# Patient Record
Sex: Female | Born: 1940 | Race: White | Hispanic: No | Smoking: Former smoker
Health system: Southern US, Community
[De-identification: ages and names within clinical notes are randomized; demographics above are authoritative.]

## PROBLEM LIST (undated history)

## (undated) DIAGNOSIS — L299 Pruritus, unspecified: Secondary | ICD-10-CM

## (undated) DIAGNOSIS — D649 Anemia, unspecified: Secondary | ICD-10-CM

## (undated) DIAGNOSIS — C3490 Malignant neoplasm of unspecified part of unspecified bronchus or lung: Secondary | ICD-10-CM

## (undated) DIAGNOSIS — E538 Deficiency of other specified B group vitamins: Secondary | ICD-10-CM

## (undated) DIAGNOSIS — C9110 Chronic lymphocytic leukemia of B-cell type not having achieved remission: Secondary | ICD-10-CM

## (undated) HISTORY — DX: Pruritus, unspecified: L29.9

## (undated) HISTORY — DX: Malignant neoplasm of unspecified part of unspecified bronchus or lung: C34.90

## (undated) HISTORY — DX: Anemia, unspecified: D64.9

## (undated) HISTORY — DX: Chronic lymphocytic leukemia of B-cell type not having achieved remission: C91.10

## (undated) HISTORY — DX: Deficiency of other specified B group vitamins: E53.8

---

## 1986-07-16 HISTORY — PX: OTHER SURGICAL HISTORY: SHX169

## 2000-07-16 DIAGNOSIS — C9110 Chronic lymphocytic leukemia of B-cell type not having achieved remission: Secondary | ICD-10-CM

## 2000-07-16 HISTORY — DX: Chronic lymphocytic leukemia of B-cell type not having achieved remission: C91.10

## 2004-04-25 ENCOUNTER — Ambulatory Visit: Payer: Self-pay | Admitting: Internal Medicine

## 2004-05-16 ENCOUNTER — Ambulatory Visit: Payer: Self-pay | Admitting: Internal Medicine

## 2004-08-10 ENCOUNTER — Ambulatory Visit: Payer: Self-pay | Admitting: Family Medicine

## 2004-10-25 ENCOUNTER — Ambulatory Visit: Payer: Self-pay | Admitting: Internal Medicine

## 2004-11-13 ENCOUNTER — Ambulatory Visit: Payer: Self-pay | Admitting: Internal Medicine

## 2005-04-25 ENCOUNTER — Ambulatory Visit: Payer: Self-pay | Admitting: Internal Medicine

## 2005-05-16 ENCOUNTER — Ambulatory Visit: Payer: Self-pay | Admitting: Internal Medicine

## 2005-10-11 ENCOUNTER — Ambulatory Visit: Payer: Self-pay | Admitting: Family Medicine

## 2005-10-31 ENCOUNTER — Ambulatory Visit: Payer: Self-pay | Admitting: Internal Medicine

## 2005-11-13 ENCOUNTER — Ambulatory Visit: Payer: Self-pay | Admitting: Internal Medicine

## 2006-02-05 ENCOUNTER — Ambulatory Visit: Payer: Self-pay | Admitting: Family Medicine

## 2006-04-29 ENCOUNTER — Ambulatory Visit: Payer: Self-pay | Admitting: Internal Medicine

## 2006-05-16 ENCOUNTER — Ambulatory Visit: Payer: Self-pay | Admitting: Internal Medicine

## 2006-10-23 ENCOUNTER — Ambulatory Visit: Payer: Self-pay | Admitting: Internal Medicine

## 2006-11-14 ENCOUNTER — Ambulatory Visit: Payer: Self-pay | Admitting: Internal Medicine

## 2007-01-31 ENCOUNTER — Ambulatory Visit: Payer: Self-pay | Admitting: Family Medicine

## 2007-04-21 ENCOUNTER — Ambulatory Visit: Payer: Self-pay | Admitting: Internal Medicine

## 2007-05-07 ENCOUNTER — Ambulatory Visit: Payer: Self-pay | Admitting: Family Medicine

## 2007-05-17 ENCOUNTER — Ambulatory Visit: Payer: Self-pay | Admitting: Internal Medicine

## 2007-07-29 ENCOUNTER — Ambulatory Visit: Payer: Self-pay | Admitting: Internal Medicine

## 2007-08-17 ENCOUNTER — Ambulatory Visit: Payer: Self-pay | Admitting: Internal Medicine

## 2007-10-15 ENCOUNTER — Ambulatory Visit: Payer: Self-pay | Admitting: Internal Medicine

## 2007-10-28 ENCOUNTER — Ambulatory Visit: Payer: Self-pay | Admitting: Internal Medicine

## 2007-11-14 ENCOUNTER — Ambulatory Visit: Payer: Self-pay | Admitting: Internal Medicine

## 2008-03-16 ENCOUNTER — Ambulatory Visit: Payer: Self-pay | Admitting: Internal Medicine

## 2008-03-25 ENCOUNTER — Ambulatory Visit: Payer: Self-pay | Admitting: Internal Medicine

## 2008-04-15 ENCOUNTER — Ambulatory Visit: Payer: Self-pay | Admitting: Internal Medicine

## 2008-05-16 ENCOUNTER — Ambulatory Visit: Payer: Self-pay | Admitting: Internal Medicine

## 2008-06-15 ENCOUNTER — Ambulatory Visit: Payer: Self-pay | Admitting: Internal Medicine

## 2008-07-16 ENCOUNTER — Ambulatory Visit: Payer: Self-pay | Admitting: Internal Medicine

## 2008-08-16 ENCOUNTER — Ambulatory Visit: Payer: Self-pay | Admitting: Internal Medicine

## 2008-09-13 ENCOUNTER — Ambulatory Visit: Payer: Self-pay | Admitting: Internal Medicine

## 2008-10-14 ENCOUNTER — Ambulatory Visit: Payer: Self-pay | Admitting: Internal Medicine

## 2008-11-13 ENCOUNTER — Ambulatory Visit: Payer: Self-pay | Admitting: Internal Medicine

## 2008-12-14 ENCOUNTER — Ambulatory Visit: Payer: Self-pay | Admitting: Internal Medicine

## 2009-01-13 ENCOUNTER — Ambulatory Visit: Payer: Self-pay | Admitting: Internal Medicine

## 2009-02-13 ENCOUNTER — Ambulatory Visit: Payer: Self-pay | Admitting: Internal Medicine

## 2009-03-16 ENCOUNTER — Ambulatory Visit: Payer: Self-pay | Admitting: Internal Medicine

## 2009-04-15 ENCOUNTER — Ambulatory Visit: Payer: Self-pay | Admitting: Internal Medicine

## 2009-05-16 ENCOUNTER — Ambulatory Visit: Payer: Self-pay | Admitting: Internal Medicine

## 2009-06-15 ENCOUNTER — Ambulatory Visit: Payer: Self-pay | Admitting: Internal Medicine

## 2009-07-16 ENCOUNTER — Ambulatory Visit: Payer: Self-pay | Admitting: Internal Medicine

## 2009-08-16 ENCOUNTER — Ambulatory Visit: Payer: Self-pay | Admitting: Internal Medicine

## 2009-09-13 ENCOUNTER — Ambulatory Visit: Payer: Self-pay | Admitting: Internal Medicine

## 2009-10-14 ENCOUNTER — Ambulatory Visit: Payer: Self-pay | Admitting: Internal Medicine

## 2009-11-13 ENCOUNTER — Ambulatory Visit: Payer: Self-pay | Admitting: Internal Medicine

## 2009-12-14 ENCOUNTER — Ambulatory Visit: Payer: Self-pay | Admitting: Internal Medicine

## 2010-01-13 ENCOUNTER — Ambulatory Visit: Payer: Self-pay | Admitting: Internal Medicine

## 2010-02-13 ENCOUNTER — Ambulatory Visit: Payer: Self-pay | Admitting: Internal Medicine

## 2010-03-16 ENCOUNTER — Ambulatory Visit: Payer: Self-pay | Admitting: Internal Medicine

## 2010-04-15 ENCOUNTER — Ambulatory Visit: Payer: Self-pay | Admitting: Internal Medicine

## 2010-05-16 ENCOUNTER — Ambulatory Visit: Payer: Self-pay | Admitting: Internal Medicine

## 2010-06-15 ENCOUNTER — Ambulatory Visit: Payer: Self-pay | Admitting: Internal Medicine

## 2010-07-16 ENCOUNTER — Ambulatory Visit: Payer: Self-pay | Admitting: Internal Medicine

## 2010-08-16 ENCOUNTER — Ambulatory Visit: Payer: Self-pay | Admitting: Internal Medicine

## 2010-09-14 ENCOUNTER — Ambulatory Visit: Payer: Self-pay | Admitting: Internal Medicine

## 2010-10-15 ENCOUNTER — Ambulatory Visit: Payer: Self-pay | Admitting: Internal Medicine

## 2010-11-14 ENCOUNTER — Ambulatory Visit: Payer: Self-pay | Admitting: Internal Medicine

## 2010-12-15 ENCOUNTER — Ambulatory Visit: Payer: Self-pay | Admitting: Internal Medicine

## 2011-01-14 ENCOUNTER — Ambulatory Visit: Payer: Self-pay | Admitting: Internal Medicine

## 2011-02-14 ENCOUNTER — Ambulatory Visit: Payer: Self-pay | Admitting: Internal Medicine

## 2011-03-17 ENCOUNTER — Ambulatory Visit: Payer: Self-pay | Admitting: Internal Medicine

## 2011-04-16 ENCOUNTER — Ambulatory Visit: Payer: Self-pay | Admitting: Internal Medicine

## 2011-05-17 ENCOUNTER — Ambulatory Visit: Payer: Self-pay | Admitting: Internal Medicine

## 2011-06-16 ENCOUNTER — Ambulatory Visit: Payer: Self-pay | Admitting: Internal Medicine

## 2011-07-17 ENCOUNTER — Ambulatory Visit: Payer: Self-pay | Admitting: Internal Medicine

## 2011-07-26 LAB — BASIC METABOLIC PANEL
Anion Gap: 5 — ABNORMAL LOW (ref 7–16)
BUN: 19 mg/dL — ABNORMAL HIGH (ref 7–18)
EGFR (African American): >60
EGFR (Non-African Amer.): >60
Glucose: 115 mg/dL — ABNORMAL HIGH (ref 65–99)
Osmolality: 286 (ref 275–301)
Potassium: 4.4 mmol/L (ref 3.5–5.1)

## 2011-07-26 LAB — CBC CANCER CENTER
Basophil #: 0 x10 3/mm (ref 0.0–0.1)
Eosinophil #: 0 x10 3/mm (ref 0.0–0.7)
Lymphocyte #: 4.5 x10 3/mm — ABNORMAL HIGH (ref 1.0–3.6)
Lymphocyte %: 74.4 %
Monocyte #: 0.3 x10 3/mm (ref 0.0–0.7)
Monocyte %: 4.5 %
Neutrophil #: 1.2 x10 3/mm — ABNORMAL LOW (ref 1.4–6.5)
Platelet: 92 x10 3/mm — ABNORMAL LOW (ref 150–440)
RDW: 14.9 % — ABNORMAL HIGH (ref 11.5–14.5)
WBC: 6 x10 3/mm (ref 3.6–11.0)

## 2011-08-09 LAB — CBC CANCER CENTER
Basophil #: 0 x10 3/mm (ref 0.0–0.1)
Basophil %: 0.8 %
HCT: 39.5 % (ref 35.0–47.0)
HGB: 12.9 g/dL (ref 12.0–16.0)
Lymphocyte #: 4.1 x10 3/mm — ABNORMAL HIGH (ref 1.0–3.6)
Lymphocyte %: 74.8 %
MCHC: 32.6 g/dL (ref 32.0–36.0)
Monocyte %: 4.8 %
Neutrophil #: 1 x10 3/mm — ABNORMAL LOW (ref 1.4–6.5)
Neutrophil %: 19 %
RBC: 3.82 10*6/uL (ref 3.80–5.20)
WBC: 5.4 x10 3/mm (ref 3.6–11.0)

## 2011-08-17 ENCOUNTER — Ambulatory Visit: Payer: Self-pay | Admitting: Internal Medicine

## 2011-08-23 LAB — CBC CANCER CENTER
Basophil %: 0.1 %
Eosinophil #: 0 x10 3/mm (ref 0.0–0.7)
Eosinophil %: 0.6 %
HCT: 40.2 % (ref 35.0–47.0)
Lymphocyte #: 3.1 x10 3/mm (ref 1.0–3.6)
MCH: 32.8 pg (ref 26.0–34.0)
MCV: 101.9 fL — ABNORMAL HIGH (ref 80–100)
Monocyte #: 0.3 x10 3/mm (ref 0.0–0.7)
Monocyte %: 9.3 %
Neutrophil #: 0.1 x10 3/mm — ABNORMAL LOW (ref 1.4–6.5)
Platelet: 98 x10 3/mm — ABNORMAL LOW (ref 150–440)
RBC: 3.94 10*6/uL (ref 3.80–5.20)
RDW: 12.6 % (ref 11.5–14.5)
WBC: 3.5 x10 3/mm — ABNORMAL LOW (ref 3.6–11.0)

## 2011-08-30 LAB — CBC CANCER CENTER
Basophil %: 0.3 %
Eosinophil %: 0.3 %
Lymphocyte #: 2.3 x10 3/mm (ref 1.0–3.6)
Lymphocyte %: 31.5 %
MCH: 32.8 pg (ref 26.0–34.0)
MCHC: 32.1 g/dL (ref 32.0–36.0)
MCV: 102 fL — ABNORMAL HIGH (ref 80–100)
Monocyte #: 0.3 x10 3/mm (ref 0.0–0.7)
Monocyte %: 3.7 %
Platelet: 98 x10 3/mm — ABNORMAL LOW (ref 150–440)
RDW: 12.6 % (ref 11.5–14.5)
WBC: 7.4 x10 3/mm (ref 3.6–11.0)

## 2011-09-06 LAB — CBC CANCER CENTER
Basophil #: 0 x10 3/mm (ref 0.0–0.1)
Eosinophil #: 0 x10 3/mm (ref 0.0–0.7)
Eosinophil %: 0.1 %
HCT: 38.7 % (ref 35.0–47.0)
HGB: 12.6 g/dL (ref 12.0–16.0)
Lymphocyte %: 61 %
MCHC: 32.7 g/dL (ref 32.0–36.0)
Monocyte #: 0.2 x10 3/mm (ref 0.0–0.7)
Monocyte %: 3.3 %
Neutrophil #: 1.9 x10 3/mm (ref 1.4–6.5)
Neutrophil %: 35.4 %
RBC: 3.88 10*6/uL (ref 3.80–5.20)
RDW: 12.4 % (ref 11.5–14.5)
WBC: 5.4 x10 3/mm (ref 3.6–11.0)

## 2011-09-14 ENCOUNTER — Ambulatory Visit: Payer: Self-pay | Admitting: Internal Medicine

## 2011-09-20 LAB — CBC CANCER CENTER
Basophil #: 0 x10 3/mm (ref 0.0–0.1)
Eosinophil #: 0.1 x10 3/mm (ref 0.0–0.7)
Lymphocyte #: 3.5 x10 3/mm (ref 1.0–3.6)
MCH: 32.6 pg (ref 26.0–34.0)
MCHC: 32.7 g/dL (ref 32.0–36.0)
MCV: 99.7 fL (ref 80–100)
Monocyte #: 0.4 x10 3/mm (ref 0.0–0.7)
Monocyte %: 6.3 %
Neutrophil #: 1.7 x10 3/mm (ref 1.4–6.5)
Neutrophil %: 29.6 %
Platelet: 111 x10 3/mm — ABNORMAL LOW (ref 150–440)
RBC: 3.94 10*6/uL (ref 3.80–5.20)
RDW: 13.4 % (ref 11.5–14.5)
WBC: 5.7 x10 3/mm (ref 3.6–11.0)

## 2011-10-04 LAB — BASIC METABOLIC PANEL
Anion Gap: 7 (ref 7–16)
BUN: 18 mg/dL (ref 7–18)
Calcium, Total: 9 mg/dL (ref 8.5–10.1)
Chloride: 105 mmol/L (ref 98–107)
Co2: 31 mmol/L (ref 21–32)
EGFR (African American): >60
EGFR (Non-African Amer.): >60
Osmolality: 287 (ref 275–301)
Potassium: 3.9 mmol/L (ref 3.5–5.1)
Sodium: 143 mmol/L (ref 136–145)

## 2011-10-04 LAB — CBC CANCER CENTER
Basophil #: 0 x10 3/mm (ref 0.0–0.1)
Basophil %: 0.3 %
Eosinophil #: 0.1 x10 3/mm (ref 0.0–0.7)
HGB: 13.2 g/dL (ref 12.0–16.0)
Lymphocyte #: 3.9 x10 3/mm — ABNORMAL HIGH (ref 1.0–3.6)
Lymphocyte %: 63.9 %
MCHC: 31.9 g/dL — ABNORMAL LOW (ref 32.0–36.0)
Neutrophil %: 30.8 %
RBC: 4.21 10*6/uL (ref 3.80–5.20)
WBC: 6.2 x10 3/mm (ref 3.6–11.0)

## 2011-10-04 LAB — HEPATIC FUNCTION PANEL A (ARMC)
Alkaline Phosphatase: 52 U/L (ref 50–136)
SGOT(AST): 17 U/L (ref 15–37)
Total Protein: 6.4 g/dL (ref 6.4–8.2)

## 2011-10-15 ENCOUNTER — Ambulatory Visit: Payer: Self-pay | Admitting: Internal Medicine

## 2011-10-18 LAB — CBC CANCER CENTER
Basophil %: 0.1 %
Lymphocyte #: 2.8 x10 3/mm (ref 1.0–3.6)
MCH: 32 pg (ref 26.0–34.0)
MCHC: 32.4 g/dL (ref 32.0–36.0)
MCV: 98.6 fL (ref 80–100)
Monocyte %: 5.5 %
RBC: 4.07 10*6/uL (ref 3.80–5.20)
RDW: 13.3 % (ref 11.5–14.5)

## 2011-11-14 ENCOUNTER — Ambulatory Visit: Payer: Self-pay | Admitting: Internal Medicine

## 2011-11-15 LAB — CBC CANCER CENTER
Basophil #: 0 x10 3/mm (ref 0.0–0.1)
Basophil %: 0.2 %
Eosinophil #: 0.1 x10 3/mm (ref 0.0–0.7)
HCT: 42.8 % (ref 35.0–47.0)
HGB: 13.9 g/dL (ref 12.0–16.0)
Lymphocyte #: 5.6 x10 3/mm — ABNORMAL HIGH (ref 1.0–3.6)
Lymphocyte %: 66.3 %
MCH: 32.1 pg (ref 26.0–34.0)
MCV: 99 fL (ref 80–100)
Monocyte #: 0.3 x10 3/mm (ref 0.2–0.9)
Neutrophil %: 29.4 %
Platelet: 104 x10 3/mm — ABNORMAL LOW (ref 150–440)
RDW: 14.2 % (ref 11.5–14.5)
WBC: 8.4 x10 3/mm (ref 3.6–11.0)

## 2011-12-13 LAB — BASIC METABOLIC PANEL
Anion Gap: 5 — ABNORMAL LOW (ref 7–16)
Chloride: 107 mmol/L (ref 98–107)
EGFR (Non-African Amer.): >60
Glucose: 93 mg/dL (ref 65–99)
Osmolality: 288 (ref 275–301)
Potassium: 5.2 mmol/L — ABNORMAL HIGH (ref 3.5–5.1)
Sodium: 144 mmol/L (ref 136–145)

## 2011-12-13 LAB — CBC CANCER CENTER
Basophil #: 0 x10 3/mm (ref 0.0–0.1)
Eosinophil #: 0.1 x10 3/mm (ref 0.0–0.7)
Eosinophil %: 1 %
HCT: 41.3 % (ref 35.0–47.0)
Lymphocyte #: 4.7 x10 3/mm — ABNORMAL HIGH (ref 1.0–3.6)
MCHC: 32.5 g/dL (ref 32.0–36.0)
MCV: 99 fL (ref 80–100)
WBC: 5.7 x10 3/mm (ref 3.6–11.0)

## 2011-12-13 LAB — HEPATIC FUNCTION PANEL A (ARMC)
Alkaline Phosphatase: 63 U/L (ref 50–136)
Bilirubin, Direct: 0.1 mg/dL (ref 0.00–0.20)
Bilirubin,Total: 0.6 mg/dL (ref 0.2–1.0)
SGOT(AST): 20 U/L (ref 15–37)
SGPT (ALT): 24 U/L
Total Protein: 6.6 g/dL (ref 6.4–8.2)

## 2011-12-15 ENCOUNTER — Ambulatory Visit: Payer: Self-pay | Admitting: Internal Medicine

## 2011-12-20 LAB — CBC CANCER CENTER
Basophil #: 0 x10 3/mm (ref 0.0–0.1)
Basophil %: 0.1 %
Eosinophil #: 0 x10 3/mm (ref 0.0–0.7)
HGB: 13.4 g/dL (ref 12.0–16.0)
Lymphocyte #: 3 x10 3/mm (ref 1.0–3.6)
Lymphocyte %: 75.1 %
MCH: 31.9 pg (ref 26.0–34.0)
MCHC: 32.7 g/dL (ref 32.0–36.0)
MCV: 98 fL (ref 80–100)
Monocyte %: 10.6 %
Neutrophil #: 0.5 x10 3/mm — ABNORMAL LOW (ref 1.4–6.5)
Platelet: 96 x10 3/mm — ABNORMAL LOW (ref 150–440)
RBC: 4.2 10*6/uL (ref 3.80–5.20)
WBC: 3.9 x10 3/mm (ref 3.6–11.0)

## 2011-12-27 LAB — CBC CANCER CENTER
Basophil #: 0 x10 3/mm (ref 0.0–0.1)
Basophil %: 0.1 %
Eosinophil %: 0.7 %
HGB: 13.5 g/dL (ref 12.0–16.0)
Lymphocyte %: 64.3 %
MCH: 32.1 pg (ref 26.0–34.0)
MCV: 98 fL (ref 80–100)
Monocyte %: 6 %
Neutrophil #: 1.6 x10 3/mm (ref 1.4–6.5)
Neutrophil %: 28.9 %
Platelet: 100 x10 3/mm — ABNORMAL LOW (ref 150–440)
RBC: 4.19 10*6/uL (ref 3.80–5.20)
RDW: 13.9 % (ref 11.5–14.5)
WBC: 5.7 x10 3/mm (ref 3.6–11.0)

## 2012-01-10 LAB — CBC CANCER CENTER
Basophil %: 0.2 %
Eosinophil %: 2.2 %
HCT: 42.5 % (ref 35.0–47.0)
HGB: 13.9 g/dL (ref 12.0–16.0)
Lymphocyte #: 4.7 x10 3/mm — ABNORMAL HIGH (ref 1.0–3.6)
Lymphocyte %: 70.7 %
MCH: 31.6 pg (ref 26.0–34.0)
MCV: 97 fL (ref 80–100)
Monocyte #: 0.3 x10 3/mm (ref 0.2–0.9)
Monocyte %: 4.3 %
Neutrophil #: 1.5 x10 3/mm (ref 1.4–6.5)
Neutrophil %: 22.6 %
Platelet: 111 x10 3/mm — ABNORMAL LOW (ref 150–440)
RBC: 4.39 10*6/uL (ref 3.80–5.20)
RDW: 13.9 % (ref 11.5–14.5)

## 2012-01-14 ENCOUNTER — Ambulatory Visit: Payer: Self-pay | Admitting: Internal Medicine

## 2012-02-07 LAB — CBC CANCER CENTER
Basophil #: 0 x10 3/mm (ref 0.0–0.1)
Basophil %: 0.1 %
HCT: 41.7 % (ref 35.0–47.0)
HGB: 13.8 g/dL (ref 12.0–16.0)
Lymphocyte #: 4.5 x10 3/mm — ABNORMAL HIGH (ref 1.0–3.6)
Lymphocyte %: 54 %
MCV: 98 fL (ref 80–100)
Monocyte %: 4.9 %
Neutrophil #: 3.2 x10 3/mm (ref 1.4–6.5)
Platelet: 118 x10 3/mm — ABNORMAL LOW (ref 150–440)
RBC: 4.26 10*6/uL (ref 3.80–5.20)
RDW: 14.7 % — ABNORMAL HIGH (ref 11.5–14.5)
WBC: 8.3 x10 3/mm (ref 3.6–11.0)

## 2012-02-14 ENCOUNTER — Ambulatory Visit: Payer: Self-pay | Admitting: Internal Medicine

## 2012-03-06 LAB — CBC CANCER CENTER
Basophil #: 0 "x10 3/mm "
Basophil %: 0.3 %
Eosinophil #: 0.1 "x10 3/mm "
Eosinophil %: 1.5 %
HCT: 38.7 %
HGB: 12.6 g/dL
Lymphocyte %: 59.5 %
Lymphs Abs: 4.3 "x10 3/mm " — ABNORMAL HIGH
MCH: 32.2 pg
MCHC: 32.5 g/dL
MCV: 99 fL
Monocyte #: 0.3 "x10 3/mm "
Monocyte %: 3.9 %
Neutrophil #: 2.5 "x10 3/mm "
Neutrophil %: 34.8 %
Platelet: 125 "x10 3/mm " — ABNORMAL LOW
RBC: 3.91 "x10 6/mm "
RDW: 14.4 %
WBC: 7.3 "x10 3/mm "

## 2012-03-06 LAB — BASIC METABOLIC PANEL WITH GFR
Anion Gap: 7
BUN: 20 mg/dL — ABNORMAL HIGH
Calcium, Total: 8.6 mg/dL
Chloride: 106 mmol/L
Co2: 28 mmol/L
Creatinine: 0.78 mg/dL
EGFR (African American): >60
EGFR (Non-African Amer.): >60
Glucose: 90 mg/dL
Osmolality: 283
Potassium: 4.2 mmol/L
Sodium: 141 mmol/L

## 2012-03-06 LAB — HEPATIC FUNCTION PANEL A (ARMC)
Albumin: 3.8 g/dL
Alkaline Phosphatase: 63 U/L
Bilirubin, Direct: 0.1 mg/dL
Bilirubin,Total: 0.4 mg/dL
SGOT(AST): 15 U/L
SGPT (ALT): 20 U/L
Total Protein: 6.4 g/dL

## 2012-03-16 ENCOUNTER — Ambulatory Visit: Payer: Self-pay | Admitting: Internal Medicine

## 2012-04-03 LAB — CBC CANCER CENTER
Basophil #: 0 x10 3/mm (ref 0.0–0.1)
Eosinophil #: 0.1 x10 3/mm (ref 0.0–0.7)
HCT: 41.3 % (ref 35.0–47.0)
Lymphocyte #: 4.8 x10 3/mm — ABNORMAL HIGH (ref 1.0–3.6)
MCH: 32.2 pg (ref 26.0–34.0)
MCHC: 32.3 g/dL (ref 32.0–36.0)
MCV: 100 fL (ref 80–100)
Monocyte %: 4.6 %
Neutrophil #: 2.2 x10 3/mm (ref 1.4–6.5)
RDW: 15.1 % — ABNORMAL HIGH (ref 11.5–14.5)
WBC: 7.4 x10 3/mm (ref 3.6–11.0)

## 2012-04-15 ENCOUNTER — Ambulatory Visit: Payer: Self-pay | Admitting: Internal Medicine

## 2012-05-15 LAB — CBC CANCER CENTER
Basophil #: 0 x10 3/mm (ref 0.0–0.1)
Basophil %: 0.3 %
Eosinophil #: 0.1 x10 3/mm (ref 0.0–0.7)
HCT: 39.6 % (ref 35.0–47.0)
HGB: 13.1 g/dL (ref 12.0–16.0)
Lymphocyte #: 4.9 x10 3/mm — ABNORMAL HIGH (ref 1.0–3.6)
MCH: 32.4 pg (ref 26.0–34.0)
MCHC: 32.9 g/dL (ref 32.0–36.0)
MCV: 98 fL (ref 80–100)
Monocyte #: 0.3 x10 3/mm (ref 0.2–0.9)
Neutrophil #: 2.1 x10 3/mm (ref 1.4–6.5)
Platelet: 109 x10 3/mm — ABNORMAL LOW (ref 150–440)
RBC: 4.03 10*6/uL (ref 3.80–5.20)
RDW: 14.1 % (ref 11.5–14.5)
WBC: 7.5 x10 3/mm (ref 3.6–11.0)

## 2012-05-16 ENCOUNTER — Ambulatory Visit: Payer: Self-pay | Admitting: Internal Medicine

## 2012-06-25 ENCOUNTER — Ambulatory Visit: Payer: Self-pay | Admitting: Internal Medicine

## 2012-06-26 LAB — CBC CANCER CENTER
Basophil %: 0.3 %
Eosinophil %: 0.6 %
HCT: 38.9 % (ref 35.0–47.0)
HGB: 12.8 g/dL (ref 12.0–16.0)
Lymphocyte #: 4.5 x10 3/mm — ABNORMAL HIGH (ref 1.0–3.6)
Lymphocyte %: 62.2 %
MCH: 31.6 pg (ref 26.0–34.0)
MCV: 96 fL (ref 80–100)
Monocyte #: 0.3 x10 3/mm (ref 0.2–0.9)
Monocyte %: 4.1 %
Neutrophil %: 32.8 %
RBC: 4.05 10*6/uL (ref 3.80–5.20)
RDW: 13.7 % (ref 11.5–14.5)

## 2012-06-26 LAB — BASIC METABOLIC PANEL
Anion Gap: 7 (ref 7–16)
BUN: 17 mg/dL (ref 7–18)
Calcium, Total: 8.8 mg/dL (ref 8.5–10.1)
Chloride: 108 mmol/L — ABNORMAL HIGH (ref 98–107)
Co2: 29 mmol/L (ref 21–32)
Creatinine: 0.8 mg/dL (ref 0.60–1.30)
EGFR (African American): >60
Osmolality: 289 (ref 275–301)
Potassium: 4.1 mmol/L (ref 3.5–5.1)

## 2012-06-26 LAB — HEPATIC FUNCTION PANEL A (ARMC)
Alkaline Phosphatase: 70 U/L (ref 50–136)
Bilirubin, Direct: 0.1 mg/dL (ref 0.00–0.20)
Bilirubin,Total: 0.5 mg/dL (ref 0.2–1.0)
SGOT(AST): 16 U/L (ref 15–37)

## 2012-07-16 ENCOUNTER — Ambulatory Visit: Payer: Self-pay | Admitting: Internal Medicine

## 2012-08-07 LAB — CBC CANCER CENTER
Basophil %: 0.2 %
Eosinophil #: 0 x10 3/mm (ref 0.0–0.7)
Lymphocyte #: 5.7 x10 3/mm — ABNORMAL HIGH (ref 1.0–3.6)
Lymphocyte %: 68.1 %
MCHC: 32.8 g/dL (ref 32.0–36.0)
MCV: 95 fL (ref 80–100)
Monocyte #: 0.3 x10 3/mm (ref 0.2–0.9)
Monocyte %: 3.5 %
Neutrophil #: 2.3 x10 3/mm (ref 1.4–6.5)
Platelet: 125 x10 3/mm — ABNORMAL LOW (ref 150–440)
RBC: 4.4 10*6/uL (ref 3.80–5.20)
RDW: 13.6 % (ref 11.5–14.5)
WBC: 8.4 x10 3/mm (ref 3.6–11.0)

## 2012-08-16 ENCOUNTER — Ambulatory Visit: Payer: Self-pay | Admitting: Internal Medicine

## 2012-09-18 ENCOUNTER — Ambulatory Visit: Payer: Self-pay | Admitting: Internal Medicine

## 2012-09-18 LAB — BASIC METABOLIC PANEL
Anion Gap: 9 (ref 7–16)
BUN: 15 mg/dL (ref 7–18)
Calcium, Total: 8.7 mg/dL (ref 8.5–10.1)
Chloride: 108 mmol/L — ABNORMAL HIGH (ref 98–107)
Co2: 28 mmol/L (ref 21–32)
Creatinine: 0.81 mg/dL (ref 0.60–1.30)
EGFR (African American): >60
Glucose: 102 mg/dL — ABNORMAL HIGH (ref 65–99)
Osmolality: 290 (ref 275–301)
Potassium: 4.2 mmol/L (ref 3.5–5.1)
Sodium: 145 mmol/L (ref 136–145)

## 2012-09-18 LAB — CBC CANCER CENTER
Eosinophil #: 0 x10 3/mm (ref 0.0–0.7)
Eosinophil %: 0.6 %
HCT: 39.5 % (ref 35.0–47.0)
HGB: 13.4 g/dL (ref 12.0–16.0)
Lymphocyte #: 4 x10 3/mm — ABNORMAL HIGH (ref 1.0–3.6)
Lymphocyte %: 62.7 %
MCH: 32.5 pg (ref 26.0–34.0)
MCHC: 33.8 g/dL (ref 32.0–36.0)
MCV: 96 fL (ref 80–100)
Neutrophil #: 2.1 x10 3/mm (ref 1.4–6.5)
Neutrophil %: 32.3 %
RBC: 4.11 10*6/uL (ref 3.80–5.20)
RDW: 13.7 % (ref 11.5–14.5)
WBC: 6.4 x10 3/mm (ref 3.6–11.0)

## 2012-09-18 LAB — HEPATIC FUNCTION PANEL A (ARMC)
Alkaline Phosphatase: 57 U/L (ref 50–136)
Bilirubin, Direct: 0.1 mg/dL (ref 0.00–0.20)
Total Protein: 6.1 g/dL — ABNORMAL LOW (ref 6.4–8.2)

## 2012-10-14 ENCOUNTER — Ambulatory Visit: Payer: Self-pay | Admitting: Internal Medicine

## 2012-11-13 ENCOUNTER — Ambulatory Visit: Payer: Self-pay | Admitting: Internal Medicine

## 2012-11-13 LAB — CBC CANCER CENTER
Basophil #: 0 x10 3/mm (ref 0.0–0.1)
Eosinophil #: 0 x10 3/mm (ref 0.0–0.7)
Eosinophil %: 0.6 %
Lymphocyte #: 4.1 x10 3/mm — ABNORMAL HIGH (ref 1.0–3.6)
MCHC: 32.5 g/dL (ref 32.0–36.0)
MCV: 94 fL (ref 80–100)
Monocyte %: 4.6 %
Neutrophil #: 2.7 x10 3/mm (ref 1.4–6.5)
Neutrophil %: 37.4 %
RBC: 4.41 10*6/uL (ref 3.80–5.20)

## 2012-12-14 ENCOUNTER — Ambulatory Visit: Payer: Self-pay | Admitting: Internal Medicine

## 2012-12-18 LAB — CBC CANCER CENTER
Basophil #: 0.1 x10 3/mm (ref 0.0–0.1)
Basophil %: 1.5 %
Eosinophil #: 0.1 x10 3/mm (ref 0.0–0.7)
HCT: 39.8 % (ref 35.0–47.0)
HGB: 13 g/dL (ref 12.0–16.0)
Lymphocyte %: 51 %
MCH: 30.7 pg (ref 26.0–34.0)
MCHC: 32.5 g/dL (ref 32.0–36.0)
MCV: 94 fL (ref 80–100)
Monocyte #: 0.4 x10 3/mm (ref 0.2–0.9)
Neutrophil #: 3.5 x10 3/mm (ref 1.4–6.5)
Platelet: 114 x10 3/mm — ABNORMAL LOW (ref 150–440)
RBC: 4.22 10*6/uL (ref 3.80–5.20)
RDW: 13.7 % (ref 11.5–14.5)
WBC: 8.3 x10 3/mm (ref 3.6–11.0)

## 2012-12-18 LAB — HEPATIC FUNCTION PANEL A (ARMC)
Alkaline Phosphatase: 56 U/L (ref 50–136)
Bilirubin,Total: 0.4 mg/dL (ref 0.2–1.0)
SGOT(AST): 19 U/L (ref 15–37)
SGPT (ALT): 26 U/L (ref 12–78)

## 2012-12-18 LAB — CREATININE, SERUM: Creatinine: 0.73 mg/dL (ref 0.60–1.30)

## 2013-01-13 ENCOUNTER — Ambulatory Visit: Payer: Self-pay | Admitting: Internal Medicine

## 2013-01-29 LAB — CBC CANCER CENTER
Basophil %: 0.3 %
Eosinophil #: 0.1 x10 3/mm (ref 0.0–0.7)
Eosinophil %: 1.5 %
HGB: 14.1 g/dL (ref 12.0–16.0)
Lymphocyte #: 3.3 x10 3/mm (ref 1.0–3.6)
Lymphocyte %: 55.1 %
MCH: 31.1 pg (ref 26.0–34.0)
MCHC: 33.1 g/dL (ref 32.0–36.0)
MCV: 94 fL (ref 80–100)
Monocyte %: 6.4 %
Neutrophil #: 2.2 x10 3/mm (ref 1.4–6.5)
Neutrophil %: 36.7 %
Platelet: 127 x10 3/mm — ABNORMAL LOW (ref 150–440)
RBC: 4.53 10*6/uL (ref 3.80–5.20)
WBC: 6 x10 3/mm (ref 3.6–11.0)

## 2013-02-13 ENCOUNTER — Ambulatory Visit: Payer: Self-pay | Admitting: Internal Medicine

## 2013-03-12 LAB — HEPATIC FUNCTION PANEL A (ARMC)
Albumin: 3.5 g/dL (ref 3.4–5.0)
Alkaline Phosphatase: 63 U/L (ref 50–136)
Bilirubin, Direct: 0.1 mg/dL (ref 0.00–0.20)
Bilirubin,Total: 0.4 mg/dL (ref 0.2–1.0)
SGOT(AST): 15 U/L (ref 15–37)
SGPT (ALT): 22 U/L (ref 12–78)
Total Protein: 6.6 g/dL (ref 6.4–8.2)

## 2013-03-12 LAB — CREATININE, SERUM
Creatinine: 0.76 mg/dL (ref 0.60–1.30)
EGFR (African American): >60
EGFR (Non-African Amer.): >60

## 2013-03-12 LAB — CBC CANCER CENTER
Basophil #: 0 x10 3/mm (ref 0.0–0.1)
Basophil %: 0.1 %
Eosinophil %: 1.5 %
HCT: 35.4 % (ref 35.0–47.0)
Lymphocyte #: 2.2 x10 3/mm (ref 1.0–3.6)
Lymphocyte %: 68.1 %
MCH: 30.6 pg (ref 26.0–34.0)
Monocyte #: 0.3 x10 3/mm (ref 0.2–0.9)
Monocyte %: 9.8 %
Neutrophil #: 0.7 x10 3/mm — ABNORMAL LOW (ref 1.4–6.5)
Neutrophil %: 20.5 %
RBC: 3.8 10*6/uL (ref 3.80–5.20)
RDW: 13.6 % (ref 11.5–14.5)
WBC: 3.3 x10 3/mm — ABNORMAL LOW (ref 3.6–11.0)

## 2013-03-16 ENCOUNTER — Ambulatory Visit: Payer: Self-pay | Admitting: Internal Medicine

## 2013-03-17 LAB — CBC CANCER CENTER
Basophil #: 0 x10 3/mm (ref 0.0–0.1)
Eosinophil %: 0.2 %
HCT: 38.6 % (ref 35.0–47.0)
HGB: 12.6 g/dL (ref 12.0–16.0)
Lymphocyte #: 1.6 x10 3/mm (ref 1.0–3.6)
Lymphocyte %: 46.7 %
MCHC: 32.7 g/dL (ref 32.0–36.0)
Monocyte %: 23.7 %
Neutrophil #: 1 x10 3/mm — ABNORMAL LOW (ref 1.4–6.5)
Neutrophil %: 29.2 %
WBC: 3.4 x10 3/mm — ABNORMAL LOW (ref 3.6–11.0)

## 2013-03-17 LAB — URINALYSIS, COMPLETE
Bilirubin,UR: NEGATIVE
Glucose,UR: NEGATIVE mg/dL (ref 0–75)
Ketone: NEGATIVE
Nitrite: POSITIVE
Specific Gravity: 1.02 (ref 1.003–1.030)

## 2013-03-18 ENCOUNTER — Inpatient Hospital Stay: Payer: Self-pay | Admitting: Internal Medicine

## 2013-03-18 LAB — COMPREHENSIVE METABOLIC PANEL
Albumin: 3.1 g/dL — ABNORMAL LOW (ref 3.4–5.0)
Alkaline Phosphatase: 70 U/L (ref 50–136)
Anion Gap: 5 — ABNORMAL LOW (ref 7–16)
Chloride: 98 mmol/L (ref 98–107)
Co2: 28 mmol/L (ref 21–32)
Creatinine: 0.74 mg/dL (ref 0.60–1.30)
EGFR (Non-African Amer.): >60
Glucose: 114 mg/dL — ABNORMAL HIGH (ref 65–99)
Sodium: 131 mmol/L — ABNORMAL LOW (ref 136–145)

## 2013-03-18 LAB — CBC WITH DIFFERENTIAL/PLATELET
Basophil %: 0.1 %
Eosinophil #: 0 10*3/uL (ref 0.0–0.7)
HCT: 35.6 % (ref 35.0–47.0)
HGB: 12.1 g/dL (ref 12.0–16.0)
Lymphocyte #: 1.4 10*3/uL (ref 1.0–3.6)
Lymphocyte %: 19.1 %
MCHC: 33.9 g/dL (ref 32.0–36.0)
MCV: 91 fL (ref 80–100)
Monocyte #: 0.5 x10 3/mm (ref 0.2–0.9)
Monocyte %: 6.1 %
Neutrophil %: 74.7 %
Platelet: 141 10*3/uL — ABNORMAL LOW (ref 150–440)
RBC: 3.92 10*6/uL (ref 3.80–5.20)
WBC: 7.5 10*3/uL (ref 3.6–11.0)

## 2013-03-19 LAB — CBC WITH DIFFERENTIAL/PLATELET
Basophil #: 0 10*3/uL (ref 0.0–0.1)
Eosinophil #: 0 10*3/uL (ref 0.0–0.7)
Eosinophil %: 0 %
HCT: 31.5 % — ABNORMAL LOW (ref 35.0–47.0)
HGB: 10.5 g/dL — ABNORMAL LOW (ref 12.0–16.0)
Lymphocyte #: 0.8 10*3/uL — ABNORMAL LOW (ref 1.0–3.6)
Lymphocyte %: 21.1 %
MCH: 30.5 pg (ref 26.0–34.0)
MCV: 92 fL (ref 80–100)
Monocyte #: 0.3 x10 3/mm (ref 0.2–0.9)
Monocyte %: 8.4 %
Neutrophil #: 2.8 10*3/uL (ref 1.4–6.5)
Platelet: 93 10*3/uL — ABNORMAL LOW (ref 150–440)
WBC: 4 10*3/uL (ref 3.6–11.0)

## 2013-03-19 LAB — BASIC METABOLIC PANEL
Anion Gap: 4 — ABNORMAL LOW (ref 7–16)
BUN: 10 mg/dL (ref 7–18)
Co2: 27 mmol/L (ref 21–32)
Creatinine: 0.74 mg/dL (ref 0.60–1.30)
Osmolality: 270 (ref 275–301)

## 2013-03-19 LAB — URINE CULTURE

## 2013-03-20 LAB — PROTIME-INR
INR: 1.2
Prothrombin Time: 15.1 secs — ABNORMAL HIGH (ref 11.5–14.7)

## 2013-03-20 LAB — CBC WITH DIFFERENTIAL/PLATELET
Basophil #: 0 10*3/uL (ref 0.0–0.1)
Basophil %: 0.1 %
Eosinophil #: 0 10*3/uL (ref 0.0–0.7)
Eosinophil %: 0.6 %
HCT: 29.8 % — ABNORMAL LOW (ref 35.0–47.0)
Lymphocyte %: 36.6 %
MCH: 30.7 pg (ref 26.0–34.0)
MCHC: 33.9 g/dL (ref 32.0–36.0)
MCV: 91 fL (ref 80–100)
Monocyte #: 0.3 x10 3/mm (ref 0.2–0.9)
Monocyte %: 11.7 %
Neutrophil #: 1.4 10*3/uL (ref 1.4–6.5)
Neutrophil %: 51 %
Platelet: 100 10*3/uL — ABNORMAL LOW (ref 150–440)
RBC: 3.28 10*6/uL — ABNORMAL LOW (ref 3.80–5.20)
RDW: 13.6 % (ref 11.5–14.5)

## 2013-03-20 LAB — FIBRINOGEN: Fibrinogen: 690 mg/dL — ABNORMAL HIGH (ref 210–470)

## 2013-03-20 LAB — APTT: Activated PTT: 45.6 secs — ABNORMAL HIGH (ref 23.6–35.9)

## 2013-03-22 LAB — CULTURE, BLOOD (SINGLE)

## 2013-03-25 LAB — CULTURE, BLOOD (SINGLE)

## 2013-03-26 LAB — BASIC METABOLIC PANEL
Co2: 28 mmol/L (ref 21–32)
Creatinine: 0.64 mg/dL (ref 0.60–1.30)
EGFR (African American): >60
EGFR (Non-African Amer.): >60
Osmolality: 286 (ref 275–301)
Potassium: 4 mmol/L (ref 3.5–5.1)
Sodium: 144 mmol/L (ref 136–145)

## 2013-03-26 LAB — CBC CANCER CENTER
Basophil #: 0 x10 3/mm (ref 0.0–0.1)
Basophil %: 0.2 %
Eosinophil #: 0.1 x10 3/mm (ref 0.0–0.7)
Lymphocyte #: 2 x10 3/mm (ref 1.0–3.6)
MCV: 92 fL (ref 80–100)
Monocyte #: 0.3 x10 3/mm (ref 0.2–0.9)
Monocyte %: 10.1 %
Neutrophil #: 0.9 x10 3/mm — ABNORMAL LOW (ref 1.4–6.5)
Neutrophil %: 26.7 %
RDW: 13.7 % (ref 11.5–14.5)
WBC: 3.3 x10 3/mm — ABNORMAL LOW (ref 3.6–11.0)

## 2013-03-30 LAB — CBC CANCER CENTER
Basophil #: 0 x10 3/mm (ref 0.0–0.1)
Basophil %: 0.1 %
Eosinophil #: 0 x10 3/mm (ref 0.0–0.7)
HCT: 39.2 % (ref 35.0–47.0)
HGB: 12.7 g/dL (ref 12.0–16.0)
Lymphocyte #: 1.8 x10 3/mm (ref 1.0–3.6)
Lymphocyte %: 59.9 %
MCH: 29.9 pg (ref 26.0–34.0)
MCV: 92 fL (ref 80–100)
Monocyte #: 1 x10 3/mm — ABNORMAL HIGH (ref 0.2–0.9)
RDW: 14.5 % (ref 11.5–14.5)

## 2013-04-02 LAB — CBC CANCER CENTER
Basophil #: 0 x10 3/mm (ref 0.0–0.1)
Basophil %: 0.2 %
Lymphocyte #: 3.3 x10 3/mm (ref 1.0–3.6)
Lymphocyte %: 17.4 %
MCH: 29.3 pg (ref 26.0–34.0)
MCHC: 32 g/dL (ref 32.0–36.0)
MCV: 92 fL (ref 80–100)
Monocyte #: 0.7 x10 3/mm (ref 0.2–0.9)
Neutrophil %: 78.5 %
Platelet: 183 x10 3/mm (ref 150–440)
RDW: 14.5 % (ref 11.5–14.5)
WBC: 19 x10 3/mm — ABNORMAL HIGH (ref 3.6–11.0)

## 2013-04-06 LAB — CBC CANCER CENTER
Eosinophil #: 0 x10 3/mm (ref 0.0–0.7)
Eosinophil %: 0.6 %
HCT: 38.8 % (ref 35.0–47.0)
HGB: 12.5 g/dL (ref 12.0–16.0)
Lymphocyte #: 3.2 x10 3/mm (ref 1.0–3.6)
Lymphocyte %: 48 %
MCH: 29.5 pg (ref 26.0–34.0)
MCV: 92 fL (ref 80–100)
Monocyte #: 0.4 x10 3/mm (ref 0.2–0.9)
Monocyte %: 6.8 %
Neutrophil %: 44.4 %
RBC: 4.22 10*6/uL (ref 3.80–5.20)
RDW: 14.2 % (ref 11.5–14.5)
WBC: 6.6 x10 3/mm (ref 3.6–11.0)

## 2013-04-09 LAB — CBC CANCER CENTER
Basophil %: 0.4 %
HCT: 37.2 % (ref 35.0–47.0)
HGB: 12.3 g/dL (ref 12.0–16.0)
Lymphocyte #: 2.2 x10 3/mm (ref 1.0–3.6)
Lymphocyte %: 40.2 %
MCH: 30.2 pg (ref 26.0–34.0)
MCHC: 33.1 g/dL (ref 32.0–36.0)
Monocyte #: 0.2 x10 3/mm (ref 0.2–0.9)

## 2013-04-09 LAB — CREATININE, SERUM: EGFR (African American): >60

## 2013-04-15 ENCOUNTER — Ambulatory Visit: Payer: Self-pay | Admitting: Internal Medicine

## 2013-04-23 LAB — CBC CANCER CENTER
Basophil #: 0 x10 3/mm (ref 0.0–0.1)
Eosinophil %: 1.4 %
HCT: 40.5 % (ref 35.0–47.0)
HGB: 13 g/dL (ref 12.0–16.0)
MCH: 30.3 pg (ref 26.0–34.0)
MCV: 94 fL (ref 80–100)
Neutrophil #: 2.6 x10 3/mm (ref 1.4–6.5)
Neutrophil %: 47.3 %
Platelet: 134 x10 3/mm — ABNORMAL LOW (ref 150–440)
RBC: 4.3 10*6/uL (ref 3.80–5.20)

## 2013-05-07 LAB — CBC CANCER CENTER
Basophil %: 0.4 %
Eosinophil %: 1.4 %
HCT: 40 % (ref 35.0–47.0)
HGB: 12.9 g/dL (ref 12.0–16.0)
Lymphocyte %: 48.4 %
MCV: 94 fL (ref 80–100)
Monocyte %: 6.1 %
Neutrophil #: 2.2 x10 3/mm (ref 1.4–6.5)
Neutrophil %: 43.7 %
Platelet: 116 x10 3/mm — ABNORMAL LOW (ref 150–440)
RBC: 4.25 10*6/uL (ref 3.80–5.20)
WBC: 5 x10 3/mm (ref 3.6–11.0)

## 2013-05-16 ENCOUNTER — Ambulatory Visit: Payer: Self-pay | Admitting: Internal Medicine

## 2013-05-21 LAB — CBC CANCER CENTER
Basophil #: 0 x10 3/mm (ref 0.0–0.1)
Basophil %: 0.2 %
Eosinophil #: 0 x10 3/mm (ref 0.0–0.7)
HCT: 41.3 % (ref 35.0–47.0)
HGB: 13.4 g/dL (ref 12.0–16.0)
Lymphocyte #: 2.9 x10 3/mm (ref 1.0–3.6)
MCH: 30.4 pg (ref 26.0–34.0)
MCHC: 32.3 g/dL (ref 32.0–36.0)
Monocyte #: 0.3 x10 3/mm (ref 0.2–0.9)
WBC: 5 x10 3/mm (ref 3.6–11.0)

## 2013-06-04 LAB — CBC CANCER CENTER
Basophil #: 0 x10 3/mm (ref 0.0–0.1)
Basophil %: 0.2 %
Eosinophil %: 0.7 %
Lymphocyte #: 2.5 x10 3/mm (ref 1.0–3.6)
MCHC: 32.2 g/dL (ref 32.0–36.0)
Neutrophil %: 42.9 %
Platelet: 118 x10 3/mm — ABNORMAL LOW (ref 150–440)
RBC: 4.33 10*6/uL (ref 3.80–5.20)
RDW: 15.2 % — ABNORMAL HIGH (ref 11.5–14.5)
WBC: 4.9 x10 3/mm (ref 3.6–11.0)

## 2013-06-15 ENCOUNTER — Ambulatory Visit: Payer: Self-pay | Admitting: Internal Medicine

## 2013-06-18 LAB — CBC CANCER CENTER
Basophil #: 0 x10 3/mm (ref 0.0–0.1)
HCT: 41.5 % (ref 35.0–47.0)
HGB: 13.5 g/dL (ref 12.0–16.0)
Lymphocyte %: 55.8 %
MCH: 30.8 pg (ref 26.0–34.0)
MCHC: 32.4 g/dL (ref 32.0–36.0)
Monocyte #: 0.3 x10 3/mm (ref 0.2–0.9)
Monocyte %: 4.3 %
Neutrophil #: 3.1 x10 3/mm (ref 1.4–6.5)
Neutrophil %: 39 %
Platelet: 129 x10 3/mm — ABNORMAL LOW (ref 150–440)
RDW: 14.9 % — ABNORMAL HIGH (ref 11.5–14.5)
WBC: 7.9 x10 3/mm (ref 3.6–11.0)

## 2013-07-02 LAB — HEPATIC FUNCTION PANEL A (ARMC)
Albumin: 4.1 g/dL (ref 3.4–5.0)
Alkaline Phosphatase: 58 U/L
SGOT(AST): 22 U/L (ref 15–37)
SGPT (ALT): 30 U/L (ref 12–78)
Total Protein: 6.9 g/dL (ref 6.4–8.2)

## 2013-07-02 LAB — LACTATE DEHYDROGENASE: LDH: 195 U/L (ref 81–246)

## 2013-07-02 LAB — CBC CANCER CENTER
Basophil #: 0 x10 3/mm (ref 0.0–0.1)
Basophil %: 0.2 %
Eosinophil #: 0 x10 3/mm (ref 0.0–0.7)
Lymphocyte %: 54.3 %
MCHC: 32.3 g/dL (ref 32.0–36.0)
Monocyte #: 0.3 x10 3/mm (ref 0.2–0.9)
Neutrophil #: 2.1 x10 3/mm (ref 1.4–6.5)
Platelet: 106 x10 3/mm — ABNORMAL LOW (ref 150–440)
RBC: 4.54 10*6/uL (ref 3.80–5.20)
RDW: 14.5 % (ref 11.5–14.5)

## 2013-07-02 LAB — CREATININE, SERUM: EGFR (Non-African Amer.): >60

## 2013-07-16 ENCOUNTER — Ambulatory Visit: Payer: Self-pay | Admitting: Internal Medicine

## 2013-07-16 ENCOUNTER — Ambulatory Visit: Payer: Self-pay | Admitting: Family Medicine

## 2013-08-13 LAB — CBC CANCER CENTER
BASOS PCT: 0.3 %
Basophil #: 0 x10 3/mm (ref 0.0–0.1)
EOS PCT: 0.6 %
Eosinophil #: 0.1 x10 3/mm (ref 0.0–0.7)
HCT: 42.5 % (ref 35.0–47.0)
HGB: 13.8 g/dL (ref 12.0–16.0)
LYMPHS ABS: 5.5 x10 3/mm — AB (ref 1.0–3.6)
LYMPHS PCT: 63.7 %
MCH: 30.7 pg (ref 26.0–34.0)
MCHC: 32.4 g/dL (ref 32.0–36.0)
MCV: 95 fL (ref 80–100)
MONOS PCT: 4.2 %
Monocyte #: 0.4 x10 3/mm (ref 0.2–0.9)
NEUTROS ABS: 2.7 x10 3/mm (ref 1.4–6.5)
Neutrophil %: 31.2 %
PLATELETS: 124 x10 3/mm — AB (ref 150–440)
RBC: 4.5 10*6/uL (ref 3.80–5.20)
RDW: 14.1 % (ref 11.5–14.5)
WBC: 8.6 x10 3/mm (ref 3.6–11.0)

## 2013-08-16 ENCOUNTER — Ambulatory Visit: Payer: Self-pay | Admitting: Internal Medicine

## 2013-09-24 ENCOUNTER — Ambulatory Visit: Payer: Self-pay | Admitting: Internal Medicine

## 2013-09-24 LAB — HEPATIC FUNCTION PANEL A (ARMC)
Albumin: 3.9 g/dL (ref 3.4–5.0)
Alkaline Phosphatase: 58 U/L
BILIRUBIN TOTAL: 0.4 mg/dL (ref 0.2–1.0)
Bilirubin, Direct: 0.1 mg/dL (ref 0.00–0.20)
SGOT(AST): 16 U/L (ref 15–37)
SGPT (ALT): 25 U/L (ref 12–78)
TOTAL PROTEIN: 6.5 g/dL (ref 6.4–8.2)

## 2013-09-24 LAB — CBC CANCER CENTER
Basophil #: 0 x10 3/mm (ref 0.0–0.1)
Basophil %: 0.2 %
EOS ABS: 0.1 x10 3/mm (ref 0.0–0.7)
Eosinophil %: 0.7 %
HCT: 42.7 % (ref 35.0–47.0)
HGB: 13.8 g/dL (ref 12.0–16.0)
Lymphocyte #: 5.5 x10 3/mm — ABNORMAL HIGH (ref 1.0–3.6)
Lymphocyte %: 66.8 %
MCH: 30.9 pg (ref 26.0–34.0)
MCHC: 32.3 g/dL (ref 32.0–36.0)
MCV: 96 fL (ref 80–100)
Monocyte #: 0.3 x10 3/mm (ref 0.2–0.9)
Monocyte %: 3.9 %
NEUTROS ABS: 2.3 x10 3/mm (ref 1.4–6.5)
Neutrophil %: 28.4 %
PLATELETS: 124 x10 3/mm — AB (ref 150–440)
RBC: 4.46 10*6/uL (ref 3.80–5.20)
RDW: 14.1 % (ref 11.5–14.5)
WBC: 8.2 x10 3/mm (ref 3.6–11.0)

## 2013-09-24 LAB — CREATININE, SERUM
CREATININE: 0.69 mg/dL (ref 0.60–1.30)
EGFR (African American): >60
EGFR (Non-African Amer.): >60

## 2013-09-24 LAB — LACTATE DEHYDROGENASE: LDH: 183 U/L (ref 81–246)

## 2013-10-14 ENCOUNTER — Ambulatory Visit: Payer: Self-pay | Admitting: Internal Medicine

## 2013-11-05 LAB — CBC CANCER CENTER
BASOS ABS: 0 x10 3/mm (ref 0.0–0.1)
BASOS PCT: 0.1 %
EOS ABS: 0 x10 3/mm (ref 0.0–0.7)
Eosinophil %: 0.4 %
HCT: 44.2 % (ref 35.0–47.0)
HGB: 14.2 g/dL (ref 12.0–16.0)
LYMPHS PCT: 73.3 %
Lymphocyte #: 8.3 x10 3/mm — ABNORMAL HIGH (ref 1.0–3.6)
MCH: 30.5 pg (ref 26.0–34.0)
MCHC: 32.1 g/dL (ref 32.0–36.0)
MCV: 95 fL (ref 80–100)
MONO ABS: 0.4 x10 3/mm (ref 0.2–0.9)
Monocyte %: 3.6 %
NEUTROS ABS: 2.6 x10 3/mm (ref 1.4–6.5)
Neutrophil %: 22.6 %
PLATELETS: 127 x10 3/mm — AB (ref 150–440)
RBC: 4.66 10*6/uL (ref 3.80–5.20)
RDW: 13.6 % (ref 11.5–14.5)
WBC: 11.4 x10 3/mm — AB (ref 3.6–11.0)

## 2013-11-13 ENCOUNTER — Ambulatory Visit: Payer: Self-pay | Admitting: Internal Medicine

## 2013-12-17 ENCOUNTER — Ambulatory Visit: Payer: Self-pay | Admitting: Internal Medicine

## 2013-12-17 LAB — CBC CANCER CENTER
Basophil #: 0 x10 3/mm (ref 0.0–0.1)
Basophil %: 0.3 %
Eosinophil #: 0.1 x10 3/mm (ref 0.0–0.7)
Eosinophil %: 0.7 %
HCT: 44.2 % (ref 35.0–47.0)
HGB: 14 g/dL (ref 12.0–16.0)
LYMPHS ABS: 7.1 x10 3/mm — AB (ref 1.0–3.6)
Lymphocyte %: 69.4 %
MCH: 30.1 pg (ref 26.0–34.0)
MCHC: 31.7 g/dL — AB (ref 32.0–36.0)
MCV: 95 fL (ref 80–100)
MONO ABS: 0.3 x10 3/mm (ref 0.2–0.9)
Monocyte %: 3.4 %
Neutrophil #: 2.7 x10 3/mm (ref 1.4–6.5)
Neutrophil %: 26.2 %
Platelet: 136 x10 3/mm — ABNORMAL LOW (ref 150–440)
RBC: 4.66 10*6/uL (ref 3.80–5.20)
RDW: 13.6 % (ref 11.5–14.5)
WBC: 10.2 x10 3/mm (ref 3.6–11.0)

## 2013-12-17 LAB — HEPATIC FUNCTION PANEL A (ARMC)
ALT: 26 U/L (ref 12–78)
AST: 19 U/L (ref 15–37)
Albumin: 4 g/dL (ref 3.4–5.0)
Alkaline Phosphatase: 63 U/L
Bilirubin, Direct: 0.1 mg/dL (ref 0.00–0.20)
Bilirubin,Total: 0.4 mg/dL (ref 0.2–1.0)
Total Protein: 6.6 g/dL (ref 6.4–8.2)

## 2013-12-17 LAB — LACTATE DEHYDROGENASE: LDH: 182 U/L (ref 81–246)

## 2013-12-17 LAB — CREATININE, SERUM: CREATININE: 0.64 mg/dL (ref 0.60–1.30)

## 2014-01-13 ENCOUNTER — Ambulatory Visit: Payer: Self-pay | Admitting: Internal Medicine

## 2014-03-11 ENCOUNTER — Ambulatory Visit: Payer: Self-pay | Admitting: Internal Medicine

## 2014-03-11 LAB — CBC CANCER CENTER
BASOS ABS: 0 x10 3/mm (ref 0.0–0.1)
Basophil %: 0.1 %
Eosinophil #: 0.1 x10 3/mm (ref 0.0–0.7)
Eosinophil %: 0.9 %
HCT: 41.5 % (ref 35.0–47.0)
HGB: 13.3 g/dL (ref 12.0–16.0)
LYMPHS ABS: 11.3 x10 3/mm — AB (ref 1.0–3.6)
LYMPHS PCT: 71.6 %
MCH: 30.5 pg (ref 26.0–34.0)
MCHC: 32.1 g/dL (ref 32.0–36.0)
MCV: 95 fL (ref 80–100)
MONO ABS: 0.5 x10 3/mm (ref 0.2–0.9)
MONOS PCT: 3.5 %
NEUTROS ABS: 3.8 x10 3/mm (ref 1.4–6.5)
Neutrophil %: 23.9 %
Platelet: 136 x10 3/mm — ABNORMAL LOW (ref 150–440)
RBC: 4.37 10*6/uL (ref 3.80–5.20)
RDW: 14 % (ref 11.5–14.5)
WBC: 15.7 x10 3/mm — ABNORMAL HIGH (ref 3.6–11.0)

## 2014-03-16 ENCOUNTER — Ambulatory Visit: Payer: Self-pay | Admitting: Internal Medicine

## 2014-06-03 ENCOUNTER — Ambulatory Visit: Payer: Self-pay | Admitting: Internal Medicine

## 2014-06-03 LAB — CBC CANCER CENTER
BASOS ABS: 0.1 x10 3/mm (ref 0.0–0.1)
BASOS PCT: 0.7 %
EOS PCT: 0.4 %
Eosinophil #: 0.1 x10 3/mm (ref 0.0–0.7)
HCT: 43.2 % (ref 35.0–47.0)
HGB: 13.8 g/dL (ref 12.0–16.0)
Lymphocyte #: 15.6 x10 3/mm — ABNORMAL HIGH (ref 1.0–3.6)
Lymphocyte %: 80.1 %
MCH: 30.2 pg (ref 26.0–34.0)
MCHC: 32 g/dL (ref 32.0–36.0)
MCV: 95 fL (ref 80–100)
MONO ABS: 0.5 x10 3/mm (ref 0.2–0.9)
MONOS PCT: 2.4 %
NEUTROS ABS: 3.2 x10 3/mm (ref 1.4–6.5)
Neutrophil %: 16.4 %
Platelet: 135 x10 3/mm — ABNORMAL LOW (ref 150–440)
RBC: 4.57 10*6/uL (ref 3.80–5.20)
RDW: 13.9 % (ref 11.5–14.5)
WBC: 19.5 x10 3/mm — ABNORMAL HIGH (ref 3.6–11.0)

## 2014-06-15 ENCOUNTER — Ambulatory Visit: Payer: Self-pay | Admitting: Internal Medicine

## 2014-08-26 ENCOUNTER — Ambulatory Visit: Payer: Self-pay | Admitting: Internal Medicine

## 2014-09-14 ENCOUNTER — Ambulatory Visit
Admit: 2014-09-14 | Disposition: A | Discharge status: Home / Self Care | Payer: Self-pay | Attending: Internal Medicine | Admitting: Internal Medicine

## 2014-10-21 ENCOUNTER — Ambulatory Visit
Admit: 2014-10-21 | Discharge: 2014-10-21 | Disposition: A | Discharge status: Home / Self Care | Payer: Self-pay | Attending: Internal Medicine | Admitting: Internal Medicine

## 2014-10-21 ENCOUNTER — Ambulatory Visit
Admit: 2014-10-21 | Disposition: A | Discharge status: Home / Self Care | Payer: Self-pay | Attending: Internal Medicine | Admitting: Internal Medicine

## 2014-10-21 LAB — CBC CANCER CENTER
BASOS ABS: 0.1 x10 3/mm (ref 0.0–0.1)
Basophil %: 0.2 %
Eosinophil #: 0.1 x10 3/mm (ref 0.0–0.7)
Eosinophil %: 0.5 %
HCT: 43.8 % (ref 35.0–47.0)
HGB: 14.1 g/dL (ref 12.0–16.0)
LYMPHS ABS: 21.1 x10 3/mm — AB (ref 1.0–3.6)
Lymphocyte %: 80.9 %
MCH: 29.9 pg (ref 26.0–34.0)
MCHC: 32.3 g/dL (ref 32.0–36.0)
MCV: 93 fL (ref 80–100)
MONOS PCT: 2.6 %
Monocyte #: 0.7 x10 3/mm (ref 0.2–0.9)
NEUTROS ABS: 4.1 x10 3/mm (ref 1.4–6.5)
Neutrophil %: 15.8 %
PLATELETS: 132 x10 3/mm — AB (ref 150–440)
RBC: 4.73 10*6/uL (ref 3.80–5.20)
RDW: 14.2 % (ref 11.5–14.5)
WBC: 26.1 x10 3/mm — AB (ref 3.6–11.0)

## 2014-11-05 NOTE — H&P (Signed)
PATIENT NAME:  Maureen [name], Maureen [name] MR#:  [id number] DATE OF BIRTH:  12/04/1940  DATE OF ADMISSION:  03/18/2013  CHIEF COMPLAINT AND REASON FOR ADMISSION:  Chronic lymphocytic leukemia, presenting with fever, gram-negative bacteremia and blood cultures drawn as outpatient, systemic inflammatory response syndrome.   HISTORY OF PRESENT ILLNESS:  The patient is a 74 year old female with a known past medical history for stage IV chronic lymphocytic leukemia, initially diagnosed in 2000, more recently in August 2012 had significant progression of CLL and was restarted on single agent Rituxan (prior to this patient had received fludarabine, Cytoxan and Rituxan x 4 cycles), she was seen for scheduled follow-up on 03/12/2013 when she reported that she had a UTI and was finishing up a course of Bactrim DS twice daily by Dr. Ronnald Ramp.  She also was found to be neutropenic on that date and was started on Neupogen injections.  She reported to [facility] yesterday with a feeling of sickness and fevers and blood cultures was drawn.  Today morning preliminary blood culture reports gram-negative bacteremia, gram-negative rods, the patient was brought back for evaluation and she reported she had fever up to 103 degrees with severe chills today.  She is also feeling weak, heart rate is greater than 90.  Otherwise, denies any new cough, dyspnea, chest pain or hemoptysis.  Currently denies any dysuria or hematuria.  No diarrhea.   PAST MEDICAL HISTORY AND PAST SURGICAL HISTORY:   1.  CLL as described above.  Initially diagnosed in 2000.  2.  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  3.  History of arrhythmia years ago.  4.  Breast lump removed from left side 1988, reportedly benign.   FAMILY HISTORY:  Remarkable for heart disease, negative for malignancy.   SOCIAL HISTORY:  Chronic smoker, currently states that she has quit.  Denies alcohol or recreational drug usage.   ALLERGIES:   LOVASTATIN.   HOME MEDICATIONS:   1.  Acyclovir 400 mg three times daily.  2.  Folic acid 1 mg daily.  3.  Iron pill OTC 1 daily.  4.  Bactrim DS 1 tablet Monday, Wednesday, Friday.   5.  Levaquin 500 mg by mouth daily, prescribed yesterday, but has not started yet taking it.   REVIEW OF SYSTEMS: CONSTITUTIONAL:  As in HPI.  Having fevers and chills.  No night sweats.  HEENT:  Denies any headaches.  Has mild dizziness on getting up and ambulating.  No epistaxis, ear or jaw pain.  CARDIAC:  No angina, palpitation, orthopnea, or PND.  LUNGS:  As in HPI.  GASTROINTESTINAL:  No nausea, vomiting, or diarrhea.  No bright red blood in stools or melena.  GENITOURINARY:  No dysuria or hematuria.  MUSCULOSKELETAL:  No new bone pains.  EXTREMITIES:  No new swelling or pain.  NEUROLOGIC:  No new focal weakness, seizures or loss of consciousness.  ENDOCRINE:  No polyuria or polydipsia.  SKIN:  No new rashes or pruritus.  HEMATOLOGIC:  Denies bleeding symptoms.   PHYSICAL EXAMINATION: GENERAL:  The patient is weak and tired-looking, having chills and feeling cold, otherwise alert and oriented x 4 and converses appropriately.  No acute distress at rest.  No icterus.  No pallor.  VITAL SIGNS:  100.8, 88, 20, 97/63, 93% on room air.  HEENT:  Normocephalic, atraumatic.  Extraocular movements intact.  Sclerae anicteric.  No oral thrush.  Mouth is dry.  NECK:  Negative for lymphadenopathy.  CARDIOVASCULAR:  S1, S2, regular rate and rhythm.  LUNGS:  Bilateral good air entry, no crepitations or rhonchi.  ABDOMEN:  Soft, nontender, no guarding or rigidity, bowel sounds present.  EXTREMITIES:  No major edema or cyanosis.  SKIN:  No generalized rashes or discrete lesions.  NEUROLOGIC:  Grossly nonfocal, cranial nerves intact.  MUSCULOSKELETAL:  No obvious joint redness or deformity.   LABORATORY RESULTS:  WBC 7500, ANC 5600, hemoglobin 12.0, platelets 141, creatinine 0.74, potassium 3.9, calcium 8.5.   Liver functions unremarkable except albumin of 3.1.  September 2nd, urine culture greater than 100,000 gram-negative rods.  Blood culture growing gram-negative rods, identification pending.   IMPRESSION AND PLAN: 1.  Stage IV chronic lymphocytic leukemia, most recently on single agent Rituxan therapy, treatment was held last week since she had developed neutropenia which was possibly secondary to twice daily Bactrim dosing along with another possibility of delayed neutropenia from Rituxan side effect.  The patient was started on Neupogen and ANC today is normalized.  2.  Fevers, chills, gram-negative bacteremia - also has heart rate greater than 90 with fever and has systemic inflammatory response syndrome.  Given this along with recent neutropenia, we will need to admit to hospital for IV antibiotics, we will start on IV cefepime 2 grams q. 12 hours and also add Levaquin 500 mg by mouth daily for double coverage until we receive culture identification to make sure that this is not a Pseudomonas infection.  Otherwise, she is not neutropenic at this time.  3.  Clinical dehydration.  We will give IV fluids half-normal saline at 125 mL/hour and monitor.  Repeat CBC and metabolic panel in a.m.  4. Continue other home medications same as before including acyclovir, folic acid. 5. Will hold prophylactic Bactrim DS for now given cytopenias and start back after counts recover adequately.   The patient explained above details, she is agreeable to this plan.    ____________________________ Rhett Bannister. Ma Hillock, MD srp:ea D: 03/19/2013 00:06:01 ET T: 03/19/2013 00:34:55 ET JOB#: [id number]  cc: Trevor Iha R. Ma Hillock, MD, <Dictator> Alveta Heimlich MD ELECTRONICALLY SIGNED 03/19/2013 9:49

## 2014-11-05 NOTE — Discharge Summary (Signed)
PATIENT NAME:  Maureen [name], Maureen [name] MR#:  [id number] DATE OF BIRTH:  10-12-1940  DATE OF ADMISSION:  03/18/2013 DATE OF DISCHARGE:  03/20/2013  DIAGNOSES: 1. Escherichia coli urinary tract infection, bacteremia, systemic inflammatory response syndrome.  2. Chronic lymphocytic anemia.   HISTORY OF PRESENT ILLNESS: The patient is a 74 year old female with known history of stage IV chronic lymphocytic leukemia, more recently on single agent Rituxan treatment. The patient also had taken course of Bactrim DS twice daily as outpatient in the end of August, and presented for reduction treatment on 09/03 when she was found to be significantly neutropenic. She was given Neupogen therapy and ANC improved, but she presented on 09/03 with complaints of fever up to 103, fevers and blood cultures were drawn with preliminary report showing gram-negative bacteremia with gram-negative rods. The patient was brought back for evaluation and she reported fever up to 103 with severe chills, weakness and heart rate was greater than 90. She was admitted to hospital for further management of gram-negative bacteremia, SIRS.   PAST MEDICAL AND SURGICAL HISTORY, FAMILY HISTORY, SOCIAL HISTORY, MEDICATIONS, REVIEW OF SYSTEMS, EXAM. LABORATORY  RESULTS:  Please refer to history and physical for details.   HOSPITAL COURSE: On admission CBC showed ANC normal at 5600, hemoglobin 12, platelets 141, creatinine 0.74 and liver functions unremarkable except albumin of 3.1. The patient was started on broad-spectrum double coverage for gram-negative rods with IV cefepime and oral Levaquin until possibility of pseudomonas was ruled out. Once blood cultures and urine culture returned as E. coli sensitive to cephalosporins and quinolones, she was continued on IV cefepime only until 09/05, at which time repeat blood cultures were drawn. She was also clinically dehydrated and was given half normal saline 125 mL per hour and clinically she improved  significantly. By 09/05 she was doing well and eating well. She was ambulating around the nursing station without any symptoms. She did not have any fevers or chills. She was changed to oral Levaquin, the patient wanted to go home on that date and not wait for another 24 hours, she was otherwise to return to the ER in case of any fevers or chills and was discharged home on that date.   DISCHARGE MEDICATIONS: 1. Levaquin 750 mg p.o. daily x10 days.  2. Tylenol p.r.n. for pain or fever.  3. Folic acid 195 mcg once daily.  4. Acyclovir 400 mg p.o. t.i.d.  5. Iron tablet 1 daily as before.   DIET: Regular.   ACTIVITY: As tolerated.   FOLLOW-UP: At [facility] on 03/26/2012 with labs and see Dr. Ma Hillock.    ____________________________ Rhett Bannister. Ma Hillock, MD srp:sg D: 03/24/2013 10:00:12 ET T: 03/24/2013 10:22:19 ET JOB#: [id number]  cc: Adasia Hoar R. Ma Hillock, MD, <Dictator> Alveta Heimlich MD ELECTRONICALLY SIGNED 03/24/2013 17:20

## 2014-12-13 ENCOUNTER — Other Ambulatory Visit: Payer: Self-pay | Admitting: *Deleted

## 2014-12-13 DIAGNOSIS — C9110 Chronic lymphocytic leukemia of B-cell type not having achieved remission: Secondary | ICD-10-CM

## 2014-12-13 DIAGNOSIS — D696 Thrombocytopenia, unspecified: Secondary | ICD-10-CM

## 2014-12-16 ENCOUNTER — Inpatient Hospital Stay: Payer: PPO | Attending: Internal Medicine

## 2014-12-16 DIAGNOSIS — D696 Thrombocytopenia, unspecified: Secondary | ICD-10-CM

## 2014-12-16 DIAGNOSIS — C9110 Chronic lymphocytic leukemia of B-cell type not having achieved remission: Secondary | ICD-10-CM | POA: Insufficient documentation

## 2014-12-16 LAB — CBC WITH DIFFERENTIAL/PLATELET
BASOS ABS: 0.1 10*3/uL (ref 0–0.1)
Basophils Relative: 1 %
EOS ABS: 0.1 10*3/uL (ref 0–0.7)
Eosinophils Relative: 0 %
HCT: 42.2 % (ref 35.0–47.0)
HEMOGLOBIN: 13.6 g/dL (ref 12.0–16.0)
Lymphocytes Relative: 84 %
Lymphs Abs: 20.6 10*3/uL — ABNORMAL HIGH (ref 1.0–3.6)
MCH: 30 pg (ref 26.0–34.0)
MCHC: 32.3 g/dL (ref 32.0–36.0)
MCV: 93 fL (ref 80.0–100.0)
Monocytes Absolute: 0.6 10*3/uL (ref 0.2–0.9)
Monocytes Relative: 2 %
NEUTROS ABS: 3.3 10*3/uL (ref 1.4–6.5)
NEUTROS PCT: 13 %
Platelets: 107 10*3/uL — ABNORMAL LOW (ref 150–440)
RBC: 4.53 MIL/uL (ref 3.80–5.20)
RDW: 14.3 % (ref 11.5–14.5)
WBC: 24.7 10*3/uL — AB (ref 3.6–11.0)

## 2015-02-10 ENCOUNTER — Inpatient Hospital Stay: Payer: PPO | Attending: Internal Medicine

## 2015-02-10 DIAGNOSIS — D649 Anemia, unspecified: Secondary | ICD-10-CM | POA: Diagnosis not present

## 2015-02-10 DIAGNOSIS — C9110 Chronic lymphocytic leukemia of B-cell type not having achieved remission: Secondary | ICD-10-CM

## 2015-02-10 DIAGNOSIS — D696 Thrombocytopenia, unspecified: Secondary | ICD-10-CM

## 2015-02-10 LAB — CBC WITH DIFFERENTIAL/PLATELET
Basophils Absolute: 0.1 10*3/uL (ref 0–0.1)
Basophils Relative: 0 %
Eosinophils Absolute: 0.2 10*3/uL (ref 0–0.7)
Eosinophils Relative: 1 %
HEMATOCRIT: 43 % (ref 35.0–47.0)
Hemoglobin: 13.7 g/dL (ref 12.0–16.0)
Lymphocytes Relative: 85 %
Lymphs Abs: 29 10*3/uL — ABNORMAL HIGH (ref 1.0–3.6)
MCH: 29.9 pg (ref 26.0–34.0)
MCHC: 31.9 g/dL — ABNORMAL LOW (ref 32.0–36.0)
MCV: 93.7 fL (ref 80.0–100.0)
Monocytes Absolute: 0.7 10*3/uL (ref 0.2–0.9)
Monocytes Relative: 2 %
NEUTROS PCT: 12 %
Neutro Abs: 4.1 10*3/uL (ref 1.4–6.5)
PLATELETS: 109 10*3/uL — AB (ref 150–440)
RBC: 4.58 MIL/uL (ref 3.80–5.20)
RDW: 14.2 % (ref 11.5–14.5)
WBC: 34 10*3/uL — ABNORMAL HIGH (ref 3.6–11.0)

## 2015-03-16 ENCOUNTER — Other Ambulatory Visit: Payer: Self-pay | Admitting: Internal Medicine

## 2015-03-16 NOTE — Telephone Encounter (Signed)
Per Dr Ma Hillock, #[phone] plus 3 refills e scribed

## 2015-04-07 ENCOUNTER — Encounter: Payer: Self-pay | Admitting: Internal Medicine

## 2015-04-07 ENCOUNTER — Inpatient Hospital Stay: Payer: PPO | Attending: Internal Medicine

## 2015-04-07 ENCOUNTER — Inpatient Hospital Stay (HOSPITAL_BASED_OUTPATIENT_CLINIC_OR_DEPARTMENT_OTHER): Payer: PPO | Admitting: Internal Medicine

## 2015-04-07 VITALS — BP 183/80 | HR 72 | Temp 98.2°F | Resp 18 | Ht 67.0 in | Wt 140.2 lb

## 2015-04-07 DIAGNOSIS — C9110 Chronic lymphocytic leukemia of B-cell type not having achieved remission: Secondary | ICD-10-CM | POA: Diagnosis not present

## 2015-04-07 DIAGNOSIS — Z23 Encounter for immunization: Secondary | ICD-10-CM

## 2015-04-07 DIAGNOSIS — D649 Anemia, unspecified: Secondary | ICD-10-CM

## 2015-04-07 DIAGNOSIS — Z79899 Other long term (current) drug therapy: Secondary | ICD-10-CM | POA: Diagnosis not present

## 2015-04-07 DIAGNOSIS — F17210 Nicotine dependence, cigarettes, uncomplicated: Secondary | ICD-10-CM | POA: Diagnosis not present

## 2015-04-07 DIAGNOSIS — D696 Thrombocytopenia, unspecified: Secondary | ICD-10-CM

## 2015-04-07 DIAGNOSIS — L989 Disorder of the skin and subcutaneous tissue, unspecified: Secondary | ICD-10-CM | POA: Insufficient documentation

## 2015-04-07 DIAGNOSIS — E538 Deficiency of other specified B group vitamins: Secondary | ICD-10-CM

## 2015-04-07 DIAGNOSIS — Z8673 Personal history of transient ischemic attack (TIA), and cerebral infarction without residual deficits: Secondary | ICD-10-CM | POA: Diagnosis not present

## 2015-04-07 LAB — CBC WITH DIFFERENTIAL/PLATELET
BASOS PCT: 0 %
Basophils Absolute: 0.2 10*3/uL — ABNORMAL HIGH (ref 0–0.1)
Eosinophils Absolute: 0.2 10*3/uL (ref 0–0.7)
Eosinophils Relative: 0 %
HEMATOCRIT: 45 % (ref 35.0–47.0)
Hemoglobin: 14.3 g/dL (ref 12.0–16.0)
LYMPHS ABS: 44.2 10*3/uL — AB (ref 1.0–3.6)
Lymphocytes Relative: 90 %
MCH: 30.1 pg (ref 26.0–34.0)
MCHC: 31.8 g/dL — AB (ref 32.0–36.0)
MCV: 94.7 fL (ref 80.0–100.0)
MONOS PCT: 2 %
Monocytes Absolute: 0.9 10*3/uL (ref 0.2–0.9)
NEUTROS ABS: 3.8 10*3/uL (ref 1.4–6.5)
NEUTROS PCT: 8 %
Platelets: 125 10*3/uL — ABNORMAL LOW (ref 150–440)
RBC: 4.75 MIL/uL (ref 3.80–5.20)
RDW: 14.9 % — ABNORMAL HIGH (ref 11.5–14.5)
WBC: 49.3 10*3/uL — ABNORMAL HIGH (ref 3.6–11.0)

## 2015-04-07 LAB — HEPATIC FUNCTION PANEL
ALBUMIN: 4.5 g/dL (ref 3.5–5.0)
ALT: 15 U/L (ref 14–54)
AST: 19 U/L (ref 15–41)
Alkaline Phosphatase: 57 U/L (ref 38–126)
BILIRUBIN TOTAL: 0.7 mg/dL (ref 0.3–1.2)
Bilirubin, Direct: 0.1 mg/dL (ref 0.1–0.5)
Indirect Bilirubin: 0.6 mg/dL (ref 0.3–0.9)
TOTAL PROTEIN: 7 g/dL (ref 6.5–8.1)

## 2015-04-07 LAB — CREATININE, SERUM
Creatinine, Ser: 0.69 mg/dL (ref 0.44–1.00)
GFR calc Af Amer: >60 mL/min (ref >60)

## 2015-04-07 LAB — LACTATE DEHYDROGENASE: LDH: 170 U/L (ref 98–192)

## 2015-04-07 MED ORDER — HYDROXYZINE HCL 25 MG PO TABS: 25.0000 mg | ORAL_TABLET | Freq: Three times a day (TID) | ORAL | Status: DC | PRN

## 2015-04-07 MED ORDER — CLOBETASOL PROPIONATE 0.05 % EX CREA: 1.0000 "application " | TOPICAL_CREAM | Freq: Two times a day (BID) | CUTANEOUS | Status: DC

## 2015-04-07 MED ORDER — ACYCLOVIR 200 MG PO CAPS: 200.0000 mg | ORAL_CAPSULE | Freq: Three times a day (TID) | ORAL | Status: DC

## 2015-04-07 MED ORDER — DOXYCYCLINE HYCLATE 100 MG PO TABS
100.0000 mg | ORAL_TABLET | Freq: Two times a day (BID) | ORAL | Status: DC
Start: 2015-04-07 — End: 2016-08-29

## 2015-04-07 MED ORDER — INFLUENZA VAC SPLIT QUAD 0.5 ML IM SUSY
0.5000 mL | PREFILLED_SYRINGE | Freq: Once | INTRAMUSCULAR | Status: AC
  Administered 2015-04-07: 0.5 mL via INTRAMUSCULAR
  Filled 2015-04-07: qty 0.5

## 2015-04-07 NOTE — Progress Notes (Signed)
[facility]  [phone] [phone]     ID: Maureen [name] OB: Aug 25, 1940  MR#: [id number]  PFX#:[id number]  No care team member to display  CHIEF COMPLAINT/DIAGNOSIS:  1. Stage IV chronic lymphocytic leukemia (CLL)  -  Bone Marrow Study September 2009 showed 90% cellular marrow with diffuse involvement by CLL.  Patient completed 4 cycles of chemotherapy with Fludarabine, Cytoxan, Rituxan on August 06, 2008, had partial remission. Was on observation since. August 2012 - significant progression of CLL, restarted on single agent Rituxan (got maintenance dose #7 on 12/18/12, then on hold since she developed neutropenia).  2. H/o skin lesions over b/l legs - biopsy by derm Dr. 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 Extensive subepidermal edema is also noted. In the underlying dermis there is a superficial and deep predominantly perivascular but also interstitial infiltrate composed of numerous lymphocytes and eosinophils along with red blood cell extravasation No flame figures are identified. No histologic evidence of leukocytoclastic vasculitis is noted. Presence of vesicular spongiosis with the underlying infiltrate raises the differential diagnosis of a vesicular arthropod bite reaction. Other histologic possibilities include an early Wells syndrome although the findings are not diagnostic. Hypereosinophilic syndrome can produce similar features however it is a multisystem syndrome and correlation with peripheral blood and other systemic studies may be considered if it is a clinical consideration. A drug reaction is also in the differential diagnosis. A PAS/F stain is performed and negative for fungal hyphae.   3. B12 deficiency, diagnosed September 2010.  Intrinsic factor antibody negative.    HISTORY OF PRESENT ILLNESS:  Patient  returns for continued oncology followup for CLL as above, she was seen few months ago. States that she has noticed recurrence of skin lesions over both legs only, with itching. No other rash. No fevers, or night sweats. Chronic fatigue on exertion is same. Otherwise, no new dyspnea, orthopnea, cough, chest pain, or PND. No vomiting or diarrhea. No bleeding symptoms including blood in stools or urine. Denies feeling any enlarged lymph node masses on self exam. Appetite is fairly steady.  REVIEW OF SYSTEMS:   ROS As in HPI above. In addition, no fevers or chills. No new headaches or focal weakness.  No new sore throat, cough, shortness of breath, sputum, hemoptysis or chest pain. No dizziness or palpitation. No abdominal pain, constipation, diarrhea, dysuria or hematuria. No bleeding symptoms. No new paresthesias in extremities.   PAST MEDICAL HISTORY: Reviewed. Past Medical History  Diagnosis Date  . CLL (chronic lymphocytic leukemia)   . Vitamin B 12 deficiency           CLL as described above, diagnosed 2000, on observation, stage B[place] A.    History of CVA at age 6, and weakness in the right face and speech difficulty, recovered completely.   States she had arrhythmia years ago.             Breast mass removed from left side in 1988, states it was benign  PAST SURGICAL HISTORY: Reviewed. Past Surgical History  Procedure Laterality Date  . Breeast mass removed  1988    benign   FAMILY HISTORY: Reviewed. Positive for heart disease. Negative for malignancy or CLL.  SOCIAL HISTORY: Reviewed. Social History  Substance Use Topics  . Smoking status: Current Every Day Smoker -- 0.50 packs/day    Types: Cigarettes  . Smokeless tobacco: Never Used  . Alcohol Use:  Yes     Comment: occ.    Current Outpatient Prescriptions  Medication Sig Dispense Refill  . acyclovir (ZOVIRAX) 200 MG capsule Take 1 capsule (200 mg total) by mouth 3 (three) times daily. 90 capsule 5  . ferrous sulfate 325  (65 FE) MG tablet Take 325 mg by mouth daily with breakfast.    . folic acid (FOLVITE) 063 MCG tablet Take 400 mcg by mouth daily.    . hydrOXYzine (ATARAX/VISTARIL) 25 MG tablet Take 1 tablet (25 mg total) by mouth 3 (three) times daily as needed. 60 tablet 1  . sulfamethoxazole-trimethoprim (BACTRIM DS,SEPTRA DS) 800-160 MG per tablet TAKE ONE TABLET BY MOUTH ON MONDAY,WEDNESDAY AND FRIDAY 36 tablet 3  . clobetasol cream (TEMOVATE) 0.16 % Apply 1 application topically 2 (two) times daily. 30 g 1  . doxycycline (VIBRA-TABS) 100 MG tablet Take 1 tablet (100 mg total) by mouth 2 (two) times daily. 14 tablet 0   No current facility-administered medications for this visit.    PHYSICAL EXAM: Filed Vitals:   04/07/15 0936  BP: 183/80  Pulse: 72  Temp: 98.2 F (36.8 C)  Resp: 18     Body mass index is 21.96 kg/(m^2).    ECOG FS:0 - Asymptomatic  GENERAL: Alert and oriented and in no acute distress. There is no icterus or pallor. HEENT: EOMs intact. Oral exam negative for thrush or lesions. No cervical lymphadenopathy. CVS: S1S2, regular LUNGS: Bilaterally clear to auscultation, no rhonchi. ABDOMEN: Soft, nontender. No hepatosplenomegaly clinically.  NEURO: grossly nonfocal, cranial nerves are intact.  EXTREMITIES: No pedal edema. LYMPHATICS: No palpable adenopathy in axillary or inguinal areas. SKIN: 2-3 isolated erythematous skin lesions over both legs, no blisters or pustules.   LAB RESULTS:    Component Value Date/Time   NA 144 03/26/2013 0823   K 4.0 03/26/2013 0823   CL 106 03/26/2013 0823   CO2 28 03/26/2013 0823   GLUCOSE 96 03/26/2013 0823   BUN 12 03/26/2013 0823   CREATININE 0.69 04/07/2015 0857   CREATININE 0.64 12/17/2013 0947   CALCIUM 8.6 03/26/2013 0823   PROT 7.0 04/07/2015 0857   PROT 6.6 12/17/2013 0947   ALBUMIN 4.5 04/07/2015 0857   ALBUMIN 4.0 12/17/2013 0947   AST 19 04/07/2015 0857   AST 19 12/17/2013 0947   ALT 15 04/07/2015 0857   ALT 26 12/17/2013  0947   ALKPHOS 57 04/07/2015 0857   ALKPHOS 63 12/17/2013 0947   BILITOT 0.7 04/07/2015 0857   BILITOT 0.4 12/17/2013 0947   GFRNONAA >60 04/07/2015 0857   GFRNONAA >60 09/24/2013 0854   GFRNONAA >60 10/04/2011 0823   GFRAA >60 04/07/2015 0857   GFRAA >60 09/24/2013 0854   GFRAA >60 10/04/2011 0823    Lab Results  Component Value Date   WBC 49.3* 04/07/2015   NEUTROABS 3.8 04/07/2015   HGB 14.3 04/07/2015   HCT 45.0 04/07/2015   MCV 94.7 04/07/2015   PLT 125* 04/07/2015      STUDIES: No results found.    ASSESSMENT / PLAN:   Impression:   #1 Recurrent Chronic Lymphocytic Leukemia, stage 4 -  Have reviewed labs from today and d/w patient, have explained that leukocytosis seems to be slowly progressive with mostly absolute lymphocytosis of 010[address]. Otherwise hemoglobin remains normal, continues to have thrombocytopenia but platelet count remains over 100. She does not have any bulky lymphadenopathy or hepatosplenomegaly on exam today. Have explained that CLL is recurring but at this time plan is continued close monitoring and  that she may need to resume on treatment sometime in the near future. Given skin lesions, will followup in 6 weeks with repeat labs.  #2 Anemia - hemoglobin is doing well, monitor.   #3 Thrombocytopenia - platelet count is steady, no bleeding symptoms, continue to monitor. #4 Skin lesions  - occurs over legs only, prior bx in 2012 as described above did not report leukemia cutis. Have advised her to avoid bug bites. Will try prior derm recommendations including course of oral Doxycycline, topical Clobetasol, and Hydroxyzine prn for itching. Continue prophylactic low dose Acyclovir.  5. In between visits, patient advised to call or come to ER in case of any new symptoms or acute sickness. She is agreeable to this plan.    Leia Alf, MD   04/07/2015 12:54 PM

## 2015-04-07 NOTE — Progress Notes (Signed)
Feeling depressed. notgot settlement from [place] yet. Not a lot of money. She has red raised areas on her lower legs that she has had before and they itch, are red, and inflamed and hot to touch at times she states.  She has tried benadryl and salt water soaks and nothing has helped.

## 2015-04-14 ENCOUNTER — Encounter: Payer: Self-pay | Admitting: *Deleted

## 2015-04-14 NOTE — Progress Notes (Unsigned)
Patient RX for hydroxyzine HLC orginally sent to [facility] in [place]. Needs prior auth performed. Initiated prior auth through [email or web address]. Dob: 11/26/1950; key#: [id number].  Auth pending.

## 2015-05-11 ENCOUNTER — Other Ambulatory Visit: Payer: Self-pay | Admitting: Internal Medicine

## 2015-05-31 ENCOUNTER — Other Ambulatory Visit: Payer: Self-pay | Admitting: *Deleted

## 2015-05-31 ENCOUNTER — Inpatient Hospital Stay: Payer: PPO | Attending: Internal Medicine

## 2015-05-31 ENCOUNTER — Inpatient Hospital Stay (HOSPITAL_BASED_OUTPATIENT_CLINIC_OR_DEPARTMENT_OTHER): Payer: PPO | Admitting: Internal Medicine

## 2015-05-31 VITALS — BP 170/98 | HR 73 | Temp 98.0°F | Wt 142.2 lb

## 2015-05-31 DIAGNOSIS — Z79899 Other long term (current) drug therapy: Secondary | ICD-10-CM | POA: Insufficient documentation

## 2015-05-31 DIAGNOSIS — F17210 Nicotine dependence, cigarettes, uncomplicated: Secondary | ICD-10-CM | POA: Diagnosis not present

## 2015-05-31 DIAGNOSIS — Z23 Encounter for immunization: Secondary | ICD-10-CM

## 2015-05-31 DIAGNOSIS — E538 Deficiency of other specified B group vitamins: Secondary | ICD-10-CM | POA: Diagnosis not present

## 2015-05-31 DIAGNOSIS — C9110 Chronic lymphocytic leukemia of B-cell type not having achieved remission: Secondary | ICD-10-CM

## 2015-05-31 LAB — CBC WITH DIFFERENTIAL/PLATELET
BASOS ABS: 0.2 10*3/uL — AB (ref 0–0.1)
EOS ABS: 0.2 10*3/uL (ref 0–0.7)
HCT: 44.9 % (ref 35.0–47.0)
Hemoglobin: 14.3 g/dL (ref 12.0–16.0)
Lymphocytes Relative: 90 %
Lymphs Abs: 51.5 10*3/uL — ABNORMAL HIGH (ref 1.0–3.6)
MCH: 30.3 pg (ref 26.0–34.0)
MCHC: 31.8 g/dL — ABNORMAL LOW (ref 32.0–36.0)
MCV: 95.1 fL (ref 80.0–100.0)
Monocytes Absolute: 1.1 10*3/uL — ABNORMAL HIGH (ref 0.2–0.9)
Monocytes Relative: 2 %
Neutro Abs: 4.7 10*3/uL (ref 1.4–6.5)
Neutrophils Relative %: 8 %
PLATELETS: 126 10*3/uL — AB (ref 150–440)
RBC: 4.72 MIL/uL (ref 3.80–5.20)
RDW: 14.5 % (ref 11.5–14.5)
WBC: 57.7 10*3/uL — AB (ref 3.6–11.0)

## 2015-05-31 LAB — CREATININE, SERUM
CREATININE: 0.7 mg/dL (ref 0.44–1.00)
GFR calc Af Amer: >60 mL/min (ref >60)

## 2015-05-31 NOTE — Progress Notes (Signed)
[facility] OFFICE PROGRESS NOTE  No care team member to display   SUMMARY OF ONCOLOGIC HISTORY:  # 2002- CLL [Dx- in [place] 2010- FCR x 4 cycles;Normal karyotype; PR; AUG 2012- Single agent Rituxan x7 [held sec to neutropenia]  # June 2015- Progression based on lymphocytosis- surveillance  # Hx of skin rash [2012 s/p Bx-ant thigh lesion-vesicular spongiotic dermatitis]-resolved; B12 def [2012]  INTERVAL HISTORY:  This is my first interaction with the patient since I joined the practice September 2016. I reviewed the patient's prior chart/pertinent labs/imaging in detail; findings are summarized above.  A very pleasant 74 year old female patient with above history of stage IV recurrent CLL- currently under surveillance is here for follow-up. Patient denies any unusual night sweats or weight loss or fevers or frequent infections.  Appetite is good. Denies any unusual lumps or bumps. She admits to recent "cold" after a recent flu shot.  REVIEW OF SYSTEMS:  A complete 10 point review of system is done which is negative except mentioned above/history of present illness.   PAST MEDICAL HISTORY :  Past Medical History  Diagnosis Date  . CLL (chronic lymphocytic leukemia)   . Vitamin B 12 deficiency   . Itching     PAST SURGICAL HISTORY :   Past Surgical History  Procedure Laterality Date  . Breeast mass removed  1988    benign    FAMILY HISTORY :  No family history on file.  SOCIAL HISTORY:   Social History  Substance Use Topics  . Smoking status: Current Every Day Smoker -- 0.50 packs/day    Types: Cigarettes  . Smokeless tobacco: Never Used  . Alcohol Use: Yes     Comment: occ.    ALLERGIES:  has no allergies on file.  MEDICATIONS:  Current Outpatient Prescriptions  Medication Sig Dispense Refill  . acyclovir (ZOVIRAX) 200 MG capsule TAKE ONE CAPSULE BY MOUTH THREE TIMES DAILY 90 capsule 0  . doxycycline (VIBRA-TABS) 100 MG tablet Take 1 tablet  (100 mg total) by mouth 2 (two) times daily. (Patient taking differently: Take 100 mg by mouth 2 (two) times daily. ) 14 tablet 0  . ferrous sulfate 325 (65 FE) MG tablet Take 325 mg by mouth daily with breakfast.    . folic acid (FOLVITE) 563 MCG tablet Take 400 mcg by mouth daily.    Marland Kitchen sulfamethoxazole-trimethoprim (BACTRIM DS,SEPTRA DS) 800-160 MG per tablet TAKE ONE TABLET BY MOUTH ON MONDAY,WEDNESDAY AND FRIDAY 36 tablet 3  . clobetasol cream (TEMOVATE) 8.75 % Apply 1 application topically 2 (two) times daily. (Patient not taking: Reported on 05/31/2015) 30 g 1  . hydrOXYzine (ATARAX/VISTARIL) 25 MG tablet Take 1 tablet (25 mg total) by mouth 3 (three) times daily as needed. (Patient not taking: Reported on 05/31/2015) 60 tablet 1   No current facility-administered medications for this visit.    PHYSICAL EXAMINATION: ECOG PERFORMANCE STATUS: 0 - Asymptomatic  BP 170/98 mmHg  Pulse 73  Temp(Src) 98 F (36.7 C) (Tympanic)  Wt 142 lb 3.2 oz (64.5 kg)  SpO2 96%  Filed Weights   05/31/15 0901  Weight: 142 lb 3.2 oz (64.5 kg)    GENERAL: Well-nourished well-developed; Alert, no distress and comfortable.   Alone.  EYES: no pallor or icterus OROPHARYNX: no thrush or ulceration; good dentition  NECK: supple, no masses felt LYMPH:  no palpable lymphadenopathy in the cervical, axillary or inguinal regions LUNGS: clear to auscultation and  No wheeze or crackles HEART/CVS: regular rate & rhythm  and no murmurs; No lower extremity edema ABDOMEN:abdomen soft, non-tender and normal bowel sounds Musculoskeletal:no cyanosis of digits and no clubbing  PSYCH: alert & oriented x 3 with fluent speech NEURO: no focal motor/sensory deficits SKIN:  no rashes or significant lesions  LABORATORY DATA:  I have reviewed the data as listed    Component Value Date/Time   NA 144 03/26/2013 0823   K 4.0 03/26/2013 0823   CL 106 03/26/2013 0823   CO2 28 03/26/2013 0823   GLUCOSE 96 03/26/2013 0823    BUN 12 03/26/2013 0823   CREATININE 0.70 05/31/2015 0829   CREATININE 0.64 12/17/2013 0947   CALCIUM 8.6 03/26/2013 0823   PROT 7.0 04/07/2015 0857   PROT 6.6 12/17/2013 0947   ALBUMIN 4.5 04/07/2015 0857   ALBUMIN 4.0 12/17/2013 0947   AST 19 04/07/2015 0857   AST 19 12/17/2013 0947   ALT 15 04/07/2015 0857   ALT 26 12/17/2013 0947   ALKPHOS 57 04/07/2015 0857   ALKPHOS 63 12/17/2013 0947   BILITOT 0.7 04/07/2015 0857   BILITOT 0.4 12/17/2013 0947   GFRNONAA >60 05/31/2015 0829   GFRNONAA >60 09/24/2013 0854   GFRNONAA >60 10/04/2011 0823   GFRAA >60 05/31/2015 0829   GFRAA >60 09/24/2013 0854   GFRAA >60 10/04/2011 0823    No results found for: SPEP, UPEP  Lab Results  Component Value Date   WBC 57.7* 05/31/2015   NEUTROABS 4.7 05/31/2015   HGB 14.3 05/31/2015   HCT 44.9 05/31/2015   MCV 95.1 05/31/2015   PLT 126* 05/31/2015      Chemistry      Component Value Date/Time   NA 144 03/26/2013 0823   K 4.0 03/26/2013 0823   CL 106 03/26/2013 0823   CO2 28 03/26/2013 0823   BUN 12 03/26/2013 0823   CREATININE 0.70 05/31/2015 0829   CREATININE 0.64 12/17/2013 0947      Component Value Date/Time   CALCIUM 8.6 03/26/2013 0823   ALKPHOS 57 04/07/2015 0857   ALKPHOS 63 12/17/2013 0947   AST 19 04/07/2015 0857   AST 19 12/17/2013 0947   ALT 15 04/07/2015 0857   ALT 26 12/17/2013 0947   BILITOT 0.7 04/07/2015 0857   BILITOT 0.4 12/17/2013 0947       RADIOGRAPHIC STUDIES: I have personally reviewed the radiological images as listed and agreed with the findings in the report. No results found.   ASSESSMENT & PLAN:   # CLL/  recurrent stage IV- most recent treatment single agent Rituxan 2012. Patient's absolute lymphocyte count is 51,000; up from 20,000 in April 2016. Lymphocyte doubling count approximate 6 months. However hemoglobin-normal at 14; platelets mildly low at 126/stable. Patient continues to be asymptomatic clinically. Treatment will be recommended  only if patient is symptomatic.   # I had a long discussion the patient regarding the multiple treatment options available for CLL at this time including- Ibrutinib; Gazyva; and again bendamustine and rituximab. I do not think patient is a candidate for FCR which she previously had.  # With the likely progression of CLL- I would recommend checking peripheral blood for fish abnormalities; And also check IGV H mutation status at next visit.  #  follow-up 3 months with labs or sooner if symptomatic.    All questions were answered. The patient knows to call the clinic with any problems, questions or concerns. No barriers to learning was detected.     Cammie Sickle, MD 05/31/2015 9:27 AM

## 2015-05-31 NOTE — Progress Notes (Signed)
Pt doing fairly well. Resolving head cold, still has sinus congestion. Pt states the head cold is a result of her flu shot. No fever. No dyspnea noted. Pt states appetite is good. Has poor taste buds. Mouth stays very dry. Pt gets a rash with blisters on her legs that comes and goes. Comes more in the summer months. She has medications to take as needed when the rash occurs. Her youngest son is currently taking chemo for a head and neck cancer at the [place]. Pt is aware her CLL is recurring.

## 2015-08-30 ENCOUNTER — Inpatient Hospital Stay (HOSPITAL_BASED_OUTPATIENT_CLINIC_OR_DEPARTMENT_OTHER): Payer: PPO | Admitting: Internal Medicine

## 2015-08-30 ENCOUNTER — Inpatient Hospital Stay: Payer: PPO | Attending: Internal Medicine

## 2015-08-30 ENCOUNTER — Other Ambulatory Visit: Payer: Self-pay | Admitting: Internal Medicine

## 2015-08-30 VITALS — BP 160/76 | HR 77 | Temp 99.2°F | Wt 141.0 lb

## 2015-08-30 DIAGNOSIS — L299 Pruritus, unspecified: Secondary | ICD-10-CM | POA: Diagnosis not present

## 2015-08-30 DIAGNOSIS — Z79899 Other long term (current) drug therapy: Secondary | ICD-10-CM | POA: Diagnosis not present

## 2015-08-30 DIAGNOSIS — E538 Deficiency of other specified B group vitamins: Secondary | ICD-10-CM | POA: Diagnosis not present

## 2015-08-30 DIAGNOSIS — F17210 Nicotine dependence, cigarettes, uncomplicated: Secondary | ICD-10-CM | POA: Insufficient documentation

## 2015-08-30 DIAGNOSIS — C9112 Chronic lymphocytic leukemia of B-cell type in relapse: Secondary | ICD-10-CM

## 2015-08-30 DIAGNOSIS — C9192 Lymphoid leukemia, unspecified, in relapse: Secondary | ICD-10-CM

## 2015-08-30 DIAGNOSIS — C9110 Chronic lymphocytic leukemia of B-cell type not having achieved remission: Secondary | ICD-10-CM | POA: Diagnosis not present

## 2015-08-30 LAB — CBC WITH DIFFERENTIAL/PLATELET
Basophils Absolute: 0.1 10*3/uL (ref 0–0.1)
EOS ABS: 0.1 10*3/uL (ref 0–0.7)
Eosinophils Relative: 0 %
HEMATOCRIT: 43.5 % (ref 35.0–47.0)
HEMOGLOBIN: 13.6 g/dL (ref 12.0–16.0)
Lymphocytes Relative: 92 %
Lymphs Abs: 57.1 10*3/uL — ABNORMAL HIGH (ref 1.0–3.6)
MCH: 30 pg (ref 26.0–34.0)
MCHC: 31.3 g/dL — AB (ref 32.0–36.0)
MCV: 95.8 fL (ref 80.0–100.0)
Monocytes Absolute: 1 10*3/uL — ABNORMAL HIGH (ref 0.2–0.9)
NEUTROS ABS: 3.8 10*3/uL (ref 1.4–6.5)
Platelets: 107 10*3/uL — ABNORMAL LOW (ref 150–440)
RBC: 4.54 MIL/uL (ref 3.80–5.20)
RDW: 14.7 % — ABNORMAL HIGH (ref 11.5–14.5)
WBC: 62.1 10*3/uL — AB (ref 3.6–11.0)

## 2015-08-30 LAB — COMPREHENSIVE METABOLIC PANEL
ALBUMIN: 4.7 g/dL (ref 3.5–5.0)
ALK PHOS: 50 U/L (ref 38–126)
ALT: 18 U/L (ref 14–54)
AST: 21 U/L (ref 15–41)
Anion gap: 5 (ref 5–15)
BILIRUBIN TOTAL: 0.8 mg/dL (ref 0.3–1.2)
BUN: 13 mg/dL (ref 6–20)
CALCIUM: 9 mg/dL (ref 8.9–10.3)
CO2: 28 mmol/L (ref 22–32)
CREATININE: 0.76 mg/dL (ref 0.44–1.00)
Chloride: 107 mmol/L (ref 101–111)
GFR calc Af Amer: >60 mL/min (ref >60)
GFR calc non Af Amer: >60 mL/min (ref >60)
GLUCOSE: 106 mg/dL — AB (ref 65–99)
Potassium: 4.5 mmol/L (ref 3.5–5.1)
SODIUM: 140 mmol/L (ref 135–145)
TOTAL PROTEIN: 6.7 g/dL (ref 6.5–8.1)

## 2015-08-30 LAB — LACTATE DEHYDROGENASE: LDH: 159 U/L (ref 98–192)

## 2015-08-30 NOTE — Progress Notes (Signed)
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   Denies any weight loss fevers night sweats. She is exercising  Denies any unusual lumps or bumps.  REVIEW OF SYSTEMS:  A complete 10 point review of system is done which is negative except mentioned above/history of present illness.   PAST MEDICAL HISTORY :  Past Medical History  Diagnosis Date  . CLL (chronic lymphocytic leukemia)   . Vitamin B 12 deficiency   . Itching     PAST SURGICAL HISTORY :   Past Surgical History  Procedure Laterality Date  . Breeast mass removed  1988    benign    FAMILY HISTORY :  No family history on file.  SOCIAL HISTORY:   Social History  Substance Use Topics  . Smoking status: Current Every Day Smoker -- 0.50 packs/day    Types: Cigarettes  . Smokeless tobacco: Never Used  . Alcohol Use: Yes     Comment: occ.    ALLERGIES:  has no allergies on file.  MEDICATIONS:  Current Outpatient Prescriptions  Medication Sig Dispense Refill  . acyclovir (ZOVIRAX) 200 MG capsule TAKE ONE CAPSULE BY MOUTH THREE TIMES DAILY 90 capsule 0  . ferrous sulfate 325 (65 FE) MG tablet Take 325 mg by mouth daily with breakfast.    . folic acid (FOLVITE) 161 MCG tablet Take 400 mcg by mouth daily.    Marland Kitchen sulfamethoxazole-trimethoprim (BACTRIM DS,SEPTRA DS) 800-160 MG per tablet TAKE ONE TABLET BY MOUTH ON MONDAY,WEDNESDAY AND FRIDAY 36 tablet 3  . clobetasol cream (TEMOVATE) 0.96 % Apply 1  application topically 2 (two) times daily. (Patient not taking: Reported on 05/31/2015) 30 g 1  . doxycycline (VIBRA-TABS) 100 MG tablet Take 1 tablet (100 mg total) by mouth 2 (two) times daily. (Patient not taking: Reported on 08/30/2015) 14 tablet 0  . hydrOXYzine (ATARAX/VISTARIL) 25 MG tablet Take 1 tablet (25 mg total) by mouth 3 (three) times daily as needed. (Patient not taking: Reported on 05/31/2015) 60 tablet 1   No current facility-administered medications for this visit.    PHYSICAL EXAMINATION: ECOG PERFORMANCE STATUS: 0 - Asymptomatic  BP 160/76 mmHg  Pulse 77  Temp(Src) 99.2 F (37.3 C) (Tympanic)  Wt 140 lb 15.8 oz (63.95 kg)  Filed Weights   08/30/15 0907  Weight: 140 lb 15.8 oz (63.95 kg)    GENERAL: Well-nourished well-developed; Alert, no distress and comfortable.   Alone.  EYES: no pallor or icterus OROPHARYNX: no thrush or ulceration; good dentition  NECK: supple, no masses felt LYMPH:  ~1cm palpable lymphadenopathy in bil axillary  & inguinal regions LUNGS: clear to auscultation and  No wheeze or crackles HEART/CVS: regular rate & rhythm and no murmurs; No lower extremity edema ABDOMEN:abdomen soft, non-tender and normal bowel sounds. ? Mild splenomegaly.  Musculoskeletal:no cyanosis of digits and no clubbing  PSYCH: alert & oriented x 3 with fluent speech NEURO: no focal motor/sensory deficits SKIN:  no rashes or significant lesions  LABORATORY DATA:  I have reviewed  the data as listed    Component Value Date/Time   NA 144 03/26/2013 0823   K 4.0 03/26/2013 0823   CL 106 03/26/2013 0823   CO2 28 03/26/2013 0823   GLUCOSE 96 03/26/2013 0823   BUN 12 03/26/2013 0823   CREATININE 0.70 05/31/2015 0829   CREATININE 0.64 12/17/2013 0947   CALCIUM 8.6 03/26/2013 0823   PROT 7.0 04/07/2015 0857   PROT 6.6 12/17/2013 0947   ALBUMIN 4.5 04/07/2015 0857   ALBUMIN 4.0 12/17/2013 0947   AST 19 04/07/2015 0857   AST 19 12/17/2013 0947   ALT 15 04/07/2015  0857   ALT 26 12/17/2013 0947   ALKPHOS 57 04/07/2015 0857   ALKPHOS 63 12/17/2013 0947   BILITOT 0.7 04/07/2015 0857   BILITOT 0.4 12/17/2013 0947   GFRNONAA >60 05/31/2015 0829   GFRNONAA >60 09/24/2013 0854   GFRNONAA >60 10/04/2011 0823   GFRAA >60 05/31/2015 0829   GFRAA >60 09/24/2013 0854   GFRAA >60 10/04/2011 0823    No results found for: SPEP, UPEP  Lab Results  Component Value Date   WBC 57.7* 05/31/2015   NEUTROABS 4.7 05/31/2015   HGB 14.3 05/31/2015   HCT 44.9 05/31/2015   MCV 95.1 05/31/2015   PLT 126* 05/31/2015      Chemistry      Component Value Date/Time   NA 144 03/26/2013 0823   K 4.0 03/26/2013 0823   CL 106 03/26/2013 0823   CO2 28 03/26/2013 0823   BUN 12 03/26/2013 0823   CREATININE 0.70 05/31/2015 0829   CREATININE 0.64 12/17/2013 0947      Component Value Date/Time   CALCIUM 8.6 03/26/2013 0823   ALKPHOS 57 04/07/2015 0857   ALKPHOS 63 12/17/2013 0947   AST 19 04/07/2015 0857   AST 19 12/17/2013 0947   ALT 15 04/07/2015 0857   ALT 26 12/17/2013 0947   BILITOT 0.7 04/07/2015 0857   BILITOT 0.4 12/17/2013 0947       ASSESSMENT & PLAN:   # CLL/  recurrent stage IV- most recent treatment single agent Rituxan 2012. Patient's absolute lymphocyte count is 62,000; up from 20,000 in April 2016. Lymphocyte doubling count approximate 6 months. However hemoglobin-normal at 14; platelets mildly low at 107/stable. Patient continues to be asymptomatic clinically. Treatment will be recommended only if patient is symptomatic.   # I again had a discussion the patient regarding the multiple treatment options available for CLL at this time including- Ibrutinib; Gazyva; and again bendamustine and rituximab. I do not think patient is a candidate for FCR which she previously had.  # With the likely progression of CLL-today awaiting on peripheral blood for fish abnormalities/ IGV H mutation status at next visit.  #  follow-up 3 months with labs or sooner  if symptomatic.   # 15 minutes face-to-face with the patient discussing the above plan of care; more than 50% of time spent on natural history; counseling and coordination.    Cammie Sickle, MD 08/30/2015 9:12 AM

## 2015-09-03 LAB — FISH HES LEUKEMIA, 4Q12 REA

## 2015-09-08 LAB — MISC LABCORP TEST (SEND OUT): Labcorp test code: 113753

## 2015-09-21 ENCOUNTER — Telehealth: Payer: Self-pay | Admitting: Internal Medicine

## 2015-09-21 NOTE — Telephone Encounter (Signed)
Patient came in very distressed. She said she is a poor woman who can not afford to come to the doctor now that we have the facility charge as well as the professional charge. She said to please tell Dr. Rogue Bussing that she can only afford to come once per year. If you can talk to her over the phone that is fine but she can not afford to come in person but one time per year because this is too expensive now.

## 2015-10-04 NOTE — Telephone Encounter (Signed)
Given financial issues; Pt okay to have labs done in 3 months/ as scheduled; and not see me in 3 months. She will still need to see me in 6 months/labs. I would not recommend once a year check.

## 2015-10-05 NOTE — Telephone Encounter (Signed)
I called pt and let her know that we are changing the appt in 3 months for labs and then labs and see md in 6 months.  Pt somewhat agreeable.  She would prefer that if her labs are ok in 3 months then get 6 months labs and then if she needs to see md then she will come in but if she is going to cont. Lab monitoring then cancel the md portion.  I told her we will have to go visit to visit. I would send her a financial profile to fill out to see if she would qualify for asst.  She is agreeable to this.  Will send message to Mickel Baas to change the sch.

## 2015-10-10 ENCOUNTER — Other Ambulatory Visit: Payer: Self-pay | Admitting: *Deleted

## 2015-10-10 DIAGNOSIS — C9110 Chronic lymphocytic leukemia of B-cell type not having achieved remission: Secondary | ICD-10-CM

## 2015-11-25 ENCOUNTER — Other Ambulatory Visit: Payer: PPO

## 2015-11-25 ENCOUNTER — Inpatient Hospital Stay: Payer: PPO | Attending: Internal Medicine

## 2015-11-25 DIAGNOSIS — C9110 Chronic lymphocytic leukemia of B-cell type not having achieved remission: Secondary | ICD-10-CM | POA: Diagnosis not present

## 2015-11-25 LAB — COMPREHENSIVE METABOLIC PANEL
ALT: 16 U/L (ref 14–54)
AST: 22 U/L (ref 15–41)
Albumin: 4.5 g/dL (ref 3.5–5.0)
Alkaline Phosphatase: 56 U/L (ref 38–126)
Anion gap: 4 — ABNORMAL LOW (ref 5–15)
BUN: 17 mg/dL (ref 6–20)
CO2: 28 mmol/L (ref 22–32)
Calcium: 9.1 mg/dL (ref 8.9–10.3)
Chloride: 110 mmol/L (ref 101–111)
Creatinine, Ser: 0.61 mg/dL (ref 0.44–1.00)
GFR calc Af Amer: >60 mL/min (ref >60)
GFR calc non Af Amer: >60 mL/min (ref >60)
Glucose, Bld: 96 mg/dL (ref 65–99)
Potassium: 5.1 mmol/L (ref 3.5–5.1)
Sodium: 142 mmol/L (ref 135–145)
Total Bilirubin: 0.7 mg/dL (ref 0.3–1.2)
Total Protein: 6.5 g/dL (ref 6.5–8.1)

## 2015-11-25 LAB — CBC WITH DIFFERENTIAL/PLATELET
Basophils Absolute: 0.1 10*3/uL (ref 0–0.1)
EOS ABS: 0.2 10*3/uL (ref 0–0.7)
HCT: 41.3 % (ref 35.0–47.0)
HEMOGLOBIN: 12.9 g/dL (ref 12.0–16.0)
Lymphocytes Relative: 95 %
Lymphs Abs: 70.4 10*3/uL — ABNORMAL HIGH (ref 1.0–3.6)
MCH: 30.7 pg (ref 26.0–34.0)
MCHC: 31.2 g/dL — AB (ref 32.0–36.0)
MCV: 98.4 fL (ref 80.0–100.0)
Monocytes Absolute: 0.9 10*3/uL (ref 0.2–0.9)
Monocytes Relative: 1 %
Neutro Abs: 3.1 10*3/uL (ref 1.4–6.5)
PLATELETS: 95 10*3/uL — AB (ref 150–440)
RBC: 4.2 MIL/uL (ref 3.80–5.20)
RDW: 15.8 % — ABNORMAL HIGH (ref 11.5–14.5)
WBC: 74.7 10*3/uL — AB (ref 3.6–11.0)

## 2015-11-25 LAB — LACTATE DEHYDROGENASE: LDH: 176 U/L (ref 98–192)

## 2015-11-28 NOTE — Progress Notes (Signed)
Left vm message for patient.

## 2015-11-29 ENCOUNTER — Ambulatory Visit: Payer: PPO | Admitting: Internal Medicine

## 2016-02-24 ENCOUNTER — Inpatient Hospital Stay: Payer: PPO | Attending: Internal Medicine

## 2016-02-24 DIAGNOSIS — F17210 Nicotine dependence, cigarettes, uncomplicated: Secondary | ICD-10-CM | POA: Insufficient documentation

## 2016-02-24 DIAGNOSIS — L299 Pruritus, unspecified: Secondary | ICD-10-CM | POA: Insufficient documentation

## 2016-02-24 DIAGNOSIS — E538 Deficiency of other specified B group vitamins: Secondary | ICD-10-CM | POA: Diagnosis not present

## 2016-02-24 DIAGNOSIS — C9110 Chronic lymphocytic leukemia of B-cell type not having achieved remission: Secondary | ICD-10-CM | POA: Diagnosis not present

## 2016-02-24 DIAGNOSIS — Z79899 Other long term (current) drug therapy: Secondary | ICD-10-CM | POA: Insufficient documentation

## 2016-02-24 LAB — CBC WITH DIFFERENTIAL/PLATELET
BASOS ABS: 0.1 10*3/uL (ref 0–0.1)
Basophils Relative: 0 %
Eosinophils Absolute: 0.3 10*3/uL (ref 0–0.7)
Eosinophils Relative: 0 %
HCT: 42.6 % (ref 35.0–47.0)
HEMOGLOBIN: 13.2 g/dL (ref 12.0–16.0)
LYMPHS ABS: 98.2 10*3/uL — AB (ref 1.0–3.6)
MCH: 31.3 pg (ref 26.0–34.0)
MCHC: 31 g/dL — ABNORMAL LOW (ref 32.0–36.0)
MCV: 101 fL — AB (ref 80.0–100.0)
Monocytes Absolute: 1.8 10*3/uL — ABNORMAL HIGH (ref 0.2–0.9)
Monocytes Relative: 2 %
Neutro Abs: 4.3 10*3/uL (ref 1.4–6.5)
PLATELETS: 95 10*3/uL — AB (ref 150–440)
RBC: 4.22 MIL/uL (ref 3.80–5.20)
RDW: 15.1 % — ABNORMAL HIGH (ref 11.5–14.5)
WBC: 104.8 10*3/uL — AB (ref 3.6–11.0)

## 2016-02-24 LAB — COMPREHENSIVE METABOLIC PANEL
ALK PHOS: 55 U/L (ref 38–126)
ALT: 12 U/L — AB (ref 14–54)
AST: 19 U/L (ref 15–41)
Albumin: 4.7 g/dL (ref 3.5–5.0)
Anion gap: 6 (ref 5–15)
BUN: 21 mg/dL — ABNORMAL HIGH (ref 6–20)
CHLORIDE: 106 mmol/L (ref 101–111)
CO2: 27 mmol/L (ref 22–32)
CREATININE: 0.76 mg/dL (ref 0.44–1.00)
Calcium: 9 mg/dL (ref 8.9–10.3)
GLUCOSE: 97 mg/dL (ref 65–99)
Potassium: 4.8 mmol/L (ref 3.5–5.1)
Sodium: 139 mmol/L (ref 135–145)
Total Bilirubin: 0.7 mg/dL (ref 0.3–1.2)
Total Protein: 6.7 g/dL (ref 6.5–8.1)

## 2016-02-24 LAB — LACTATE DEHYDROGENASE: LDH: 166 U/L (ref 98–192)

## 2016-02-28 ENCOUNTER — Other Ambulatory Visit: Payer: Self-pay

## 2016-02-28 ENCOUNTER — Inpatient Hospital Stay (HOSPITAL_BASED_OUTPATIENT_CLINIC_OR_DEPARTMENT_OTHER): Payer: PPO | Admitting: Internal Medicine

## 2016-02-28 DIAGNOSIS — F17210 Nicotine dependence, cigarettes, uncomplicated: Secondary | ICD-10-CM

## 2016-02-28 DIAGNOSIS — E538 Deficiency of other specified B group vitamins: Secondary | ICD-10-CM | POA: Diagnosis not present

## 2016-02-28 DIAGNOSIS — Z79899 Other long term (current) drug therapy: Secondary | ICD-10-CM

## 2016-02-28 DIAGNOSIS — C9110 Chronic lymphocytic leukemia of B-cell type not having achieved remission: Secondary | ICD-10-CM

## 2016-02-28 DIAGNOSIS — L299 Pruritus, unspecified: Secondary | ICD-10-CM | POA: Diagnosis not present

## 2016-02-28 NOTE — Progress Notes (Signed)
[facility] OFFICE PROGRESS NOTE  No care team member to display  No matching staging information was found for the patient.   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  female pleasant patient above history of CLL relapsed currently on surveillance is here for follow-up. Patient admits to fairly good energy. She is exercising. Denies any Unusual weight loss or bumps or lumps. No night sweats. No recent infections or admission the hospital.   REVIEW OF SYSTEMS:  A complete 10 point review of system is done which is negative except mentioned above/history of present illness.   PAST MEDICAL HISTORY :  Past Medical History:  Diagnosis Date  . CLL (chronic lymphocytic leukemia)   . Itching   . Vitamin B 12 deficiency     PAST SURGICAL HISTORY :   Past Surgical History:  Procedure Laterality Date  . breeast mass removed  1988   benign    FAMILY HISTORY :  No family history on file.  SOCIAL HISTORY:   Social History  Substance Use Topics  . Smoking status: Current Every Day Smoker    Packs/day: 0.50    Types: Cigarettes  . Smokeless tobacco: Never Used  . Alcohol use Yes     Comment: occ.    ALLERGIES:  has no allergies on file.  MEDICATIONS:  Current Outpatient Prescriptions  Medication Sig Dispense Refill  . acyclovir (ZOVIRAX) 200 MG capsule TAKE ONE CAPSULE BY MOUTH THREE TIMES DAILY 90 capsule 0  . clobetasol cream (TEMOVATE) 1.69  % Apply 1 application topically 2 (two) times daily. 30 g 1  . doxycycline (VIBRA-TABS) 100 MG tablet Take 1 tablet (100 mg total) by mouth 2 (two) times daily. 14 tablet 0  . ferrous sulfate 325 (65 FE) MG tablet Take 325 mg by mouth daily with breakfast.    . folic acid (FOLVITE) 678 MCG tablet Take 400 mcg by mouth daily.    . hydrOXYzine (ATARAX/VISTARIL) 25 MG tablet Take 1 tablet (25 mg total) by mouth 3 (three) times daily as needed. 60 tablet 1  . sulfamethoxazole-trimethoprim (BACTRIM DS,SEPTRA DS) 800-160 MG per tablet TAKE ONE TABLET BY MOUTH ON MONDAY,WEDNESDAY AND FRIDAY 36 tablet 3   No current facility-administered medications for this visit.     PHYSICAL EXAMINATION: ECOG PERFORMANCE STATUS: 0 - Asymptomatic  BP 138/71 (BP Location: Left Arm)   Pulse 69   Temp 98.6 F (37 C) (Tympanic)   Resp 18   Wt 137 lb 12.6 oz (62.5 kg)   BMI 21.58 kg/m   Filed Weights   02/28/16 0959  Weight: 137 lb 12.6 oz (62.5 kg)    GENERAL: Well-nourished well-developed; Alert, no distress and comfortable.   Alone.  EYES: no pallor or icterus OROPHARYNX: no thrush or ulceration; good dentition  NECK: supple, no masses felt LYMPH:  no palpable lymphadenopathy in the cervical, axillary or inguinal regions LUNGS: clear to auscultation and  No wheeze or crackles HEART/CVS:  regular rate & rhythm and no murmurs; No lower extremity edema ABDOMEN:abdomen soft, non-tender and normal bowel sounds Musculoskeletal:no cyanosis of digits and no clubbing  PSYCH: alert & oriented x 3 with fluent speech NEURO: no focal motor/sensory deficits SKIN:  no rashes or significant lesions  LABORATORY DATA:  I have reviewed the data as listed    Component Value Date/Time   NA 139 02/24/2016 1032   NA 144 03/26/2013 0823   K 4.8 02/24/2016 1032   K 4.0 03/26/2013 0823   CL 106 02/24/2016 1032   CL 106 03/26/2013 0823   CO2 27 02/24/2016 1032   CO2 28 03/26/2013 0823   GLUCOSE 97 02/24/2016 1032    GLUCOSE 96 03/26/2013 0823   BUN 21 (H) 02/24/2016 1032   BUN 12 03/26/2013 0823   CREATININE 0.76 02/24/2016 1032   CREATININE 0.64 12/17/2013 0947   CALCIUM 9.0 02/24/2016 1032   CALCIUM 8.6 03/26/2013 0823   PROT 6.7 02/24/2016 1032   PROT 6.6 12/17/2013 0947   ALBUMIN 4.7 02/24/2016 1032   ALBUMIN 4.0 12/17/2013 0947   AST 19 02/24/2016 1032   AST 19 12/17/2013 0947   ALT 12 (L) 02/24/2016 1032   ALT 26 12/17/2013 0947   ALKPHOS 55 02/24/2016 1032   ALKPHOS 63 12/17/2013 0947   BILITOT 0.7 02/24/2016 1032   BILITOT 0.4 12/17/2013 0947   GFRNONAA >60 02/24/2016 1032   GFRNONAA >60 09/24/2013 0854   GFRAA >60 02/24/2016 1032   GFRAA >60 09/24/2013 0854    No results found for: SPEP, UPEP  Lab Results  Component Value Date   WBC 104.8 (HH) 02/24/2016   NEUTROABS 4.3 02/24/2016   HGB 13.2 02/24/2016   HCT 42.6 02/24/2016   MCV 101.0 (H) 02/24/2016   PLT 95 (L) 02/24/2016      Chemistry      Component Value Date/Time   NA 139 02/24/2016 1032   NA 144 03/26/2013 0823   K 4.8 02/24/2016 1032   K 4.0 03/26/2013 0823   CL 106 02/24/2016 1032   CL 106 03/26/2013 0823   CO2 27 02/24/2016 1032   CO2 28 03/26/2013 0823   BUN 21 (H) 02/24/2016 1032   BUN 12 03/26/2013 0823   CREATININE 0.76 02/24/2016 1032   CREATININE 0.64 12/17/2013 0947      Component Value Date/Time   CALCIUM 9.0 02/24/2016 1032   CALCIUM 8.6 03/26/2013 0823   ALKPHOS 55 02/24/2016 1032   ALKPHOS 63 12/17/2013 0947   AST 19 02/24/2016 1032   AST 19 12/17/2013 0947   ALT 12 (L) 02/24/2016 1032   ALT 26 12/17/2013 0947   BILITOT 0.7 02/24/2016 1032   BILITOT 0.4 12/17/2013 0947       RADIOGRAPHIC STUDIES: I have personally reviewed the radiological images as listed and agreed with the findings in the report. No results found.   ASSESSMENT & PLAN:  CLL (chronic lymphocytic leukemia) ([place]) # CLL/  recurrent stage IV- most recent treatment single agent Rituxan 2012.  Patient's white  count is 106,000; with normal hemoglobin; platelets 95/slow decline. Discussed given the trend of her blood counts- I think she will need the treatments sooner than later. Patient continues to be asymptomatic clinically. Treatment will be recommended only if patient is symptomatic.   # I again had a discussion the patient regarding the multiple treatment options available for CLL at this time including- Ibrutinib [especially since I GVH mutated]; Gazyva; and again bendamustine and rituximab.   # follow  up labs every 2 months/ follow up in 6 months [pt preference/ sec to financial issues]  # # 15 minutes face-to-face with the patient discussing the above plan of care; more than 50% of time spent on natural history; counseling and coordination.   No orders of the defined types were placed in this encounter.  All questions were answered. The patient knows to call the clinic with any problems, questions or concerns.      Cammie Sickle, MD 02/28/2016 11:21 AM

## 2016-02-28 NOTE — Assessment & Plan Note (Addendum)
#   CLL/  recurrent stage IV- most recent treatment single agent Rituxan 2012.  Patient's white count is 106,000; with normal hemoglobin; platelets 95/slow decline. Discussed given the trend of her blood counts- I think she will need the treatments sooner than later. Patient continues to be asymptomatic clinically. Treatment will be recommended only if patient is symptomatic.   # I again had a discussion the patient regarding the multiple treatment options available for CLL at this time including- Ibrutinib [especially since I GVH mutated]; Gazyva; and again bendamustine and rituximab.   # follow up labs every 2 months/ follow up in 6 months [pt preference/ sec to financial issues]  # # 15 minutes face-to-face with the patient discussing the above plan of care; more than 50% of time spent on natural history; counseling and coordination.

## 2016-02-28 NOTE — Progress Notes (Signed)
Patient states it has been two years since her chemo treatment and her hair has not grown back except the middle of her head.

## 2016-03-20 ENCOUNTER — Ambulatory Visit (INDEPENDENT_AMBULATORY_CARE_PROVIDER_SITE_OTHER): Payer: PPO | Admitting: Family Medicine

## 2016-03-20 ENCOUNTER — Other Ambulatory Visit: Payer: Self-pay | Admitting: *Deleted

## 2016-03-20 ENCOUNTER — Encounter: Payer: Self-pay | Admitting: Family Medicine

## 2016-03-20 VITALS — BP 120/80 | HR 80 | Ht 67.0 in | Wt 136.0 lb

## 2016-03-20 DIAGNOSIS — L309 Dermatitis, unspecified: Secondary | ICD-10-CM | POA: Diagnosis not present

## 2016-03-20 DIAGNOSIS — L308 Other specified dermatitis: Secondary | ICD-10-CM

## 2016-03-20 MED ORDER — EUCERIN EX LOTN: TOPICAL_LOTION | CUTANEOUS | 0 refills | Status: DC | PRN

## 2016-03-20 MED ORDER — ACYCLOVIR 200 MG PO CAPS
200.0000 mg | ORAL_CAPSULE | Freq: Three times a day (TID) | ORAL | 0 refills | Status: DC
Start: 2016-03-20 — End: 2016-07-24

## 2016-03-20 MED ORDER — LORATADINE 10 MG PO TABS: 10.0000 mg | ORAL_TABLET | Freq: Every day | ORAL | 11 refills | Status: DC

## 2016-03-20 MED ORDER — BETAMETHASONE DIPROPIONATE 0.05 % EX CREA: TOPICAL_CREAM | Freq: Two times a day (BID) | CUTANEOUS | 11 refills | Status: DC

## 2016-03-20 NOTE — Progress Notes (Signed)
Name: Maureen [name]   MRN: [id number]    DOB: 1941/01/03   Date:03/20/2016       Progress Note  Subjective  Chief Complaint  Chief Complaint  Patient presents with  . Belepharitis    started 1 day ago- has a blister on back of leg- is on Sulfa drug 3 days a week and doxy as needed from cancer center    Rash  This is a chronic problem. The current episode started in the past 7 days. The problem has been gradually worsening since onset. The affected locations include the right ankle. The rash is characterized by blistering and itchiness. Pertinent negatives include no cough, diarrhea, fever, joint pain, shortness of breath or sore throat. Past treatments include antihistamine and topical steroids. The treatment provided mild relief. Her past medical history is significant for eczema.    No problem-specific Assessment & Plan notes found for this encounter.   Past Medical History:  Diagnosis Date  . Anemia   . CLL (chronic lymphocytic leukemia) ([place])   . Itching   . Vitamin B 12 deficiency     Past Surgical History:  Procedure Laterality Date  . breeast mass removed  1988   benign    No family history on file.  Social History   Social History  . Marital status: Divorced    Spouse name: N/A  . Number of children: N/A  . Years of education: N/A   Occupational History  . Not on file.   Social History Main Topics  . Smoking status: Current Every Day Smoker    Packs/day: 0.50    Types: Cigarettes  . Smokeless tobacco: Never Used  . Alcohol use Yes     Comment: occ.  . Drug use: No  . Sexual activity: Not Currently   Other Topics Concern  . Not on file   Social History Narrative  . No narrative on file    No Known Allergies   Review of Systems  Constitutional: Negative for chills, fever, malaise/fatigue and weight loss.  HENT: Negative for ear discharge, ear pain and sore throat.   Eyes: Negative for blurred vision.  Respiratory: Negative for cough, sputum  production, shortness of breath and wheezing.   Cardiovascular: Negative for chest pain, palpitations and leg swelling.  Gastrointestinal: Negative for abdominal pain, blood in stool, constipation, diarrhea, heartburn, melena and nausea.  Genitourinary: Negative for dysuria, frequency, hematuria and urgency.  Musculoskeletal: Negative for back pain, joint pain, myalgias and neck pain.  Skin: Negative for rash.  Neurological: Negative for dizziness, tingling, sensory change, focal weakness and headaches.  Endo/Heme/Allergies: Negative for environmental allergies and polydipsia. Does not bruise/bleed easily.  Psychiatric/Behavioral: Negative for depression and suicidal ideas. The patient is not nervous/anxious and does not have insomnia.      Objective  Vitals:   03/20/16 0840  BP: 120/80  Pulse: 80  Weight: 136 lb (61.7 kg)  Height: '5\' 7"'$  (1.702 m)    Physical Exam  Constitutional: She is well-developed, well-nourished, and in no distress. No distress.  HENT:  Head: Normocephalic and atraumatic.  Right Ear: External ear normal.  Left Ear: External ear normal.  Nose: Nose normal.  Mouth/Throat: Oropharynx is clear and moist.  Eyes: Conjunctivae and EOM are normal. Pupils are equal, round, and reactive to light. Right eye exhibits no discharge. Left eye exhibits no discharge.  Neck: Normal range of motion. Neck supple. No JVD present. No thyromegaly present.  Cardiovascular: Normal rate, regular rhythm, normal heart sounds  and intact distal pulses.  Exam reveals no gallop and no friction rub.   No murmur heard. Pulmonary/Chest: Effort normal and breath sounds normal.  Abdominal: Soft. Bowel sounds are normal. She exhibits no mass. There is no tenderness. There is no guarding.  Musculoskeletal: Normal range of motion. She exhibits no edema.  Lymphadenopathy:    She has no cervical adenopathy.  Neurological: She is alert. She has normal reflexes.  Skin: Skin is warm and dry. Rash  noted. Rash is vesicular. Rash is not nodular and not pustular. She is not diaphoretic. There is erythema.     Large blister/ no erythema/ hx of spongiotic dermatitis/ resume temovate/ claritn   Psychiatric: Mood and affect normal.  Nursing note and vitals reviewed.     Assessment & Plan  Problem List Items Addressed This Visit    None    Visit Diagnoses    Spongiotic vesicular dermatitis    -  Primary   area cleaned with betadine / decompressed with sterilile needle/ dressed    Relevant Medications   loratadine (CLARITIN) 10 MG tablet   Emollient (EUCERIN) lotion   betamethasone dipropionate (DIPROLENE) 0.05 % cream        Dr. Deanna Jones [facility]  03/20/16

## 2016-03-28 ENCOUNTER — Other Ambulatory Visit: Payer: Self-pay | Admitting: *Deleted

## 2016-03-28 MED ORDER — SULFAMETHOXAZOLE-TRIMETHOPRIM 800-160 MG PO TABS: ORAL_TABLET | ORAL | 3 refills | Status: DC

## 2016-04-09 ENCOUNTER — Other Ambulatory Visit: Payer: Self-pay

## 2016-05-01 ENCOUNTER — Inpatient Hospital Stay: Payer: PPO | Attending: Internal Medicine

## 2016-05-01 ENCOUNTER — Telehealth: Payer: Self-pay

## 2016-05-01 DIAGNOSIS — C9110 Chronic lymphocytic leukemia of B-cell type not having achieved remission: Secondary | ICD-10-CM | POA: Diagnosis not present

## 2016-05-01 LAB — CBC WITH DIFFERENTIAL/PLATELET
BASOS PCT: 0 %
Basophils Absolute: 0.1 10*3/uL (ref 0–0.1)
EOS ABS: 0.1 10*3/uL (ref 0–0.7)
EOS PCT: 0 %
HCT: 37.6 % (ref 35.0–47.0)
HEMOGLOBIN: 11.8 g/dL — AB (ref 12.0–16.0)
LYMPHS ABS: 141.1 10*3/uL — AB (ref 1.0–3.6)
Lymphocytes Relative: 96 %
MCH: 32.3 pg (ref 26.0–34.0)
MCHC: 31.4 g/dL — AB (ref 32.0–36.0)
MCV: 103 fL — ABNORMAL HIGH (ref 80.0–100.0)
MONOS PCT: 1 %
Monocytes Absolute: 1.8 10*3/uL — ABNORMAL HIGH (ref 0.2–0.9)
NEUTROS PCT: 3 %
Neutro Abs: 4.2 10*3/uL (ref 1.4–6.5)
PLATELETS: 105 10*3/uL — AB (ref 150–440)
RBC: 3.65 MIL/uL — ABNORMAL LOW (ref 3.80–5.20)
RDW: 16.2 % — AB (ref 11.5–14.5)
WBC: 147.3 10*3/uL (ref 3.6–11.0)

## 2016-05-01 LAB — LACTATE DEHYDROGENASE: LDH: 173 U/L (ref 98–192)

## 2016-05-01 NOTE — Telephone Encounter (Signed)
Critical labs called at 1027 to [facility] in infusion.  WBC of 147.  MD notified by myself.  MD advised I call pt and have her come in to see him.  Pt verbalized that she would like to see him on 12/19 when she has her lab appt. Pt verbalized no other concerns was made aware of her labs today.

## 2016-07-03 ENCOUNTER — Inpatient Hospital Stay (HOSPITAL_BASED_OUTPATIENT_CLINIC_OR_DEPARTMENT_OTHER): Payer: PPO | Admitting: Internal Medicine

## 2016-07-03 ENCOUNTER — Inpatient Hospital Stay: Payer: PPO | Attending: Internal Medicine

## 2016-07-03 VITALS — BP 130/78 | HR 79 | Temp 98.9°F | Resp 18 | Wt 131.0 lb

## 2016-07-03 DIAGNOSIS — L299 Pruritus, unspecified: Secondary | ICD-10-CM | POA: Diagnosis not present

## 2016-07-03 DIAGNOSIS — C9110 Chronic lymphocytic leukemia of B-cell type not having achieved remission: Secondary | ICD-10-CM

## 2016-07-03 DIAGNOSIS — R5383 Other fatigue: Secondary | ICD-10-CM | POA: Diagnosis not present

## 2016-07-03 DIAGNOSIS — R634 Abnormal weight loss: Secondary | ICD-10-CM

## 2016-07-03 DIAGNOSIS — F17210 Nicotine dependence, cigarettes, uncomplicated: Secondary | ICD-10-CM | POA: Diagnosis not present

## 2016-07-03 DIAGNOSIS — Z79899 Other long term (current) drug therapy: Secondary | ICD-10-CM | POA: Diagnosis not present

## 2016-07-03 DIAGNOSIS — R531 Weakness: Secondary | ICD-10-CM

## 2016-07-03 DIAGNOSIS — E538 Deficiency of other specified B group vitamins: Secondary | ICD-10-CM | POA: Insufficient documentation

## 2016-07-03 LAB — CBC WITH DIFFERENTIAL/PLATELET
Basophils Absolute: 0 10*3/uL (ref 0–0.1)
Basophils Relative: 0 %
EOS ABS: 0 10*3/uL (ref 0–0.7)
EOS PCT: 0 %
HCT: 36.6 % (ref 35.0–47.0)
HEMOGLOBIN: 11.5 g/dL — AB (ref 12.0–16.0)
LYMPHS ABS: 169.3 10*3/uL — AB (ref 1.0–3.6)
Lymphocytes Relative: 97 %
MCH: 33.7 pg (ref 26.0–34.0)
MCHC: 31.4 g/dL — AB (ref 32.0–36.0)
MCV: 107.4 fL — ABNORMAL HIGH (ref 80.0–100.0)
MONOS PCT: 1 %
Monocytes Absolute: 1.7 10*3/uL — ABNORMAL HIGH (ref 0.2–0.9)
Neutro Abs: 4.2 10*3/uL (ref 1.4–6.5)
Neutrophils Relative %: 2 %
Platelets: 93 10*3/uL — ABNORMAL LOW (ref 150–440)
RBC: 3.41 MIL/uL — ABNORMAL LOW (ref 3.80–5.20)
RDW: 17.1 % — ABNORMAL HIGH (ref 11.5–14.5)
WBC: 175.2 10*3/uL (ref 3.6–11.0)

## 2016-07-03 LAB — LACTATE DEHYDROGENASE: LDH: 167 U/L (ref 98–192)

## 2016-07-03 NOTE — Progress Notes (Signed)
START ON PATHWAY REGIMEN - Lymphoma and CLL  [id number]: Obinutuzumab + Chlorambucil q28 Days x 6 Cycles   A cycle is every 28 days:     Obinutuzumab [facility])) 100 mg flat dose in 100 mL NS IV over 4 hours on day 1 cycle 1 only. Dose Mod: None     Obinutuzumab (Gazyva(R)) 900 mg flat dose in 250 mL NS IV titrated to a max rate of 400 mg/hr on day 2 cycle 1 only. Initial rate of 50 mg/hr may be increased by 50 mg/hr every 30 minutes if tolerated. Dose Mod: None     Obinutuzumab (Gazyva(R)) 1000 mg flat dose in 250 mL NS IV titrated to a max rate of 400 mg/hr on day 8 and day 15 of cycle 1 only.  Initial rate of 100 mg/hr may be increased by 100 mg/hr every 30 minutes if tolerated. Dose Mod: None     Obinutuzumab (Gazyva(R)) 1000 mg flat dose in 250 mL NS IV titrated to a max rate of 400 mg/hr on day 1 only of cycles 2 through 6. Initial rate of 100 mg/hr may be increased by 100 mg/hr every 30 min if tolerated. Dose Mod: None     Chlorambucil (Leukeran(R)) 0.5 mg/kg Orally on days 1 and 15 of each cycle.  Round to the nearest 2 mg (tablet size). Dose Mod: None Additional Orders: Prescribing info recommends antimicrobial prophylaxis in neutropenic patients and consideration of antiviral/antifungal.  **Always confirm dose/schedule in your pharmacy ordering system**    Patient Characteristics: Chronic Lymphocytic Leukemia (CLL), First Line, Treatment Indicated, 17p del (-), Frail Patient Disease Type: Chronic Lymphocytic Leukemia (CLL) Disease Type: Not Applicable Line of therapy: First Line RAI Stage: IV Treatment Indicated? Treatment Indicated* 17p Deletion Status: Negative Fit or Frail* Patient? Frail Patient*  Intent of Therapy: Non-Curative / Palliative Intent, Discussed with Patient

## 2016-07-03 NOTE — Progress Notes (Signed)
Patient is here for follow up, she is doing ok

## 2016-07-03 NOTE — Progress Notes (Signed)
[facility] OFFICE PROGRESS NOTE  No care team member to display  No matching staging information was found for the patient.   Oncology History   # 2002- CLL [Dx- in [place] 2010- FCR x 4 cycles [Dr.Pandit]; Normal karyotype; PR; AUG 2012- Single agent Rituxan x7 [held sec to neutropenia];   # June 2015- Progression based on lymphocytosis- surveillance; Feb 2017- FISH- 13 q del/ IGVH MUTATED [good prognostic]  # Hx of skin rash [2012 s/p Bx-ant thigh lesion-vesicular spongiotic dermatitis]-resolved; B12 def [2012]     CLL (chronic lymphocytic leukemia) ([place])   02/28/2016 Initial Diagnosis    CLL (chronic lymphocytic leukemia) ([facility])       INTERVAL HISTORY:  Maureen [name] 76 y.o.  female pleasant patient above history of CLL relapsed currently on surveillance is here for follow-up. Patient admits to be being more tired in the last few months. She stopped her exercising for the same reason. Mild sweating. Not drenching. Complains of fatigue. She noted to have lumps in the neck in the groin. No pain.   Otherwise denies any fevers or chills. Appetite poor. Lost about 7 pounds.   REVIEW OF SYSTEMS:  A complete 10 point review of system is done which is negative except mentioned above/history of present illness.   PAST MEDICAL HISTORY :  Past Medical History:  Diagnosis Date  . Anemia   . CLL (chronic lymphocytic leukemia) ([place])   . Itching   . Vitamin B 12 deficiency     PAST SURGICAL HISTORY :   Past Surgical History:  Procedure Laterality Date  . breeast mass removed  1988   benign    FAMILY HISTORY :  No family history on file.  SOCIAL HISTORY:   Social History  Substance Use Topics  . Smoking status: Current Every Day Smoker    Packs/day: 0.50    Types: Cigarettes  . Smokeless tobacco: Never Used  . Alcohol use Yes     Comment: occ.    ALLERGIES:  has No Known Allergies.  MEDICATIONS:  Current Outpatient Prescriptions  Medication Sig  Dispense Refill  . acyclovir (ZOVIRAX) 200 MG capsule Take 1 capsule (200 mg total) by mouth 3 (three) times daily. 270 capsule 0  . betamethasone dipropionate (DIPROLENE) 0.05 % cream Apply topically 2 (two) times daily. 30 g 11  . doxycycline (VIBRA-TABS) 100 MG tablet Take 1 tablet (100 mg total) by mouth 2 (two) times daily. (Patient taking differently: Take 100 mg by mouth 2 (two) times daily. As needed from cancer center) 14 tablet 0  . Emollient (EUCERIN) lotion Apply topically as needed for dry skin. 240 mL 0  . ferrous sulfate 325 (65 FE) MG tablet Take 325 mg by mouth daily with breakfast.    . folic acid (FOLVITE) 833 MCG tablet Take 400 mcg by mouth daily.    . hydrOXYzine (ATARAX/VISTARIL) 25 MG tablet Take 1 tablet (25 mg total) by mouth 3 (three) times daily as needed. 60 tablet 1  . loratadine (CLARITIN) 10 MG tablet Take 1 tablet (10 mg total) by mouth daily. 30 tablet 11  . sulfamethoxazole-trimethoprim (BACTRIM DS,SEPTRA DS) 800-160 MG tablet TAKE ONE TABLET BY MOUTH ON MONDAY,WEDNESDAY AND FRIDAY 36 tablet 3   No current facility-administered medications for this visit.     PHYSICAL EXAMINATION: ECOG PERFORMANCE STATUS: 0 - Asymptomatic  BP 130/78 (BP Location: Left Arm, Patient Position: Sitting)   Pulse 79   Temp 98.9 F (37.2 C) (Tympanic)   Resp  18   Wt 130 lb 15.3 oz (59.4 kg)   BMI 20.51 kg/m   Filed Weights   07/03/16 0931  Weight: 130 lb 15.3 oz (59.4 kg)    GENERAL: Well-nourished well-developed; Alert, no distress and comfortable.   Alone.  EYES: no pallor or icterus OROPHARYNX: no thrush or ulceration; good dentition  NECK: supple, no masses felt LYMPH:  1-2 cm bilateral lymphadenopathy noted in  cervical, axillary or inguinal regions LUNGS: clear to auscultation and  No wheeze or crackles HEART/CVS: regular rate & rhythm and no murmurs; No lower extremity edema ABDOMEN:abdomen soft, non-tender and normal bowel sounds Musculoskeletal:no cyanosis  of digits and no clubbing  PSYCH: alert & oriented x 3 with fluent speech NEURO: no focal motor/sensory deficits SKIN:  no rashes or significant lesions; partial alopecia.   LABORATORY DATA:  I have reviewed the data as listed    Component Value Date/Time   NA 139 02/24/2016 1032   NA 144 03/26/2013 0823   K 4.8 02/24/2016 1032   K 4.0 03/26/2013 0823   CL 106 02/24/2016 1032   CL 106 03/26/2013 0823   CO2 27 02/24/2016 1032   CO2 28 03/26/2013 0823   GLUCOSE 97 02/24/2016 1032   GLUCOSE 96 03/26/2013 0823   BUN 21 (H) 02/24/2016 1032   BUN 12 03/26/2013 0823   CREATININE 0.76 02/24/2016 1032   CREATININE 0.64 12/17/2013 0947   CALCIUM 9.0 02/24/2016 1032   CALCIUM 8.6 03/26/2013 0823   PROT 6.7 02/24/2016 1032   PROT 6.6 12/17/2013 0947   ALBUMIN 4.7 02/24/2016 1032   ALBUMIN 4.0 12/17/2013 0947   AST 19 02/24/2016 1032   AST 19 12/17/2013 0947   ALT 12 (L) 02/24/2016 1032   ALT 26 12/17/2013 0947   ALKPHOS 55 02/24/2016 1032   ALKPHOS 63 12/17/2013 0947   BILITOT 0.7 02/24/2016 1032   BILITOT 0.4 12/17/2013 0947   GFRNONAA >60 02/24/2016 1032   GFRNONAA >60 09/24/2013 0854   GFRAA >60 02/24/2016 1032   GFRAA >60 09/24/2013 0854    No results found for: SPEP, UPEP  Lab Results  Component Value Date   WBC 175.2 (HH) 07/03/2016   NEUTROABS 4.2 07/03/2016   HGB 11.5 (L) 07/03/2016   HCT 36.6 07/03/2016   MCV 107.4 (H) 07/03/2016   PLT 93 (L) 07/03/2016      Chemistry      Component Value Date/Time   NA 139 02/24/2016 1032   NA 144 03/26/2013 0823   K 4.8 02/24/2016 1032   K 4.0 03/26/2013 0823   CL 106 02/24/2016 1032   CL 106 03/26/2013 0823   CO2 27 02/24/2016 1032   CO2 28 03/26/2013 0823   BUN 21 (H) 02/24/2016 1032   BUN 12 03/26/2013 0823   CREATININE 0.76 02/24/2016 1032   CREATININE 0.64 12/17/2013 0947      Component Value Date/Time   CALCIUM 9.0 02/24/2016 1032   CALCIUM 8.6 03/26/2013 0823   ALKPHOS 55 02/24/2016 1032   ALKPHOS 63  12/17/2013 0947   AST 19 02/24/2016 1032   AST 19 12/17/2013 0947   ALT 12 (L) 02/24/2016 1032   ALT 26 12/17/2013 0947   BILITOT 0.7 02/24/2016 1032   BILITOT 0.4 12/17/2013 0947       RADIOGRAPHIC STUDIES: I have personally reviewed the radiological images as listed and agreed with the findings in the report. No results found.   ASSESSMENT & PLAN:  CLL (chronic lymphocytic leukemia) ([place]) # CLL/  recurrent stage IV- today white count is 1 75,000 hemoglobin 11-23. However patient is getting more symptomatic [fatigue; weight loss; progressive lymphadenopathy].  # I would recommend initiation of treatment with Gazyva+ Chlorambucil; on a monthly basis 6. Discussed potential infusion reactions; risk of infections. Patient is interested.  # Also discussed regarding use of ibrutinib; bendamustine rituximab-pros and cons of each therapy.  # Before initiating therapy will check hepatitis panel; CBC CMP and LDH; fish for CLL. Also get a CT of the neck chest abdomen pelvis to evaluate the lymphadenopathy. Also printed prescriptions for acyclovir; Bactrim and also allopurinol; cholrambucil.   # Patient follow-up with me on January 9 to start treatment with Gazyva; Clorambucil.    Orders Placed This Encounter  Procedures  . CT ABDOMEN PELVIS W CONTRAST    Standing Status:   Future    Standing Expiration Date:   10/02/2017    Order Specific Question:   Reason for Exam (SYMPTOM  OR DIAGNOSIS REQUIRED)    Answer:   cll/sll    Order Specific Question:   Preferred imaging location?    Answer:   [facility]  . CT CHEST W CONTRAST    Standing Status:   Future    Standing Expiration Date:   09/02/2017    Order Specific Question:   Reason for Exam (SYMPTOM  OR DIAGNOSIS REQUIRED)    Answer:   cll/sll    Order Specific Question:   Preferred imaging location?    Answer:   [facility]  . CT SOFT TISSUE NECK W CONTRAST    Standing Status:   Future    Standing Expiration Date:   10/02/2017     Order Specific Question:   Reason for Exam (SYMPTOM  OR DIAGNOSIS REQUIRED)    Answer:   cll/sll    Order Specific Question:   Preferred imaging location?    Answer:   [facility]  . Hepatitis B core antibody, IgM    Standing Status:   Future    Standing Expiration Date:   08/07/2017  . Hepatitis B surface antigen    Standing Status:   Future    Standing Expiration Date:   08/07/2017  . FISH HES GLOVFIEP,3I95 REA    Standing Status:   Future    Standing Expiration Date:   08/07/2017  . CBC with Differential/Platelet    Standing Status:   Future    Standing Expiration Date:   08/07/2017  . Comprehensive metabolic panel    Standing Status:   Future    Standing Expiration Date:   08/07/2017  . Lactate dehydrogenase    Standing Status:   Future    Standing Expiration Date:   08/07/2017   All questions were answered. The patient knows to call the clinic with any problems, questions or concerns.      Cammie Sickle, MD 07/03/2016 11:17 AM

## 2016-07-03 NOTE — Assessment & Plan Note (Addendum)
#   CLL/  recurrent stage IV- today white count is 1 75,000 hemoglobin 11-23. However patient is getting more symptomatic [fatigue; weight loss; progressive lymphadenopathy].  # I would recommend initiation of treatment with Gazyva+ Chlorambucil; on a monthly basis 6. Discussed potential infusion reactions; risk of infections. Patient is interested.  # Also discussed regarding use of ibrutinib; bendamustine rituximab-pros and cons of each therapy.  # Before initiating therapy will check hepatitis panel; CBC CMP and LDH; fish for CLL. Also get a CT of the neck chest abdomen pelvis to evaluate the lymphadenopathy. Also printed prescriptions for acyclovir; Bactrim and also allopurinol; cholrambucil.   # Patient follow-up with me on January 9 to start treatment with Gazyva; Clorambucil.

## 2016-07-11 ENCOUNTER — Encounter: Payer: Self-pay | Admitting: *Deleted

## 2016-07-11 NOTE — Progress Notes (Signed)
Patient is to start new chemotherapy Gazyva on Jan 9th it is currently scheduled on Tues 07/24/16 in [place]. Due to extremely high reaction rate and slow titration rate, pharmacist and myself agree this treatment needs to be given in [place] where the emergency response team and the ER is there. Also MD needs to be available/on premises to respond to a possible reaction. The patients appointments need to be Weds 07/25/16 lab/seeMD/Gazyva (6Hours) and Thurs 07/26/16 Chemo Gazyva (6hours)  Both in [place]

## 2016-07-11 NOTE — Progress Notes (Signed)
Agree with Maureen [name] regarding scheduling tx in [place]. Msg sent to scheduling team to arrange apts in [facility] for day 1 and day 2.

## 2016-07-18 ENCOUNTER — Ambulatory Visit: Payer: PPO

## 2016-07-18 ENCOUNTER — Ambulatory Visit: Admission: RE | Admit: 2016-07-18 | Payer: PPO | Source: Ambulatory Visit

## 2016-07-20 ENCOUNTER — Ambulatory Visit: Payer: PPO

## 2016-07-24 ENCOUNTER — Ambulatory Visit: Payer: PPO | Admitting: Internal Medicine

## 2016-07-24 ENCOUNTER — Other Ambulatory Visit: Payer: PPO

## 2016-07-24 ENCOUNTER — Ambulatory Visit: Payer: PPO

## 2016-07-24 ENCOUNTER — Telehealth: Payer: Self-pay | Admitting: Internal Medicine

## 2016-07-24 ENCOUNTER — Inpatient Hospital Stay: Payer: PPO

## 2016-07-24 ENCOUNTER — Ambulatory Visit
Admission: RE | Admit: 2016-07-24 | Discharge: 2016-07-24 | Disposition: A | Discharge status: Home / Self Care | Payer: PPO | Source: Ambulatory Visit | Attending: Internal Medicine | Admitting: Internal Medicine

## 2016-07-24 ENCOUNTER — Other Ambulatory Visit: Payer: Self-pay | Admitting: Internal Medicine

## 2016-07-24 DIAGNOSIS — I700 Atherosclerosis of aorta: Secondary | ICD-10-CM | POA: Insufficient documentation

## 2016-07-24 DIAGNOSIS — E538 Deficiency of other specified B group vitamins: Secondary | ICD-10-CM | POA: Insufficient documentation

## 2016-07-24 DIAGNOSIS — R599 Enlarged lymph nodes, unspecified: Secondary | ICD-10-CM | POA: Diagnosis not present

## 2016-07-24 DIAGNOSIS — M47892 Other spondylosis, cervical region: Secondary | ICD-10-CM | POA: Diagnosis not present

## 2016-07-24 DIAGNOSIS — R591 Generalized enlarged lymph nodes: Secondary | ICD-10-CM | POA: Insufficient documentation

## 2016-07-24 DIAGNOSIS — L299 Pruritus, unspecified: Secondary | ICD-10-CM | POA: Diagnosis not present

## 2016-07-24 DIAGNOSIS — R634 Abnormal weight loss: Secondary | ICD-10-CM | POA: Diagnosis not present

## 2016-07-24 DIAGNOSIS — R590 Localized enlarged lymph nodes: Secondary | ICD-10-CM | POA: Diagnosis not present

## 2016-07-24 DIAGNOSIS — C9110 Chronic lymphocytic leukemia of B-cell type not having achieved remission: Secondary | ICD-10-CM

## 2016-07-24 DIAGNOSIS — C9112 Chronic lymphocytic leukemia of B-cell type in relapse: Secondary | ICD-10-CM | POA: Diagnosis not present

## 2016-07-24 DIAGNOSIS — R161 Splenomegaly, not elsewhere classified: Secondary | ICD-10-CM | POA: Insufficient documentation

## 2016-07-24 DIAGNOSIS — F17210 Nicotine dependence, cigarettes, uncomplicated: Secondary | ICD-10-CM | POA: Diagnosis not present

## 2016-07-24 DIAGNOSIS — Z5111 Encounter for antineoplastic chemotherapy: Secondary | ICD-10-CM | POA: Diagnosis not present

## 2016-07-24 DIAGNOSIS — R5383 Other fatigue: Secondary | ICD-10-CM | POA: Insufficient documentation

## 2016-07-24 LAB — COMPREHENSIVE METABOLIC PANEL
ALBUMIN: 5.1 g/dL — AB (ref 3.5–5.0)
ALT: 17 U/L (ref 14–54)
AST: 24 U/L (ref 15–41)
Alkaline Phosphatase: 66 U/L (ref 38–126)
Anion gap: 8 (ref 5–15)
BUN: 20 mg/dL (ref 6–20)
CHLORIDE: 105 mmol/L (ref 101–111)
CO2: 25 mmol/L (ref 22–32)
CREATININE: 0.61 mg/dL (ref 0.44–1.00)
Calcium: 9.1 mg/dL (ref 8.9–10.3)
GFR calc Af Amer: >60 mL/min (ref >60)
GFR calc non Af Amer: >60 mL/min (ref >60)
GLUCOSE: 103 mg/dL — AB (ref 65–99)
Potassium: 4.5 mmol/L (ref 3.5–5.1)
SODIUM: 138 mmol/L (ref 135–145)
Total Bilirubin: 0.8 mg/dL (ref 0.3–1.2)
Total Protein: 7 g/dL (ref 6.5–8.1)

## 2016-07-24 LAB — CBC WITH DIFFERENTIAL/PLATELET
BASOS ABS: 0 10*3/uL (ref 0–0.1)
Basophils Relative: 0 %
EOS ABS: 0.3 10*3/uL (ref 0–0.7)
EOS PCT: 0 %
HCT: 36.1 % (ref 35.0–47.0)
HEMOGLOBIN: 11.2 g/dL — AB (ref 12.0–16.0)
Lymphocytes Relative: 96 %
Lymphs Abs: 172.7 10*3/uL — ABNORMAL HIGH (ref 1.0–3.6)
MCH: 33.6 pg (ref 26.0–34.0)
MCHC: 31.1 g/dL — ABNORMAL LOW (ref 32.0–36.0)
MCV: 108.1 fL — ABNORMAL HIGH (ref 80.0–100.0)
Monocytes Absolute: 1.7 10*3/uL — ABNORMAL HIGH (ref 0.2–0.9)
Monocytes Relative: 1 %
NEUTROS PCT: 3 %
Neutro Abs: 4.8 10*3/uL (ref 1.4–6.5)
PLATELETS: 96 10*3/uL — AB (ref 150–440)
RBC: 3.34 MIL/uL — AB (ref 3.80–5.20)
RDW: 16.7 % — ABNORMAL HIGH (ref 11.5–14.5)
WBC: 179.4 10*3/uL — AB (ref 3.6–11.0)

## 2016-07-24 LAB — LACTATE DEHYDROGENASE: LDH: 182 U/L (ref 98–192)

## 2016-07-24 MED ORDER — IOPAMIDOL (ISOVUE-300) INJECTION 61%
100.0000 mL | Freq: Once | INTRAVENOUS | Status: AC | PRN
  Administered 2016-07-24: 100 mL via INTRAVENOUS

## 2016-07-24 MED ORDER — ACYCLOVIR 200 MG PO CAPS: 200.0000 mg | ORAL_CAPSULE | Freq: Three times a day (TID) | ORAL | 0 refills | Status: DC

## 2016-07-24 MED ORDER — ALLOPURINOL 300 MG PO TABS: 300.0000 mg | ORAL_TABLET | Freq: Every day | ORAL | 3 refills | Status: DC

## 2016-07-24 NOTE — Telephone Encounter (Signed)
Rx sent to [facility] as requested

## 2016-07-24 NOTE — Telephone Encounter (Signed)
Pt needs 3 month refill of Acyclovir 200 mg. Please call in to pharmacy. Thanks.

## 2016-07-25 ENCOUNTER — Inpatient Hospital Stay: Payer: PPO | Attending: Internal Medicine | Admitting: Internal Medicine

## 2016-07-25 ENCOUNTER — Inpatient Hospital Stay: Payer: PPO

## 2016-07-25 ENCOUNTER — Ambulatory Visit: Payer: PPO

## 2016-07-25 VITALS — BP 134/74 | HR 79 | Temp 98.0°F | Wt 130.0 lb

## 2016-07-25 DIAGNOSIS — C9110 Chronic lymphocytic leukemia of B-cell type not having achieved remission: Secondary | ICD-10-CM

## 2016-07-25 DIAGNOSIS — E538 Deficiency of other specified B group vitamins: Secondary | ICD-10-CM

## 2016-07-25 DIAGNOSIS — R634 Abnormal weight loss: Secondary | ICD-10-CM | POA: Diagnosis not present

## 2016-07-25 DIAGNOSIS — R5383 Other fatigue: Secondary | ICD-10-CM

## 2016-07-25 DIAGNOSIS — F17210 Nicotine dependence, cigarettes, uncomplicated: Secondary | ICD-10-CM | POA: Diagnosis not present

## 2016-07-25 DIAGNOSIS — C9112 Chronic lymphocytic leukemia of B-cell type in relapse: Secondary | ICD-10-CM | POA: Diagnosis not present

## 2016-07-25 DIAGNOSIS — L299 Pruritus, unspecified: Secondary | ICD-10-CM

## 2016-07-25 DIAGNOSIS — Z5111 Encounter for antineoplastic chemotherapy: Secondary | ICD-10-CM | POA: Diagnosis not present

## 2016-07-25 LAB — HEPATITIS B CORE ANTIBODY, IGM: Hep B C IgM: NEGATIVE

## 2016-07-25 LAB — HEPATITIS B SURFACE ANTIGEN: Hepatitis B Surface Ag: NEGATIVE

## 2016-07-25 NOTE — Assessment & Plan Note (Addendum)
#   CLL/  recurrent stage IV- today white count is 1 75,000 hemoglobin 11-23. However patient is getting more symptomatic [fatigue; weight loss; progressive lymphadenopathy].CT N-C-A-P- worsening LN; massive spleen. Awaiting on fish panel.  # I would recommend initiation of treatment with Gazyva; ; on a monthly basis 6. Discussed potential infusion reactions; risk of infections. Patient is interested. The goal of treatment is to control the disease.  # Start also allopurinol today- TLS prophylaxis.   # Follow up in 2 weeks/ with treatment /labs.   # Infectious prophylaxis with acyclovir/Bactrim.  # I reviewed the blood work- with the patient in detail; also reviewed the imaging independently [as summarized above]; and with the patient in detail.

## 2016-07-25 NOTE — Progress Notes (Signed)
[facility] OFFICE PROGRESS NOTE  Patient Care Team: Juline Patch, MD as PCP - General (Family Medicine)  No matching staging information was found for the patient.   Oncology History   # 2002- CLL [Dx- in [place] 2010- FCR x 4 cycles [Dr.Pandit]; Normal karyotype; PR; AUG 2012- Single agent Rituxan x7 [held sec to neutropenia];   # June 2015- Progression based on lymphocytosis- surveillance; Feb 2017- FISH- 13 q del/ IGVH MUTATED [good prognostic]  # Hx of skin rash [2012 s/p Bx-ant thigh lesion-vesicular spongiotic dermatitis]-resolved; B12 def [2012]     CLL (chronic lymphocytic leukemia) ([place])   02/28/2016 Initial Diagnosis    CLL (chronic lymphocytic leukemia) ([place])       INTERVAL HISTORY:  Maureen [name] 76 y.o.  female pleasant patient above history of CLL relapsed currently on surveillance is here for follow-up/ His energy review the results of her CT scan.  Patient continues to be tired. Losing weight. She noted to have lumps in the neck in the groin. No pain.   Otherwise denies any fevers or chills. Appetite poor.  REVIEW OF SYSTEMS:  A complete 10 point review of system is done which is negative except mentioned above/history of present illness.   PAST MEDICAL HISTORY :  Past Medical History:  Diagnosis Date  . Anemia   . CLL (chronic lymphocytic leukemia) ([place])   . Itching   . Vitamin B 12 deficiency     PAST SURGICAL HISTORY :   Past Surgical History:  Procedure Laterality Date  . breeast mass removed  1988   benign    FAMILY HISTORY :  No family history on file.  SOCIAL HISTORY:   Social History  Substance Use Topics  . Smoking status: Current Every Day Smoker    Packs/day: 0.50    Types: Cigarettes  . Smokeless tobacco: Never Used  . Alcohol use Yes     Comment: occ.    ALLERGIES:  has No Known Allergies.  MEDICATIONS:  Current Outpatient Prescriptions  Medication Sig Dispense Refill  . acyclovir (ZOVIRAX) 200 MG  capsule Take 1 capsule (200 mg total) by mouth 3 (three) times daily. 270 capsule 0  . betamethasone dipropionate (DIPROLENE) 0.05 % cream Apply topically 2 (two) times daily. 30 g 11  . doxycycline (VIBRA-TABS) 100 MG tablet Take 1 tablet (100 mg total) by mouth 2 (two) times daily. (Patient taking differently: Take 100 mg by mouth 2 (two) times daily. As needed from cancer center) 14 tablet 0  . Emollient (EUCERIN) lotion Apply topically as needed for dry skin. 240 mL 0  . ferrous sulfate 325 (65 FE) MG tablet Take 325 mg by mouth daily with breakfast.    . folic acid (FOLVITE) 338 MCG tablet Take 400 mcg by mouth daily.    . hydrOXYzine (ATARAX/VISTARIL) 25 MG tablet Take 1 tablet (25 mg total) by mouth 3 (three) times daily as needed. 60 tablet 1  . loratadine (CLARITIN) 10 MG tablet Take 1 tablet (10 mg total) by mouth daily. 30 tablet 11  . sulfamethoxazole-trimethoprim (BACTRIM DS,SEPTRA DS) 800-160 MG tablet TAKE ONE TABLET BY MOUTH ON MONDAY,WEDNESDAY AND FRIDAY 36 tablet 3  . allopurinol (ZYLOPRIM) 300 MG tablet Take 1 tablet (300 mg total) by mouth daily. (Patient not taking: Reported on 07/25/2016) 90 tablet 3   No current facility-administered medications for this visit.     PHYSICAL EXAMINATION: ECOG PERFORMANCE STATUS: 0 - Asymptomatic  BP 134/74 (BP Location: Left Arm, Patient  Position: Sitting)   Pulse 79   Temp 98 F (36.7 C) (Tympanic)   Wt 130 lb (59 kg)   BMI 20.36 kg/m   Filed Weights   07/25/16 0838  Weight: 130 lb (59 kg)    GENERAL: Well-nourished well-developed; Alert, no distress and comfortable.   Alone.  EYES: no pallor or icterus OROPHARYNX: no thrush or ulceration; good dentition  NECK: supple, no masses felt LYMPH:  1-2 cm bilateral lymphadenopathy noted in  cervical, axillary or inguinal regions LUNGS: clear to auscultation and  No wheeze or crackles HEART/CVS: regular rate & rhythm and no murmurs; No lower extremity edema ABDOMEN:abdomen soft,  non-tender and normal bowel sounds Musculoskeletal:no cyanosis of digits and no clubbing  PSYCH: alert & oriented x 3 with fluent speech NEURO: no focal motor/sensory deficits SKIN:  no rashes or significant lesions; partial alopecia.   LABORATORY DATA:  I have reviewed the data as listed    Component Value Date/Time   NA 138 07/24/2016 0826   NA 144 03/26/2013 0823   K 4.5 07/24/2016 0826   K 4.0 03/26/2013 0823   CL 105 07/24/2016 0826   CL 106 03/26/2013 0823   CO2 25 07/24/2016 0826   CO2 28 03/26/2013 0823   GLUCOSE 103 (H) 07/24/2016 0826   GLUCOSE 96 03/26/2013 0823   BUN 20 07/24/2016 0826   BUN 12 03/26/2013 0823   CREATININE 0.61 07/24/2016 0826   CREATININE 0.64 12/17/2013 0947   CALCIUM 9.1 07/24/2016 0826   CALCIUM 8.6 03/26/2013 0823   PROT 7.0 07/24/2016 0826   PROT 6.6 12/17/2013 0947   ALBUMIN 5.1 (H) 07/24/2016 0826   ALBUMIN 4.0 12/17/2013 0947   AST 24 07/24/2016 0826   AST 19 12/17/2013 0947   ALT 17 07/24/2016 0826   ALT 26 12/17/2013 0947   ALKPHOS 66 07/24/2016 0826   ALKPHOS 63 12/17/2013 0947   BILITOT 0.8 07/24/2016 0826   BILITOT 0.4 12/17/2013 0947   GFRNONAA >60 07/24/2016 0826   GFRNONAA >60 09/24/2013 0854   GFRAA >60 07/24/2016 0826   GFRAA >60 09/24/2013 0854    No results found for: SPEP, UPEP  Lab Results  Component Value Date   WBC 179.4 (HH) 07/24/2016   NEUTROABS 4.8 07/24/2016   HGB 11.2 (L) 07/24/2016   HCT 36.1 07/24/2016   MCV 108.1 (H) 07/24/2016   PLT 96 (L) 07/24/2016      Chemistry      Component Value Date/Time   NA 138 07/24/2016 0826   NA 144 03/26/2013 0823   K 4.5 07/24/2016 0826   K 4.0 03/26/2013 0823   CL 105 07/24/2016 0826   CL 106 03/26/2013 0823   CO2 25 07/24/2016 0826   CO2 28 03/26/2013 0823   BUN 20 07/24/2016 0826   BUN 12 03/26/2013 0823   CREATININE 0.61 07/24/2016 0826   CREATININE 0.64 12/17/2013 0947      Component Value Date/Time   CALCIUM 9.1 07/24/2016 0826   CALCIUM 8.6  03/26/2013 0823   ALKPHOS 66 07/24/2016 0826   ALKPHOS 63 12/17/2013 0947   AST 24 07/24/2016 0826   AST 19 12/17/2013 0947   ALT 17 07/24/2016 0826   ALT 26 12/17/2013 0947   BILITOT 0.8 07/24/2016 0826   BILITOT 0.4 12/17/2013 0947       RADIOGRAPHIC STUDIES: I have personally reviewed the radiological images as listed and agreed with the findings in the report. No results found.   ASSESSMENT & PLAN:  CLL (  chronic lymphocytic leukemia) ([place]) # CLL/  recurrent stage IV- today white count is 1 75,000 hemoglobin 11-23. However patient is getting more symptomatic [fatigue; weight loss; progressive lymphadenopathy].CT N-C-A-P- worsening LN; massive spleen. Awaiting on fish panel.  # I would recommend initiation of treatment with Gazyva; ; on a monthly basis 6. Discussed potential infusion reactions; risk of infections. Patient is interested. The goal of treatment is to control the disease.  # Start also allopurinol today- TLS prophylaxis.   # Follow up in 2 weeks/ with treatment /labs.   # Infectious prophylaxis with acyclovir/Bactrim.  # I reviewed the blood work- with the patient in detail; also reviewed the imaging independently [as summarized above]; and with the patient in detail.     Orders Placed This Encounter  Procedures  . CBC with Differential    Standing Status:   Future    Standing Expiration Date:   07/25/2017  . Basic metabolic panel    Standing Status:   Future    Standing Expiration Date:   07/25/2017   All questions were answered. The patient knows to call the clinic with any problems, questions or concerns.      Cammie Sickle, MD 07/26/2016 4:41 PM

## 2016-07-25 NOTE — Progress Notes (Signed)
Patient here today for follow up.  Patient has no new concerns today  

## 2016-07-26 ENCOUNTER — Inpatient Hospital Stay: Payer: PPO

## 2016-07-26 VITALS — BP 125/70 | HR 70 | Temp 97.9°F | Resp 20

## 2016-07-26 DIAGNOSIS — Z5111 Encounter for antineoplastic chemotherapy: Secondary | ICD-10-CM | POA: Diagnosis not present

## 2016-07-26 DIAGNOSIS — C9110 Chronic lymphocytic leukemia of B-cell type not having achieved remission: Secondary | ICD-10-CM

## 2016-07-26 MED ORDER — DIPHENHYDRAMINE HCL 50 MG/ML IJ SOLN
25.0000 mg | Freq: Once | INTRAMUSCULAR | Status: AC | PRN
  Administered 2016-07-26: 25 mg via INTRAVENOUS

## 2016-07-26 MED ORDER — SODIUM CHLORIDE 0.9 % IV SOLN
20.0000 mg | Freq: Once | INTRAVENOUS | Status: AC
  Administered 2016-07-26: 20 mg via INTRAVENOUS
  Filled 2016-07-26: qty 2

## 2016-07-26 MED ORDER — HYDROCORTISONE NA SUCCINATE PF 100 MG IJ SOLR
100.0000 mg | Freq: Once | INTRAMUSCULAR | Status: AC
  Administered 2016-07-26: 100 mg via INTRAVENOUS

## 2016-07-26 MED ORDER — ACETAMINOPHEN 325 MG PO TABS
650.0000 mg | ORAL_TABLET | Freq: Once | ORAL | Status: AC
  Administered 2016-07-26: 650 mg via ORAL
  Filled 2016-07-26: qty 2

## 2016-07-26 MED ORDER — ALLOPURINOL 300 MG PO TABS
300.0000 mg | ORAL_TABLET | Freq: Once | ORAL | Status: AC
  Administered 2016-07-26: 300 mg via ORAL
  Filled 2016-07-26: qty 1

## 2016-07-26 MED ORDER — DIPHENHYDRAMINE HCL 50 MG/ML IJ SOLN
50.0000 mg | Freq: Once | INTRAMUSCULAR | Status: AC
  Administered 2016-07-26: 50 mg via INTRAVENOUS
  Filled 2016-07-26: qty 1

## 2016-07-26 MED ORDER — SODIUM CHLORIDE 0.9 % IV SOLN
Freq: Once | INTRAVENOUS | Status: AC
  Administered 2016-07-26: 09:00:00 via INTRAVENOUS
  Filled 2016-07-26: qty 1000

## 2016-07-26 MED ORDER — SODIUM CHLORIDE 0.9 % IV SOLN
100.0000 mg | Freq: Once | INTRAVENOUS | Status: AC
  Administered 2016-07-26: 100 mg via INTRAVENOUS
  Filled 2016-07-26: qty 4

## 2016-07-26 MED ORDER — FAMOTIDINE IN NACL 20-0.9 MG/50ML-% IV SOLN
20.0000 mg | Freq: Once | INTRAVENOUS | Status: AC | PRN
  Administered 2016-07-26: 20 mg via INTRAVENOUS
  Filled 2016-07-26: qty 50

## 2016-07-26 NOTE — Progress Notes (Signed)
Gazyva started at 1025. 20 minutes after starting infusion pt started coughing, flushed in face. Shortness of breath noted. Infusion stopped at 1045.  '25mg'$   IV Benadryl and '100mg'$  solucortef given. Dr Rogue Bussing at chairside. Oxygen 86% on room air. BP elevated. 2L via [place] of oxygen started. Increased to 3L after minimal improvement in oxygen saturation. 15 minutes later pt noted to be feeling better, BP lower, sats 94%. Will continue to monitor  1152-Pt back at baseline. Spoke with Dr Rogue Bussing. Orders to restart Gazyva at '15mg'$ /hr for 1 hour. If tolerated well may increase to '25mg'$ /hr for remainder of treatment. Restarted at 1150. Will continue to monitor  1300-Pt tolerated well at '15mg'$ /hr. Increased to '25mg'$ /hr per MD orders. WIll continue to monitor.  1610-Pt finished remainder of Gazyva without any difficulties. VS stable. Pt to return tomorrow for Day 2.

## 2016-07-27 ENCOUNTER — Inpatient Hospital Stay: Payer: PPO

## 2016-07-27 VITALS — BP 108/68 | HR 77 | Temp 98.2°F | Resp 20

## 2016-07-27 DIAGNOSIS — Z5111 Encounter for antineoplastic chemotherapy: Secondary | ICD-10-CM | POA: Diagnosis not present

## 2016-07-27 DIAGNOSIS — C9110 Chronic lymphocytic leukemia of B-cell type not having achieved remission: Secondary | ICD-10-CM

## 2016-07-27 MED ORDER — ACETAMINOPHEN 325 MG PO TABS
650.0000 mg | ORAL_TABLET | Freq: Once | ORAL | Status: AC
  Administered 2016-07-27: 650 mg via ORAL
  Filled 2016-07-27: qty 2

## 2016-07-27 MED ORDER — FAMOTIDINE IN NACL 20-0.9 MG/50ML-% IV SOLN
20.0000 mg | Freq: Once | INTRAVENOUS | Status: AC
  Administered 2016-07-27: 20 mg via INTRAVENOUS
  Filled 2016-07-27: qty 50

## 2016-07-27 MED ORDER — SODIUM CHLORIDE 0.9 % IV SOLN
20.0000 mg | Freq: Once | INTRAVENOUS | Status: AC
  Administered 2016-07-27: 20 mg via INTRAVENOUS
  Filled 2016-07-27: qty 2

## 2016-07-27 MED ORDER — SODIUM CHLORIDE 0.9 % IV SOLN
900.0000 mg | Freq: Once | INTRAVENOUS | Status: AC
  Administered 2016-07-27: 900 mg via INTRAVENOUS
  Filled 2016-07-27: qty 36

## 2016-07-27 MED ORDER — DIPHENHYDRAMINE HCL 50 MG/ML IJ SOLN
50.0000 mg | Freq: Once | INTRAMUSCULAR | Status: AC
  Administered 2016-07-27: 50 mg via INTRAVENOUS
  Filled 2016-07-27: qty 1

## 2016-07-27 MED ORDER — SODIUM CHLORIDE 0.9 % IV SOLN
Freq: Once | INTRAVENOUS | Status: AC
  Administered 2016-07-27: 09:00:00 via INTRAVENOUS
  Filled 2016-07-27: qty 1000

## 2016-07-30 LAB — FISH HES LEUKEMIA, 4Q12 REA
Cells Analyzed: 100
Cells Counted:: 100

## 2016-08-01 ENCOUNTER — Inpatient Hospital Stay: Payer: PPO

## 2016-08-02 ENCOUNTER — Inpatient Hospital Stay: Payer: PPO

## 2016-08-02 ENCOUNTER — Ambulatory Visit: Payer: PPO

## 2016-08-03 ENCOUNTER — Inpatient Hospital Stay: Payer: PPO

## 2016-08-08 ENCOUNTER — Inpatient Hospital Stay: Payer: PPO

## 2016-08-08 ENCOUNTER — Inpatient Hospital Stay (HOSPITAL_BASED_OUTPATIENT_CLINIC_OR_DEPARTMENT_OTHER): Payer: PPO | Admitting: Internal Medicine

## 2016-08-08 VITALS — BP 104/65 | HR 89 | Temp 98.1°F | Wt 127.2 lb

## 2016-08-08 DIAGNOSIS — E538 Deficiency of other specified B group vitamins: Secondary | ICD-10-CM

## 2016-08-08 DIAGNOSIS — F17210 Nicotine dependence, cigarettes, uncomplicated: Secondary | ICD-10-CM | POA: Diagnosis not present

## 2016-08-08 DIAGNOSIS — R5383 Other fatigue: Secondary | ICD-10-CM | POA: Diagnosis not present

## 2016-08-08 DIAGNOSIS — C9112 Chronic lymphocytic leukemia of B-cell type in relapse: Secondary | ICD-10-CM

## 2016-08-08 DIAGNOSIS — C9110 Chronic lymphocytic leukemia of B-cell type not having achieved remission: Secondary | ICD-10-CM

## 2016-08-08 DIAGNOSIS — R634 Abnormal weight loss: Secondary | ICD-10-CM | POA: Diagnosis not present

## 2016-08-08 DIAGNOSIS — Z5111 Encounter for antineoplastic chemotherapy: Secondary | ICD-10-CM | POA: Diagnosis not present

## 2016-08-08 DIAGNOSIS — L299 Pruritus, unspecified: Secondary | ICD-10-CM

## 2016-08-08 LAB — BASIC METABOLIC PANEL
Anion gap: 5 (ref 5–15)
BUN: 18 mg/dL (ref 6–20)
CO2: 28 mmol/L (ref 22–32)
CREATININE: 0.45 mg/dL (ref 0.44–1.00)
Calcium: 8.8 mg/dL — ABNORMAL LOW (ref 8.9–10.3)
Chloride: 105 mmol/L (ref 101–111)
GFR calc Af Amer: >60 mL/min (ref >60)
Glucose, Bld: 106 mg/dL — ABNORMAL HIGH (ref 65–99)
POTASSIUM: 4.3 mmol/L (ref 3.5–5.1)
SODIUM: 138 mmol/L (ref 135–145)

## 2016-08-08 LAB — CBC WITH DIFFERENTIAL/PLATELET
BASOS PCT: 0 %
Basophils Absolute: 0 10*3/uL (ref 0–0.1)
EOS ABS: 0 10*3/uL (ref 0–0.7)
EOS PCT: 0 %
HCT: 32.4 % — ABNORMAL LOW (ref 35.0–47.0)
Hemoglobin: 10.8 g/dL — ABNORMAL LOW (ref 12.0–16.0)
LYMPHS ABS: 13.2 10*3/uL — AB (ref 1.0–3.6)
Lymphocytes Relative: 88 %
MCH: 34.5 pg — AB (ref 26.0–34.0)
MCHC: 33.2 g/dL (ref 32.0–36.0)
MCV: 103.9 fL — ABNORMAL HIGH (ref 80.0–100.0)
Monocytes Absolute: 0.2 10*3/uL (ref 0.2–0.9)
Monocytes Relative: 1 %
Neutro Abs: 1.6 10*3/uL (ref 1.4–6.5)
Neutrophils Relative %: 11 %
PLATELETS: 56 10*3/uL — AB (ref 150–440)
RBC: 3.12 MIL/uL — AB (ref 3.80–5.20)
RDW: 15.4 % — ABNORMAL HIGH (ref 11.5–14.5)
WBC: 15 10*3/uL — AB (ref 3.6–11.0)

## 2016-08-08 MED ORDER — ACETAMINOPHEN 325 MG PO TABS
650.0000 mg | ORAL_TABLET | Freq: Once | ORAL | Status: AC
  Administered 2016-08-08: 650 mg via ORAL
  Filled 2016-08-08: qty 2

## 2016-08-08 MED ORDER — OBINUTUZUMAB CHEMO INJECTION 1000 MG/40ML
1000.0000 mg | Freq: Once | INTRAVENOUS | Status: AC
  Administered 2016-08-08: 1000 mg via INTRAVENOUS
  Filled 2016-08-08: qty 40

## 2016-08-08 MED ORDER — SODIUM CHLORIDE 0.9 % IV SOLN
Freq: Once | INTRAVENOUS | Status: AC
  Administered 2016-08-08: 09:00:00 via INTRAVENOUS
  Filled 2016-08-08: qty 1000

## 2016-08-08 MED ORDER — SODIUM CHLORIDE 0.9 % IV SOLN
20.0000 mg | Freq: Once | INTRAVENOUS | Status: AC
  Administered 2016-08-08: 20 mg via INTRAVENOUS
  Filled 2016-08-08: qty 2

## 2016-08-08 MED ORDER — DIPHENHYDRAMINE HCL 50 MG/ML IJ SOLN
50.0000 mg | Freq: Once | INTRAMUSCULAR | Status: AC
  Administered 2016-08-08: 50 mg via INTRAVENOUS
  Filled 2016-08-08: qty 1

## 2016-08-08 MED ORDER — FAMOTIDINE IN NACL 20-0.9 MG/50ML-% IV SOLN
20.0000 mg | Freq: Once | INTRAVENOUS | Status: AC
  Administered 2016-08-08: 20 mg via INTRAVENOUS
  Filled 2016-08-08: qty 50

## 2016-08-08 NOTE — Progress Notes (Signed)
[name] OFFICE PROGRESS NOTE  Patient Care Team: Juline Patch, MD as PCP - General (Family Medicine)  Cancer Staging No matching staging information was found for the patient.   Oncology History   # 2002- CLL [Dx- in [place] 2010- FCR x 4 cycles [Dr.Pandit]; Normal karyotype; PR; AUG 2012- Single agent Rituxan x7 [held sec to neutropenia];   # June 2015- Progression based on lymphocytosis- surveillance; Feb 2017- FISH- 13 q del/ IGVH MUTATED [good prognostic]  # Hx of skin rash [2012 s/p Bx-ant thigh lesion-vesicular spongiotic dermatitis]-resolved; B12 def [2012]     CLL (chronic lymphocytic leukemia) ([place])   02/28/2016 Initial Diagnosis    CLL (chronic lymphocytic leukemia) ([place])       INTERVAL HISTORY:  Maureen [name] 76 y.o.  female pleasant patient above history of CLL relapsed currently on [place] is here for follow-up.  Patient had episode of shortness of breath flushing of the skin during the first cycle of treatment. It was treated with hydrocortisone and Pepcid.  She denies any unusual headaches or vision changes. Denies any new lumps or bumps. No skin rash. Appetite is steady.  REVIEW OF SYSTEMS:  A complete 10 point review of system is done which is negative except mentioned above/history of present illness.   PAST MEDICAL HISTORY :  Past Medical History:  Diagnosis Date  . Anemia   . CLL (chronic lymphocytic leukemia) ([place])   . Itching   . Vitamin B 12 deficiency     PAST SURGICAL HISTORY :   Past Surgical History:  Procedure Laterality Date  . breeast mass removed  1988   benign    FAMILY HISTORY :  No family history on file.  SOCIAL HISTORY:   Social History  Substance Use Topics  . Smoking status: Current Every Day Smoker    Packs/day: 0.50    Types: Cigarettes  . Smokeless tobacco: Never Used  . Alcohol use Yes     Comment: occ.    ALLERGIES:  has No Known Allergies.  MEDICATIONS:  Current Outpatient  Prescriptions  Medication Sig Dispense Refill  . acyclovir (ZOVIRAX) 200 MG capsule Take 1 capsule (200 mg total) by mouth 3 (three) times daily. 270 capsule 0  . allopurinol (ZYLOPRIM) 300 MG tablet Take 1 tablet (300 mg total) by mouth daily. (Patient not taking: Reported on 07/25/2016) 90 tablet 3  . betamethasone dipropionate (DIPROLENE) 0.05 % cream Apply topically 2 (two) times daily. 30 g 11  . doxycycline (VIBRA-TABS) 100 MG tablet Take 1 tablet (100 mg total) by mouth 2 (two) times daily. (Patient taking differently: Take 100 mg by mouth 2 (two) times daily. As needed from cancer center) 14 tablet 0  . Emollient (EUCERIN) lotion Apply topically as needed for dry skin. 240 mL 0  . ferrous sulfate 325 (65 FE) MG tablet Take 325 mg by mouth daily with breakfast.    . folic acid (FOLVITE) 182 MCG tablet Take 400 mcg by mouth daily.    . hydrOXYzine (ATARAX/VISTARIL) 25 MG tablet Take 1 tablet (25 mg total) by mouth 3 (three) times daily as needed. 60 tablet 1  . loratadine (CLARITIN) 10 MG tablet Take 1 tablet (10 mg total) by mouth daily. 30 tablet 11  . sulfamethoxazole-trimethoprim (BACTRIM DS,SEPTRA DS) 800-160 MG tablet TAKE ONE TABLET BY MOUTH ON MONDAY,WEDNESDAY AND FRIDAY 36 tablet 3   No current facility-administered medications for this visit.     PHYSICAL EXAMINATION: ECOG PERFORMANCE STATUS: 0 - Asymptomatic  BP 104/65 (BP Location: Left Arm, Patient Position: Sitting)   Pulse 89   Temp 98.1 F (36.7 C) (Tympanic)   Wt 127 lb 4 oz (57.7 kg)   BMI 19.93 kg/m   Filed Weights   08/08/16 0820  Weight: 127 lb 4 oz (57.7 kg)    GENERAL: Well-nourished well-developed; Alert, no distress and comfortable.   Alone.  EYES: no pallor or icterus OROPHARYNX: no thrush or ulceration; good dentition  NECK: supple, no masses felt LYMPH:  1-2 cm bilateral lymphadenopathy noted in  cervical, axillary or inguinal regions LUNGS: clear to auscultation and  No wheeze or  crackles HEART/CVS: regular rate & rhythm and no murmurs; No lower extremity edema ABDOMEN:abdomen soft, non-tender and normal bowel sounds Musculoskeletal:no cyanosis of digits and no clubbing  PSYCH: alert & oriented x 3 with fluent speech NEURO: no focal motor/sensory deficits SKIN:  no rashes or significant lesions; partial alopecia.   LABORATORY DATA:  I have reviewed the data as listed    Component Value Date/Time   NA 138 08/08/2016 0800   NA 144 03/26/2013 0823   K 4.3 08/08/2016 0800   K 4.0 03/26/2013 0823   CL 105 08/08/2016 0800   CL 106 03/26/2013 0823   CO2 28 08/08/2016 0800   CO2 28 03/26/2013 0823   GLUCOSE 106 (H) 08/08/2016 0800   GLUCOSE 96 03/26/2013 0823   BUN 18 08/08/2016 0800   BUN 12 03/26/2013 0823   CREATININE 0.45 08/08/2016 0800   CREATININE 0.64 12/17/2013 0947   CALCIUM 8.8 (L) 08/08/2016 0800   CALCIUM 8.6 03/26/2013 0823   PROT 7.0 07/24/2016 0826   PROT 6.6 12/17/2013 0947   ALBUMIN 5.1 (H) 07/24/2016 0826   ALBUMIN 4.0 12/17/2013 0947   AST 24 07/24/2016 0826   AST 19 12/17/2013 0947   ALT 17 07/24/2016 0826   ALT 26 12/17/2013 0947   ALKPHOS 66 07/24/2016 0826   ALKPHOS 63 12/17/2013 0947   BILITOT 0.8 07/24/2016 0826   BILITOT 0.4 12/17/2013 0947   GFRNONAA >60 08/08/2016 0800   GFRNONAA >60 09/24/2013 0854   GFRAA >60 08/08/2016 0800   GFRAA >60 09/24/2013 0854    No results found for: SPEP, UPEP  Lab Results  Component Value Date   WBC 15.0 (H) 08/08/2016   NEUTROABS 1.6 08/08/2016   HGB 10.8 (L) 08/08/2016   HCT 32.4 (L) 08/08/2016   MCV 103.9 (H) 08/08/2016   PLT 56 (L) 08/08/2016      Chemistry      Component Value Date/Time   NA 138 08/08/2016 0800   NA 144 03/26/2013 0823   K 4.3 08/08/2016 0800   K 4.0 03/26/2013 0823   CL 105 08/08/2016 0800   CL 106 03/26/2013 0823   CO2 28 08/08/2016 0800   CO2 28 03/26/2013 0823   BUN 18 08/08/2016 0800   BUN 12 03/26/2013 0823   CREATININE 0.45 08/08/2016 0800    CREATININE 0.64 12/17/2013 0947      Component Value Date/Time   CALCIUM 8.8 (L) 08/08/2016 0800   CALCIUM 8.6 03/26/2013 0823   ALKPHOS 66 07/24/2016 0826   ALKPHOS 63 12/17/2013 0947   AST 24 07/24/2016 0826   AST 19 12/17/2013 0947   ALT 17 07/24/2016 0826   ALT 26 12/17/2013 0947   BILITOT 0.8 07/24/2016 0826   BILITOT 0.4 12/17/2013 0947       RADIOGRAPHIC STUDIES: I have personally reviewed the radiological images as listed and agreed with the  findings in the report. No results found.   ASSESSMENT & PLAN:  CLL (chronic lymphocytic leukemia) ([place]) # CLL/  recurrent stage IV- today white count is 1 75,000 hemoglobin 11-23. However patient is getting more symptomatic [fatigue; weight loss; progressive lymphadenopathy].CT N-C-A-P- worsening LN; massive spleen. JAN 2018- FISH Positive for 13 Q del.  # Proceed with cycle # 1 day 8 today . Labs- WBC 15; hb 10-; platelets- 53. BMP- okay.   # also allopurinol today- TLS prophylaxis.   # Infectious prophylaxis with acyclovir/Bactrim.  # # Follow up in 2/14 MD/chemo/labs; 1 week/ with treatment /labs.     Orders Placed This Encounter  Procedures  . CBC with Differential/Platelet    Standing Status:   Future    Standing Expiration Date:   02/05/2017  . Basic metabolic panel    Standing Status:   Future    Standing Expiration Date:   02/05/2017  . CBC with Differential/Platelet    Standing Status:   Future    Standing Expiration Date:   02/05/2017  . Comprehensive metabolic panel    Standing Status:   Future    Standing Expiration Date:   02/05/2017   All questions were answered. The patient knows to call the clinic with any problems, questions or concerns.      Cammie Sickle, MD 08/08/2016 5:44 PM

## 2016-08-08 NOTE — Progress Notes (Signed)
Plt =56 Per MD, ok to proceed with treatment today

## 2016-08-08 NOTE — Progress Notes (Signed)
Patient here today for follow up.   

## 2016-08-08 NOTE — Assessment & Plan Note (Addendum)
#   CLL/  recurrent stage IV- today white count is 1 75,000 hemoglobin 11-23. However patient is getting more symptomatic [fatigue; weight loss; progressive lymphadenopathy].CT N-C-A-P- worsening LN; massive spleen. JAN 2018- FISH Positive for 13 Q del.  # Proceed with cycle # 1 day 8 today . Labs- WBC 15; hb 10-; platelets- 53. BMP- okay.   # also allopurinol today- TLS prophylaxis.   # Infectious prophylaxis with acyclovir/Bactrim.  # # Follow up in 2/14 MD/chemo/labs; 1 week/ with treatment /labs.

## 2016-08-09 ENCOUNTER — Ambulatory Visit: Payer: PPO

## 2016-08-13 ENCOUNTER — Ambulatory Visit
Admission: EM | Admit: 2016-08-13 | Discharge: 2016-08-13 | Disposition: A | Discharge status: Home / Self Care | Payer: PPO | Attending: Family Medicine | Admitting: Family Medicine

## 2016-08-13 ENCOUNTER — Encounter: Payer: Self-pay | Admitting: Emergency Medicine

## 2016-08-13 ENCOUNTER — Ambulatory Visit (INDEPENDENT_AMBULATORY_CARE_PROVIDER_SITE_OTHER): Payer: PPO

## 2016-08-13 ENCOUNTER — Emergency Department
Admission: EM | Admit: 2016-08-13 | Discharge: 2016-08-13 | Disposition: A | Discharge status: Home / Self Care | Payer: PPO | Attending: Emergency Medicine | Admitting: Emergency Medicine

## 2016-08-13 DIAGNOSIS — Y939 Activity, unspecified: Secondary | ICD-10-CM | POA: Diagnosis not present

## 2016-08-13 DIAGNOSIS — W19XXXA Unspecified fall, initial encounter: Secondary | ICD-10-CM

## 2016-08-13 DIAGNOSIS — Y999 Unspecified external cause status: Secondary | ICD-10-CM | POA: Diagnosis not present

## 2016-08-13 DIAGNOSIS — S82002A Unspecified fracture of left patella, initial encounter for closed fracture: Secondary | ICD-10-CM | POA: Diagnosis not present

## 2016-08-13 DIAGNOSIS — W1789XA Other fall from one level to another, initial encounter: Secondary | ICD-10-CM | POA: Diagnosis not present

## 2016-08-13 DIAGNOSIS — S8992XA Unspecified injury of left lower leg, initial encounter: Secondary | ICD-10-CM | POA: Diagnosis not present

## 2016-08-13 DIAGNOSIS — Y929 Unspecified place or not applicable: Secondary | ICD-10-CM | POA: Insufficient documentation

## 2016-08-13 DIAGNOSIS — F17210 Nicotine dependence, cigarettes, uncomplicated: Secondary | ICD-10-CM | POA: Diagnosis not present

## 2016-08-13 DIAGNOSIS — S82042A Displaced comminuted fracture of left patella, initial encounter for closed fracture: Secondary | ICD-10-CM | POA: Diagnosis not present

## 2016-08-13 DIAGNOSIS — S82092A Other fracture of left patella, initial encounter for closed fracture: Secondary | ICD-10-CM

## 2016-08-13 DIAGNOSIS — Z79899 Other long term (current) drug therapy: Secondary | ICD-10-CM | POA: Insufficient documentation

## 2016-08-13 MED ORDER — OXYCODONE HCL 5 MG PO TABS: 5.0000 mg | ORAL_TABLET | Freq: Four times a day (QID) | ORAL | 0 refills | Status: DC | PRN

## 2016-08-13 NOTE — ED Provider Notes (Signed)
[facility] Emergency Department Provider Note   ____________________________________________   I have reviewed the triage vital signs and the nursing notes.   HISTORY  Chief Complaint Knee Injury   History limited by: Not Limited   HPI Maureen [name] is a 76 y.o. female who presents to the emergency department today because of concern for left patellar fracture. Patient is being sent from [facility] urgent care. Patient was at a store today when she went to step over a trolley, got her right foot caught. Landed on her left knee. Also landed on her hands and face but denies any pain to those areas of her body. X-rays at urgent care shows a comminuted fracture of the left patella.    Past Medical History:  Diagnosis Date  . Anemia   . CLL (chronic lymphocytic leukemia) ([place])   . CLL (chronic lymphocytic leukemia) ([place])   . Itching   . Vitamin B 12 deficiency     Patient Active Problem List   Diagnosis Date Noted  . CLL (chronic lymphocytic leukemia) ([place]) 02/28/2016    Past Surgical History:  Procedure Laterality Date  . breeast mass removed  1988   benign    Prior to Admission medications   Medication Sig Start Date End Date Taking? Authorizing Provider  acyclovir (ZOVIRAX) 200 MG capsule Take 1 capsule (200 mg total) by mouth 3 (three) times daily. 07/24/16   Cammie Sickle, MD  allopurinol (ZYLOPRIM) 300 MG tablet Take 1 tablet (300 mg total) by mouth daily. 07/24/16   Cammie Sickle, MD  betamethasone dipropionate (DIPROLENE) 0.05 % cream Apply topically 2 (two) times daily. 03/20/16   Juline Patch, MD  doxycycline (VIBRA-TABS) 100 MG tablet Take 1 tablet (100 mg total) by mouth 2 (two) times daily. Patient taking differently: Take 100 mg by mouth 2 (two) times daily. As needed from cancer center 04/07/15   Leia Alf, MD  Emollient (EUCERIN) lotion Apply topically as needed for dry skin. 03/20/16   Juline Patch, MD  ferrous sulfate  325 (65 FE) MG tablet Take 325 mg by mouth daily with breakfast.    Historical Provider, MD  folic acid (FOLVITE) 765 MCG tablet Take 400 mcg by mouth daily.    Historical Provider, MD  hydrOXYzine (ATARAX/VISTARIL) 25 MG tablet Take 1 tablet (25 mg total) by mouth 3 (three) times daily as needed. 04/07/15   Leia Alf, MD  loratadine (CLARITIN) 10 MG tablet Take 1 tablet (10 mg total) by mouth daily. 03/20/16   Juline Patch, MD  sulfamethoxazole-trimethoprim (BACTRIM DS,SEPTRA DS) 800-160 MG tablet TAKE ONE TABLET BY MOUTH ON MONDAY,WEDNESDAY AND FRIDAY 03/28/16   Cammie Sickle, MD    Allergies Patient has no known allergies.  No family history on file.  Social History Social History  Substance Use Topics  . Smoking status: Current Every Day Smoker    Packs/day: 0.50    Types: Cigarettes  . Smokeless tobacco: Never Used  . Alcohol use Yes     Comment: occ.    Review of Systems  Constitutional: Negative for fever. Cardiovascular: Negative for chest pain. Respiratory: Negative for shortness of breath. Musculoskeletal: Positive for left knee pain. Neurological: Negative for headaches, focal weakness or numbness.  10-point ROS otherwise negative.  ____________________________________________   PHYSICAL EXAM:  VITAL SIGNS: ED Triage Vitals  Enc Vitals Group     BP 08/13/16 1707 (!) 154/74     Pulse Rate 08/13/16 1707 87  Resp 08/13/16 1707 18     Temp 08/13/16 1707 98.3 F (36.8 C)     Temp Source 08/13/16 1707 Oral     SpO2 08/13/16 1707 99 %     Weight 08/13/16 1708 127 lb (57.6 kg)     Height 08/13/16 1708 '5\' 7"'$  (1.702 m)     Head Circumference --      Peak Flow --      Pain Score 08/13/16 1708 10   Constitutional: Alert and oriented. Well appearing and in no distress. Eyes: Conjunctivae are normal. Normal extraocular movements. ENT   Head: Normocephalic and atraumatic.   Nose: No congestion/rhinnorhea.   Mouth/Throat: Mucous membranes  are moist.   Neck: No stridor. No midline tenderness. Hematological/Lymphatic/Immunilogical: No cervical lymphadenopathy. Cardiovascular: Normal rate, regular rhythm.  No murmurs, rubs, or gallops.  Respiratory: Normal respiratory effort without tachypnea nor retractions. Breath sounds are clear and equal bilaterally. No wheezes/rales/rhonchi. Gastrointestinal: Soft and non tender. No rebound. No guarding.  Genitourinary: Deferred Musculoskeletal: Left knee with swelling, tender to palpation. Patient unable to extend at knee. Sensation intact. DP 2+.  Neurologic:  Normal speech and language. No gross focal neurologic deficits are appreciated.  Skin:  Skin is warm, dry and intact. No rash noted. Psychiatric: Mood and affect are normal. Speech and behavior are normal. Patient exhibits appropriate insight and judgment.  ____________________________________________    LABS (pertinent positives/negatives)  None  ____________________________________________   EKG  None  ____________________________________________    RADIOLOGY  None  ____________________________________________   PROCEDURES  Procedures  ____________________________________________   INITIAL IMPRESSION / ASSESSMENT AND PLAN / ED COURSE  Pertinent labs & imaging results that were available during my care of the patient were reviewed by me and considered in my medical decision making (see chart for details).  Patient from urgent care for left patellar fracture. Dr. Sabra Heck with orthopedics reviewed the imaging. Recommended ace wrap and knee immobilizer. Will follow up in clinic.  ____________________________________________   FINAL CLINICAL IMPRESSION(S) / ED DIAGNOSES  Final diagnoses:  Closed displaced comminuted fracture of left patella, initial encounter     Note: This dictation was prepared with Dragon dictation. Any transcriptional errors that result from this process are unintentional      Nance Pear, MD 08/13/16 [phone]

## 2016-08-13 NOTE — ED Notes (Signed)
Pt sent from [facility] urgent care for eval of left knee fx.  Pt wearing knee immobilizer on left knee.  Pt states she fell onto cement today.  Pt treated for cancer at [facility].

## 2016-08-13 NOTE — ED Triage Notes (Signed)
Pt was shopping at [facility] when she steps over a dolly in the [place] and her right foot got caught and she fell landing on her left knee.

## 2016-08-13 NOTE — Discharge Instructions (Signed)
Please seek medical attention for any high fevers, chest pain, shortness of breath, change in behavior, persistent vomiting, bloody stool or any other new or concerning symptoms.  

## 2016-08-13 NOTE — ED Provider Notes (Signed)
[facility]    CSN: [id number] Arrival date & time: 08/13/16  1248     History   Chief Complaint Chief Complaint  Patient presents with  . Fall    left knee    HPI Maureen [name] is a 76 y.o. female.   Patient's here because of a fall. 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 She states that I was obstructed and when she tried to get the employee was also causing positive destruction to move the digit. She try to go around the employee and the car that she was using to restock the freezer and her foot caught the cart causing her to fall and then on her left knee. She reports the employee called the manager who came and helped her up but as the days is gone on the knee pain has gotten progressively worse and she's had more difficulty standing and walking on the leg   The history is provided by the patient and a friend. No language interpreter was used.  Fall  This is a new problem. The current episode started 3 to 5 hours ago. The problem has been gradually worsening. Pertinent negatives include no chest pain, no abdominal pain, no headaches and no shortness of breath. The symptoms are aggravated by exertion and walking. Nothing relieves the symptoms. She has tried nothing for the symptoms. The treatment provided no relief.    Past Medical History:  Diagnosis Date  . Anemia   . CLL (chronic lymphocytic leukemia) ([place])   . CLL (chronic lymphocytic leukemia) ([place])   . Itching   . Vitamin B 12 deficiency     Patient Active Problem List   Diagnosis Date Noted  . CLL (chronic lymphocytic leukemia) ([place]) 02/28/2016    Past Surgical History:  Procedure Laterality Date  . breeast mass removed  1988   benign    OB History    No data available       Home Medications    Prior to Admission medications   Medication Sig Start Date End Date Taking? Authorizing Provider  acyclovir (ZOVIRAX) 200 MG capsule Take 1 capsule (200 mg  total) by mouth 3 (three) times daily. 07/24/16  Yes Cammie Sickle, MD  allopurinol (ZYLOPRIM) 300 MG tablet Take 1 tablet (300 mg total) by mouth daily. 07/24/16  Yes Cammie Sickle, MD  betamethasone dipropionate (DIPROLENE) 0.05 % cream Apply topically 2 (two) times daily. 03/20/16  Yes Juline Patch, MD  doxycycline (VIBRA-TABS) 100 MG tablet Take 1 tablet (100 mg total) by mouth 2 (two) times daily. Patient taking differently: Take 100 mg by mouth 2 (two) times daily. As needed from cancer center 04/07/15  Yes Leia Alf, MD  Emollient (EUCERIN) lotion Apply topically as needed for dry skin. 03/20/16  Yes Juline Patch, MD  ferrous sulfate 325 (65 FE) MG tablet Take 325 mg by mouth daily with breakfast.   Yes Historical Provider, MD  folic acid (FOLVITE) 875 MCG tablet Take 400 mcg by mouth daily.   Yes Historical Provider, MD  hydrOXYzine (ATARAX/VISTARIL) 25 MG tablet Take 1 tablet (25 mg total) by mouth 3 (three) times daily as needed. 04/07/15  Yes Leia Alf, MD  loratadine (CLARITIN) 10 MG tablet Take 1 tablet (10 mg total) by mouth daily. 03/20/16  Yes Juline Patch, MD  sulfamethoxazole-trimethoprim (BACTRIM DS,SEPTRA DS) 800-160 MG tablet TAKE ONE TABLET BY MOUTH ON MONDAY,WEDNESDAY AND FRIDAY 03/28/16  Yes Cammie Sickle,  MD    Family History History reviewed. No pertinent family history.  Social History Social History  Substance Use Topics  . Smoking status: Current Every Day Smoker    Packs/day: 0.50    Types: Cigarettes  . Smokeless tobacco: Never Used  . Alcohol use Yes     Comment: occ.     Allergies   Patient has no known allergies.   Review of Systems Review of Systems  Respiratory: Negative for shortness of breath.   Cardiovascular: Negative for chest pain.  Gastrointestinal: Negative for abdominal pain.  Neurological: Negative for headaches.     Physical Exam Triage Vital Signs ED Triage Vitals  Enc Vitals Group     BP 08/13/16  1428 (!) 154/67     Pulse Rate 08/13/16 1428 83     Resp 08/13/16 1428 18     Temp 08/13/16 1428 98.8 F (37.1 C)     Temp Source 08/13/16 1428 Oral     SpO2 08/13/16 1428 98 %     Weight 08/13/16 1427 127 lb (57.6 kg)     Height 08/13/16 1427 '5\' 7"'$  (1.702 m)     Head Circumference --      Peak Flow --      Pain Score 08/13/16 1431 8     Pain Loc --      Pain Edu? --      Excl. in [place]? --    No data found.   Updated Vital Signs BP (!) 154/67 (BP Location: Left Arm)   Pulse 83   Temp 98.8 F (37.1 C) (Oral)   Resp 18   Ht '5\' 7"'$  (1.702 m)   Wt 127 lb (57.6 kg)   SpO2 98%   BMI 19.89 kg/m   Visual Acuity Right Eye Distance:   Left Eye Distance:   Bilateral Distance:    Right Eye Near:   Left Eye Near:    Bilateral Near:     Physical Exam  Constitutional: She is oriented to person, place, and time. She appears well-developed and well-nourished.  HENT:  Head: Normocephalic.  Eyes: Pupils are equal, round, and reactive to light.  Neck: Normal range of motion.  Pulmonary/Chest: Effort normal.  Musculoskeletal: She exhibits tenderness and deformity.  Neurological: She is alert and oriented to person, place, and time. No cranial nerve deficit.  Skin: Skin is warm and dry.  Psychiatric: Her mood appears anxious.  Vitals reviewed.    UC Treatments / Results  Labs (all labs ordered are listed, but only abnormal results are displayed) Labs Reviewed - No data to display  EKG  EKG Interpretation None       Radiology Dg Knee Complete 4 Views Left  Result Date: 08/13/2016 CLINICAL DATA:  Fall. EXAM: LEFT KNEE - COMPLETE 4+ VIEW COMPARISON:  No recent prior . FINDINGS: Comminuted fracture of the patella is noted. Displaced fracture fragments are present. No other focal bony abnormality identified. Associated small knee joint effusions present . IMPRESSION: Comminuted displaced fracture of the patella. Associated knee joint effusion. Electronically Signed   By:  Marcello Moores  Register   On: 08/13/2016 15:41    Procedures Procedures (including critical care time)  Medications Ordered in UC Medications - No data to display   Initial Impression / Assessment and Plan / UC Course  I have reviewed the triage vital signs and the nursing notes.  Pertinent labs & imaging results that were available during my care of the patient were reviewed by me and considered  in my medical decision making (see chart for details).    called on-call person to Dr. Benjaman Kindler office about the patient they've instructed just send her to the ED for evaluation. Concerned the patient may require surgery at this time because of the kneecap that concern is going to become unstable as time goes on. No walker is available but we'll place her in a knee immobilizer and have staff sister to her friend's car for transfer.    Final Clinical Impressions(s) / UC Diagnoses   Final diagnoses:  Fall, initial encounter  Patellar sleeve fracture of left knee, closed, initial encounter    New Prescriptions New Prescriptions   No medications on file    Note: This dictation was prepared with Dragon dictation along with smaller phrase technology. Any transcriptional errors that result from this process are unintentional.   Frederich Cha, MD 08/13/16 [phone]

## 2016-08-13 NOTE — ED Triage Notes (Signed)
[place] about noon today and landed on L knee. See earlier at urgent care.

## 2016-08-14 ENCOUNTER — Telehealth: Payer: Self-pay | Admitting: *Deleted

## 2016-08-14 NOTE — Telephone Encounter (Signed)
md made aware.

## 2016-08-14 NOTE — Telephone Encounter (Signed)
-----   Message from Cephus Richer sent at 08/14/2016  8:32 AM EST ----- Contact: [phone] Pt has fallen and broken her left knee and her leg is in a cast. PT can't be on leg until she see the ortho MD . Pt not sure when she will be able to return to cancer center.

## 2016-08-15 ENCOUNTER — Inpatient Hospital Stay: Payer: PPO

## 2016-08-17 ENCOUNTER — Telehealth: Payer: Self-pay | Admitting: *Deleted

## 2016-08-17 NOTE — Telephone Encounter (Signed)
-----   Message from Cephus Richer sent at 08/17/2016  8:22 AM EST ----- Tuesday 6th of Feb... Dr . Nadara Mustard pt will be seeing ortho MD. Not sure if she will be coming on 08-29-16 . Will call back to let us know.

## 2016-08-21 DIAGNOSIS — S82032A Displaced transverse fracture of left patella, initial encounter for closed fracture: Secondary | ICD-10-CM | POA: Diagnosis not present

## 2016-08-29 ENCOUNTER — Inpatient Hospital Stay: Payer: PPO | Attending: Internal Medicine

## 2016-08-29 ENCOUNTER — Inpatient Hospital Stay: Payer: PPO

## 2016-08-29 ENCOUNTER — Inpatient Hospital Stay (HOSPITAL_BASED_OUTPATIENT_CLINIC_OR_DEPARTMENT_OTHER): Payer: PPO | Admitting: Internal Medicine

## 2016-08-29 VITALS — BP 126/75 | HR 85 | Temp 97.9°F | Wt 128.0 lb

## 2016-08-29 DIAGNOSIS — R591 Generalized enlarged lymph nodes: Secondary | ICD-10-CM

## 2016-08-29 DIAGNOSIS — Z8781 Personal history of (healed) traumatic fracture: Secondary | ICD-10-CM | POA: Diagnosis not present

## 2016-08-29 DIAGNOSIS — D649 Anemia, unspecified: Secondary | ICD-10-CM

## 2016-08-29 DIAGNOSIS — Z79899 Other long term (current) drug therapy: Secondary | ICD-10-CM | POA: Insufficient documentation

## 2016-08-29 DIAGNOSIS — C9110 Chronic lymphocytic leukemia of B-cell type not having achieved remission: Secondary | ICD-10-CM | POA: Insufficient documentation

## 2016-08-29 DIAGNOSIS — F17210 Nicotine dependence, cigarettes, uncomplicated: Secondary | ICD-10-CM | POA: Insufficient documentation

## 2016-08-29 DIAGNOSIS — R634 Abnormal weight loss: Secondary | ICD-10-CM

## 2016-08-29 DIAGNOSIS — E538 Deficiency of other specified B group vitamins: Secondary | ICD-10-CM | POA: Diagnosis not present

## 2016-08-29 DIAGNOSIS — Z5111 Encounter for antineoplastic chemotherapy: Secondary | ICD-10-CM | POA: Insufficient documentation

## 2016-08-29 LAB — CBC WITH DIFFERENTIAL/PLATELET
Basophils Absolute: 0 10*3/uL (ref 0–0.1)
Basophils Relative: 0 %
EOS ABS: 0 10*3/uL (ref 0–0.7)
EOS PCT: 0 %
HCT: 31.6 % — ABNORMAL LOW (ref 35.0–47.0)
Hemoglobin: 10.6 g/dL — ABNORMAL LOW (ref 12.0–16.0)
LYMPHS ABS: 6.3 10*3/uL — AB (ref 1.0–3.6)
Lymphocytes Relative: 79 %
MCH: 35.4 pg — AB (ref 26.0–34.0)
MCHC: 33.4 g/dL (ref 32.0–36.0)
MCV: 105.8 fL — ABNORMAL HIGH (ref 80.0–100.0)
MONO ABS: 0.2 10*3/uL (ref 0.2–0.9)
MONOS PCT: 3 %
Neutro Abs: 1.4 10*3/uL (ref 1.4–6.5)
Neutrophils Relative %: 18 %
Platelets: 112 10*3/uL — ABNORMAL LOW (ref 150–440)
RBC: 2.98 MIL/uL — ABNORMAL LOW (ref 3.80–5.20)
RDW: 17 % — AB (ref 11.5–14.5)
WBC: 8 10*3/uL (ref 3.6–11.0)

## 2016-08-29 LAB — COMPREHENSIVE METABOLIC PANEL
ALT: 13 U/L — ABNORMAL LOW (ref 14–54)
ANION GAP: 6 (ref 5–15)
AST: 19 U/L (ref 15–41)
Albumin: 4.3 g/dL (ref 3.5–5.0)
Alkaline Phosphatase: 41 U/L (ref 38–126)
BUN: 17 mg/dL (ref 6–20)
CO2: 26 mmol/L (ref 22–32)
Calcium: 8.9 mg/dL (ref 8.9–10.3)
Chloride: 108 mmol/L (ref 101–111)
Creatinine, Ser: 0.43 mg/dL — ABNORMAL LOW (ref 0.44–1.00)
GFR calc non Af Amer: >60 mL/min (ref >60)
Glucose, Bld: 113 mg/dL — ABNORMAL HIGH (ref 65–99)
Potassium: 4.2 mmol/L (ref 3.5–5.1)
SODIUM: 140 mmol/L (ref 135–145)
Total Bilirubin: 0.8 mg/dL (ref 0.3–1.2)
Total Protein: 6.3 g/dL — ABNORMAL LOW (ref 6.5–8.1)

## 2016-08-29 MED ORDER — SODIUM CHLORIDE 0.9 % IV SOLN
20.0000 mg | Freq: Once | INTRAVENOUS | Status: AC
  Administered 2016-08-29: 20 mg via INTRAVENOUS
  Filled 2016-08-29: qty 2

## 2016-08-29 MED ORDER — DIPHENHYDRAMINE HCL 50 MG/ML IJ SOLN
50.0000 mg | Freq: Once | INTRAMUSCULAR | Status: AC
  Administered 2016-08-29: 50 mg via INTRAVENOUS
  Filled 2016-08-29: qty 1

## 2016-08-29 MED ORDER — ACETAMINOPHEN 325 MG PO TABS
650.0000 mg | ORAL_TABLET | Freq: Once | ORAL | Status: AC
  Administered 2016-08-29: 650 mg via ORAL
  Filled 2016-08-29: qty 2

## 2016-08-29 MED ORDER — OBINUTUZUMAB CHEMO INJECTION 1000 MG/40ML
1000.0000 mg | Freq: Once | INTRAVENOUS | Status: AC
  Administered 2016-08-29: 1000 mg via INTRAVENOUS
  Filled 2016-08-29: qty 40

## 2016-08-29 MED ORDER — SODIUM CHLORIDE 0.9 % IV SOLN
Freq: Once | INTRAVENOUS | Status: AC
  Administered 2016-08-29: 10:00:00 via INTRAVENOUS
  Filled 2016-08-29: qty 1000

## 2016-08-29 NOTE — Progress Notes (Signed)
Patient here today for follow up.  Patient c/o of pain all over due to recent injury to left knee, putting strain on left side of her body.

## 2016-08-29 NOTE — Assessment & Plan Note (Addendum)
#   CLL/  recurrent stage IV- today white count is 1 75,000 hemoglobin 11-23. However patient is getting more symptomatic [fatigue; weight loss; progressive lymphadenopathy].CT N-C-A-P- worsening LN; massive spleen. JAN 2018- FISH Positive for 13 Q del.  # Proceed with cycle # 2 day 1 today .Labs today reviewed;  acceptable for treatment today.   # Left knee trauma/ fracture- conservative management.   # Allopurinol  TLS prophylaxis; STOP as counts improved.   # Infectious prophylaxis with acyclovir/Bactrim.  # # Follow up in 3/14 MD/chemo/labs; 8 weeks.   # discussed with pt/son in detail.

## 2016-08-29 NOTE — Progress Notes (Signed)
[place] OFFICE PROGRESS NOTE  Patient Care Team: Juline Patch, MD as PCP - General (Family Medicine)  Cancer Staging No matching staging information was found for the patient.   Oncology History   # 2002- CLL [Dx- in [place] 2010- FCR x 4 cycles [Dr.Pandit]; Normal karyotype; PR; AUG 2012- Single agent Rituxan x7 [held sec to neutropenia];   # June 2015- Progression based on lymphocytosis- surveillance; Feb 2017- FISH- 13 q del/ IGVH MUTATED [good prognostic]  # Hx of skin rash [2012 s/p Bx-ant thigh lesion-vesicular spongiotic dermatitis]-resolved; B12 def [2012]     CLL (chronic lymphocytic leukemia) ([place])   02/28/2016 Initial Diagnosis    CLL (chronic lymphocytic leukemia) ([place])       INTERVAL HISTORY:  Maureen [name] 76 y.o.  female pleasant patient above history of CLL relapsed currently on [place] is here for follow-up. She is currently on cycle #1; patient missed day 15 infusion because of a fall and fracture of the left knee. Treated conservatively.  She denies any unusual headaches or vision changes. Denies any new lumps or bumps. No skin rash. Appetite is steady. Complains of mild fatigue. Otherwise doing well. She is weightbearing.  REVIEW OF SYSTEMS:  A complete 10 point review of system is done which is negative except mentioned above/history of present illness.   PAST MEDICAL HISTORY :  Past Medical History:  Diagnosis Date  . Anemia   . CLL (chronic lymphocytic leukemia) ([place])   . CLL (chronic lymphocytic leukemia) ([place])   . Itching   . Vitamin B 12 deficiency     PAST SURGICAL HISTORY :   Past Surgical History:  Procedure Laterality Date  . breeast mass removed  1988   benign    FAMILY HISTORY :  No family history on file.  SOCIAL HISTORY:   Social History  Substance Use Topics  . Smoking status: Current Every Day Smoker    Packs/day: 0.50    Types: Cigarettes  . Smokeless tobacco: Never Used  . Alcohol use Yes   Comment: occ.    ALLERGIES:  has No Known Allergies.  MEDICATIONS:  Current Outpatient Prescriptions  Medication Sig Dispense Refill  . acyclovir (ZOVIRAX) 200 MG capsule Take 1 capsule (200 mg total) by mouth 3 (three) times daily. 270 capsule 0  . allopurinol (ZYLOPRIM) 300 MG tablet Take 1 tablet (300 mg total) by mouth daily. 90 tablet 3  . betamethasone dipropionate (DIPROLENE) 0.05 % cream Apply topically 2 (two) times daily. 30 g 11  . Emollient (EUCERIN) lotion Apply topically as needed for dry skin. 240 mL 0  . ferrous sulfate 325 (65 FE) MG tablet Take 325 mg by mouth daily with breakfast.    . folic acid (FOLVITE) 989 MCG tablet Take 400 mcg by mouth daily.    Marland Kitchen loratadine (CLARITIN) 10 MG tablet Take 1 tablet (10 mg total) by mouth daily. 30 tablet 11  . oxyCODONE (ROXICODONE) 5 MG immediate release tablet Take 1 tablet (5 mg total) by mouth every 6 (six) hours as needed for severe pain. 15 tablet 0  . sulfamethoxazole-trimethoprim (BACTRIM DS,SEPTRA DS) 800-160 MG tablet TAKE ONE TABLET BY MOUTH ON MONDAY,WEDNESDAY AND FRIDAY 36 tablet 3   No current facility-administered medications for this visit.     PHYSICAL EXAMINATION: ECOG PERFORMANCE STATUS: 0 - Asymptomatic  BP 126/75 (BP Location: Left Arm, Patient Position: Sitting)   Pulse 85   Temp 97.9 F (36.6 C) (Tympanic)   Wt 128 lb (  58.1 kg)   BMI 20.05 kg/m   Filed Weights   08/29/16 0839  Weight: 128 lb (58.1 kg)    GENERAL: Well-nourished well-developed; Alert, no distress and comfortable.  Accompanied by her son. EYES: no pallor or icterus OROPHARYNX: no thrush or ulceration; good dentition  NECK: supple, no masses felt LYMPH:  1-2 cm bilateral lymphadenopathy noted in  cervical, axillary or inguinal regions LUNGS: clear to auscultation and  No wheeze or crackles HEART/CVS: regular rate & rhythm and no murmurs; No lower extremity edema; left knee in a brace. ABDOMEN:abdomen soft, non-tender and normal  bowel sounds Musculoskeletal:no cyanosis of digits and no clubbing  PSYCH: alert & oriented x 3 with fluent speech NEURO: no focal motor/sensory deficits SKIN:  no rashes or significant lesions; partial alopecia.   LABORATORY DATA:  I have reviewed the data as listed    Component Value Date/Time   NA 140 08/29/2016 0759   NA 144 03/26/2013 0823   K 4.2 08/29/2016 0759   K 4.0 03/26/2013 0823   CL 108 08/29/2016 0759   CL 106 03/26/2013 0823   CO2 26 08/29/2016 0759   CO2 28 03/26/2013 0823   GLUCOSE 113 (H) 08/29/2016 0759   GLUCOSE 96 03/26/2013 0823   BUN 17 08/29/2016 0759   BUN 12 03/26/2013 0823   CREATININE 0.43 (L) 08/29/2016 0759   CREATININE 0.64 12/17/2013 0947   CALCIUM 8.9 08/29/2016 0759   CALCIUM 8.6 03/26/2013 0823   PROT 6.3 (L) 08/29/2016 0759   PROT 6.6 12/17/2013 0947   ALBUMIN 4.3 08/29/2016 0759   ALBUMIN 4.0 12/17/2013 0947   AST 19 08/29/2016 0759   AST 19 12/17/2013 0947   ALT 13 (L) 08/29/2016 0759   ALT 26 12/17/2013 0947   ALKPHOS 41 08/29/2016 0759   ALKPHOS 63 12/17/2013 0947   BILITOT 0.8 08/29/2016 0759   BILITOT 0.4 12/17/2013 0947   GFRNONAA >60 08/29/2016 0759   GFRNONAA >60 09/24/2013 0854   GFRAA >60 08/29/2016 0759   GFRAA >60 09/24/2013 0854    No results found for: SPEP, UPEP  Lab Results  Component Value Date   WBC 8.0 08/29/2016   NEUTROABS 1.4 08/29/2016   HGB 10.6 (L) 08/29/2016   HCT 31.6 (L) 08/29/2016   MCV 105.8 (H) 08/29/2016   PLT 112 (L) 08/29/2016      Chemistry      Component Value Date/Time   NA 140 08/29/2016 0759   NA 144 03/26/2013 0823   K 4.2 08/29/2016 0759   K 4.0 03/26/2013 0823   CL 108 08/29/2016 0759   CL 106 03/26/2013 0823   CO2 26 08/29/2016 0759   CO2 28 03/26/2013 0823   BUN 17 08/29/2016 0759   BUN 12 03/26/2013 0823   CREATININE 0.43 (L) 08/29/2016 0759   CREATININE 0.64 12/17/2013 0947      Component Value Date/Time   CALCIUM 8.9 08/29/2016 0759   CALCIUM 8.6 03/26/2013  0823   ALKPHOS 41 08/29/2016 0759   ALKPHOS 63 12/17/2013 0947   AST 19 08/29/2016 0759   AST 19 12/17/2013 0947   ALT 13 (L) 08/29/2016 0759   ALT 26 12/17/2013 0947   BILITOT 0.8 08/29/2016 0759   BILITOT 0.4 12/17/2013 0947       RADIOGRAPHIC STUDIES: I have personally reviewed the radiological images as listed and agreed with the findings in the report. No results found.   ASSESSMENT & PLAN:  CLL (chronic lymphocytic leukemia) ([place]) # CLL/  recurrent stage  IV- today white count is 1 75,000 hemoglobin 11-23. However patient is getting more symptomatic [fatigue; weight loss; progressive lymphadenopathy].CT N-C-A-P- worsening LN; massive spleen. JAN 2018- FISH Positive for 13 Q del.  # Proceed with cycle # 2 day 1 today .Labs today reviewed;  acceptable for treatment today.   # Left knee trauma/ fracture- conservative management.   # Allopurinol  TLS prophylaxis; STOP as counts improved.   # Infectious prophylaxis with acyclovir/Bactrim.  # # Follow up in 3/14 MD/chemo/labs; 8 weeks.   # discussed with pt/son in detail.    Orders Placed This Encounter  Procedures  . CBC with Differential/Platelet    Standing Status:   Standing    Number of Occurrences:   4    Standing Expiration Date:   02/26/2017  . Comprehensive metabolic panel    Standing Status:   Standing    Number of Occurrences:   4    Standing Expiration Date:   02/26/2017   All questions were answered. The patient knows to call the clinic with any problems, questions or concerns.      Cammie Sickle, MD 08/29/2016 4:22 PM

## 2016-08-31 DIAGNOSIS — S82032A Displaced transverse fracture of left patella, initial encounter for closed fracture: Secondary | ICD-10-CM | POA: Diagnosis not present

## 2016-09-04 ENCOUNTER — Ambulatory Visit: Payer: PPO | Admitting: Internal Medicine

## 2016-09-04 ENCOUNTER — Other Ambulatory Visit: Payer: PPO

## 2016-09-14 DIAGNOSIS — S82032D Displaced transverse fracture of left patella, subsequent encounter for closed fracture with routine healing: Secondary | ICD-10-CM | POA: Diagnosis not present

## 2016-09-25 ENCOUNTER — Other Ambulatory Visit: Payer: Self-pay | Admitting: Internal Medicine

## 2016-09-26 ENCOUNTER — Inpatient Hospital Stay: Payer: PPO

## 2016-09-26 ENCOUNTER — Other Ambulatory Visit: Payer: PPO

## 2016-09-26 ENCOUNTER — Ambulatory Visit: Payer: PPO | Admitting: Internal Medicine

## 2016-09-26 ENCOUNTER — Ambulatory Visit: Payer: PPO

## 2016-09-26 ENCOUNTER — Inpatient Hospital Stay: Payer: PPO | Attending: Internal Medicine | Admitting: Internal Medicine

## 2016-09-26 VITALS — BP 150/86 | HR 76 | Temp 97.1°F | Resp 20 | Ht 67.0 in | Wt 128.4 lb

## 2016-09-26 DIAGNOSIS — E538 Deficiency of other specified B group vitamins: Secondary | ICD-10-CM | POA: Insufficient documentation

## 2016-09-26 DIAGNOSIS — D649 Anemia, unspecified: Secondary | ICD-10-CM | POA: Diagnosis not present

## 2016-09-26 DIAGNOSIS — Z79899 Other long term (current) drug therapy: Secondary | ICD-10-CM | POA: Diagnosis not present

## 2016-09-26 DIAGNOSIS — C9110 Chronic lymphocytic leukemia of B-cell type not having achieved remission: Secondary | ICD-10-CM | POA: Diagnosis not present

## 2016-09-26 DIAGNOSIS — Z5112 Encounter for antineoplastic immunotherapy: Secondary | ICD-10-CM | POA: Diagnosis not present

## 2016-09-26 DIAGNOSIS — F17210 Nicotine dependence, cigarettes, uncomplicated: Secondary | ICD-10-CM | POA: Diagnosis not present

## 2016-09-26 LAB — CBC WITH DIFFERENTIAL/PLATELET
BASOS ABS: 0 10*3/uL (ref 0–0.1)
BASOS PCT: 0 %
EOS PCT: 1 %
Eosinophils Absolute: 0.1 10*3/uL (ref 0–0.7)
HCT: 38.8 % (ref 35.0–47.0)
HEMOGLOBIN: 12.8 g/dL (ref 12.0–16.0)
Lymphocytes Relative: 69 %
Lymphs Abs: 5.3 10*3/uL — ABNORMAL HIGH (ref 1.0–3.6)
MCH: 33.9 pg (ref 26.0–34.0)
MCHC: 33 g/dL (ref 32.0–36.0)
MCV: 102.8 fL — ABNORMAL HIGH (ref 80.0–100.0)
MONO ABS: 0.2 10*3/uL (ref 0.2–0.9)
Monocytes Relative: 3 %
Neutro Abs: 2 10*3/uL (ref 1.4–6.5)
Neutrophils Relative %: 27 %
Platelets: 102 10*3/uL — ABNORMAL LOW (ref 150–440)
RBC: 3.78 MIL/uL — ABNORMAL LOW (ref 3.80–5.20)
RDW: 13.9 % (ref 11.5–14.5)
WBC: 7.6 10*3/uL (ref 3.6–11.0)

## 2016-09-26 LAB — COMPREHENSIVE METABOLIC PANEL
ALBUMIN: 4.8 g/dL (ref 3.5–5.0)
ALK PHOS: 43 U/L (ref 38–126)
ALT: 15 U/L (ref 14–54)
AST: 20 U/L (ref 15–41)
Anion gap: 6 (ref 5–15)
BUN: 14 mg/dL (ref 6–20)
CALCIUM: 9.2 mg/dL (ref 8.9–10.3)
CO2: 29 mmol/L (ref 22–32)
Chloride: 105 mmol/L (ref 101–111)
Creatinine, Ser: 0.57 mg/dL (ref 0.44–1.00)
GFR calc Af Amer: >60 mL/min (ref >60)
GFR calc non Af Amer: >60 mL/min (ref >60)
GLUCOSE: 108 mg/dL — AB (ref 65–99)
POTASSIUM: 4.6 mmol/L (ref 3.5–5.1)
SODIUM: 140 mmol/L (ref 135–145)
Total Bilirubin: 0.9 mg/dL (ref 0.3–1.2)
Total Protein: 6.7 g/dL (ref 6.5–8.1)

## 2016-09-26 MED ORDER — ACETAMINOPHEN 325 MG PO TABS
650.0000 mg | ORAL_TABLET | Freq: Once | ORAL | Status: AC
  Administered 2016-09-26: 650 mg via ORAL
  Filled 2016-09-26: qty 2

## 2016-09-26 MED ORDER — FAMOTIDINE IN NACL 20-0.9 MG/50ML-% IV SOLN
20.0000 mg | Freq: Once | INTRAVENOUS | Status: AC
  Administered 2016-09-26: 20 mg via INTRAVENOUS
  Filled 2016-09-26: qty 50

## 2016-09-26 MED ORDER — SODIUM CHLORIDE 0.9 % IV SOLN
Freq: Once | INTRAVENOUS | Status: AC
  Administered 2016-09-26: 10:00:00 via INTRAVENOUS
  Filled 2016-09-26: qty 1000

## 2016-09-26 MED ORDER — DIPHENHYDRAMINE HCL 50 MG/ML IJ SOLN
50.0000 mg | Freq: Once | INTRAMUSCULAR | Status: AC
  Administered 2016-09-26: 50 mg via INTRAVENOUS
  Filled 2016-09-26: qty 1

## 2016-09-26 MED ORDER — SODIUM CHLORIDE 0.9 % IV SOLN
20.0000 mg | Freq: Once | INTRAVENOUS | Status: AC
  Administered 2016-09-26: 20 mg via INTRAVENOUS
  Filled 2016-09-26: qty 2

## 2016-09-26 MED ORDER — SODIUM CHLORIDE 0.9 % IV SOLN
1000.0000 mg | Freq: Once | INTRAVENOUS | Status: AC
  Administered 2016-09-26: 1000 mg via INTRAVENOUS
  Filled 2016-09-26: qty 40

## 2016-09-26 NOTE — Assessment & Plan Note (Addendum)
#   CLL/  recurrent stage IV- elevated white count/lymphadenopathy/splenomegaly JAN 2018- FISH Positive for 13 Q del. Currently on Gazyva. Tolerating well. Significant clinical response noted   # Proceed with cycle # 2 day 1 today .Labs today reviewed;  acceptable for treatment today.   # Left knee trauma/ fracture- conservative management. Awaiting appt this Friday [Dr. Holden]  # Infectious prophylaxis with acyclovir/Bactrim.  # Follow up in 4/11 MD/chemo/labs; 5/09.   # discussed with pt.

## 2016-09-26 NOTE — Progress Notes (Signed)
[name] OFFICE PROGRESS NOTE  Patient Care Team: Juline Patch, MD as PCP - General (Family Medicine)  Cancer Staging No matching staging information was found for the patient.   Oncology History   # 2002- CLL [Dx- in [place] 2010- FCR x 4 cycles [Dr.Pandit]; Normal karyotype; PR; AUG 2012- Single agent Rituxan x7 [held sec to neutropenia];   # June 2015- Progression based on lymphocytosis- surveillance; Feb 2017- FISH- 13 q del/ IGVH MUTATED [good prognostic]  # Hx of skin rash [2012 s/p Bx-ant thigh lesion-vesicular spongiotic dermatitis]-resolved; B12 def [2012]     CLL (chronic lymphocytic leukemia) ([place])   02/28/2016 Initial Diagnosis    CLL (chronic lymphocytic leukemia) ([place])       INTERVAL HISTORY:  Maureen [name] 76 y.o.  female pleasant patient above history of CLL relapsed currently on [place] is here for follow-up. She is currently s/p cycle #1; Cycle #1 was interrupted because of her recent left knee fracture. There is being treated conservatively.  She denies any unusual headaches or vision changes. Denies any new lumps or bumps. No skin rash. Otherwise doing well. She is weightbearing. She is walking herself. No fevers or chills.  REVIEW OF SYSTEMS:  A complete 10 point review of system is done which is negative except mentioned above/history of present illness.   PAST MEDICAL HISTORY :  Past Medical History:  Diagnosis Date  . Anemia   . CLL (chronic lymphocytic leukemia) ([place])   . CLL (chronic lymphocytic leukemia) ([place])   . Itching   . Vitamin B 12 deficiency     PAST SURGICAL HISTORY :   Past Surgical History:  Procedure Laterality Date  . breeast mass removed  1988   benign    FAMILY HISTORY :  No family history on file.  SOCIAL HISTORY:   Social History  Substance Use Topics  . Smoking status: Current Every Day Smoker    Packs/day: 0.50    Types: Cigarettes  . Smokeless tobacco: Never Used  . Alcohol use Yes   Comment: occ.    ALLERGIES:  has No Known Allergies.  MEDICATIONS:  Current Outpatient Prescriptions  Medication Sig Dispense Refill  . acyclovir (ZOVIRAX) 200 MG capsule Take 1 capsule (200 mg total) by mouth 3 (three) times daily. 270 capsule 0  . betamethasone dipropionate (DIPROLENE) 0.05 % cream Apply topically 2 (two) times daily. 30 g 11  . Emollient (EUCERIN) lotion Apply topically as needed for dry skin. 240 mL 0  . ferrous sulfate 325 (65 FE) MG tablet Take 325 mg by mouth daily with breakfast.    . folic acid (FOLVITE) 671 MCG tablet Take 400 mcg by mouth daily.    Marland Kitchen sulfamethoxazole-trimethoprim (BACTRIM DS,SEPTRA DS) 800-160 MG tablet TAKE ONE TABLET BY MOUTH ON MONDAY,WEDNESDAY AND FRIDAY 36 tablet 3  . loratadine (CLARITIN) 10 MG tablet Take 1 tablet (10 mg total) by mouth daily. (Patient not taking: Reported on 09/26/2016) 30 tablet 11   No current facility-administered medications for this visit.     PHYSICAL EXAMINATION: ECOG PERFORMANCE STATUS: 0 - Asymptomatic  BP (!) 150/86 (Patient Position: Sitting)   Pulse 76   Temp 97.1 F (36.2 C) (Tympanic)   Resp 20   Ht '5\' 7"'$  (1.702 m)   Wt 128 lb 6.4 oz (58.2 kg)   BMI 20.11 kg/m   Filed Weights   09/26/16 0848  Weight: 128 lb 6.4 oz (58.2 kg)    GENERAL: Well-nourished well-developed; Alert, no distress  and comfortable.  Accompanied by her son. EYES: no pallor or icterus OROPHARYNX: no thrush or ulceration; good dentition  NECK: supple, no masses felt LYMPH:  1-2 cm bilateral lymphadenopathy noted in  cervical, axillary or inguinal regions LUNGS: clear to auscultation and  No wheeze or crackles HEART/CVS: regular rate & rhythm and no murmurs; No lower extremity edema; left knee in a brace. ABDOMEN:abdomen soft, non-tender and normal bowel sounds Musculoskeletal:no cyanosis of digits and no clubbing  PSYCH: alert & oriented x 3 with fluent speech NEURO: no focal motor/sensory deficits SKIN:  no rashes or  significant lesions; partial alopecia.   LABORATORY DATA:  I have reviewed the data as listed    Component Value Date/Time   NA 140 09/26/2016 0828   NA 144 03/26/2013 0823   K 4.6 09/26/2016 0828   K 4.0 03/26/2013 0823   CL 105 09/26/2016 0828   CL 106 03/26/2013 0823   CO2 29 09/26/2016 0828   CO2 28 03/26/2013 0823   GLUCOSE 108 (H) 09/26/2016 0828   GLUCOSE 96 03/26/2013 0823   BUN 14 09/26/2016 0828   BUN 12 03/26/2013 0823   CREATININE 0.57 09/26/2016 0828   CREATININE 0.64 12/17/2013 0947   CALCIUM 9.2 09/26/2016 0828   CALCIUM 8.6 03/26/2013 0823   PROT 6.7 09/26/2016 0828   PROT 6.6 12/17/2013 0947   ALBUMIN 4.8 09/26/2016 0828   ALBUMIN 4.0 12/17/2013 0947   AST 20 09/26/2016 0828   AST 19 12/17/2013 0947   ALT 15 09/26/2016 0828   ALT 26 12/17/2013 0947   ALKPHOS 43 09/26/2016 0828   ALKPHOS 63 12/17/2013 0947   BILITOT 0.9 09/26/2016 0828   BILITOT 0.4 12/17/2013 0947   GFRNONAA >60 09/26/2016 0828   GFRNONAA >60 09/24/2013 0854   GFRAA >60 09/26/2016 0828   GFRAA >60 09/24/2013 0854    No results found for: SPEP, UPEP  Lab Results  Component Value Date   WBC 7.6 09/26/2016   NEUTROABS 2.0 09/26/2016   HGB 12.8 09/26/2016   HCT 38.8 09/26/2016   MCV 102.8 (H) 09/26/2016   PLT 102 (L) 09/26/2016      Chemistry      Component Value Date/Time   NA 140 09/26/2016 0828   NA 144 03/26/2013 0823   K 4.6 09/26/2016 0828   K 4.0 03/26/2013 0823   CL 105 09/26/2016 0828   CL 106 03/26/2013 0823   CO2 29 09/26/2016 0828   CO2 28 03/26/2013 0823   BUN 14 09/26/2016 0828   BUN 12 03/26/2013 0823   CREATININE 0.57 09/26/2016 0828   CREATININE 0.64 12/17/2013 0947      Component Value Date/Time   CALCIUM 9.2 09/26/2016 0828   CALCIUM 8.6 03/26/2013 0823   ALKPHOS 43 09/26/2016 0828   ALKPHOS 63 12/17/2013 0947   AST 20 09/26/2016 0828   AST 19 12/17/2013 0947   ALT 15 09/26/2016 0828   ALT 26 12/17/2013 0947   BILITOT 0.9 09/26/2016 0828    BILITOT 0.4 12/17/2013 0947       RADIOGRAPHIC STUDIES: I have personally reviewed the radiological images as listed and agreed with the findings in the report. No results found.   ASSESSMENT & PLAN:  CLL (chronic lymphocytic leukemia) ([place]) # CLL/  recurrent stage IV- elevated white count/lymphadenopathy/splenomegaly JAN 2018- FISH Positive for 13 Q del. Currently on Gazyva. Tolerating well. Significant clinical response noted   # Proceed with cycle # 2 day 1 today .Labs today reviewed;  acceptable  for treatment today.   # Left knee trauma/ fracture- conservative management. Awaiting appt this Friday [Dr. Holden]  # Infectious prophylaxis with acyclovir/Bactrim.  # Follow up in 4/11 MD/chemo/labs; 5/09.   # discussed with pt.    No orders of the defined types were placed in this encounter.  All questions were answered. The patient knows to call the clinic with any problems, questions or concerns.      Cammie Sickle, MD 09/26/2016 1:14 PM

## 2016-09-28 DIAGNOSIS — S82032D Displaced transverse fracture of left patella, subsequent encounter for closed fracture with routine healing: Secondary | ICD-10-CM | POA: Diagnosis not present

## 2016-10-08 DIAGNOSIS — M25562 Pain in left knee: Secondary | ICD-10-CM | POA: Diagnosis not present

## 2016-10-08 DIAGNOSIS — M25561 Pain in right knee: Secondary | ICD-10-CM | POA: Diagnosis not present

## 2016-10-08 DIAGNOSIS — S82032D Displaced transverse fracture of left patella, subsequent encounter for closed fracture with routine healing: Secondary | ICD-10-CM | POA: Diagnosis not present

## 2016-10-08 DIAGNOSIS — M25551 Pain in right hip: Secondary | ICD-10-CM | POA: Diagnosis not present

## 2016-10-17 DIAGNOSIS — S82032D Displaced transverse fracture of left patella, subsequent encounter for closed fracture with routine healing: Secondary | ICD-10-CM | POA: Diagnosis not present

## 2016-10-17 DIAGNOSIS — M25551 Pain in right hip: Secondary | ICD-10-CM | POA: Diagnosis not present

## 2016-10-17 DIAGNOSIS — M25561 Pain in right knee: Secondary | ICD-10-CM | POA: Diagnosis not present

## 2016-10-17 DIAGNOSIS — M25562 Pain in left knee: Secondary | ICD-10-CM | POA: Diagnosis not present

## 2016-10-24 ENCOUNTER — Inpatient Hospital Stay (HOSPITAL_BASED_OUTPATIENT_CLINIC_OR_DEPARTMENT_OTHER): Payer: PPO | Admitting: Internal Medicine

## 2016-10-24 ENCOUNTER — Inpatient Hospital Stay: Payer: PPO

## 2016-10-24 ENCOUNTER — Inpatient Hospital Stay: Payer: PPO | Attending: Internal Medicine

## 2016-10-24 VITALS — BP 117/68 | HR 84 | Temp 98.3°F | Resp 16 | Wt 128.5 lb

## 2016-10-24 DIAGNOSIS — C9110 Chronic lymphocytic leukemia of B-cell type not having achieved remission: Secondary | ICD-10-CM

## 2016-10-24 DIAGNOSIS — M25562 Pain in left knee: Secondary | ICD-10-CM | POA: Diagnosis not present

## 2016-10-24 DIAGNOSIS — E559 Vitamin D deficiency, unspecified: Secondary | ICD-10-CM | POA: Diagnosis not present

## 2016-10-24 DIAGNOSIS — F17210 Nicotine dependence, cigarettes, uncomplicated: Secondary | ICD-10-CM | POA: Insufficient documentation

## 2016-10-24 DIAGNOSIS — D649 Anemia, unspecified: Secondary | ICD-10-CM | POA: Insufficient documentation

## 2016-10-24 DIAGNOSIS — Z79899 Other long term (current) drug therapy: Secondary | ICD-10-CM | POA: Insufficient documentation

## 2016-10-24 DIAGNOSIS — Z5111 Encounter for antineoplastic chemotherapy: Secondary | ICD-10-CM | POA: Insufficient documentation

## 2016-10-24 LAB — COMPREHENSIVE METABOLIC PANEL
ALBUMIN: 4.5 g/dL (ref 3.5–5.0)
ALT: 13 U/L — ABNORMAL LOW (ref 14–54)
AST: 18 U/L (ref 15–41)
Alkaline Phosphatase: 43 U/L (ref 38–126)
Anion gap: 8 (ref 5–15)
BILIRUBIN TOTAL: 0.6 mg/dL (ref 0.3–1.2)
BUN: 15 mg/dL (ref 6–20)
CHLORIDE: 106 mmol/L (ref 101–111)
CO2: 28 mmol/L (ref 22–32)
Calcium: 8.8 mg/dL — ABNORMAL LOW (ref 8.9–10.3)
Creatinine, Ser: 0.5 mg/dL (ref 0.44–1.00)
GFR calc Af Amer: >60 mL/min (ref >60)
GFR calc non Af Amer: >60 mL/min (ref >60)
GLUCOSE: 87 mg/dL (ref 65–99)
POTASSIUM: 3.9 mmol/L (ref 3.5–5.1)
Sodium: 142 mmol/L (ref 135–145)
Total Protein: 6.4 g/dL — ABNORMAL LOW (ref 6.5–8.1)

## 2016-10-24 LAB — CBC WITH DIFFERENTIAL/PLATELET
BASOS ABS: 0 10*3/uL (ref 0–0.1)
Basophils Relative: 0 %
Eosinophils Absolute: 0 10*3/uL (ref 0–0.7)
Eosinophils Relative: 1 %
HEMATOCRIT: 39.1 % (ref 35.0–47.0)
Hemoglobin: 13.2 g/dL (ref 12.0–16.0)
LYMPHS PCT: 48 %
Lymphs Abs: 1.9 10*3/uL (ref 1.0–3.6)
MCH: 32.8 pg (ref 26.0–34.0)
MCHC: 33.8 g/dL (ref 32.0–36.0)
MCV: 97.1 fL (ref 80.0–100.0)
MONO ABS: 0.2 10*3/uL (ref 0.2–0.9)
Monocytes Relative: 6 %
NEUTROS ABS: 1.7 10*3/uL (ref 1.4–6.5)
NEUTROS PCT: 45 %
Platelets: 86 10*3/uL — ABNORMAL LOW (ref 150–440)
RBC: 4.03 MIL/uL (ref 3.80–5.20)
RDW: 12.9 % (ref 11.5–14.5)
WBC: 3.9 10*3/uL (ref 3.6–11.0)

## 2016-10-24 MED ORDER — DIPHENHYDRAMINE HCL 50 MG/ML IJ SOLN
50.0000 mg | Freq: Once | INTRAMUSCULAR | Status: AC
  Administered 2016-10-24: 25 mg via INTRAVENOUS
  Filled 2016-10-24: qty 1

## 2016-10-24 MED ORDER — FAMOTIDINE IN NACL 20-0.9 MG/50ML-% IV SOLN
20.0000 mg | Freq: Once | INTRAVENOUS | Status: AC
  Administered 2016-10-24: 20 mg via INTRAVENOUS
  Filled 2016-10-24: qty 50

## 2016-10-24 MED ORDER — SODIUM CHLORIDE 0.9 % IV SOLN
20.0000 mg | Freq: Once | INTRAVENOUS | Status: AC
  Administered 2016-10-24: 20 mg via INTRAVENOUS
  Filled 2016-10-24: qty 2

## 2016-10-24 MED ORDER — SODIUM CHLORIDE 0.9 % IV SOLN
1000.0000 mg | Freq: Once | INTRAVENOUS | Status: AC
  Administered 2016-10-24: 1000 mg via INTRAVENOUS
  Filled 2016-10-24: qty 40

## 2016-10-24 MED ORDER — SODIUM CHLORIDE 0.9 % IV SOLN
Freq: Once | INTRAVENOUS | Status: AC
  Administered 2016-10-24: 09:00:00 via INTRAVENOUS
  Filled 2016-10-24: qty 1000

## 2016-10-24 MED ORDER — ACETAMINOPHEN 325 MG PO TABS
650.0000 mg | ORAL_TABLET | Freq: Once | ORAL | Status: AC
  Administered 2016-10-24: 650 mg via ORAL
  Filled 2016-10-24: qty 2

## 2016-10-24 NOTE — Progress Notes (Signed)
Patient here today for follow up.  Patient states no new concerns today  

## 2016-10-24 NOTE — Assessment & Plan Note (Addendum)
#   CLL/  recurrent stage IV- elevated white count/lymphadenopathy/splenomegaly JAN 2018- FISH Positive for 13 Q del. Currently on Gazyva. Tolerating well. Significant clinical response noted   # Proceed with cycle # 3 day 1 today .Labs today reviewed;  acceptable for treatment today except platelets-86.   # Left knee trauma/ fracture- conservative management. Ice packing; [Dr. Holden]  # Infectious prophylaxis with acyclovir/Bactrim.  #  Cbc in 2 weeks; follow up in 1 month/lab/treatments.

## 2016-10-24 NOTE — Progress Notes (Signed)
[facility] OFFICE PROGRESS NOTE  Patient Care Team: Juline Patch, MD as PCP - General (Family Medicine)  Cancer Staging No matching staging information was found for the patient.   Oncology History   # 2002- CLL [Dx- in [place] 2010- FCR x 4 cycles [Dr.Pandit]; Normal karyotype; PR; AUG 2012- Single agent Rituxan x7 [held sec to neutropenia];   # June 2015- Progression based on lymphocytosis- surveillance; Feb 2017- FISH- 13 q del/ IGVH MUTATED [good prognostic]  # Hx of skin rash [2012 s/p Bx-ant thigh lesion-vesicular spongiotic dermatitis]-resolved; B12 def [2012]     CLL (chronic lymphocytic leukemia) ([place])   02/28/2016 Initial Diagnosis    CLL (chronic lymphocytic leukemia) ([place])       INTERVAL HISTORY:  Maureen [name] 76 y.o.  female pleasant patient above history of CLL relapsed currently on [place] is here for follow-up. She is currently s/p cycle #2.  She denies any unusual headaches or vision changes. Denies any new lumps or bumps. No skin rash. Otherwise doing well. Complains of left knee pain however she is able to bear weight. She is walking herself. No fevers or chills.  REVIEW OF SYSTEMS:  A complete 10 point review of system is done which is negative except mentioned above/history of present illness.   PAST MEDICAL HISTORY :  Past Medical History:  Diagnosis Date  . Anemia   . CLL (chronic lymphocytic leukemia) ([place])   . CLL (chronic lymphocytic leukemia) ([place])   . Itching   . Vitamin B 12 deficiency     PAST SURGICAL HISTORY :   Past Surgical History:  Procedure Laterality Date  . breeast mass removed  1988   benign    FAMILY HISTORY :  No family history on file.  SOCIAL HISTORY:   Social History  Substance Use Topics  . Smoking status: Current Every Day Smoker    Packs/day: 0.50    Types: Cigarettes  . Smokeless tobacco: Never Used  . Alcohol use Yes     Comment: occ.    ALLERGIES:  has No Known  Allergies.  MEDICATIONS:  Current Outpatient Prescriptions  Medication Sig Dispense Refill  . acyclovir (ZOVIRAX) 200 MG capsule Take 1 capsule (200 mg total) by mouth 3 (three) times daily. 270 capsule 0  . betamethasone dipropionate (DIPROLENE) 0.05 % cream Apply topically 2 (two) times daily. 30 g 11  . Emollient (EUCERIN) lotion Apply topically as needed for dry skin. 240 mL 0  . ferrous sulfate 325 (65 FE) MG tablet Take 325 mg by mouth daily with breakfast.    . folic acid (FOLVITE) 423 MCG tablet Take 400 mcg by mouth daily.    Marland Kitchen loratadine (CLARITIN) 10 MG tablet Take 1 tablet (10 mg total) by mouth daily. 30 tablet 11  . sulfamethoxazole-trimethoprim (BACTRIM DS,SEPTRA DS) 800-160 MG tablet TAKE ONE TABLET BY MOUTH ON MONDAY,WEDNESDAY AND FRIDAY 36 tablet 3   No current facility-administered medications for this visit.     PHYSICAL EXAMINATION: ECOG PERFORMANCE STATUS: 0 - Asymptomatic  BP 117/68 (BP Location: Left Arm, Patient Position: Sitting)   Pulse 84   Temp 98.3 F (36.8 C) (Tympanic)   Resp 16   Wt 128 lb 8 oz (58.3 kg)   BMI 20.13 kg/m   Filed Weights   10/24/16 0823  Weight: 128 lb 8 oz (58.3 kg)    GENERAL: Well-nourished well-developed; Alert, no distress and comfortable. She is alone. EYES: no pallor or icterus OROPHARYNX: no thrush or  ulceration; good dentition  NECK: supple, no masses felt LYMPH:  No lymphadenopathy noted in  cervical, axillary or inguinal regions LUNGS: clear to auscultation and  No wheeze or crackles HEART/CVS: regular rate & rhythm and no murmurs; No lower extremity edema; mild swelling noted around the left knee. ABDOMEN:abdomen soft, non-tender and normal bowel sounds Musculoskeletal:no cyanosis of digits and no clubbing  PSYCH: alert & oriented x 3 with fluent speech NEURO: no focal motor/sensory deficits SKIN:  no rashes or significant lesions; partial alopecia.   LABORATORY DATA:  I have reviewed the data as listed     Component Value Date/Time   NA 142 10/24/2016 0755   NA 144 03/26/2013 0823   K 3.9 10/24/2016 0755   K 4.0 03/26/2013 0823   CL 106 10/24/2016 0755   CL 106 03/26/2013 0823   CO2 28 10/24/2016 0755   CO2 28 03/26/2013 0823   GLUCOSE 87 10/24/2016 0755   GLUCOSE 96 03/26/2013 0823   BUN 15 10/24/2016 0755   BUN 12 03/26/2013 0823   CREATININE 0.50 10/24/2016 0755   CREATININE 0.64 12/17/2013 0947   CALCIUM 8.8 (L) 10/24/2016 0755   CALCIUM 8.6 03/26/2013 0823   PROT 6.4 (L) 10/24/2016 0755   PROT 6.6 12/17/2013 0947   ALBUMIN 4.5 10/24/2016 0755   ALBUMIN 4.0 12/17/2013 0947   AST 18 10/24/2016 0755   AST 19 12/17/2013 0947   ALT 13 (L) 10/24/2016 0755   ALT 26 12/17/2013 0947   ALKPHOS 43 10/24/2016 0755   ALKPHOS 63 12/17/2013 0947   BILITOT 0.6 10/24/2016 0755   BILITOT 0.4 12/17/2013 0947   GFRNONAA >60 10/24/2016 0755   GFRNONAA >60 09/24/2013 0854   GFRAA >60 10/24/2016 0755   GFRAA >60 09/24/2013 0854    No results found for: SPEP, UPEP  Lab Results  Component Value Date   WBC 3.9 10/24/2016   NEUTROABS 1.7 10/24/2016   HGB 13.2 10/24/2016   HCT 39.1 10/24/2016   MCV 97.1 10/24/2016   PLT 86 (L) 10/24/2016      Chemistry      Component Value Date/Time   NA 142 10/24/2016 0755   NA 144 03/26/2013 0823   K 3.9 10/24/2016 0755   K 4.0 03/26/2013 0823   CL 106 10/24/2016 0755   CL 106 03/26/2013 0823   CO2 28 10/24/2016 0755   CO2 28 03/26/2013 0823   BUN 15 10/24/2016 0755   BUN 12 03/26/2013 0823   CREATININE 0.50 10/24/2016 0755   CREATININE 0.64 12/17/2013 0947      Component Value Date/Time   CALCIUM 8.8 (L) 10/24/2016 0755   CALCIUM 8.6 03/26/2013 0823   ALKPHOS 43 10/24/2016 0755   ALKPHOS 63 12/17/2013 0947   AST 18 10/24/2016 0755   AST 19 12/17/2013 0947   ALT 13 (L) 10/24/2016 0755   ALT 26 12/17/2013 0947   BILITOT 0.6 10/24/2016 0755   BILITOT 0.4 12/17/2013 0947       RADIOGRAPHIC STUDIES: I have personally reviewed the  radiological images as listed and agreed with the findings in the report. No results found.   ASSESSMENT & PLAN:  CLL (chronic lymphocytic leukemia) ([place]) # CLL/  recurrent stage IV- elevated white count/lymphadenopathy/splenomegaly JAN 2018- FISH Positive for 13 Q del. Currently on Gazyva. Tolerating well. Significant clinical response noted   # Proceed with cycle # 3 day 1 today .Labs today reviewed;  acceptable for treatment today except platelets-86.   # Left knee trauma/ fracture- conservative  management. Ice packing; [Dr. Holden]  # Infectious prophylaxis with acyclovir/Bactrim.  #  Cbc in 2 weeks; follow up in 1 month/lab/treatments.    No orders of the defined types were placed in this encounter.  All questions were answered. The patient knows to call the clinic with any problems, questions or concerns.      Cammie Sickle, MD 10/24/2016 12:37 PM

## 2016-10-25 DIAGNOSIS — M25562 Pain in left knee: Secondary | ICD-10-CM | POA: Diagnosis not present

## 2016-10-25 DIAGNOSIS — M25551 Pain in right hip: Secondary | ICD-10-CM | POA: Diagnosis not present

## 2016-10-25 DIAGNOSIS — M25561 Pain in right knee: Secondary | ICD-10-CM | POA: Diagnosis not present

## 2016-10-25 DIAGNOSIS — S82032D Displaced transverse fracture of left patella, subsequent encounter for closed fracture with routine healing: Secondary | ICD-10-CM | POA: Diagnosis not present

## 2016-10-26 DIAGNOSIS — S82032D Displaced transverse fracture of left patella, subsequent encounter for closed fracture with routine healing: Secondary | ICD-10-CM | POA: Diagnosis not present

## 2016-11-05 ENCOUNTER — Other Ambulatory Visit: Payer: Self-pay | Admitting: Internal Medicine

## 2016-11-07 ENCOUNTER — Inpatient Hospital Stay: Payer: PPO

## 2016-11-07 DIAGNOSIS — C9110 Chronic lymphocytic leukemia of B-cell type not having achieved remission: Secondary | ICD-10-CM

## 2016-11-07 DIAGNOSIS — Z5111 Encounter for antineoplastic chemotherapy: Secondary | ICD-10-CM | POA: Diagnosis not present

## 2016-11-07 LAB — COMPREHENSIVE METABOLIC PANEL
ALT: 12 U/L — AB (ref 14–54)
AST: 17 U/L (ref 15–41)
Albumin: 4.1 g/dL (ref 3.5–5.0)
Alkaline Phosphatase: 48 U/L (ref 38–126)
Anion gap: 4 — ABNORMAL LOW (ref 5–15)
BUN: 13 mg/dL (ref 6–20)
CHLORIDE: 106 mmol/L (ref 101–111)
CO2: 29 mmol/L (ref 22–32)
CREATININE: 0.5 mg/dL (ref 0.44–1.00)
Calcium: 8.5 mg/dL — ABNORMAL LOW (ref 8.9–10.3)
GFR calc Af Amer: >60 mL/min (ref >60)
GLUCOSE: 118 mg/dL — AB (ref 65–99)
POTASSIUM: 3.9 mmol/L (ref 3.5–5.1)
Sodium: 139 mmol/L (ref 135–145)
Total Bilirubin: 0.5 mg/dL (ref 0.3–1.2)
Total Protein: 6.2 g/dL — ABNORMAL LOW (ref 6.5–8.1)

## 2016-11-07 LAB — CBC WITH DIFFERENTIAL/PLATELET
Basophils Absolute: 0 10*3/uL (ref 0–0.1)
Basophils Relative: 1 %
EOS ABS: 0 10*3/uL (ref 0–0.7)
Eosinophils Relative: 1 %
HEMATOCRIT: 40 % (ref 35.0–47.0)
HEMOGLOBIN: 13 g/dL (ref 12.0–16.0)
LYMPHS ABS: 1.8 10*3/uL (ref 1.0–3.6)
Lymphocytes Relative: 55 %
MCH: 31.4 pg (ref 26.0–34.0)
MCHC: 32.6 g/dL (ref 32.0–36.0)
MCV: 96.5 fL (ref 80.0–100.0)
MONO ABS: 0.2 10*3/uL (ref 0.2–0.9)
MONOS PCT: 7 %
NEUTROS ABS: 1.2 10*3/uL — AB (ref 1.4–6.5)
NEUTROS PCT: 36 %
Platelets: 90 10*3/uL — ABNORMAL LOW (ref 150–440)
RBC: 4.15 MIL/uL (ref 3.80–5.20)
RDW: 12.9 % (ref 11.5–14.5)
WBC: 3.3 10*3/uL — ABNORMAL LOW (ref 3.6–11.0)

## 2016-11-21 ENCOUNTER — Inpatient Hospital Stay: Payer: PPO

## 2016-11-21 ENCOUNTER — Inpatient Hospital Stay (HOSPITAL_BASED_OUTPATIENT_CLINIC_OR_DEPARTMENT_OTHER): Payer: PPO | Admitting: Internal Medicine

## 2016-11-21 ENCOUNTER — Inpatient Hospital Stay: Payer: PPO | Attending: Internal Medicine

## 2016-11-21 VITALS — BP 138/66 | HR 72 | Temp 98.5°F | Resp 16 | Wt 129.1 lb

## 2016-11-21 DIAGNOSIS — D649 Anemia, unspecified: Secondary | ICD-10-CM | POA: Diagnosis not present

## 2016-11-21 DIAGNOSIS — C9110 Chronic lymphocytic leukemia of B-cell type not having achieved remission: Secondary | ICD-10-CM | POA: Diagnosis not present

## 2016-11-21 DIAGNOSIS — F17210 Nicotine dependence, cigarettes, uncomplicated: Secondary | ICD-10-CM

## 2016-11-21 DIAGNOSIS — E538 Deficiency of other specified B group vitamins: Secondary | ICD-10-CM | POA: Insufficient documentation

## 2016-11-21 DIAGNOSIS — Z5112 Encounter for antineoplastic immunotherapy: Secondary | ICD-10-CM | POA: Diagnosis not present

## 2016-11-21 DIAGNOSIS — M25562 Pain in left knee: Secondary | ICD-10-CM | POA: Insufficient documentation

## 2016-11-21 LAB — CBC WITH DIFFERENTIAL/PLATELET
BASOS ABS: 0 10*3/uL (ref 0–0.1)
BASOS PCT: 1 %
EOS ABS: 0 10*3/uL (ref 0–0.7)
Eosinophils Relative: 1 %
HCT: 41.1 % (ref 35.0–47.0)
HEMOGLOBIN: 13.8 g/dL (ref 12.0–16.0)
Lymphocytes Relative: 49 %
Lymphs Abs: 2.5 10*3/uL (ref 1.0–3.6)
MCH: 31.5 pg (ref 26.0–34.0)
MCHC: 33.6 g/dL (ref 32.0–36.0)
MCV: 93.6 fL (ref 80.0–100.0)
Monocytes Absolute: 0.3 10*3/uL (ref 0.2–0.9)
Monocytes Relative: 5 %
NEUTROS PCT: 44 %
Neutro Abs: 2.3 10*3/uL (ref 1.4–6.5)
Platelets: 90 10*3/uL — ABNORMAL LOW (ref 150–440)
RBC: 4.39 MIL/uL (ref 3.80–5.20)
RDW: 12.8 % (ref 11.5–14.5)
WBC: 5.1 10*3/uL (ref 3.6–11.0)

## 2016-11-21 LAB — COMPREHENSIVE METABOLIC PANEL
ALK PHOS: 48 U/L (ref 38–126)
ALT: 16 U/L (ref 14–54)
ANION GAP: 6 (ref 5–15)
AST: 22 U/L (ref 15–41)
Albumin: 4.5 g/dL (ref 3.5–5.0)
BUN: 18 mg/dL (ref 6–20)
CALCIUM: 9.2 mg/dL (ref 8.9–10.3)
CO2: 28 mmol/L (ref 22–32)
Chloride: 106 mmol/L (ref 101–111)
Creatinine, Ser: 0.47 mg/dL (ref 0.44–1.00)
GFR calc Af Amer: >60 mL/min (ref >60)
GFR calc non Af Amer: >60 mL/min (ref >60)
GLUCOSE: 101 mg/dL — AB (ref 65–99)
Potassium: 4.7 mmol/L (ref 3.5–5.1)
SODIUM: 140 mmol/L (ref 135–145)
Total Bilirubin: 0.9 mg/dL (ref 0.3–1.2)
Total Protein: 6.8 g/dL (ref 6.5–8.1)

## 2016-11-21 MED ORDER — SODIUM CHLORIDE 0.9 % IV SOLN
1000.0000 mg | Freq: Once | INTRAVENOUS | Status: AC
  Administered 2016-11-21: 1000 mg via INTRAVENOUS
  Filled 2016-11-21: qty 40

## 2016-11-21 MED ORDER — FAMOTIDINE IN NACL 20-0.9 MG/50ML-% IV SOLN
20.0000 mg | Freq: Once | INTRAVENOUS | Status: AC
  Administered 2016-11-21: 20 mg via INTRAVENOUS
  Filled 2016-11-21: qty 50

## 2016-11-21 MED ORDER — DIPHENHYDRAMINE HCL 50 MG/ML IJ SOLN
50.0000 mg | Freq: Once | INTRAMUSCULAR | Status: AC
  Administered 2016-11-21: 50 mg via INTRAVENOUS
  Filled 2016-11-21: qty 1

## 2016-11-21 MED ORDER — ACETAMINOPHEN 325 MG PO TABS
650.0000 mg | ORAL_TABLET | Freq: Once | ORAL | Status: AC
  Administered 2016-11-21: 650 mg via ORAL
  Filled 2016-11-21: qty 2

## 2016-11-21 MED ORDER — SODIUM CHLORIDE 0.9 % IV SOLN
Freq: Once | INTRAVENOUS | Status: AC
  Administered 2016-11-21: 09:00:00 via INTRAVENOUS
  Filled 2016-11-21: qty 1000

## 2016-11-21 MED ORDER — SODIUM CHLORIDE 0.9 % IV SOLN
20.0000 mg | Freq: Once | INTRAVENOUS | Status: AC
  Administered 2016-11-21: 20 mg via INTRAVENOUS
  Filled 2016-11-21: qty 2

## 2016-11-21 NOTE — Assessment & Plan Note (Addendum)
#   CLL/  recurrent stage IV- elevated white count/lymphadenopathy/splenomegaly JAN 2018- FISH Positive for 13 Q del. Currently on Gazyva. Tolerating well. Significant clinical response noted   # Proceed with cycle # 4 day 1 today .Labs today reviewed;  acceptable for treatment today except platelets-90   # Left knee trauma/ fracture- improving.   # Infectious prophylaxis with acyclovir/Bactrim.  #  Cbc in 2 weeks; follow up in 4 weeks [covering MD]; follow up with me on July 11th/chemo/labs.

## 2016-11-21 NOTE — Progress Notes (Signed)
[place] OFFICE PROGRESS NOTE  Patient Care Team: Juline Patch, MD as PCP - General (Family Medicine)  Cancer Staging No matching staging information was found for the patient.   Oncology History   # 2002- CLL [Dx- in [place] 2010- FCR x 4 cycles [Dr.Pandit]; Normal karyotype; PR; AUG 2012- Single agent Rituxan x7 [held sec to neutropenia];   # June 2015- Progression based on lymphocytosis- surveillance; Feb 2017- FISH- 13 q del/ IGVH MUTATED [good prognostic]  # Hx of skin rash [2012 s/p Bx-ant thigh lesion-vesicular spongiotic dermatitis]-resolved; B12 def [2012]     CLL (chronic lymphocytic leukemia) ([place])   02/28/2016 Initial Diagnosis    CLL (chronic lymphocytic leukemia) ([place])       INTERVAL HISTORY:  Maureen [name] 76 y.o.  female pleasant patient above history of CLL relapsed currently on [place] is here for follow-up. She is currently s/p cycle #3.  She is gaining weight. Denies any new lumps or bumps. No skin rash. Otherwise doing well. Complains of left knee pain however she is able to bear weight. She is walking herself; however she's continued to wear a brace on the left knee. No skin rash. No fevers or chills.  REVIEW OF SYSTEMS:  A complete 10 point review of system is done which is negative except mentioned above/history of present illness.   PAST MEDICAL HISTORY :  Past Medical History:  Diagnosis Date  . Anemia   . CLL (chronic lymphocytic leukemia) ([place])   . CLL (chronic lymphocytic leukemia) ([place])   . Itching   . Vitamin B 12 deficiency     PAST SURGICAL HISTORY :   Past Surgical History:  Procedure Laterality Date  . breeast mass removed  1988   benign    FAMILY HISTORY :  No family history on file.  SOCIAL HISTORY:   Social History  Substance Use Topics  . Smoking status: Current Every Day Smoker    Packs/day: 0.50    Types: Cigarettes  . Smokeless tobacco: Never Used  . Alcohol use Yes     Comment: occ.     ALLERGIES:  has No Known Allergies.  MEDICATIONS:  Current Outpatient Prescriptions  Medication Sig Dispense Refill  . acyclovir (ZOVIRAX) 200 MG capsule TAKE ONE CAPSULE BY MOUTH THREE TIMES DAILY 270 capsule 0  . betamethasone dipropionate (DIPROLENE) 0.05 % cream Apply topically 2 (two) times daily. 30 g 11  . Emollient (EUCERIN) lotion Apply topically as needed for dry skin. 240 mL 0  . ferrous sulfate 325 (65 FE) MG tablet Take 325 mg by mouth daily with breakfast.    . folic acid (FOLVITE) 161 MCG tablet Take 400 mcg by mouth daily.    Marland Kitchen loratadine (CLARITIN) 10 MG tablet Take 1 tablet (10 mg total) by mouth daily. 30 tablet 11  . sulfamethoxazole-trimethoprim (BACTRIM DS,SEPTRA DS) 800-160 MG tablet TAKE ONE TABLET BY MOUTH ON MONDAY,WEDNESDAY AND FRIDAY 36 tablet 3   No current facility-administered medications for this visit.     PHYSICAL EXAMINATION: ECOG PERFORMANCE STATUS: 0 - Asymptomatic  BP 138/66 (BP Location: Left Arm, Patient Position: Sitting)   Pulse 72   Temp 98.5 F (36.9 C) (Tympanic)   Resp 16   Wt 129 lb 2 oz (58.6 kg)   BMI 20.22 kg/m   Filed Weights   11/21/16 0840  Weight: 129 lb 2 oz (58.6 kg)    GENERAL: Well-nourished well-developed; Alert, no distress and comfortable. She is alone. EYES: no pallor  or icterus OROPHARYNX: no thrush or ulceration; good dentition  NECK: supple, no masses felt LYMPH:  No lymphadenopathy noted in  cervical, axillary or inguinal regions LUNGS: clear to auscultation and  No wheeze or crackles HEART/CVS: regular rate & rhythm and no murmurs; No lower extremity edema; mild swelling noted around the left knee. ABDOMEN:abdomen soft, non-tender and normal bowel sounds; splenomegaly improving. Musculoskeletal:no cyanosis of digits and no clubbing  PSYCH: alert & oriented x 3 with fluent speech NEURO: no focal motor/sensory deficits SKIN:  no rashes or significant lesions; partial alopecia.   LABORATORY DATA:  I  have reviewed the data as listed    Component Value Date/Time   NA 140 11/21/2016 0817   NA 144 03/26/2013 0823   K 4.7 11/21/2016 0817   K 4.0 03/26/2013 0823   CL 106 11/21/2016 0817   CL 106 03/26/2013 0823   CO2 28 11/21/2016 0817   CO2 28 03/26/2013 0823   GLUCOSE 101 (H) 11/21/2016 0817   GLUCOSE 96 03/26/2013 0823   BUN 18 11/21/2016 0817   BUN 12 03/26/2013 0823   CREATININE 0.47 11/21/2016 0817   CREATININE 0.64 12/17/2013 0947   CALCIUM 9.2 11/21/2016 0817   CALCIUM 8.6 03/26/2013 0823   PROT 6.8 11/21/2016 0817   PROT 6.6 12/17/2013 0947   ALBUMIN 4.5 11/21/2016 0817   ALBUMIN 4.0 12/17/2013 0947   AST 22 11/21/2016 0817   AST 19 12/17/2013 0947   ALT 16 11/21/2016 0817   ALT 26 12/17/2013 0947   ALKPHOS 48 11/21/2016 0817   ALKPHOS 63 12/17/2013 0947   BILITOT 0.9 11/21/2016 0817   BILITOT 0.4 12/17/2013 0947   GFRNONAA >60 11/21/2016 0817   GFRNONAA >60 09/24/2013 0854   GFRAA >60 11/21/2016 0817   GFRAA >60 09/24/2013 0854    No results found for: SPEP, UPEP  Lab Results  Component Value Date   WBC 5.1 11/21/2016   NEUTROABS 2.3 11/21/2016   HGB 13.8 11/21/2016   HCT 41.1 11/21/2016   MCV 93.6 11/21/2016   PLT 90 (L) 11/21/2016      Chemistry      Component Value Date/Time   NA 140 11/21/2016 0817   NA 144 03/26/2013 0823   K 4.7 11/21/2016 0817   K 4.0 03/26/2013 0823   CL 106 11/21/2016 0817   CL 106 03/26/2013 0823   CO2 28 11/21/2016 0817   CO2 28 03/26/2013 0823   BUN 18 11/21/2016 0817   BUN 12 03/26/2013 0823   CREATININE 0.47 11/21/2016 0817   CREATININE 0.64 12/17/2013 0947      Component Value Date/Time   CALCIUM 9.2 11/21/2016 0817   CALCIUM 8.6 03/26/2013 0823   ALKPHOS 48 11/21/2016 0817   ALKPHOS 63 12/17/2013 0947   AST 22 11/21/2016 0817   AST 19 12/17/2013 0947   ALT 16 11/21/2016 0817   ALT 26 12/17/2013 0947   BILITOT 0.9 11/21/2016 0817   BILITOT 0.4 12/17/2013 0947       RADIOGRAPHIC STUDIES: I have  personally reviewed the radiological images as listed and agreed with the findings in the report. No results found.   ASSESSMENT & PLAN:  CLL (chronic lymphocytic leukemia) ([place]) # CLL/  recurrent stage IV- elevated white count/lymphadenopathy/splenomegaly JAN 2018- FISH Positive for 13 Q del. Currently on Gazyva. Tolerating well. Significant clinical response noted   # Proceed with cycle # 4 day 1 today .Labs today reviewed;  acceptable for treatment today except platelets-90   # Left  knee trauma/ fracture- improving.   # Infectious prophylaxis with acyclovir/Bactrim.  #  Cbc in 2 weeks; follow up in 4 weeks [covering MD]; follow up with me on July 11th/chemo/labs.    Orders Placed This Encounter  Procedures  . CBC with Differential/Platelet    Standing Status:   Future    Standing Expiration Date:   11/21/2017  . CBC with Differential/Platelet    Standing Status:   Future    Standing Expiration Date:   11/21/2017  . Comprehensive metabolic panel    Standing Status:   Future    Standing Expiration Date:   11/21/2017  . CBC with Differential/Platelet    Standing Status:   Future    Standing Expiration Date:   11/21/2017  . Comprehensive metabolic panel    Standing Status:   Future    Standing Expiration Date:   11/21/2017   All questions were answered. The patient knows to call the clinic with any problems, questions or concerns.      Cammie Sickle, MD 11/21/2016 4:27 PM

## 2016-11-21 NOTE — Progress Notes (Signed)
Patient here today for follow up.  Patient states no new concerns today  

## 2016-12-05 ENCOUNTER — Inpatient Hospital Stay: Payer: PPO

## 2016-12-07 ENCOUNTER — Inpatient Hospital Stay: Payer: PPO

## 2016-12-07 DIAGNOSIS — Z5112 Encounter for antineoplastic immunotherapy: Secondary | ICD-10-CM | POA: Diagnosis not present

## 2016-12-07 DIAGNOSIS — C9110 Chronic lymphocytic leukemia of B-cell type not having achieved remission: Secondary | ICD-10-CM

## 2016-12-07 LAB — CBC WITH DIFFERENTIAL/PLATELET
BASOS ABS: 0 10*3/uL (ref 0–0.1)
Basophils Relative: 1 %
Eosinophils Absolute: 0 10*3/uL (ref 0–0.7)
Eosinophils Relative: 1 %
HEMATOCRIT: 40.4 % (ref 35.0–47.0)
Hemoglobin: 13.5 g/dL (ref 12.0–16.0)
LYMPHS ABS: 2 10*3/uL (ref 1.0–3.6)
LYMPHS PCT: 65 %
MCH: 30.6 pg (ref 26.0–34.0)
MCHC: 33.4 g/dL (ref 32.0–36.0)
MCV: 91.6 fL (ref 80.0–100.0)
MONO ABS: 0.3 10*3/uL (ref 0.2–0.9)
Monocytes Relative: 9 %
NEUTROS ABS: 0.7 10*3/uL — AB (ref 1.4–6.5)
Neutrophils Relative %: 24 %
Platelets: 79 10*3/uL — ABNORMAL LOW (ref 150–440)
RBC: 4.42 MIL/uL (ref 3.80–5.20)
RDW: 12.9 % (ref 11.5–14.5)
WBC: 3.1 10*3/uL — AB (ref 3.6–11.0)

## 2016-12-17 ENCOUNTER — Ambulatory Visit (INDEPENDENT_AMBULATORY_CARE_PROVIDER_SITE_OTHER): Payer: PPO

## 2016-12-17 VITALS — BP 126/60 | HR 60 | Temp 97.9°F | Resp 16 | Ht 67.0 in | Wt 130.2 lb

## 2016-12-17 DIAGNOSIS — Z0000 Encounter for general adult medical examination without abnormal findings: Secondary | ICD-10-CM

## 2016-12-17 DIAGNOSIS — Z13820 Encounter for screening for osteoporosis: Secondary | ICD-10-CM

## 2016-12-17 NOTE — Patient Instructions (Addendum)
Maureen [name] , Thank you for taking time to come for your Medicare Wellness Visit. I appreciate your ongoing commitment to your health goals. Please review the following plan we discussed and let me know if I can assist you in the future.   Screening recommendations/referrals: Colonoscopy: no longer required Mammogram: no longer required Bone Density: due - will send referral Recommended yearly ophthalmology/optometry visit for glaucoma screening and checkup Recommended yearly dental visit for hygiene and checkup  Vaccinations: Influenza vaccine: up to date, due 03/2017 Pneumococcal vaccine: up to date Tdap vaccine: due, check with your insurance company for coverage Shingles vaccine:  due, check with your insurance company for coverage  Advanced directives:Advance directive discussed with you today. I have provided a copy for you to complete at home and have notarized. Once this is complete please bring a copy in to our office so we can scan it into your chart.  Conditions/risks identified: Recommend drinking at least 4-5 glasses of water a day.   Next appointment: Follow up in one year for your annual wellness exam   Preventive Care 65 Years and Older, Female Preventive care refers to lifestyle choices and visits with your health care provider that can promote health and wellness. What does preventive care include?  A yearly physical exam. This is also called an annual well check.  Dental exams once or twice a year.  Routine eye exams. Ask your health care provider how often you should have your eyes checked.  Personal lifestyle choices, including:  Daily care of your teeth and gums.  Regular physical activity.  Eating a healthy diet.  Avoiding tobacco and drug use.  Limiting alcohol use.  Practicing safe sex.  Taking low-dose aspirin every day.  Taking vitamin and mineral supplements as recommended by your health care provider. What happens during an annual well  check? The services and screenings done by your health care provider during your annual well check will depend on your age, overall health, lifestyle risk factors, and family history of disease. Counseling  Your health care provider may ask you questions about your:  Alcohol use.  Tobacco use.  Drug use.  Emotional well-being.  Home and relationship well-being.  Sexual activity.  Eating habits.  History of falls.  Memory and ability to understand (cognition).  Work and work Statistician.  Reproductive health. Screening  You may have the following tests or measurements:  Height, weight, and BMI.  Blood pressure.  Lipid and cholesterol levels. These may be checked every 5 years, or more frequently if you are over 63 years old.  Skin check.  Lung cancer screening. You may have this screening every year starting at age 61 if you have a 30-pack-year history of smoking and currently smoke or have quit within the past 15 years.  Fecal occult blood test (FOBT) of the stool. You may have this test every year starting at age 82.  Flexible sigmoidoscopy or colonoscopy. You may have a sigmoidoscopy every 5 years or a colonoscopy every 10 years starting at age 41.  Hepatitis C blood test.  Hepatitis B blood test.  Sexually transmitted disease (STD) testing.  Diabetes screening. This is done by checking your blood sugar (glucose) after you have not eaten for a while (fasting). You may have this done every 1-3 years.  Bone density scan. This is done to screen for osteoporosis. You may have this done starting at age 60.  Mammogram. This may be done every 1-2 years. Talk to your health care  provider about how often you should have regular mammograms. Talk with your health care provider about your test results, treatment options, and if necessary, the need for more tests. Vaccines  Your health care provider may recommend certain vaccines, such as:  Influenza vaccine. This is  recommended every year.  Tetanus, diphtheria, and acellular pertussis (Tdap, Td) vaccine. You may need a Td booster every 10 years.  Zoster vaccine. You may need this after age 49.  Pneumococcal 13-valent conjugate (PCV13) vaccine. One dose is recommended after age 42.  Pneumococcal polysaccharide (PPSV23) vaccine. One dose is recommended after age 76. Talk to your health care provider about which screenings and vaccines you need and how often you need them. This information is not intended to replace advice given to you by your health care provider. Make sure you discuss any questions you have with your health care provider. Document Released: 07/29/2015 Document Revised: 03/21/2016 Document Reviewed: 05/03/2015 Elsevier Interactive Patient Education  2017 [name] Prevention in the Home Falls can cause injuries. They can happen to people of all ages. There are many things you can do to make your home safe and to help prevent falls. What can I do on the outside of my home?  Regularly fix the edges of walkways and driveways and fix any cracks.  Remove anything that might make you trip as you walk through a door, such as a raised step or threshold.  Trim any bushes or trees on the path to your home.  Use bright outdoor lighting.  Clear any walking paths of anything that might make someone trip, such as rocks or tools.  Regularly check to see if handrails are loose or broken. Make sure that both sides of any steps have handrails.  Any raised decks and porches should have guardrails on the edges.  Have any leaves, snow, or ice cleared regularly.  Use sand or salt on walking paths during winter.  Clean up any spills in your garage right away. This includes oil or grease spills. What can I do in the bathroom?  Use night lights.  Install grab bars by the toilet and in the tub and shower. Do not use towel bars as grab bars.  Use non-skid mats or decals in the tub or  shower.  If you need to sit down in the shower, use a plastic, non-slip stool.  Keep the floor dry. Clean up any water that spills on the floor as soon as it happens.  Remove soap buildup in the tub or shower regularly.  Attach bath mats securely with double-sided non-slip rug tape.  Do not have throw rugs and other things on the floor that can make you trip. What can I do in the bedroom?  Use night lights.  Make sure that you have a light by your bed that is easy to reach.  Do not use any sheets or blankets that are too big for your bed. They should not hang down onto the floor.  Have a firm chair that has side arms. You can use this for support while you get dressed.  Do not have throw rugs and other things on the floor that can make you trip. What can I do in the kitchen?  Clean up any spills right away.  Avoid walking on wet floors.  Keep items that you use a lot in easy-to-reach places.  If you need to reach something above you, use a strong step stool that has a grab bar.  Keep electrical cords out of the way.  Do not use floor polish or wax that makes floors slippery. If you must use wax, use non-skid floor wax.  Do not have throw rugs and other things on the floor that can make you trip. What can I do with my stairs?  Do not leave any items on the stairs.  Make sure that there are handrails on both sides of the stairs and use them. Fix handrails that are broken or loose. Make sure that handrails are as long as the stairways.  Check any carpeting to make sure that it is firmly attached to the stairs. Fix any carpet that is loose or worn.  Avoid having throw rugs at the top or bottom of the stairs. If you do have throw rugs, attach them to the floor with carpet tape.  Make sure that you have a light switch at the top of the stairs and the bottom of the stairs. If you do not have them, ask someone to add them for you. What else can I do to help prevent  falls?  Wear shoes that:  Do not have high heels.  Have rubber bottoms.  Are comfortable and fit you well.  Are closed at the toe. Do not wear sandals.  If you use a stepladder:  Make sure that it is fully opened. Do not climb a closed stepladder.  Make sure that both sides of the stepladder are locked into place.  Ask someone to hold it for you, if possible.  Clearly mark and make sure that you can see:  Any grab bars or handrails.  First and last steps.  Where the edge of each step is.  Use tools that help you move around (mobility aids) if they are needed. These include:  Canes.  Walkers.  Scooters.  Crutches.  Turn on the lights when you go into a dark area. Replace any light bulbs as soon as they burn out.  Set up your furniture so you have a clear path. Avoid moving your furniture around.  If any of your floors are uneven, fix them.  If there are any pets around you, be aware of where they are.  Review your medicines with your doctor. Some medicines can make you feel dizzy. This can increase your chance of falling. Ask your doctor what other things that you can do to help prevent falls. This information is not intended to replace advice given to you by your health care provider. Make sure you discuss any questions you have with your health care provider. Document Released: 04/28/2009 Document Revised: 12/08/2015 Document Reviewed: 08/06/2014 Elsevier Interactive Patient Education  2017 [employer].

## 2016-12-17 NOTE — Progress Notes (Signed)
Subjective:   Maureen [name] is a 76 y.o. female who presents for an Initial Medicare Annual Wellness Visit.  Review of Systems      Cardiac Risk Factors include: advanced age (>68men, >54 women);smoking/ tobacco exposure     Objective:    Today's Vitals   12/17/16 1118  BP: 126/60  Pulse: 60  Resp: 16  Temp: 97.9 F (36.6 C)  Weight: 130 lb 3.2 oz (59.1 kg)  Height: 5\' 7"  (1.702 m)   Body mass index is 20.39 kg/m.   Current Medications (verified) Outpatient Encounter Prescriptions as of 12/17/2016  Medication Sig  . acyclovir (ZOVIRAX) 200 MG capsule TAKE ONE CAPSULE BY MOUTH THREE TIMES DAILY  . betamethasone dipropionate (DIPROLENE) 0.05 % cream Apply topically 2 (two) times daily.  . Emollient (EUCERIN) lotion Apply topically as needed for dry skin.  . ferrous sulfate 325 (65 FE) MG tablet Take 325 mg by mouth daily with breakfast.  . folic acid (FOLVITE) 299 MCG tablet Take 400 mcg by mouth daily.  Marland Kitchen loratadine (CLARITIN) 10 MG tablet Take 1 tablet (10 mg total) by mouth daily.  Marland Kitchen sulfamethoxazole-trimethoprim (BACTRIM DS,SEPTRA DS) 800-160 MG tablet TAKE ONE TABLET BY MOUTH ON MONDAY,WEDNESDAY AND FRIDAY   No facility-administered encounter medications on file as of 12/17/2016.     Allergies (verified) Patient has no known allergies.   History: Past Medical History:  Diagnosis Date  . Anemia   . CLL (chronic lymphocytic leukemia) ([place])   . CLL (chronic lymphocytic leukemia) ([place])   . Itching   . Vitamin B 12 deficiency    Past Surgical History:  Procedure Laterality Date  . breeast mass removed  1988   benign   History reviewed. No pertinent family history. Social History   Occupational History  . Not on file.   Social History Main Topics  . Smoking status: Current Every Day Smoker    Packs/day: 0.50    Types: Cigarettes  . Smokeless tobacco: Never Used  . Alcohol use Yes     Comment: occ.  . Drug use: No  . Sexual activity: Not Currently      Tobacco Counseling Ready to quit: Yes Counseling given: Yes   Activities of Daily Living In your present state of health, do you have any difficulty performing the following activities: 12/17/2016  Hearing? N  Vision? N  Difficulty concentrating or making decisions? N  Walking or climbing stairs? N  Dressing or bathing? N  Doing errands, shopping? N  Preparing Food and eating ? N  Using the Toilet? N  In the past six months, have you accidently leaked urine? N  Do you have problems with loss of bowel control? N  Managing your Medications? N  Managing your Finances? N  Housekeeping or managing your Housekeeping? N  Some recent data might be hidden    Immunizations and Health Maintenance Immunization History  Administered Date(s) Administered  . Influenza,inj,Quad PF,36+ Mos 04/07/2015  . Influenza-Unspecified 04/15/2014, 04/06/2016  . Pneumococcal-Unspecified 04/06/2016   Health Maintenance Due  Topic Date Due  . DEXA SCAN  04/28/2004    Patient Care Team: Juline Patch, MD as PCP - General (Family Medicine) Earnestine Leys, MD (Specialist) Cammie Sickle, MD as Consulting Physician (Internal Medicine)  Indicate any recent Medical Services you may have received from other than [facility] providers in the past year (date may be approximate).     Assessment:   This is a routine wellness examination for [place].  Hearing/Vision screen Vision Screening Comments: Goes to [facility] annually  Dietary issues and exercise activities discussed: Current Exercise Habits: The patient does not participate in regular exercise at present, Exercise limited by: orthopedic condition(s);Other - see comments (recieving chemo)  Goals    . Increase water intake          Recommend drinking at least 4-5 glasses of water a day.       Depression Screen PHQ 2/9 Scores 12/17/2016 03/20/2016  PHQ - 2 Score 0 1    Fall Risk Fall Risk  12/17/2016 03/20/2016  Falls in the past  year? No No    Cognitive Function:     6CIT Screen 12/17/2016  What Year? 0 points  What month? 0 points  What time? 0 points  Count back from 20 0 points  Months in reverse 0 points  Repeat phrase 2 points  Total Score 2    Screening Tests Health Maintenance  Topic Date Due  . DEXA SCAN  04/28/2004  . TETANUS/TDAP  07/16/2017 (Originally 04/28/1960)  . INFLUENZA VACCINE  02/13/2017  . PNA vac Low Risk Adult (2 of 2 - PCV13) 04/06/2017      Plan:    I have personally reviewed and addressed the Medicare Annual Wellness questionnaire and have noted the following in the patient's chart:  A. Medical and social history B. Use of alcohol, tobacco or illicit drugs  C. Current medications and supplements D. Functional ability and status E.  Nutritional status F.  Physical activity G. Advance directives H. List of other physicians I.  Hospitalizations, surgeries, and ER visits in previous 12 months J.  [place] such as hearing and vision if needed, cognitive and depression L. Referrals and appointments - none  In addition, I have reviewed and discussed with patient certain preventive protocols, quality metrics, and best practice recommendations. A written personalized care plan for preventive services as well as general preventive health recommendations were provided to patient.   Signed,  Tyler Aas, LPN Nurse Health Advisor   MD Recommendations: none

## 2016-12-18 NOTE — Progress Notes (Signed)
[facility]  [phone] [phone]  ID: Maureen [name] OB: Oct 15, 1940  MR#: [id number]  CSN#:[id number]  Patient Care Team: Maureen Patch, MD as PCP - General (Family Medicine) Maureen Leys, MD (Specialist) Maureen Sickle, MD as Consulting Physician (Internal Medicine)  CHIEF COMPLAINT: CLL  INTERVAL HISTORY: Patient returns to clinic today for further evaluation and continuation of Gazyva. She is tolerating her treatments well without significant side effects. She has no neurologic complaints. She denies any recent fevers or illnesses. She has good appetite and denies weight loss. She has no chest pain or shortness of breath. She denies any nausea, vomiting, constipation, or diarrhea. She has no urinary complaints. Patient feels at her baseline and offers no specific complaints today.   REVIEW OF SYSTEMS:   Review of Systems  Constitutional: Negative.  Negative for fever, malaise/fatigue and weight loss.  Respiratory: Negative.  Negative for cough and shortness of breath.   Cardiovascular: Negative.  Negative for chest pain and leg swelling.  Gastrointestinal: Negative.  Negative for abdominal pain.  Genitourinary: Negative.   Musculoskeletal: Negative.   Skin: Negative.  Negative for rash.  Neurological: Negative.  Negative for sensory change and weakness.  Psychiatric/Behavioral: Negative.  The patient is not nervous/anxious.     As per HPI. Otherwise, a complete review of systems is negative.  PAST MEDICAL HISTORY: Past Medical History:  Diagnosis Date  . Anemia   . CLL (chronic lymphocytic leukemia) ([place])   . CLL (chronic lymphocytic leukemia) ([place])   . Itching   . Vitamin B 12 deficiency     PAST SURGICAL HISTORY: Past Surgical History:  Procedure Laterality Date  . breeast mass removed  1988   benign    FAMILY HISTORY: No family history on file.  ADVANCED DIRECTIVES (Y/N):  N  HEALTH MAINTENANCE: Social  History  Substance Use Topics  . Smoking status: Current Every Day Smoker    Packs/day: 0.50    Types: Cigarettes  . Smokeless tobacco: Never Used  . Alcohol use Yes     Comment: occ.     Colonoscopy:  PAP:  Bone density:  Lipid panel:  No Known Allergies  Current Outpatient Prescriptions  Medication Sig Dispense Refill  . acyclovir (ZOVIRAX) 200 MG capsule TAKE ONE CAPSULE BY MOUTH THREE TIMES DAILY 270 capsule 0  . betamethasone dipropionate (DIPROLENE) 0.05 % cream Apply topically 2 (two) times daily. 30 g 11  . Emollient (EUCERIN) lotion Apply topically as needed for dry skin. 240 mL 0  . ferrous sulfate 325 (65 FE) MG tablet Take 325 mg by mouth daily with breakfast.    . folic acid (FOLVITE) 814 MCG tablet Take 400 mcg by mouth daily.    Marland Kitchen loratadine (CLARITIN) 10 MG tablet Take 1 tablet (10 mg total) by mouth daily. 30 tablet 11  . sulfamethoxazole-trimethoprim (BACTRIM DS,SEPTRA DS) 800-160 MG tablet TAKE ONE TABLET BY MOUTH ON MONDAY,WEDNESDAY AND FRIDAY 36 tablet 3   No current facility-administered medications for this visit.     OBJECTIVE: There were no vitals filed for this visit.   There is no height or weight on file to calculate BMI.    ECOG FS:1 - Symptomatic but completely ambulatory  General: Well-developed, well-nourished, no acute distress. Eyes: Pink conjunctiva, anicteric sclera. Lungs: Clear to auscultation bilaterally. Heart: Regular rate and rhythm. No rubs, murmurs, or gallops. Abdomen: Soft, nontender, nondistended. No organomegaly noted, normoactive bowel sounds. Musculoskeletal: No edema, cyanosis, or clubbing. Neuro: Alert, answering all questions  appropriately. Cranial nerves grossly intact. Skin: No rashes or petechiae noted. Psych: Normal affect.   LAB RESULTS:  Lab Results  Component Value Date   NA 138 12/19/2016   K 4.1 12/19/2016   CL 105 12/19/2016   CO2 29 12/19/2016   GLUCOSE 101 (H) 12/19/2016   BUN 15 12/19/2016    CREATININE 0.44 12/19/2016   CALCIUM 9.1 12/19/2016   PROT 6.6 12/19/2016   ALBUMIN 4.5 12/19/2016   AST 18 12/19/2016   ALT 13 (L) 12/19/2016   ALKPHOS 46 12/19/2016   BILITOT 0.9 12/19/2016   GFRNONAA >60 12/19/2016   GFRAA >60 12/19/2016    Lab Results  Component Value Date   WBC 4.3 12/19/2016   NEUTROABS 1.5 12/19/2016   HGB 13.5 12/19/2016   HCT 40.2 12/19/2016   MCV 91.0 12/19/2016   PLT 108 (L) 12/19/2016     STUDIES: No results found.  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 Currently on Gazyva. Tolerating well. Significant clinical response noted. Proceed with treatment today.  # Thrombocytopenia- decreased, but approximately patient's baseline. Proceed with treatment as above.  # Left knee trauma/ fracture- improving.   # Infectious prophylaxis- continue with acyclovir/Bactrim.  #   return to clinic as scheduled and then on July 11th/chemo/labs.   Patient expressed understanding and was in agreement with this plan. She also understands that She can call clinic at any time with any questions, concerns, or complaints.    Maureen Huger, MD   12/25/2016 12:59 PM

## 2016-12-19 ENCOUNTER — Inpatient Hospital Stay: Payer: PPO

## 2016-12-19 ENCOUNTER — Inpatient Hospital Stay: Payer: PPO | Attending: Oncology

## 2016-12-19 ENCOUNTER — Inpatient Hospital Stay (HOSPITAL_BASED_OUTPATIENT_CLINIC_OR_DEPARTMENT_OTHER): Payer: PPO | Admitting: Oncology

## 2016-12-19 VITALS — BP 158/87 | HR 72 | Temp 97.0°F | Resp 16

## 2016-12-19 DIAGNOSIS — D696 Thrombocytopenia, unspecified: Secondary | ICD-10-CM | POA: Insufficient documentation

## 2016-12-19 DIAGNOSIS — L299 Pruritus, unspecified: Secondary | ICD-10-CM | POA: Diagnosis not present

## 2016-12-19 DIAGNOSIS — F17210 Nicotine dependence, cigarettes, uncomplicated: Secondary | ICD-10-CM

## 2016-12-19 DIAGNOSIS — E538 Deficiency of other specified B group vitamins: Secondary | ICD-10-CM | POA: Diagnosis not present

## 2016-12-19 DIAGNOSIS — Z79899 Other long term (current) drug therapy: Secondary | ICD-10-CM | POA: Diagnosis not present

## 2016-12-19 DIAGNOSIS — D649 Anemia, unspecified: Secondary | ICD-10-CM

## 2016-12-19 DIAGNOSIS — C9110 Chronic lymphocytic leukemia of B-cell type not having achieved remission: Secondary | ICD-10-CM

## 2016-12-19 DIAGNOSIS — Z5111 Encounter for antineoplastic chemotherapy: Secondary | ICD-10-CM | POA: Diagnosis not present

## 2016-12-19 LAB — COMPREHENSIVE METABOLIC PANEL
ALBUMIN: 4.5 g/dL (ref 3.5–5.0)
ALT: 13 U/L — AB (ref 14–54)
AST: 18 U/L (ref 15–41)
Alkaline Phosphatase: 46 U/L (ref 38–126)
Anion gap: 4 — ABNORMAL LOW (ref 5–15)
BILIRUBIN TOTAL: 0.9 mg/dL (ref 0.3–1.2)
BUN: 15 mg/dL (ref 6–20)
CHLORIDE: 105 mmol/L (ref 101–111)
CO2: 29 mmol/L (ref 22–32)
CREATININE: 0.44 mg/dL (ref 0.44–1.00)
Calcium: 9.1 mg/dL (ref 8.9–10.3)
GFR calc Af Amer: >60 mL/min (ref >60)
Glucose, Bld: 101 mg/dL — ABNORMAL HIGH (ref 65–99)
Potassium: 4.1 mmol/L (ref 3.5–5.1)
Sodium: 138 mmol/L (ref 135–145)
TOTAL PROTEIN: 6.6 g/dL (ref 6.5–8.1)

## 2016-12-19 LAB — CBC WITH DIFFERENTIAL/PLATELET
BASOS ABS: 0 10*3/uL (ref 0–0.1)
BASOS PCT: 0 %
Eosinophils Absolute: 0.1 10*3/uL (ref 0–0.7)
Eosinophils Relative: 2 %
HEMATOCRIT: 40.2 % (ref 35.0–47.0)
Hemoglobin: 13.5 g/dL (ref 12.0–16.0)
LYMPHS PCT: 54 %
Lymphs Abs: 2.3 10*3/uL (ref 1.0–3.6)
MCH: 30.5 pg (ref 26.0–34.0)
MCHC: 33.6 g/dL (ref 32.0–36.0)
MCV: 91 fL (ref 80.0–100.0)
Monocytes Absolute: 0.4 10*3/uL (ref 0.2–0.9)
Monocytes Relative: 9 %
NEUTROS ABS: 1.5 10*3/uL (ref 1.4–6.5)
NEUTROS PCT: 35 %
Platelets: 108 10*3/uL — ABNORMAL LOW (ref 150–440)
RBC: 4.42 MIL/uL (ref 3.80–5.20)
RDW: 13.1 % (ref 11.5–14.5)
WBC: 4.3 10*3/uL (ref 3.6–11.0)

## 2016-12-19 MED ORDER — SODIUM CHLORIDE 0.9 % IV SOLN
1000.0000 mg | Freq: Once | INTRAVENOUS | Status: AC
  Administered 2016-12-19: 1000 mg via INTRAVENOUS
  Filled 2016-12-19: qty 40

## 2016-12-19 MED ORDER — ACETAMINOPHEN 325 MG PO TABS
650.0000 mg | ORAL_TABLET | Freq: Once | ORAL | Status: AC
Start: 2016-12-19 — End: 2016-12-19
  Administered 2016-12-19: 650 mg via ORAL
  Filled 2016-12-19: qty 2

## 2016-12-19 MED ORDER — SODIUM CHLORIDE 0.9 % IV SOLN
Freq: Once | INTRAVENOUS | Status: AC
  Administered 2016-12-19: 10:00:00 via INTRAVENOUS
  Filled 2016-12-19: qty 1000

## 2016-12-19 MED ORDER — SODIUM CHLORIDE 0.9 % IV SOLN
20.0000 mg | Freq: Once | INTRAVENOUS | Status: AC
  Administered 2016-12-19: 20 mg via INTRAVENOUS
  Filled 2016-12-19: qty 2

## 2016-12-19 MED ORDER — DIPHENHYDRAMINE HCL 50 MG/ML IJ SOLN
50.0000 mg | Freq: Once | INTRAMUSCULAR | Status: AC
  Administered 2016-12-19: 50 mg via INTRAVENOUS
  Filled 2016-12-19: qty 1

## 2016-12-19 MED ORDER — FAMOTIDINE IN NACL 20-0.9 MG/50ML-% IV SOLN
20.0000 mg | Freq: Once | INTRAVENOUS | Status: AC
  Administered 2016-12-19: 20 mg via INTRAVENOUS
  Filled 2016-12-19: qty 50

## 2017-01-10 ENCOUNTER — Other Ambulatory Visit: Payer: Self-pay | Admitting: Internal Medicine

## 2017-01-18 ENCOUNTER — Other Ambulatory Visit: Payer: Self-pay | Admitting: Internal Medicine

## 2017-01-18 MED ORDER — ACYCLOVIR 200 MG PO CAPS
200.0000 mg | ORAL_CAPSULE | Freq: Three times a day (TID) | ORAL | 0 refills | Status: DC
Start: 2017-01-18 — End: 2017-07-23

## 2017-01-18 NOTE — Addendum Note (Signed)
Addended by: Betti Cruz on: 01/18/2017 11:42 AM   Modules accepted: Orders

## 2017-01-23 ENCOUNTER — Inpatient Hospital Stay (HOSPITAL_BASED_OUTPATIENT_CLINIC_OR_DEPARTMENT_OTHER): Payer: PPO | Admitting: Internal Medicine

## 2017-01-23 ENCOUNTER — Inpatient Hospital Stay: Payer: PPO | Attending: Internal Medicine

## 2017-01-23 ENCOUNTER — Inpatient Hospital Stay: Payer: PPO

## 2017-01-23 VITALS — BP 145/82 | HR 60 | Temp 97.8°F | Resp 18 | Ht 67.0 in | Wt 134.8 lb

## 2017-01-23 DIAGNOSIS — Z79899 Other long term (current) drug therapy: Secondary | ICD-10-CM

## 2017-01-23 DIAGNOSIS — C9190 Lymphoid leukemia, unspecified not having achieved remission: Secondary | ICD-10-CM | POA: Insufficient documentation

## 2017-01-23 DIAGNOSIS — L299 Pruritus, unspecified: Secondary | ICD-10-CM | POA: Insufficient documentation

## 2017-01-23 DIAGNOSIS — Z5112 Encounter for antineoplastic immunotherapy: Secondary | ICD-10-CM | POA: Diagnosis not present

## 2017-01-23 DIAGNOSIS — C9110 Chronic lymphocytic leukemia of B-cell type not having achieved remission: Secondary | ICD-10-CM

## 2017-01-23 DIAGNOSIS — D649 Anemia, unspecified: Secondary | ICD-10-CM | POA: Diagnosis not present

## 2017-01-23 DIAGNOSIS — R21 Rash and other nonspecific skin eruption: Secondary | ICD-10-CM

## 2017-01-23 DIAGNOSIS — F17210 Nicotine dependence, cigarettes, uncomplicated: Secondary | ICD-10-CM | POA: Diagnosis not present

## 2017-01-23 DIAGNOSIS — E538 Deficiency of other specified B group vitamins: Secondary | ICD-10-CM

## 2017-01-23 LAB — COMPREHENSIVE METABOLIC PANEL
ALT: 14 U/L (ref 14–54)
AST: 19 U/L (ref 15–41)
Albumin: 4.3 g/dL (ref 3.5–5.0)
Alkaline Phosphatase: 49 U/L (ref 38–126)
Anion gap: 8 (ref 5–15)
BUN: 16 mg/dL (ref 6–20)
CHLORIDE: 105 mmol/L (ref 101–111)
CO2: 30 mmol/L (ref 22–32)
CREATININE: 0.59 mg/dL (ref 0.44–1.00)
Calcium: 9.1 mg/dL (ref 8.9–10.3)
GFR calc non Af Amer: >60 mL/min (ref >60)
Glucose, Bld: 99 mg/dL (ref 65–99)
POTASSIUM: 4.7 mmol/L (ref 3.5–5.1)
SODIUM: 143 mmol/L (ref 135–145)
Total Bilirubin: 0.5 mg/dL (ref 0.3–1.2)
Total Protein: 6.3 g/dL — ABNORMAL LOW (ref 6.5–8.1)

## 2017-01-23 LAB — CBC WITH DIFFERENTIAL/PLATELET
BASOS ABS: 0 10*3/uL (ref 0–0.1)
Basophils Relative: 0 %
EOS ABS: 0.2 10*3/uL (ref 0–0.7)
EOS PCT: 4 %
HCT: 39.3 % (ref 35.0–47.0)
Hemoglobin: 13.4 g/dL (ref 12.0–16.0)
LYMPHS ABS: 2.1 10*3/uL (ref 1.0–3.6)
Lymphocytes Relative: 44 %
MCH: 30.8 pg (ref 26.0–34.0)
MCHC: 34.2 g/dL (ref 32.0–36.0)
MCV: 90.3 fL (ref 80.0–100.0)
Monocytes Absolute: 0.3 10*3/uL (ref 0.2–0.9)
Monocytes Relative: 7 %
Neutro Abs: 2.2 10*3/uL (ref 1.4–6.5)
Neutrophils Relative %: 45 %
PLATELETS: 94 10*3/uL — AB (ref 150–440)
RBC: 4.35 MIL/uL (ref 3.80–5.20)
RDW: 14.1 % (ref 11.5–14.5)
WBC: 4.8 10*3/uL (ref 3.6–11.0)

## 2017-01-23 MED ORDER — SODIUM CHLORIDE 0.9 % IV SOLN
1000.0000 mg | Freq: Once | INTRAVENOUS | Status: AC
  Administered 2017-01-23: 1000 mg via INTRAVENOUS
  Filled 2017-01-23: qty 40

## 2017-01-23 MED ORDER — SODIUM CHLORIDE 0.9 % IV SOLN
20.0000 mg | Freq: Once | INTRAVENOUS | Status: AC
  Administered 2017-01-23: 20 mg via INTRAVENOUS
  Filled 2017-01-23: qty 2

## 2017-01-23 MED ORDER — SODIUM CHLORIDE 0.9 % IV SOLN
Freq: Once | INTRAVENOUS | Status: AC
  Administered 2017-01-23: 10:00:00 via INTRAVENOUS
  Filled 2017-01-23: qty 1000

## 2017-01-23 MED ORDER — DIPHENHYDRAMINE HCL 50 MG/ML IJ SOLN
50.0000 mg | Freq: Once | INTRAMUSCULAR | Status: AC
  Administered 2017-01-23: 50 mg via INTRAVENOUS

## 2017-01-23 MED ORDER — FAMOTIDINE IN NACL 20-0.9 MG/50ML-% IV SOLN
20.0000 mg | Freq: Once | INTRAVENOUS | Status: AC
  Administered 2017-01-23: 20 mg via INTRAVENOUS

## 2017-01-23 MED ORDER — ACETAMINOPHEN 325 MG PO TABS
650.0000 mg | ORAL_TABLET | Freq: Once | ORAL | Status: AC
  Administered 2017-01-23: 650 mg via ORAL

## 2017-01-23 NOTE — Assessment & Plan Note (Addendum)
#   CLL/  recurrent stage IV- elevated white count/lymphadenopathy/splenomegaly JAN 2018- FISH Positive for 13 Q del. Currently on Gazyva. Tolerating well. Significant clinical response noted   # Proceed with cycle # 6 day 1 today .Labs today reviewed;  acceptable for treatment today except platelets-94   # Skin rash- ? Allergies; claritin.   # Infectious prophylaxis with acyclovir/Bactrim; will STOP in September 2018.   # follow up in 2 months/labs.

## 2017-01-23 NOTE — Progress Notes (Signed)
[facility] OFFICE PROGRESS NOTE  Patient Care Team: Juline Patch, MD as PCP - General (Family Medicine) Earnestine Leys, MD (Specialist) Cammie Sickle, MD as Consulting Physician (Internal Medicine)  Cancer Staging No matching staging information was found for the patient.   Oncology History   # 2002- CLL [Dx- in [place] 2010- FCR x 4 cycles [Dr.Pandit]; Normal karyotype; PR; AUG 2012- Single agent Rituxan x7 [held sec to neutropenia];   # June 2015- Progression based on lymphocytosis- surveillance; Feb 2017- FISH- 13 q del/ IGVH MUTATED [good prognostic]  # Hx of skin rash [2012 s/p Bx-ant thigh lesion-vesicular spongiotic dermatitis]-resolved; B12 def [2012]     CLL (chronic lymphocytic leukemia) ([place])   02/28/2016 Initial Diagnosis    CLL (chronic lymphocytic leukemia) ([place])       INTERVAL HISTORY:  Maureen [name] 76 y.o.  female pleasant patient above history of CLL relapsed currently on [place] is here for follow-up. She is currently s/p cycle # 5 cycles.    Patient noted to have a skin rash which she attributes to weed in the garden. This mostly on her hands and legs. Improving/resolving She is gaining weight. Denies any new lumps or bumps.  Otherwise doing well. No fevers or chills.  REVIEW OF SYSTEMS:  A complete 10 point review of system is done which is negative except mentioned above/history of present illness.   PAST MEDICAL HISTORY :  Past Medical History:  Diagnosis Date  . Anemia   . CLL (chronic lymphocytic leukemia) ([place])   . CLL (chronic lymphocytic leukemia) ([place])   . Itching   . Vitamin B 12 deficiency     PAST SURGICAL HISTORY :   Past Surgical History:  Procedure Laterality Date  . breeast mass removed  1988   benign    FAMILY HISTORY :  No family history on file.  SOCIAL HISTORY:   Social History  Substance Use Topics  . Smoking status: Current Every Day Smoker    Packs/day: 0.50    Types: Cigarettes  .  Smokeless tobacco: Never Used  . Alcohol use Yes     Comment: occ.    ALLERGIES:  has No Known Allergies.  MEDICATIONS:  Current Outpatient Prescriptions  Medication Sig Dispense Refill  . acyclovir (ZOVIRAX) 200 MG capsule Take 1 capsule (200 mg total) by mouth 3 (three) times daily. 270 capsule 0  . Emollient (EUCERIN) lotion Apply topically as needed for dry skin. 240 mL 0  . ferrous sulfate 325 (65 FE) MG tablet Take 325 mg by mouth daily with breakfast.    . folic acid (FOLVITE) 158 MCG tablet Take 800 mcg by mouth daily.     Marland Kitchen loratadine (CLARITIN) 10 MG tablet Take 1 tablet (10 mg total) by mouth daily. (Patient taking differently: Take 10 mg by mouth daily. ) 30 tablet 11  . sulfamethoxazole-trimethoprim (BACTRIM DS,SEPTRA DS) 800-160 MG tablet TAKE ONE TABLET BY MOUTH ON MONDAY,WEDNESDAY AND FRIDAY 36 tablet 3  . betamethasone dipropionate (DIPROLENE) 0.05 % cream Apply topically 2 (two) times daily. (Patient not taking: Reported on 01/23/2017) 30 g 11   No current facility-administered medications for this visit.     PHYSICAL EXAMINATION: ECOG PERFORMANCE STATUS: 0 - Asymptomatic  BP (!) 145/82 (BP Location: Left Arm, Patient Position: Sitting)   Pulse 60   Temp 97.8 F (36.6 C) (Tympanic)   Resp 18   Ht 5\' 7"  (1.702 m)   Wt 134 lb 12.8 oz (61.1 kg)  BMI 21.11 kg/m   Filed Weights   01/23/17 0831  Weight: 134 lb 12.8 oz (61.1 kg)    GENERAL: Well-nourished well-developed; Alert, no distress and comfortable. She is alone. EYES: no pallor or icterus OROPHARYNX: no thrush or ulceration; good dentition  NECK: supple, no masses felt LYMPH:  No lymphadenopathy noted in  cervical, axillary or inguinal regions LUNGS: clear to auscultation and  No wheeze or crackles HEART/CVS: regular rate & rhythm and no murmurs; No lower extremity edema.  ABDOMEN:abdomen soft, non-tender and normal bowel sounds; splenomegaly improving. Musculoskeletal:no cyanosis of digits and no  clubbing  PSYCH: alert & oriented x 3 with fluent speech NEURO: no focal motor/sensory deficits SKIN:  Mild resolving rash on arm/legs.   LABORATORY DATA:  I have reviewed the data as listed    Component Value Date/Time   NA 143 01/23/2017 0800   NA 144 03/26/2013 0823   K 4.7 01/23/2017 0800   K 4.0 03/26/2013 0823   CL 105 01/23/2017 0800   CL 106 03/26/2013 0823   CO2 30 01/23/2017 0800   CO2 28 03/26/2013 0823   GLUCOSE 99 01/23/2017 0800   GLUCOSE 96 03/26/2013 0823   BUN 16 01/23/2017 0800   BUN 12 03/26/2013 0823   CREATININE 0.59 01/23/2017 0800   CREATININE 0.64 12/17/2013 0947   CALCIUM 9.1 01/23/2017 0800   CALCIUM 8.6 03/26/2013 0823   PROT 6.3 (L) 01/23/2017 0800   PROT 6.6 12/17/2013 0947   ALBUMIN 4.3 01/23/2017 0800   ALBUMIN 4.0 12/17/2013 0947   AST 19 01/23/2017 0800   AST 19 12/17/2013 0947   ALT 14 01/23/2017 0800   ALT 26 12/17/2013 0947   ALKPHOS 49 01/23/2017 0800   ALKPHOS 63 12/17/2013 0947   BILITOT 0.5 01/23/2017 0800   BILITOT 0.4 12/17/2013 0947   GFRNONAA >60 01/23/2017 0800   GFRNONAA >60 09/24/2013 0854   GFRAA >60 01/23/2017 0800   GFRAA >60 09/24/2013 0854    No results found for: SPEP, UPEP  Lab Results  Component Value Date   WBC 4.8 01/23/2017   NEUTROABS 2.2 01/23/2017   HGB 13.4 01/23/2017   HCT 39.3 01/23/2017   MCV 90.3 01/23/2017   PLT 94 (L) 01/23/2017      Chemistry      Component Value Date/Time   NA 143 01/23/2017 0800   NA 144 03/26/2013 0823   K 4.7 01/23/2017 0800   K 4.0 03/26/2013 0823   CL 105 01/23/2017 0800   CL 106 03/26/2013 0823   CO2 30 01/23/2017 0800   CO2 28 03/26/2013 0823   BUN 16 01/23/2017 0800   BUN 12 03/26/2013 0823   CREATININE 0.59 01/23/2017 0800   CREATININE 0.64 12/17/2013 0947      Component Value Date/Time   CALCIUM 9.1 01/23/2017 0800   CALCIUM 8.6 03/26/2013 0823   ALKPHOS 49 01/23/2017 0800   ALKPHOS 63 12/17/2013 0947   AST 19 01/23/2017 0800   AST 19 12/17/2013  0947   ALT 14 01/23/2017 0800   ALT 26 12/17/2013 0947   BILITOT 0.5 01/23/2017 0800   BILITOT 0.4 12/17/2013 0947       RADIOGRAPHIC STUDIES: I have personally reviewed the radiological images as listed and agreed with the findings in the report. No results found.   ASSESSMENT & PLAN:  CLL (chronic lymphocytic leukemia) ([facility]) # CLL/  recurrent stage IV- elevated white count/lymphadenopathy/splenomegaly JAN 2018- FISH Positive for 13 Q del. Currently on Gazyva. Tolerating well. Significant  clinical response noted   # Proceed with cycle # 6 day 1 today .Labs today reviewed;  acceptable for treatment today except platelets-94   # Skin rash- ? Allergies; claritin.   # Infectious prophylaxis with acyclovir/Bactrim; will STOP in September 2018.   # follow up in 2 months/labs.    Orders Placed This Encounter  Procedures  . CBC with Differential    Standing Status:   Future    Standing Expiration Date:   01/23/2018  . Comprehensive metabolic panel    Standing Status:   Future    Standing Expiration Date:   01/23/2018  . Lactate dehydrogenase    Standing Status:   Future    Standing Expiration Date:   01/23/2018   All questions were answered. The patient knows to call the clinic with any problems, questions or concerns.      Cammie Sickle, MD 01/23/2017 6:32 PM

## 2017-03-26 ENCOUNTER — Inpatient Hospital Stay (HOSPITAL_BASED_OUTPATIENT_CLINIC_OR_DEPARTMENT_OTHER): Payer: PPO | Admitting: Internal Medicine

## 2017-03-26 ENCOUNTER — Inpatient Hospital Stay: Payer: PPO | Attending: Internal Medicine

## 2017-03-26 VITALS — BP 144/77 | HR 68 | Temp 97.6°F | Resp 20 | Ht 67.0 in | Wt 139.6 lb

## 2017-03-26 DIAGNOSIS — L299 Pruritus, unspecified: Secondary | ICD-10-CM | POA: Diagnosis not present

## 2017-03-26 DIAGNOSIS — Z79899 Other long term (current) drug therapy: Secondary | ICD-10-CM | POA: Insufficient documentation

## 2017-03-26 DIAGNOSIS — D649 Anemia, unspecified: Secondary | ICD-10-CM | POA: Insufficient documentation

## 2017-03-26 DIAGNOSIS — C9110 Chronic lymphocytic leukemia of B-cell type not having achieved remission: Secondary | ICD-10-CM

## 2017-03-26 DIAGNOSIS — F17210 Nicotine dependence, cigarettes, uncomplicated: Secondary | ICD-10-CM

## 2017-03-26 DIAGNOSIS — D709 Neutropenia, unspecified: Secondary | ICD-10-CM

## 2017-03-26 DIAGNOSIS — E538 Deficiency of other specified B group vitamins: Secondary | ICD-10-CM | POA: Insufficient documentation

## 2017-03-26 DIAGNOSIS — C9190 Lymphoid leukemia, unspecified not having achieved remission: Secondary | ICD-10-CM

## 2017-03-26 LAB — LACTATE DEHYDROGENASE: LDH: 152 U/L (ref 98–192)

## 2017-03-26 LAB — COMPREHENSIVE METABOLIC PANEL
ALT: 17 U/L (ref 14–54)
ANION GAP: 8 (ref 5–15)
AST: 19 U/L (ref 15–41)
Albumin: 4.3 g/dL (ref 3.5–5.0)
Alkaline Phosphatase: 46 U/L (ref 38–126)
BUN: <5 mg/dL — ABNORMAL LOW (ref 6–20)
CO2: 29 mmol/L (ref 22–32)
CREATININE: 0.59 mg/dL (ref 0.44–1.00)
Calcium: 8.9 mg/dL (ref 8.9–10.3)
Chloride: 102 mmol/L (ref 101–111)
GFR calc non Af Amer: >60 mL/min (ref >60)
Glucose, Bld: 116 mg/dL — ABNORMAL HIGH (ref 65–99)
Potassium: 4 mmol/L (ref 3.5–5.1)
SODIUM: 139 mmol/L (ref 135–145)
Total Bilirubin: 0.7 mg/dL (ref 0.3–1.2)
Total Protein: 6.5 g/dL (ref 6.5–8.1)

## 2017-03-26 LAB — CBC WITH DIFFERENTIAL/PLATELET
Basophils Absolute: 0 10*3/uL (ref 0–0.1)
Basophils Relative: 0 %
EOS ABS: 0.1 10*3/uL (ref 0–0.7)
EOS PCT: 2 %
HCT: 41.5 % (ref 35.0–47.0)
Hemoglobin: 13.8 g/dL (ref 12.0–16.0)
LYMPHS ABS: 2.5 10*3/uL (ref 1.0–3.6)
Lymphocytes Relative: 42 %
MCH: 29.8 pg (ref 26.0–34.0)
MCHC: 33.1 g/dL (ref 32.0–36.0)
MCV: 90.1 fL (ref 80.0–100.0)
Monocytes Absolute: 0.3 10*3/uL (ref 0.2–0.9)
Monocytes Relative: 5 %
Neutro Abs: 3.1 10*3/uL (ref 1.4–6.5)
Neutrophils Relative %: 51 %
PLATELETS: 122 10*3/uL — AB (ref 150–440)
RBC: 4.61 MIL/uL (ref 3.80–5.20)
RDW: 13.3 % (ref 11.5–14.5)
WBC: 6.1 10*3/uL (ref 3.6–11.0)

## 2017-03-26 NOTE — Progress Notes (Signed)
[facility] OFFICE PROGRESS NOTE  Patient Care Team: Juline Patch, MD as PCP - General (Family Medicine) Earnestine Leys, MD (Specialist) Cammie Sickle, MD as Consulting Physician (Internal Medicine)  Cancer Staging CLL (chronic lymphocytic leukemia) ([place]) - No stage assigned - Unsigned    Oncology History   # 2002- CLL [Dx- in [place] 2010- FCR x 4 cycles [Dr.Pandit]; Normal karyotype; PR; AUG 2012- Single agent Rituxan x7 [held sec to neutropenia];   # June 2015- Progression based on lymphocytosis- surveillance; Feb 2017- FISH- 13 q del/ IGVH MUTATED [good prognostic] s/p Gazyva x6 cyles [6th cycle on [id number]]  # Hx of skin rash [2012 s/p Bx-ant thigh lesion-vesicular spongiotic dermatitis]-resolved; B12 def [2012]     CLL (chronic lymphocytic leukemia) ([place])    INTERVAL HISTORY:  Maureen [name] 76 y.o.  female pleasant patient above history of CLL relapsed currently on [place] is here for follow-up. She is currently s/p cycle # 6 cycles.   Patient states to have a itchy skin after being in the [place]. Otherwise no skin rash. She is gaining weight. Denies any new lumps or bumps.  Otherwise doing well. No fevers or chills.  REVIEW OF SYSTEMS:  A complete 10 point review of system is done which is negative except mentioned above/history of present illness.   PAST MEDICAL HISTORY :  Past Medical History:  Diagnosis Date  . Anemia   . CLL (chronic lymphocytic leukemia) ([place])   . CLL (chronic lymphocytic leukemia) ([place])   . Itching   . Vitamin B 12 deficiency     PAST SURGICAL HISTORY :   Past Surgical History:  Procedure Laterality Date  . breeast mass removed  1988   benign    FAMILY HISTORY :  No family history on file.  SOCIAL HISTORY:   Social History  Substance Use Topics  . Smoking status: Current Every Day Smoker    Packs/day: 0.50    Types: Cigarettes  . Smokeless tobacco: Never Used  . Alcohol use Yes   Comment: occ.    ALLERGIES:  has No Known Allergies.  MEDICATIONS:  Current Outpatient Prescriptions  Medication Sig Dispense Refill  . betamethasone dipropionate (DIPROLENE) 0.05 % cream Apply topically 2 (two) times daily. 30 g 11  . Emollient (EUCERIN) lotion Apply topically as needed for dry skin. 240 mL 0  . ferrous sulfate 325 (65 FE) MG tablet Take 325 mg by mouth daily with breakfast.    . folic acid (FOLVITE) 240 MCG tablet Take 800 mcg by mouth daily.     Marland Kitchen loratadine (CLARITIN) 10 MG tablet Take 1 tablet (10 mg total) by mouth daily. (Patient taking differently: Take 10 mg by mouth daily. ) 30 tablet 11  . acyclovir (ZOVIRAX) 200 MG capsule Take 1 capsule (200 mg total) by mouth 3 (three) times daily. (Patient not taking: Reported on 03/26/2017) 270 capsule 0  . sulfamethoxazole-trimethoprim (BACTRIM DS,SEPTRA DS) 800-160 MG tablet TAKE ONE TABLET BY MOUTH ON MONDAY,WEDNESDAY AND FRIDAY (Patient not taking: Reported on 03/26/2017) 36 tablet 3   No current facility-administered medications for this visit.     PHYSICAL EXAMINATION: ECOG PERFORMANCE STATUS: 0 - Asymptomatic  BP (!) 144/77 (Patient Position: Sitting)   Pulse 68   Temp 97.6 F (36.4 C) (Tympanic)   Resp 20   Ht 5\' 7"  (1.702 m)   Wt 139 lb 8.8 oz (63.3 kg)   BMI 21.86 kg/m   Filed Weights   03/26/17 1025  Weight: 139 lb 8.8 oz (63.3 kg)    GENERAL: Well-nourished well-developed; Alert, no distress and comfortable. She is alone. EYES: no pallor or icterus OROPHARYNX: no thrush or ulceration; good dentition  NECK: supple, no masses felt LYMPH:  No lymphadenopathy noted in  cervical, axillary or inguinal regions LUNGS: clear to auscultation and  No wheeze or crackles HEART/CVS: regular rate & rhythm and no murmurs; No lower extremity edema.  ABDOMEN:abdomen soft, non-tender and normal bowel sounds; splenomegaly improving. Musculoskeletal:no cyanosis of digits and no clubbing  PSYCH: alert & oriented x 3  with fluent speech NEURO: no focal motor/sensory deficits SKIN:  Mild resolving rash on arm/legs.   LABORATORY DATA:  I have reviewed the data as listed    Component Value Date/Time   NA 139 03/26/2017 0952   NA 144 03/26/2013 0823   K 4.0 03/26/2017 0952   K 4.0 03/26/2013 0823   CL 102 03/26/2017 0952   CL 106 03/26/2013 0823   CO2 29 03/26/2017 0952   CO2 28 03/26/2013 0823   GLUCOSE 116 (H) 03/26/2017 0952   GLUCOSE 96 03/26/2013 0823   BUN <5 (L) 03/26/2017 0952   BUN 12 03/26/2013 0823   CREATININE 0.59 03/26/2017 0952   CREATININE 0.64 12/17/2013 0947   CALCIUM 8.9 03/26/2017 0952   CALCIUM 8.6 03/26/2013 0823   PROT 6.5 03/26/2017 0952   PROT 6.6 12/17/2013 0947   ALBUMIN 4.3 03/26/2017 0952   ALBUMIN 4.0 12/17/2013 0947   AST 19 03/26/2017 0952   AST 19 12/17/2013 0947   ALT 17 03/26/2017 0952   ALT 26 12/17/2013 0947   ALKPHOS 46 03/26/2017 0952   ALKPHOS 63 12/17/2013 0947   BILITOT 0.7 03/26/2017 0952   BILITOT 0.4 12/17/2013 0947   GFRNONAA >60 03/26/2017 0952   GFRNONAA >60 09/24/2013 0854   GFRAA >60 03/26/2017 0952   GFRAA >60 09/24/2013 0854    No results found for: SPEP, UPEP  Lab Results  Component Value Date   WBC 6.1 03/26/2017   NEUTROABS 3.1 03/26/2017   HGB 13.8 03/26/2017   HCT 41.5 03/26/2017   MCV 90.1 03/26/2017   PLT 122 (L) 03/26/2017      Chemistry      Component Value Date/Time   NA 139 03/26/2017 0952   NA 144 03/26/2013 0823   K 4.0 03/26/2017 0952   K 4.0 03/26/2013 0823   CL 102 03/26/2017 0952   CL 106 03/26/2013 0823   CO2 29 03/26/2017 0952   CO2 28 03/26/2013 0823   BUN <5 (L) 03/26/2017 0952   BUN 12 03/26/2013 0823   CREATININE 0.59 03/26/2017 0952   CREATININE 0.64 12/17/2013 0947      Component Value Date/Time   CALCIUM 8.9 03/26/2017 0952   CALCIUM 8.6 03/26/2013 0823   ALKPHOS 46 03/26/2017 0952   ALKPHOS 63 12/17/2013 0947   AST 19 03/26/2017 0952   AST 19 12/17/2013 0947   ALT 17 03/26/2017  0952   ALT 26 12/17/2013 0947   BILITOT 0.7 03/26/2017 0952   BILITOT 0.4 12/17/2013 0947       RADIOGRAPHIC STUDIES: I have personally reviewed the radiological images as listed and agreed with the findings in the report. No results found.   ASSESSMENT & PLAN:  CLL (chronic lymphocytic leukemia) ([place]) # CLL/  recurrent stage IV- elevated white count/lymphadenopathy/splenomegaly JAN 2018- FISH Positive for 13 Q del. S/p Gazyva #6 cycles [last treatment 01/23/2017]  # Significant clinical response noted. CBC normal hemoglobin  and normal platelets slightly low at 122. Significant improvement of the peripheral lymphadenopathy/splenomegaly.  # Status post infectious prophylaxis with Bactrim/acyclovir.   # follow up in 4 months/labs or sooner if needed.    Orders Placed This Encounter  Procedures  . CBC with Differential/Platelet    Standing Status:   Future    Standing Expiration Date:   03/26/2018  . Comprehensive metabolic panel    Standing Status:   Future    Standing Expiration Date:   03/26/2018  . Lactate dehydrogenase    Standing Status:   Future    Standing Expiration Date:   03/26/2018   All questions were answered. The patient knows to call the clinic with any problems, questions or concerns.      Cammie Sickle, MD 03/26/2017 12:50 PM

## 2017-03-26 NOTE — Progress Notes (Signed)
Patient here for follow-up for CLL.

## 2017-03-26 NOTE — Assessment & Plan Note (Addendum)
#   CLL/  recurrent stage IV- elevated white count/lymphadenopathy/splenomegaly JAN 2018- FISH Positive for 13 Q del. S/p Gazyva #6 cycles [last treatment 01/23/2017]  # Significant clinical response noted. CBC normal hemoglobin and normal platelets slightly low at 122. Significant improvement of the peripheral lymphadenopathy/splenomegaly.  # Status post infectious prophylaxis with Bactrim/acyclovir.   # follow up in 4 months/labs or sooner if needed.

## 2017-03-27 ENCOUNTER — Other Ambulatory Visit: Payer: PPO

## 2017-03-27 ENCOUNTER — Ambulatory Visit: Payer: PPO | Admitting: Internal Medicine

## 2017-04-02 ENCOUNTER — Other Ambulatory Visit: Payer: Self-pay

## 2017-07-23 ENCOUNTER — Other Ambulatory Visit: Payer: Self-pay

## 2017-07-23 ENCOUNTER — Inpatient Hospital Stay: Payer: PPO | Attending: Internal Medicine | Admitting: Internal Medicine

## 2017-07-23 ENCOUNTER — Encounter: Payer: Self-pay | Admitting: Internal Medicine

## 2017-07-23 ENCOUNTER — Inpatient Hospital Stay: Payer: PPO

## 2017-07-23 VITALS — BP 160/83 | HR 75 | Temp 97.2°F | Resp 20 | Ht 67.0 in | Wt 132.0 lb

## 2017-07-23 DIAGNOSIS — F17210 Nicotine dependence, cigarettes, uncomplicated: Secondary | ICD-10-CM | POA: Diagnosis not present

## 2017-07-23 DIAGNOSIS — E538 Deficiency of other specified B group vitamins: Secondary | ICD-10-CM

## 2017-07-23 DIAGNOSIS — Z79899 Other long term (current) drug therapy: Secondary | ICD-10-CM | POA: Insufficient documentation

## 2017-07-23 DIAGNOSIS — C9110 Chronic lymphocytic leukemia of B-cell type not having achieved remission: Secondary | ICD-10-CM

## 2017-07-23 DIAGNOSIS — Z9221 Personal history of antineoplastic chemotherapy: Secondary | ICD-10-CM | POA: Insufficient documentation

## 2017-07-23 DIAGNOSIS — C9190 Lymphoid leukemia, unspecified not having achieved remission: Secondary | ICD-10-CM | POA: Diagnosis not present

## 2017-07-23 DIAGNOSIS — D649 Anemia, unspecified: Secondary | ICD-10-CM | POA: Insufficient documentation

## 2017-07-23 DIAGNOSIS — L299 Pruritus, unspecified: Secondary | ICD-10-CM | POA: Insufficient documentation

## 2017-07-23 LAB — COMPREHENSIVE METABOLIC PANEL
ALT: 12 U/L — AB (ref 14–54)
AST: 18 U/L (ref 15–41)
Albumin: 4.6 g/dL (ref 3.5–5.0)
Alkaline Phosphatase: 55 U/L (ref 38–126)
Anion gap: 8 (ref 5–15)
BUN: 11 mg/dL (ref 6–20)
CHLORIDE: 104 mmol/L (ref 101–111)
CO2: 28 mmol/L (ref 22–32)
CREATININE: 0.58 mg/dL (ref 0.44–1.00)
Calcium: 8.9 mg/dL (ref 8.9–10.3)
Glucose, Bld: 98 mg/dL (ref 65–99)
Potassium: 4.2 mmol/L (ref 3.5–5.1)
Sodium: 140 mmol/L (ref 135–145)
Total Bilirubin: 0.5 mg/dL (ref 0.3–1.2)
Total Protein: 6.9 g/dL (ref 6.5–8.1)

## 2017-07-23 LAB — CBC WITH DIFFERENTIAL/PLATELET
Basophils Absolute: 0 10*3/uL (ref 0–0.1)
Basophils Relative: 1 %
EOS ABS: 0.1 10*3/uL (ref 0–0.7)
Eosinophils Relative: 1 %
HCT: 43.4 % (ref 35.0–47.0)
HEMOGLOBIN: 14.1 g/dL (ref 12.0–16.0)
Lymphocytes Relative: 47 %
Lymphs Abs: 2.7 10*3/uL (ref 1.0–3.6)
MCH: 28.9 pg (ref 26.0–34.0)
MCHC: 32.5 g/dL (ref 32.0–36.0)
MCV: 88.9 fL (ref 80.0–100.0)
Monocytes Absolute: 0.3 10*3/uL (ref 0.2–0.9)
Monocytes Relative: 5 %
NEUTROS PCT: 46 %
Neutro Abs: 2.7 10*3/uL (ref 1.4–6.5)
Platelets: 137 10*3/uL — ABNORMAL LOW (ref 150–440)
RBC: 4.88 MIL/uL (ref 3.80–5.20)
RDW: 14.3 % (ref 11.5–14.5)
WBC: 5.9 10*3/uL (ref 3.6–11.0)

## 2017-07-23 LAB — LACTATE DEHYDROGENASE: LDH: 144 U/L (ref 98–192)

## 2017-07-23 NOTE — Addendum Note (Signed)
Addended by: Sabino Gasser on: 07/23/2017 10:32 AM   Modules accepted: Orders

## 2017-07-23 NOTE — Progress Notes (Signed)
[facility] OFFICE PROGRESS NOTE  Patient Care Team: Juline Patch, MD as PCP - General (Family Medicine) Earnestine Leys, MD (Specialist) Cammie Sickle, MD as Consulting Physician (Internal Medicine)  Cancer Staging No matching staging information was found for the patient.    Oncology History   # 2002- CLL [Dx- in [place] 2010- FCR x 4 cycles [Dr.Pandit]; Normal karyotype; PR; AUG 2012- Single agent Rituxan x7 [held sec to neutropenia];   # June 2015- Progression based on lymphocytosis- surveillance; Feb 2017- FISH- 13 q del/ IGVH MUTATED [good prognostic] s/p Gazyva x6 cyles [6th cycle on [id number]]  # Hx of skin rash [2012 s/p Bx-ant thigh lesion-vesicular spongiotic dermatitis]-resolved; B12 def [2012]     CLL (chronic lymphocytic leukemia) ([place])    INTERVAL HISTORY:  Maureen [name] 77 y.o.  female pleasant patient above history of CLL relapsed currently on [place] is here for follow-up. She is currently s/p cycle # 6 cycles in July 2018.    She is gaining weight. Denies any new lumps or bumps.  Otherwise doing well. No fevers or chills.  Appetite is good.  No weight loss.   REVIEW OF SYSTEMS:  A complete 10 point review of system is done which is negative except mentioned above/history of present illness.   PAST MEDICAL HISTORY :  Past Medical History:  Diagnosis Date  . Anemia   . CLL (chronic lymphocytic leukemia) ([place])   . CLL (chronic lymphocytic leukemia) ([place])   . Itching   . Vitamin B 12 deficiency     PAST SURGICAL HISTORY :   Past Surgical History:  Procedure Laterality Date  . breeast mass removed  1988   benign    FAMILY HISTORY :  History reviewed. No pertinent family history.  SOCIAL HISTORY:   Social History   Tobacco Use  . Smoking status: Current Every Day Smoker    Packs/day: 0.50    Types: Cigarettes  . Smokeless tobacco: Never Used  Substance Use Topics  . Alcohol use: Yes    Comment: occ.  . Drug use:  No    ALLERGIES:  has No Known Allergies.  MEDICATIONS:  Current Outpatient Medications  Medication Sig Dispense Refill  . ferrous sulfate 325 (65 FE) MG tablet Take 325 mg by mouth daily with breakfast.    . folic acid (FOLVITE) 132 MCG tablet Take 800 mcg by mouth daily.      No current facility-administered medications for this visit.     PHYSICAL EXAMINATION: ECOG PERFORMANCE STATUS: 0 - Asymptomatic  BP (!) 160/83 (BP Location: Left Arm, Patient Position: Sitting)   Pulse 75   Temp (!) 97.2 F (36.2 C) (Tympanic)   Resp 20   Ht 5\' 7"  (1.702 m)   Wt 132 lb (59.9 kg)   BMI 20.67 kg/m   Filed Weights   07/23/17 0954  Weight: 132 lb (59.9 kg)    GENERAL: Well-nourished well-developed; Alert, no distress and comfortable. She is alone. EYES: no pallor or icterus OROPHARYNX: no thrush or ulceration; good dentition  NECK: supple, no masses felt LYMPH:  No lymphadenopathy noted in  cervical, axillary or inguinal regions LUNGS: clear to auscultation and  No wheeze or crackles HEART/CVS: regular rate & rhythm and no murmurs; No lower extremity edema.  ABDOMEN:abdomen soft, non-tender and normal bowel sounds; splenomegaly improving. Musculoskeletal:no cyanosis of digits and no clubbing  PSYCH: alert & oriented x 3 with fluent speech NEURO: no focal motor/sensory deficits SKIN:  Mild resolving  rash on arm/legs.   LABORATORY DATA:  I have reviewed the data as listed    Component Value Date/Time   NA 140 07/23/2017 0925   NA 144 03/26/2013 0823   K 4.2 07/23/2017 0925   K 4.0 03/26/2013 0823   CL 104 07/23/2017 0925   CL 106 03/26/2013 0823   CO2 28 07/23/2017 0925   CO2 28 03/26/2013 0823   GLUCOSE 98 07/23/2017 0925   GLUCOSE 96 03/26/2013 0823   BUN 11 07/23/2017 0925   BUN 12 03/26/2013 0823   CREATININE 0.58 07/23/2017 0925   CREATININE 0.64 12/17/2013 0947   CALCIUM 8.9 07/23/2017 0925   CALCIUM 8.6 03/26/2013 0823   PROT 6.9 07/23/2017 0925   PROT 6.6  12/17/2013 0947   ALBUMIN 4.6 07/23/2017 0925   ALBUMIN 4.0 12/17/2013 0947   AST 18 07/23/2017 0925   AST 19 12/17/2013 0947   ALT 12 (L) 07/23/2017 0925   ALT 26 12/17/2013 0947   ALKPHOS 55 07/23/2017 0925   ALKPHOS 63 12/17/2013 0947   BILITOT 0.5 07/23/2017 0925   BILITOT 0.4 12/17/2013 0947   GFRNONAA >60 07/23/2017 0925   GFRNONAA >60 09/24/2013 0854   GFRAA >60 07/23/2017 0925   GFRAA >60 09/24/2013 0854    No results found for: SPEP, UPEP  Lab Results  Component Value Date   WBC 5.9 07/23/2017   NEUTROABS 2.7 07/23/2017   HGB 14.1 07/23/2017   HCT 43.4 07/23/2017   MCV 88.9 07/23/2017   PLT 137 (L) 07/23/2017      Chemistry      Component Value Date/Time   NA 140 07/23/2017 0925   NA 144 03/26/2013 0823   K 4.2 07/23/2017 0925   K 4.0 03/26/2013 0823   CL 104 07/23/2017 0925   CL 106 03/26/2013 0823   CO2 28 07/23/2017 0925   CO2 28 03/26/2013 0823   BUN 11 07/23/2017 0925   BUN 12 03/26/2013 0823   CREATININE 0.58 07/23/2017 0925   CREATININE 0.64 12/17/2013 0947      Component Value Date/Time   CALCIUM 8.9 07/23/2017 0925   CALCIUM 8.6 03/26/2013 0823   ALKPHOS 55 07/23/2017 0925   ALKPHOS 63 12/17/2013 0947   AST 18 07/23/2017 0925   AST 19 12/17/2013 0947   ALT 12 (L) 07/23/2017 0925   ALT 26 12/17/2013 0947   BILITOT 0.5 07/23/2017 0925   BILITOT 0.4 12/17/2013 0947       RADIOGRAPHIC STUDIES: I have personally reviewed the radiological images as listed and agreed with the findings in the report. No results found.   ASSESSMENT & PLAN:  CLL (chronic lymphocytic leukemia) ([place]) # CLL/  recurrent stage IV- elevated white count/lymphadenopathy/splenomegaly JAN 2018- FISH Positive for 13 Q del. S/p Gazyva #6 cycles [last treatment 01/23/2017]  # Significant clinical response noted. CBC normal hemoglobin and normal platelets slightly low at 137. Significant improvement of the peripheral lymphadenopathy/splenomegaly.  # elevated Blood  pressure- 160s; not on meds; recommend keeping a log of pressures.  Bring to attention of PCP/or call us.   # follow up in 3 months/labs or sooner if needed. CT scan prior.    Orders Placed This Encounter  Procedures  . CT Chest W Contrast    Standing Status:   Future    Standing Expiration Date:   07/23/2018    Order Specific Question:   If indicated for the ordered procedure, I authorize the administration of contrast media per Radiology protocol  Answer:   Yes    Order Specific Question:   Preferred imaging location?    Answer:   [facility]    Order Specific Question:   Radiology Contrast Protocol - do NOT remove file path    Answer:   [email or web address]  . CT Abdomen Pelvis W Contrast    Standing Status:   Future    Standing Expiration Date:   07/23/2018    Order Specific Question:   If indicated for the ordered procedure, I authorize the administration of contrast media per Radiology protocol    Answer:   Yes    Order Specific Question:   Preferred imaging location?    Answer:   [facility]    Order Specific Question:   Radiology Contrast Protocol - do NOT remove file path    Answer:   [email or web address]   All questions were answered. The patient knows to call the clinic with any problems, questions or concerns.      Cammie Sickle, MD 07/23/2017 10:30 AM

## 2017-07-23 NOTE — Assessment & Plan Note (Addendum)
#   CLL/  recurrent stage IV- elevated white count/lymphadenopathy/splenomegaly JAN 2018- FISH Positive for 13 Q del. S/p Gazyva #6 cycles [last treatment 01/23/2017]  # Significant clinical response noted. CBC normal hemoglobin and normal platelets slightly low at 137. Significant improvement of the peripheral lymphadenopathy/splenomegaly.  # elevated Blood pressure- 160s; not on meds; recommend keeping a log of pressures.  Bring to attention of PCP/or call us.   # follow up in 3 months/labs or sooner if needed. CT scan prior.

## 2017-07-24 DIAGNOSIS — F17210 Nicotine dependence, cigarettes, uncomplicated: Secondary | ICD-10-CM | POA: Diagnosis not present

## 2017-07-24 DIAGNOSIS — I6522 Occlusion and stenosis of left carotid artery: Secondary | ICD-10-CM | POA: Diagnosis not present

## 2017-07-24 DIAGNOSIS — M47812 Spondylosis without myelopathy or radiculopathy, cervical region: Secondary | ICD-10-CM | POA: Diagnosis not present

## 2017-07-24 DIAGNOSIS — S60222A Contusion of left hand, initial encounter: Secondary | ICD-10-CM | POA: Diagnosis not present

## 2017-07-24 DIAGNOSIS — S0093XA Contusion of unspecified part of head, initial encounter: Secondary | ICD-10-CM | POA: Diagnosis not present

## 2017-07-24 DIAGNOSIS — M549 Dorsalgia, unspecified: Secondary | ICD-10-CM | POA: Diagnosis not present

## 2017-07-24 DIAGNOSIS — M542 Cervicalgia: Secondary | ICD-10-CM | POA: Diagnosis not present

## 2017-07-24 DIAGNOSIS — R42 Dizziness and giddiness: Secondary | ICD-10-CM | POA: Diagnosis not present

## 2017-07-24 DIAGNOSIS — M546 Pain in thoracic spine: Secondary | ICD-10-CM | POA: Diagnosis not present

## 2017-07-24 DIAGNOSIS — M8580 Other specified disorders of bone density and structure, unspecified site: Secondary | ICD-10-CM | POA: Diagnosis not present

## 2017-07-24 DIAGNOSIS — S0083XA Contusion of other part of head, initial encounter: Secondary | ICD-10-CM | POA: Diagnosis not present

## 2017-07-24 DIAGNOSIS — S0990XA Unspecified injury of head, initial encounter: Secondary | ICD-10-CM | POA: Diagnosis not present

## 2017-07-24 DIAGNOSIS — M47892 Other spondylosis, cervical region: Secondary | ICD-10-CM | POA: Diagnosis not present

## 2017-07-24 DIAGNOSIS — S299XXA Unspecified injury of thorax, initial encounter: Secondary | ICD-10-CM | POA: Diagnosis not present

## 2017-07-30 ENCOUNTER — Ambulatory Visit (INDEPENDENT_AMBULATORY_CARE_PROVIDER_SITE_OTHER): Payer: PPO | Admitting: Family Medicine

## 2017-07-30 ENCOUNTER — Encounter: Payer: Self-pay | Admitting: Family Medicine

## 2017-07-30 DIAGNOSIS — M545 Low back pain: Secondary | ICD-10-CM | POA: Diagnosis not present

## 2017-07-30 DIAGNOSIS — M546 Pain in thoracic spine: Secondary | ICD-10-CM

## 2017-07-30 DIAGNOSIS — T07XXXA Unspecified multiple injuries, initial encounter: Secondary | ICD-10-CM

## 2017-07-30 DIAGNOSIS — S161XXD Strain of muscle, fascia and tendon at neck level, subsequent encounter: Secondary | ICD-10-CM

## 2017-07-30 DIAGNOSIS — M940 Chondrocostal junction syndrome [Tietze]: Secondary | ICD-10-CM

## 2017-07-30 DIAGNOSIS — S0083XS Contusion of other part of head, sequela: Secondary | ICD-10-CM
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
## 2017-08-08 ENCOUNTER — Encounter: Payer: Self-pay | Admitting: Family Medicine

## 2017-08-08 ENCOUNTER — Ambulatory Visit (INDEPENDENT_AMBULATORY_CARE_PROVIDER_SITE_OTHER): Payer: PPO | Admitting: Family Medicine

## 2017-08-08 VITALS — BP 120/82 | HR 76 | Ht 67.0 in | Wt 132.0 lb

## 2017-08-08 DIAGNOSIS — D696 Thrombocytopenia, unspecified: Secondary | ICD-10-CM

## 2017-08-08 DIAGNOSIS — C9190 Lymphoid leukemia, unspecified not having achieved remission: Secondary | ICD-10-CM | POA: Diagnosis not present

## 2017-08-08 DIAGNOSIS — C9110 Chronic lymphocytic leukemia of B-cell type not having achieved remission: Secondary | ICD-10-CM

## 2017-08-08 DIAGNOSIS — S8011XA Contusion of right lower leg, initial encounter: Secondary | ICD-10-CM | POA: Diagnosis not present

## 2017-08-08 NOTE — Progress Notes (Signed)
Name: Maureen [name]   MRN: [id number]    DOB: 1940/11/09   Date:08/08/2017       Progress Note  Subjective  Chief Complaint  Chief Complaint  Patient presents with  . Mass    felt a knot in R) lower leg when drying off from bath. Hurts when you press on it, but doesn't appear to be red    Patient noted a Knot of the right calf.   Other  This is a new (tender knot right lower leg) problem. The current episode started yesterday. The problem has been unchanged. Pertinent negatives include no abdominal pain, chest pain, chills, coughing, fever, headaches, myalgias, nausea, neck pain, rash or sore throat.    No problem-specific Assessment & Plan notes found for this encounter.   Past Medical History:  Diagnosis Date  . Anemia   . CLL (chronic lymphocytic leukemia) ([place])   . CLL (chronic lymphocytic leukemia) ([place])   . Itching   . Vitamin B 12 deficiency     Past Surgical History:  Procedure Laterality Date  . breeast mass removed  1988   benign    History reviewed. No pertinent family history.  Social History   Socioeconomic History  . Marital status: Divorced    Spouse name: Not on file  . Number of children: Not on file  . Years of education: Not on file  . Highest education level: Not on file  Social Needs  . Financial resource strain: Not on file  . Food insecurity - worry: Not on file  . Food insecurity - inability: Not on file  . Transportation needs - medical: Not on file  . Transportation needs - non-medical: Not on file  Occupational History  . Not on file  Tobacco Use  . Smoking status: Current Every Day Smoker    Packs/day: 0.50    Types: Cigarettes  . Smokeless tobacco: Never Used  Substance and Sexual Activity  . Alcohol use: Yes    Comment: occ.  . Drug use: No  . Sexual activity: Not Currently  Other Topics Concern  . Not on file  Social History Narrative  . Not on file    No Known Allergies  Outpatient Medications Prior to Visit    Medication Sig Dispense Refill  . ferrous sulfate 325 (65 FE) MG tablet Take 325 mg by mouth daily with breakfast.    . folic acid (FOLVITE) 536 MCG tablet Take 800 mcg by mouth daily.      No facility-administered medications prior to visit.     Review of Systems  Constitutional: Negative for chills, fever, malaise/fatigue and weight loss.  HENT: Negative for ear discharge, ear pain and sore throat.   Eyes: Negative for blurred vision.  Respiratory: Negative for cough, sputum production, shortness of breath and wheezing.   Cardiovascular: Negative for chest pain, palpitations and leg swelling.  Gastrointestinal: Negative for abdominal pain, blood in stool, constipation, diarrhea, heartburn, melena and nausea.  Genitourinary: Negative for dysuria, frequency, hematuria and urgency.  Musculoskeletal: Negative for back pain, joint pain, myalgias and neck pain.  Skin: Negative for rash.  Neurological: Negative for dizziness, tingling, sensory change, focal weakness and headaches.  Endo/Heme/Allergies: Negative for environmental allergies and polydipsia. Does not bruise/bleed easily.  Psychiatric/Behavioral: Negative for depression and suicidal ideas. The patient is not nervous/anxious and does not have insomnia.      Objective  Vitals:   08/08/17 0959  BP: 120/82  Pulse: 76  Weight: 132 lb (59.9 kg)  Height: 5\' 7"  (1.702 m)    Physical Exam  Constitutional: She is well-developed, well-nourished, and in no distress. No distress.  HENT:  Head: Normocephalic and atraumatic.  Right Ear: External ear normal.  Left Ear: External ear normal.  Nose: Nose normal.  Mouth/Throat: Oropharynx is clear and moist.  Eyes: Conjunctivae and EOM are normal. Pupils are equal, round, and reactive to light. Right eye exhibits no discharge. Left eye exhibits no discharge.  Neck: Normal range of motion. Neck supple. No JVD present. No thyromegaly present.  Cardiovascular: Normal rate, regular  rhythm, normal heart sounds and intact distal pulses. Exam reveals no gallop and no friction rub.  No murmur heard. Pulmonary/Chest: Effort normal and breath sounds normal. She has no wheezes. She has no rales.  Abdominal: Soft. Bowel sounds are normal. She exhibits no mass. There is no tenderness. There is no guarding.  Musculoskeletal: Normal range of motion. She exhibits no edema.  Lymphadenopathy:    She has no cervical adenopathy.  Neurological: She is alert. She has normal reflexes.  Skin: Skin is warm and dry. Ecchymosis noted. She is not diaphoretic.     Palpable area right medial calf  Psychiatric: Mood and affect normal.  Nursing note and vitals reviewed.     Assessment & Plan  Problem List Items Addressed This Visit      Other   CLL (chronic lymphocytic leukemia) ([place])    Other Visit Diagnoses    Contusion of calf, right, initial encounter    -  Primary   Thrombocytopenia ([place])       Relevant Orders   Platelet count      No orders of the defined types were placed in this encounter.     Dr. [facility]  08/08/17

## 2017-08-09 LAB — PLATELET COUNT: PLATELETS: 175 10*3/uL (ref 150–379)

## 2017-08-20 ENCOUNTER — Encounter: Payer: Self-pay | Admitting: Family Medicine

## 2017-08-20 ENCOUNTER — Ambulatory Visit (INDEPENDENT_AMBULATORY_CARE_PROVIDER_SITE_OTHER): Payer: PPO | Admitting: Family Medicine

## 2017-08-20 VITALS — BP 120/84 | HR 80 | Ht 67.0 in | Wt 127.0 lb

## 2017-08-20 DIAGNOSIS — I8002 Phlebitis and thrombophlebitis of superficial vessels of left lower extremity: Secondary | ICD-10-CM

## 2017-08-20 DIAGNOSIS — F17210 Nicotine dependence, cigarettes, uncomplicated: Secondary | ICD-10-CM

## 2017-08-20 DIAGNOSIS — M1990 Unspecified osteoarthritis, unspecified site: Secondary | ICD-10-CM

## 2017-08-20 DIAGNOSIS — Z780 Asymptomatic menopausal state: Secondary | ICD-10-CM | POA: Diagnosis not present

## 2017-08-20 NOTE — Progress Notes (Signed)
Name: Maureen [name]   MRN: [id number]    DOB: 24-Jul-1940   Date:08/20/2017       Progress Note  Subjective  Chief Complaint  Chief Complaint  Patient presents with  . Follow-up    3 week follow up contusion of R) calf- feeling a little better, still sore through shoulders. knot on back of R) leg is still there    Leg Pain   The incident occurred more than 1 week ago. Incident location: AA. Injury mechanism: aa. The pain is present in the left leg. The quality of the pain is described as aching. The pain is at a severity of 3/10. The pain is mild. The pain has been improving since onset. Pertinent negatives include no inability to bear weight, loss of motion, loss of sensation, muscle weakness, numbness or tingling. Nothing aggravates the symptoms. She has tried acetaminophen for the symptoms. The treatment provided mild relief.    No problem-specific Assessment & Plan notes found for this encounter.   Past Medical History:  Diagnosis Date  . Anemia   . CLL (chronic lymphocytic leukemia) ([place])   . CLL (chronic lymphocytic leukemia) ([place])   . Itching   . Vitamin B 12 deficiency     Past Surgical History:  Procedure Laterality Date  . breeast mass removed  1988   benign    History reviewed. No pertinent family history.  Social History   Socioeconomic History  . Marital status: Divorced    Spouse name: Not on file  . Number of children: Not on file  . Years of education: Not on file  . Highest education level: Not on file  Social Needs  . Financial resource strain: Not on file  . Food insecurity - worry: Not on file  . Food insecurity - inability: Not on file  . Transportation needs - medical: Not on file  . Transportation needs - non-medical: Not on file  Occupational History  . Not on file  Tobacco Use  . Smoking status: Current Every Day Smoker    Packs/day: 0.50    Types: Cigarettes  . Smokeless tobacco: Never Used  Substance and Sexual Activity  . Alcohol  use: Yes    Comment: occ.  . Drug use: No  . Sexual activity: Not Currently  Other Topics Concern  . Not on file  Social History Narrative  . Not on file    No Known Allergies  Outpatient Medications Prior to Visit  Medication Sig Dispense Refill  . ferrous sulfate 325 (65 FE) MG tablet Take 325 mg by mouth daily with breakfast.    . folic acid (FOLVITE) 353 MCG tablet Take 800 mcg by mouth daily.      No facility-administered medications prior to visit.     Review of Systems  Constitutional: Negative for chills, fever, malaise/fatigue and weight loss.  HENT: Negative for ear discharge, ear pain and sore throat.   Eyes: Negative for blurred vision.  Respiratory: Negative for cough, sputum production, shortness of breath and wheezing.   Cardiovascular: Negative for chest pain, palpitations and leg swelling.  Gastrointestinal: Negative for abdominal pain, blood in stool, constipation, diarrhea, heartburn, melena and nausea.  Genitourinary: Negative for dysuria, frequency, hematuria and urgency.  Musculoskeletal: Negative for back pain, joint pain, myalgias and neck pain.  Skin: Negative for rash.  Neurological: Negative for dizziness, tingling, sensory change, focal weakness, numbness and headaches.  Endo/Heme/Allergies: Negative for environmental allergies and polydipsia. Does not bruise/bleed easily.  Psychiatric/Behavioral: Negative for  depression and suicidal ideas. The patient is not nervous/anxious and does not have insomnia.      Objective  Vitals:   08/20/17 0937  BP: 120/84  Pulse: 80  Weight: 127 lb (57.6 kg)  Height: 5\' 7"  (1.702 m)    Physical Exam  Constitutional: She is well-developed, well-nourished, and in no distress. No distress.  HENT:  Head: Normocephalic and atraumatic.  Right Ear: External ear normal.  Left Ear: External ear normal.  Nose: Nose normal.  Mouth/Throat: Oropharynx is clear and moist.  Eyes: Conjunctivae and EOM are normal. Pupils  are equal, round, and reactive to light. Right eye exhibits no discharge. Left eye exhibits no discharge.  Neck: Normal range of motion. Neck supple. No JVD present. No thyromegaly present.  Cardiovascular: Normal rate, regular rhythm, normal heart sounds and intact distal pulses. Exam reveals no gallop and no friction rub.  No murmur heard. Pulmonary/Chest: Effort normal and breath sounds normal. She has no wheezes. She has no rales.  Abdominal: Soft. Bowel sounds are normal. She exhibits no mass. There is no tenderness. There is no guarding.  Musculoskeletal: Normal range of motion. She exhibits no edema.  Lymphadenopathy:    She has no cervical adenopathy.  Neurological: She is alert. She has normal reflexes.  Skin: Skin is warm and dry. She is not diaphoretic.  Psychiatric: Mood and affect normal.  Nursing note and vitals reviewed.     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  Patient has been advised of the health risks of smoking and counseled concerning cessation of tobacco products. I spent over 3 minutes for discussion and to answer questions.   Dr. [facility]  08/20/17

## 2017-08-22 ENCOUNTER — Telehealth: Payer: Self-pay | Admitting: Family Medicine

## 2017-08-22 NOTE — Telephone Encounter (Signed)
Spoke with [facility]- it is fixed

## 2017-08-22 NOTE — Telephone Encounter (Signed)
Maureen [name] walked in this am to hand me a piece of paper with an web address database ([facility] [email or web address], to confirm she have taken two doses of the Prevnar back in 2015 and 2017.

## 2017-10-14 ENCOUNTER — Ambulatory Visit (INDEPENDENT_AMBULATORY_CARE_PROVIDER_SITE_OTHER): Payer: PPO | Admitting: Family Medicine

## 2017-10-14 ENCOUNTER — Other Ambulatory Visit
Admission: RE | Admit: 2017-10-14 | Discharge: 2017-10-14 | Disposition: A | Discharge status: Home / Self Care | Payer: PPO | Source: Ambulatory Visit | Attending: Family Medicine | Admitting: Family Medicine

## 2017-10-14 ENCOUNTER — Other Ambulatory Visit: Payer: Self-pay

## 2017-10-14 ENCOUNTER — Encounter: Payer: Self-pay | Admitting: Family Medicine

## 2017-10-14 VITALS — BP 120/80 | HR 104 | Temp 98.1°F | Ht 67.0 in | Wt 135.0 lb

## 2017-10-14 DIAGNOSIS — N12 Tubulo-interstitial nephritis, not specified as acute or chronic: Secondary | ICD-10-CM

## 2017-10-14 DIAGNOSIS — D709 Neutropenia, unspecified: Secondary | ICD-10-CM | POA: Insufficient documentation

## 2017-10-14 DIAGNOSIS — T451X5A Adverse effect of antineoplastic and immunosuppressive drugs, initial encounter: Secondary | ICD-10-CM | POA: Diagnosis not present

## 2017-10-14 DIAGNOSIS — C9110 Chronic lymphocytic leukemia of B-cell type not having achieved remission: Secondary | ICD-10-CM

## 2017-10-14 DIAGNOSIS — D701 Agranulocytosis secondary to cancer chemotherapy: Secondary | ICD-10-CM

## 2017-10-14 DIAGNOSIS — C9190 Lymphoid leukemia, unspecified not having achieved remission: Secondary | ICD-10-CM | POA: Diagnosis not present

## 2017-10-14 LAB — POCT URINALYSIS DIPSTICK
Bilirubin, UA: NEGATIVE
GLUCOSE UA: NEGATIVE
Ketones, UA: NEGATIVE
NITRITE UA: POSITIVE
PH UA: 6 (ref 5.0–8.0)
SPEC GRAV UA: 1.01 (ref 1.010–1.025)
Urobilinogen, UA: 0.2 E.U./dL

## 2017-10-14 LAB — CBC
HCT: 41.7 % (ref 35.0–47.0)
HEMOGLOBIN: 13.7 g/dL (ref 12.0–16.0)
MCH: 28.5 pg (ref 26.0–34.0)
MCHC: 32.9 g/dL (ref 32.0–36.0)
MCV: 86.7 fL (ref 80.0–100.0)
PLATELETS: 139 10*3/uL — AB (ref 150–440)
RBC: 4.81 MIL/uL (ref 3.80–5.20)
RDW: 14.8 % — ABNORMAL HIGH (ref 11.5–14.5)
WBC: 12.4 10*3/uL — ABNORMAL HIGH (ref 3.6–11.0)

## 2017-10-14 MED ORDER — CEPHALEXIN 500 MG PO CAPS: 500.0000 mg | ORAL_CAPSULE | Freq: Four times a day (QID) | ORAL | 0 refills | Status: DC

## 2017-10-14 NOTE — Progress Notes (Signed)
Name: Maureen [name]   MRN: [id number]    DOB: 06/10/1941   Date:10/14/2017       Progress Note  Subjective  Chief Complaint  Chief Complaint  Patient presents with  . Urinary Tract Infection    pressure, burning and pain when urinating    Urinary Tract Infection   This is a new problem. The current episode started in the past 7 days (Saturday). The problem occurs every urination. The problem has been gradually worsening. The quality of the pain is described as burning. The pain is at a severity of 8/10. The pain is moderate. There has been no fever. There is no history of pyelonephritis. Associated symptoms include chills, frequency, hematuria and urgency. Pertinent negatives include no discharge, flank pain, hesitancy, nausea or sweats. Associated symptoms comments: incontinence. She has tried nothing for the symptoms. There is no history of recurrent UTIs.    No problem-specific Assessment & Plan notes found for this encounter.   Past Medical History:  Diagnosis Date  . Anemia   . CLL (chronic lymphocytic leukemia) ([place])   . CLL (chronic lymphocytic leukemia) ([place])   . Itching   . Vitamin B 12 deficiency     Past Surgical History:  Procedure Laterality Date  . breeast mass removed  1988   benign    History reviewed. No pertinent family history.  Social History   Socioeconomic History  . Marital status: Divorced    Spouse name: Not on file  . Number of children: Not on file  . Years of education: Not on file  . Highest education level: Not on file  Occupational History  . Not on file  Social Needs  . Financial resource strain: Not on file  . Food insecurity:    Worry: Not on file    Inability: Not on file  . Transportation needs:    Medical: Not on file    Non-medical: Not on file  Tobacco Use  . Smoking status: Current Every Day Smoker    Packs/day: 0.50    Types: Cigarettes  . Smokeless tobacco: Never Used  Substance and Sexual Activity  . Alcohol use:  Yes    Comment: occ.  . Drug use: No  . Sexual activity: Not Currently  Lifestyle  . Physical activity:    Days per week: Not on file    Minutes per session: Not on file  . Stress: Not on file  Relationships  . Social connections:    Talks on phone: Not on file    Gets together: Not on file    Attends religious service: Not on file    Active member of club or organization: Not on file    Attends meetings of clubs or organizations: Not on file    Relationship status: Not on file  . Intimate partner violence:    Fear of current or ex partner: Not on file    Emotionally abused: Not on file    Physically abused: Not on file    Forced sexual activity: Not on file  Other Topics Concern  . Not on file  Social History Narrative  . Not on file    No Known Allergies  Outpatient Medications Prior to Visit  Medication Sig Dispense Refill  . ferrous sulfate 325 (65 FE) MG tablet Take 325 mg by mouth daily with breakfast.    . folic acid (FOLVITE) 676 MCG tablet Take 800 mcg by mouth daily.      No facility-administered medications prior  to visit.     Review of Systems  Constitutional: Positive for chills. Negative for fever, malaise/fatigue and weight loss.  HENT: Negative for ear discharge, ear pain and sore throat.   Eyes: Negative for blurred vision.  Respiratory: Negative for cough, sputum production, shortness of breath and wheezing.   Cardiovascular: Negative for chest pain, palpitations and leg swelling.  Gastrointestinal: Negative for abdominal pain, blood in stool, constipation, diarrhea, heartburn, melena and nausea.  Genitourinary: Positive for frequency, hematuria and urgency. Negative for dysuria, flank pain and hesitancy.  Musculoskeletal: Negative for back pain, joint pain, myalgias and neck pain.  Skin: Negative for rash.  Neurological: Negative for dizziness, tingling, sensory change, focal weakness and headaches.  Endo/Heme/Allergies: Negative for environmental  allergies and polydipsia. Does not bruise/bleed easily.  Psychiatric/Behavioral: Negative for depression and suicidal ideas. The patient is not nervous/anxious and does not have insomnia.      Objective  Vitals:   10/14/17 0814  BP: 120/80  Pulse: (!) 104  Temp: 98.1 F (36.7 C)  TempSrc: Oral  Weight: 135 lb (61.2 kg)  Height: 5\' 7"  (1.702 m)    Physical Exam  Constitutional: She is well-developed, well-nourished, and in no distress. No distress.  HENT:  Head: Normocephalic and atraumatic.  Right Ear: External ear normal.  Left Ear: External ear normal.  Nose: Nose normal.  Mouth/Throat: Oropharynx is clear and moist.  Eyes: Pupils are equal, round, and reactive to light. Conjunctivae and EOM are normal. Right eye exhibits no discharge. Left eye exhibits no discharge.  Neck: Normal range of motion. Neck supple. No JVD present. No thyromegaly present.  Cardiovascular: Normal rate, regular rhythm, normal heart sounds and intact distal pulses. Exam reveals no gallop and no friction rub.  No murmur heard. Pulmonary/Chest: Effort normal and breath sounds normal. She has no wheezes. She has no rales.  Abdominal: Soft. Bowel sounds are normal. She exhibits no mass. There is no hepatosplenomegaly. There is tenderness in the suprapubic area. There is CVA tenderness. There is no rigidity, no rebound and no guarding.  Musculoskeletal: Normal range of motion. She exhibits no edema.  Lymphadenopathy:    She has no cervical adenopathy.  Neurological: She is alert. She has normal reflexes.  Skin: Skin is warm and dry. She is not diaphoretic.  Psychiatric: Mood and affect normal.  Nursing note and vitals reviewed.     Assessment & Plan  Problem List Items Addressed This Visit      Other   CLL (chronic lymphocytic leukemia) ([place])   Relevant Medications   cephALEXin (KEFLEX) 500 MG capsule   Chemotherapy-induced neutropenia ([facility])   Relevant Medications   cephALEXin (KEFLEX) 500 MG  capsule    Other Visit Diagnoses    Pyelonephritis    -  Primary   cbc/urine culture MUC   Relevant Medications   cephALEXin (KEFLEX) 500 MG capsule   Other Relevant Orders   POCT urinalysis dipstick (Completed)      Meds ordered this encounter  Medications  . cephALEXin (KEFLEX) 500 MG capsule    Sig: Take 1 capsule (500 mg total) by mouth 4 (four) times daily.    Dispense:  28 capsule    Refill:  0      Dr. Otilio Miu [facility]  10/14/17

## 2017-10-15 ENCOUNTER — Other Ambulatory Visit: Payer: Self-pay

## 2017-10-16 ENCOUNTER — Telehealth: Payer: Self-pay | Admitting: Internal Medicine

## 2017-10-16 ENCOUNTER — Ambulatory Visit (INDEPENDENT_AMBULATORY_CARE_PROVIDER_SITE_OTHER): Payer: PPO | Admitting: Family Medicine

## 2017-10-16 ENCOUNTER — Encounter: Payer: Self-pay | Admitting: Family Medicine

## 2017-10-16 VITALS — BP 120/62 | HR 76 | Ht 67.0 in | Wt 124.0 lb

## 2017-10-16 DIAGNOSIS — D701 Agranulocytosis secondary to cancer chemotherapy: Secondary | ICD-10-CM | POA: Diagnosis not present

## 2017-10-16 DIAGNOSIS — N12 Tubulo-interstitial nephritis, not specified as acute or chronic: Secondary | ICD-10-CM

## 2017-10-16 DIAGNOSIS — T451X5A Adverse effect of antineoplastic and immunosuppressive drugs, initial encounter: Secondary | ICD-10-CM | POA: Diagnosis not present

## 2017-10-16 LAB — URINE CULTURE

## 2017-10-16 NOTE — Telephone Encounter (Signed)
Patient informed to keep ct scan apts

## 2017-10-16 NOTE — Progress Notes (Signed)
Name: Maureen [name]   MRN: [id number]    DOB: [birth year]   Date:10/16/2017       Progress Note  Subjective  Chief Complaint  Chief Complaint  Patient presents with  . Follow-up    "feeling alot better"    Urinary Tract Infection   This is a new problem. The current episode started in the past 7 days. The problem occurs intermittently. The problem has been waxing and waning. The quality of the pain is described as burning. There has been no fever (now). Pertinent negatives include no chills, discharge, flank pain, frequency, hematuria, hesitancy, nausea, sweats, urgency or vomiting. Associated symptoms comments: Mild suprapubic.    No problem-specific Assessment & Plan notes found for this encounter.   Past Medical History:  Diagnosis Date  . Anemia   . CLL (chronic lymphocytic leukemia) ([place])   . CLL (chronic lymphocytic leukemia) ([place])   . Itching   . Vitamin B 12 deficiency     Past Surgical History:  Procedure Laterality Date  . breeast mass removed  1988   benign    History reviewed. No pertinent family history.  Social History   Socioeconomic History  . Marital status: Divorced    Spouse name: Not on file  . Number of children: Not on file  . Years of education: Not on file  . Highest education level: Not on file  Occupational History  . Not on file  Social Needs  . Financial resource strain: Not on file  . Food insecurity:    Worry: Not on file    Inability: Not on file  . Transportation needs:    Medical: Not on file    Non-medical: Not on file  Tobacco Use  . Smoking status: Current Every Day Smoker    Packs/day: 0.50    Types: Cigarettes  . Smokeless tobacco: Never Used  Substance and Sexual Activity  . Alcohol use: Yes    Comment: occ.  . Drug use: No  . Sexual activity: Not Currently  Lifestyle  . Physical activity:    Days per week: Not on file    Minutes per session: Not on file  . Stress: Not on file  Relationships  . Social  connections:    Talks on phone: Not on file    Gets together: Not on file    Attends religious service: Not on file    Active member of club or organization: Not on file    Attends meetings of clubs or organizations: Not on file    Relationship status: Not on file  . Intimate partner violence:    Fear of current or ex partner: Not on file    Emotionally abused: Not on file    Physically abused: Not on file    Forced sexual activity: Not on file  Other Topics Concern  . Not on file  Social History Narrative  . Not on file    No Known Allergies  Outpatient Medications Prior to Visit  Medication Sig Dispense Refill  . cephALEXin (KEFLEX) 500 MG capsule Take 1 capsule (500 mg total) by mouth 4 (four) times daily. 28 capsule 0  . ferrous sulfate 325 (65 FE) MG tablet Take 325 mg by mouth daily with breakfast.    . folic acid (FOLVITE) 409 MCG tablet Take 800 mcg by mouth daily.      No facility-administered medications prior to visit.     Review of Systems  Constitutional: Negative for chills, fever, malaise/fatigue and  weight loss.  HENT: Negative for ear discharge, ear pain and sore throat.   Eyes: Negative for blurred vision.  Respiratory: Negative for cough, sputum production, shortness of breath and wheezing.   Cardiovascular: Negative for chest pain, palpitations and leg swelling.  Gastrointestinal: Negative for abdominal pain, blood in stool, constipation, diarrhea, heartburn, melena, nausea and vomiting.  Genitourinary: Negative for dysuria, flank pain, frequency, hematuria, hesitancy and urgency.  Musculoskeletal: Negative for back pain, joint pain, myalgias and neck pain.  Skin: Negative for rash.  Neurological: Negative for dizziness, tingling, sensory change, focal weakness and headaches.  Endo/Heme/Allergies: Negative for environmental allergies and polydipsia. Does not bruise/bleed easily.  Psychiatric/Behavioral: Negative for depression and suicidal ideas. The  patient is not nervous/anxious and does not have insomnia.      Objective  Vitals:   10/16/17 0802  BP: 120/62  Pulse: 76  SpO2: 98%  Weight: 124 lb (56.2 kg)  Height: 5\' 7"  (1.702 m)    Physical Exam  Constitutional: She is well-developed, well-nourished, and in no distress. No distress.  HENT:  Head: Normocephalic and atraumatic.  Right Ear: External ear normal.  Left Ear: External ear normal.  Nose: Nose normal.  Mouth/Throat: Oropharynx is clear and moist.  Eyes: Pupils are equal, round, and reactive to light. Conjunctivae and EOM are normal. Right eye exhibits no discharge. Left eye exhibits no discharge.  Neck: Normal range of motion. Neck supple. No JVD present. No thyromegaly present.  Cardiovascular: Normal rate, regular rhythm, normal heart sounds and intact distal pulses. Exam reveals no gallop and no friction rub.  No murmur heard. Pulmonary/Chest: Effort normal and breath sounds normal. She has no wheezes. She has no rales.  Abdominal: Soft. Bowel sounds are normal. She exhibits no mass. There is tenderness in the suprapubic area. There is no guarding and no CVA tenderness.  Mild suprapubic  Musculoskeletal: Normal range of motion. She exhibits no edema.  Lymphadenopathy:    She has no cervical adenopathy.  Neurological: She is alert.  Skin: Skin is warm and dry. She is not diaphoretic.  Psychiatric: Mood and affect normal.  Nursing note and vitals reviewed.     Assessment & Plan  Problem List Items Addressed This Visit      Other   Chemotherapy-induced neutropenia ([place])    Other Visit Diagnoses    Pyelonephritis    -  Primary   Pyelonephritis       cbc/urine culture MUC      No orders of the defined types were placed in this encounter.     Dr. [facility]  10/16/17

## 2017-10-16 NOTE — Telephone Encounter (Signed)
Please inform patient that it is okay to proceed with CT scan [not MRI as reported] as planned for tomorrow even if she is on anti-biotics-  GB

## 2017-10-16 NOTE — Patient Instructions (Signed)
Pyelonephritis, Adult Pyelonephritis is a kidney infection. The kidneys are the organs that filter a person's blood and move waste out of the bloodstream and into the urine. Urine passes from the kidneys, through the ureters, and into the bladder. There are two main types of pyelonephritis:  Infections that come on quickly without any warning (acute pyelonephritis).  Infections that last for a long period of time (chronic pyelonephritis).  In most cases, the infection clears up with treatment and does not cause further problems. More severe infections or chronic infections can sometimes spread to the bloodstream or lead to other problems with the kidneys. What are the causes? This condition is usually caused by:  Bacteria traveling from the bladder to the kidney through infected urine. The urine in the bladder can become infected with bacteria from: ? Bladder infection (cystitis). ? Inflammation of the prostate gland (prostatitis). ? Sexual intercourse, in females.  Bacteria traveling from the bloodstream to the kidney.  What increases the risk? This condition is more likely to develop in:  Pregnant women.  Older people.  People who have diabetes.  People who have kidney stones or bladder stones.  People who have other abnormalities of the kidney or ureter.  People who have a catheter placed in the bladder.  People who have cancer.  People who are sexually active.  Women who use spermicides.  People who have had a prior urinary tract infection.  What are the signs or symptoms? Symptoms of this condition include:  Frequent urination.  Strong or persistent urge to urinate.  Burning or stinging when urinating.  Abdominal pain.  Back pain.  Pain in the side or flank area.  Fever.  Chills.  Blood in the urine, or dark urine.  Nausea.  Vomiting.  How is this diagnosed? This condition may be diagnosed based on:  Medical history and physical exam.  Urine  tests.  Blood tests.  You may also have imaging tests of the kidneys, such as an ultrasound or CT scan. How is this treated? Treatment for this condition may depend on the severity of the infection.  If the infection is mild and is found early, you may be treated with antibiotic medicines taken by mouth. You will need to drink fluids to remain hydrated.  If the infection is more severe, you may need to stay in the hospital and receive antibiotics given directly into a vein through an IV tube. You may also need to receive fluids through an IV tube if you are not able to remain hydrated. After your hospital stay, you may need to take oral antibiotics for a period of time.  Other treatments may be required, depending on the cause of the infection. Follow these instructions at home: Medicines  Take over-the-counter and prescription medicines only as told by your health care provider.  If you were prescribed an antibiotic medicine, take it as told by your health care provider. Do not stop taking the antibiotic even if you start to feel better. General instructions  Drink enough fluid to keep your urine clear or pale yellow.  Avoid caffeine, tea, and carbonated beverages. They tend to irritate the bladder.  Urinate often. Avoid holding in urine for long periods of time.  Urinate before and after sex.  After a bowel movement, women should cleanse from front to back. Use each tissue only once.  Keep all follow-up visits as told by your health care provider. This is important. Contact a health care provider if:  Your symptoms   do not get better after 2 days of treatment.  Your symptoms get worse.  You have a fever. Get help right away if:  You are unable to take your antibiotics or fluids.  You have shaking chills.  You vomit.  You have severe flank or back pain.  You have extreme weakness or fainting. This information is not intended to replace advice given to you by your  health care provider. Make sure you discuss any questions you have with your health care provider. Document Released: 07/02/2005 Document Revised: 12/08/2015 Document Reviewed: 10/25/2014 Elsevier Interactive Patient Education  2018 [employer]  

## 2017-10-17 ENCOUNTER — Ambulatory Visit
Admission: RE | Admit: 2017-10-17 | Discharge: 2017-10-17 | Disposition: A | Discharge status: Home / Self Care | Payer: PPO | Source: Ambulatory Visit | Attending: Internal Medicine | Admitting: Internal Medicine

## 2017-10-17 ENCOUNTER — Telehealth: Payer: Self-pay | Admitting: *Deleted

## 2017-10-17 ENCOUNTER — Inpatient Hospital Stay: Payer: PPO | Attending: Internal Medicine

## 2017-10-17 DIAGNOSIS — I700 Atherosclerosis of aorta: Secondary | ICD-10-CM | POA: Insufficient documentation

## 2017-10-17 DIAGNOSIS — N390 Urinary tract infection, site not specified: Secondary | ICD-10-CM | POA: Insufficient documentation

## 2017-10-17 DIAGNOSIS — N2889 Other specified disorders of kidney and ureter: Secondary | ICD-10-CM | POA: Diagnosis not present

## 2017-10-17 DIAGNOSIS — C9110 Chronic lymphocytic leukemia of B-cell type not having achieved remission: Secondary | ICD-10-CM | POA: Insufficient documentation

## 2017-10-17 DIAGNOSIS — E538 Deficiency of other specified B group vitamins: Secondary | ICD-10-CM | POA: Insufficient documentation

## 2017-10-17 DIAGNOSIS — I714 Abdominal aortic aneurysm, without rupture: Secondary | ICD-10-CM | POA: Insufficient documentation

## 2017-10-17 DIAGNOSIS — F17210 Nicotine dependence, cigarettes, uncomplicated: Secondary | ICD-10-CM | POA: Insufficient documentation

## 2017-10-17 DIAGNOSIS — J432 Centrilobular emphysema: Secondary | ICD-10-CM | POA: Diagnosis not present

## 2017-10-17 DIAGNOSIS — R591 Generalized enlarged lymph nodes: Secondary | ICD-10-CM | POA: Insufficient documentation

## 2017-10-17 DIAGNOSIS — R161 Splenomegaly, not elsewhere classified: Secondary | ICD-10-CM | POA: Insufficient documentation

## 2017-10-17 DIAGNOSIS — R918 Other nonspecific abnormal finding of lung field: Secondary | ICD-10-CM | POA: Diagnosis not present

## 2017-10-17 LAB — COMPREHENSIVE METABOLIC PANEL
ALK PHOS: 56 U/L (ref 38–126)
ALT: 14 U/L (ref 14–54)
ANION GAP: 8 (ref 5–15)
AST: 20 U/L (ref 15–41)
Albumin: 4.4 g/dL (ref 3.5–5.0)
BILIRUBIN TOTAL: 0.5 mg/dL (ref 0.3–1.2)
BUN: 19 mg/dL (ref 6–20)
CO2: 30 mmol/L (ref 22–32)
Calcium: 9.3 mg/dL (ref 8.9–10.3)
Chloride: 101 mmol/L (ref 101–111)
Creatinine, Ser: 0.55 mg/dL (ref 0.44–1.00)
GFR calc non Af Amer: >60 mL/min (ref >60)
Glucose, Bld: 109 mg/dL — ABNORMAL HIGH (ref 65–99)
POTASSIUM: 4.2 mmol/L (ref 3.5–5.1)
SODIUM: 139 mmol/L (ref 135–145)
TOTAL PROTEIN: 7.4 g/dL (ref 6.5–8.1)

## 2017-10-17 LAB — CBC WITH DIFFERENTIAL/PLATELET
BASOS ABS: 0 10*3/uL (ref 0–0.1)
Basophils Relative: 0 %
Eosinophils Absolute: 0.1 10*3/uL (ref 0–0.7)
Eosinophils Relative: 1 %
HEMATOCRIT: 41.9 % (ref 35.0–47.0)
HEMOGLOBIN: 13.8 g/dL (ref 12.0–16.0)
LYMPHS PCT: 57 %
Lymphs Abs: 4.6 10*3/uL — ABNORMAL HIGH (ref 1.0–3.6)
MCH: 28.7 pg (ref 26.0–34.0)
MCHC: 32.9 g/dL (ref 32.0–36.0)
MCV: 87.1 fL (ref 80.0–100.0)
Monocytes Absolute: 0.6 10*3/uL (ref 0.2–0.9)
Monocytes Relative: 7 %
NEUTROS ABS: 2.9 10*3/uL (ref 1.4–6.5)
NEUTROS PCT: 35 %
Platelets: 183 10*3/uL (ref 150–440)
RBC: 4.81 MIL/uL (ref 3.80–5.20)
RDW: 14.8 % — ABNORMAL HIGH (ref 11.5–14.5)
WBC: 8.1 10*3/uL (ref 3.6–11.0)

## 2017-10-17 LAB — LACTATE DEHYDROGENASE: LDH: 135 U/L (ref 98–192)

## 2017-10-17 MED ORDER — IOPAMIDOL (ISOVUE-300) INJECTION 61%
50.0000 mL | Freq: Once | INTRAVENOUS | Status: AC | PRN
  Administered 2017-10-17: 100 mL via INTRAVENOUS

## 2017-10-17 NOTE — Telephone Encounter (Signed)
Pt has an apt on 4/9 to discuss results

## 2017-10-17 NOTE — Telephone Encounter (Signed)
Called report  IMPRESSION: 1. Significant partial treatment response. Mild axillary, mediastinal, hilar, retroperitoneal, mesenteric and bilateral pelvic adenopathy, all significantly decreased in the interval. Mild splenomegaly, significantly decreased. 2. New 3.4 cm masslike focus of hypoenhancement in the posterior upper right kidney, indeterminate. Differential includes focal acute pyelonephritis or renal cell carcinoma. If there are clinical findings suspicious for pyelonephritis, recommend short-term post antibiotic therapy follow-up imaging with CT abdomen without and with IV contrast. If there are no clinical findings suspicious for pyelonephritis, recommend further evaluation at this time with MRI abdomen without and with IV contrast. 3. Stable 3.1 cm infrarenal abdominal aortic aneurysm. Recommend followup by ultrasound in 3 years. This recommendation follows ACR consensus guidelines: White Paper of the ACR Incidental Findings Committee II on Vascular Findings. J Am Coll Radiol 2013; 47:096-283. 4. Aortic Atherosclerosis (ICD10-I70.0) and Emphysema (ICD10-J43.9).  These results were called by telephone at the time of interpretation on 10/17/2017 at 3:07 pm to Dr. Charlaine Dalton , who verbally acknowledged these results.   Electronically Signed   By: Ilona Sorrel M.D.   On: 10/17/2017 15:08

## 2017-10-22 ENCOUNTER — Other Ambulatory Visit: Payer: Self-pay

## 2017-10-22 ENCOUNTER — Encounter: Payer: Self-pay | Admitting: Internal Medicine

## 2017-10-22 ENCOUNTER — Inpatient Hospital Stay (HOSPITAL_BASED_OUTPATIENT_CLINIC_OR_DEPARTMENT_OTHER): Payer: PPO | Admitting: Internal Medicine

## 2017-10-22 VITALS — BP 141/85 | HR 80 | Temp 98.0°F | Resp 20 | Ht 67.0 in | Wt 126.8 lb

## 2017-10-22 DIAGNOSIS — C9110 Chronic lymphocytic leukemia of B-cell type not having achieved remission: Secondary | ICD-10-CM

## 2017-10-22 DIAGNOSIS — F17210 Nicotine dependence, cigarettes, uncomplicated: Secondary | ICD-10-CM | POA: Diagnosis not present

## 2017-10-22 DIAGNOSIS — E538 Deficiency of other specified B group vitamins: Secondary | ICD-10-CM | POA: Diagnosis not present

## 2017-10-22 DIAGNOSIS — N390 Urinary tract infection, site not specified: Secondary | ICD-10-CM | POA: Diagnosis not present

## 2017-10-22 DIAGNOSIS — N2889 Other specified disorders of kidney and ureter: Secondary | ICD-10-CM | POA: Insufficient documentation

## 2017-10-22 NOTE — Assessment & Plan Note (Addendum)
#   CLL/  recurrent stage IV- elevated white count/lymphadenopathy/splenomegaly JAN 2018- FISH Positive for 13 Q del.  #Currently status post s/p Gazyva #6 cycles [last treatment 01/23/2017]; significant hematologic response noted-with normal white count platelets and hemoglobin.  April 2019- CT scan Significant partial response-with significant improvement of the axillary/mediastinal/abdominal adenopathy; along with splenomegaly.   # UTI/ pyelnephritis- s/p Keflex significant improvement of symptoms.  Right kidney lesion 3 cm noted-suspect pyelonephritis versus RCC.  As recommended by radiology would recommend a CT scan with contrast in 4 months.   # follow up in 4 months/labs or sooner if needed. CT scan prior.   # I reviewed the blood work- with the patient in detail; also reviewed the imaging independently [as summarized above]; and with the patient in detail.
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
## 2017-11-29 DIAGNOSIS — S46011A Strain of muscle(s) and tendon(s) of the rotator cuff of right shoulder, initial encounter: Secondary | ICD-10-CM | POA: Diagnosis not present

## 2017-12-18 ENCOUNTER — Ambulatory Visit (INDEPENDENT_AMBULATORY_CARE_PROVIDER_SITE_OTHER): Payer: PPO

## 2017-12-18 VITALS — BP 110/60 | HR 78 | Temp 97.8°F | Resp 12 | Ht 67.0 in | Wt 121.0 lb

## 2017-12-18 DIAGNOSIS — Z9181 History of falling: Secondary | ICD-10-CM | POA: Diagnosis not present

## 2017-12-18 DIAGNOSIS — Z0000 Encounter for general adult medical examination without abnormal findings: Secondary | ICD-10-CM

## 2017-12-18 DIAGNOSIS — Z599 Problem related to housing and economic circumstances, unspecified: Secondary | ICD-10-CM

## 2017-12-18 DIAGNOSIS — Z598 Other problems related to housing and economic circumstances: Secondary | ICD-10-CM

## 2017-12-18 NOTE — Progress Notes (Signed)
Subjective:   Maureen [name] is a 77 y.o. female who presents for Medicare Annual (Subsequent) preventive examination.  Review of Systems:  N/A Cardiac Risk Factors include: advanced age (>9men, >62 women);smoking/ tobacco exposure     Objective:     Vitals: BP 110/60 (BP Location: Right Arm, Patient Position: Sitting, Cuff Size: Normal)   Pulse 78   Temp 97.8 F (36.6 C) (Oral)   Resp 12   Ht 5\' 7"  (1.702 m)   Wt 121 lb (54.9 kg)   SpO2 94%   BMI 18.95 kg/m   Body mass index is 18.95 kg/m.  Advanced Directives 12/18/2017 10/22/2017 07/23/2017 03/26/2017 01/23/2017 12/17/2016 11/21/2016  Does Patient Have a Medical Advance Directive? No No No No Yes No No  Type of Advance Directive - - - - Press photographer;Living will - -  Does patient want to make changes to medical advance directive? - - - - No - Patient declined - -  Copy of [place] in Chart? - - - - No - copy requested - -  Would patient like information on creating a medical advance directive? Yes (MAU/Ambulatory/Procedural Areas - Information given) No - Patient declined No - Patient declined No - Patient declined - Yes (MAU/Ambulatory/Procedural Areas - Information given) No - Patient declined    Tobacco Social History   Tobacco Use  Smoking Status Current Every Day Smoker  . Packs/day: 0.50  . Years: 56.00  . Pack years: 28.00  . Types: Cigarettes  Smokeless Tobacco Never Used     Ready to quit: Yes Counseling given: Yes  Clinical Intake:  Pre-visit preparation completed: Yes  Pain : No/denies pain   BMI - recorded: 18.95 Nutritional Status: BMI of 19-24  Normal Nutritional Risks: None Diabetes: No  How often do you need to have someone help you when you read instructions, pamphlets, or other written materials from your doctor or pharmacy?: 1 - Never  Interpreter Needed?: No  Information entered by :: AEversole, LPN  Past Medical History:  Diagnosis Date  . Anemia   .  CLL (chronic lymphocytic leukemia) ([place]) 2002  . CLL (chronic lymphocytic leukemia) ([place])   . Itching   . Vitamin B 12 deficiency    Past Surgical History:  Procedure Laterality Date  . breeast mass removed  1988   benign   Family History  Family history unknown: Yes   Social History   Socioeconomic History  . Marital status: Divorced    Spouse name: Not on file  . Number of children: 2  . Years of education: Not on file  . Highest education level: 9th grade  Occupational History  . Occupation: Retired  Scientific laboratory technician  . Financial resource strain: Very hard  . Food insecurity:    Worry: Often true    Inability: Often true  . Transportation needs:    Medical: No    Non-medical: No  Tobacco Use  . Smoking status: Current Every Day Smoker    Packs/day: 0.50    Years: 56.00    Pack years: 28.00    Types: Cigarettes  . Smokeless tobacco: Never Used  Substance and Sexual Activity  . Alcohol use: Yes    Alcohol/week: 0.6 oz    Types: 1 Glasses of wine per week    Comment: occassional  . Drug use: No  . Sexual activity: Not Currently  Lifestyle  . Physical activity:    Days per week: 3 days  Minutes per session: 40 min  . Stress: Not at all  Relationships  . Social connections:    Talks on phone: Patient refused    Gets together: Patient refused    Attends religious service: Patient refused    Active member of club or organization: Patient refused    Attends meetings of clubs or organizations: Patient refused    Relationship status: Divorced  Other Topics Concern  . Not on file  Social History Narrative  . Not on file    Outpatient Encounter Medications as of 12/18/2017  Medication Sig  . ferrous sulfate 325 (65 FE) MG tablet Take 325 mg by mouth daily with breakfast.  . folic acid (FOLVITE) 329 MCG tablet Take 800 mcg by mouth daily.    No facility-administered encounter medications on file as of 12/18/2017.     Activities of Daily Living In your present  state of health, do you have any difficulty performing the following activities: 12/18/2017  Hearing? N  Comment denies hearing aids  Vision? N  Comment wears eyeglasses  Difficulty concentrating or making decisions? Y  Comment shor term memory loss  Walking or climbing stairs? Y  Comment dyspnea  Dressing or bathing? N  Doing errands, shopping? N  Preparing Food and eating ? N  Comment denies dentures  Using the Toilet? N  In the past six months, have you accidently leaked urine? Y  Comment urgency  Do you have problems with loss of bowel control? N  Managing your Medications? N  Managing your Finances? N  Housekeeping or managing your Housekeeping? N  Some recent data might be hidden    Patient Care Team: Juline Patch, MD as PCP - General (Family Medicine) Earnestine Leys, MD (Specialist) Cammie Sickle, MD as Consulting Physician (Internal Medicine)    Assessment:   This is a routine wellness examination for Maureen [name].  Exercise Activities and Dietary recommendations Current Exercise Habits: Home exercise routine, Type of exercise: strength training/weights;walking, Time (Minutes): 45, Frequency (Times/Week): 3, Weekly Exercise (Minutes/Week): 135, Intensity: Mild, Exercise limited by: None identified  Goals    . DIET - INCREASE WATER INTAKE     Recommend to drink at least 6-8 8oz glasses of water per day.       Fall Risk Fall Risk  12/18/2017 12/17/2016 03/20/2016  Falls in the past year? Yes No No  Comment tripped over cart at grocery store - -  Number falls in past yr: 1 - -  Injury with Fall? Yes - -  Comment fractured L patella - -  Risk Factor Category  High Fall Risk - -  Risk for fall due to : Other (Comment);Impaired vision - -  Risk for fall due to: Comment fatigue; wears eyeglasses - -  Follow up Falls evaluation completed;Education provided;Falls prevention discussed - -   FALL RISK PREVENTION PERTAINING TO HOME: Is your home free of loose throw rugs  in walkways, pet beds, electrical cords, etc? Yes Is there adequate lighting in your home to reduce risk of falls?  Yes Are there stairs in or around your home WITH handrails? Yes  ASSISTIVE DEVICES UTILIZED TO PREVENT FALLS: Use of a cane, walker or w/c? No Grab bars in the bathroom? No. Has fear of falling and is requesting to be installed. [facility]3 referral sent to [facility]  Shower chair or a place to sit while bathing? No. Has fear of falling and is requesting to be installed. C3 referral sent to [facility]  An elevated toilet  seat or a handicapped toilet? No  Timed Get Up and Go Performed: Yes. Pt ambulated 10 feet within 13 sec. Gait slow, steady and without the use of an assistive device. No intervention required at this time. Fall risk prevention has been discussed.  Community Resource Referral:  Data processing manager Referral sent to [employer] for installation of grab bars in the shower and a shower chair. Pt declined my offer to send Community Resource Referral to [facility] for elevated toilet seat.  Depression Screen PHQ 2/9 Scores 12/18/2017 12/17/2016 03/20/2016  PHQ - 2 Score 0 0 1  PHQ- 9 Score 0 - -     Cognitive Function     6CIT Screen 12/18/2017 12/17/2016  What Year? 0 points 0 points  What month? 0 points 0 points  What time? 3 points 0 points  Count back from 20 0 points 0 points  Months in reverse 0 points 0 points  Repeat phrase 0 points 2 points  Total Score 3 2    Immunization History  Administered Date(s) Administered  . Influenza, High Dose Seasonal PF 04/01/2017  . Influenza,inj,Quad PF,6+ Mos 04/07/2015  . Influenza-Unspecified 04/15/2014, 04/06/2016, 04/01/2017  . Pneumococcal Conjugate-13 03/30/2017  . Pneumococcal-Unspecified 04/06/2016    Qualifies for Shingles Vaccine? Yes. Due for Shingrix. Education has been provided regarding the importance of this vaccine. Pt has been advised to call her insurance company to determine her out of pocket expense. Advised she may also  receive this vaccine at her local pharmacy or Health Dept. Verbalized acceptance and understanding.  Due for Tdap vaccine. Education has been provided regarding the importance of this vaccine. Pt has been advised she may receive this vaccine at her local pharmacy or Health Dept. Also advised to provide a copy of her vaccination record if she chooses to receive this vaccine at her local pharmacy. Verbalized acceptance and understanding.  Screening Tests Health Maintenance  Topic Date Due  . DEXA SCAN  08/20/2018 (Originally 04/28/2004)  . INFLUENZA VACCINE  02/13/2018  . PNA vac Low Risk Adult  Completed  . TETANUS/TDAP  Discontinued    Cancer Screenings: Lung: Low Dose CT Chest recommended if Age 69-80 years, 30 pack-year currently smoking OR have quit w/in 15years. Patient does not qualify. Breast Screening: No longer required  Bone Density/Dexa: No longer required unless oncology feels otherwise Colorectal: No longer required  Additional Screenings: Hepatitis C Screening: Does not qualify    Plan:  I have personally reviewed and addressed the Medicare Annual Wellness questionnaire and have noted the following in the patient's chart:  A. Medical and social history B. Use of alcohol, tobacco or illicit drugs  C. Current medications and supplements D. Functional ability and status E.  Nutritional status F.  Physical activity G. Advance directives H. List of other physicians I.  Hospitalizations, surgeries, and ER visits in previous 12 months J.  [place] such as hearing and vision if needed, cognitive and depression L. Referrals and appointments  In addition, I have reviewed and discussed with patient certain preventive protocols, quality metrics, and best practice recommendations. A written personalized care plan for preventive services as well as general preventive health recommendations were provided to patient.  Signed,  Aleatha Borer, LPN Nurse Health  Advisor  MD Recommendations: Due for Shingrix. Education has been provided regarding the importance of this vaccine. Pt has been advised to call her insurance company to determine her out of pocket expense. Advised she may also receive this vaccine at her  local pharmacy or Health Dept. Verbalized acceptance and understanding.  Due for Tdap vaccine. Education has been provided regarding the importance of this vaccine. Pt has been advised she may receive this vaccine at her local pharmacy or Health Dept. Also advised to provide a copy of her vaccination record if she chooses to receive this vaccine at her local pharmacy. Verbalized acceptance and understanding.

## 2017-12-18 NOTE — Patient Instructions (Signed)
[name] , Thank you for taking time to come for your Medicare Wellness Visit. I appreciate your ongoing commitment to your health goals. Please review the following plan we discussed and let me know if I can assist you in the future.   Screening recommendations/referrals: Colorectal Screening: No longer required Mammogram: No longer required Bone Density: No longer required unless oncology feels otherwise  Vision and Dental Exams: Recommended annual ophthalmology exams for early detection of glaucoma and other disorders of the eye Recommended annual dental exams for proper oral hygiene  Vaccinations: Influenza vaccine: Up to date Pneumococcal vaccine: Up to date Tdap vaccine: Declined. Please call your insurance company to determine your out of pocket expense. You may also receive this vaccine at your local pharmacy or Health Dept. Shingles vaccine: Please call your insurance company to determine your out of pocket expense for the Shingrix vaccine. You may also receive this vaccine at your local pharmacy or Health Dept.  Advanced directives: Advance directive discussed with you today. I have provided a copy for you to complete at home and have notarized. Once this is complete please bring a copy in to our office so we can scan it into your chart.  Conditions/risks identified: Recommend to drink at least 6-8 8oz glasses of water per day.  Next appointment: Please schedule your Annual Wellness Visit with your Nurse Health Advisor in one year.  Preventive Care 16 Years and Older, Female Preventive care refers to lifestyle choices and visits with your health care provider that can promote health and wellness. What does preventive care include?  A yearly physical exam. This is also called an annual well check.  Dental exams once or twice a year.  Routine eye exams. Ask your health care provider how often you should have your eyes checked.  Personal lifestyle choices,  including:  Daily care of your teeth and gums.  Regular physical activity.  Eating a healthy diet.  Avoiding tobacco and drug use.  Limiting alcohol use.  Practicing safe sex.  Taking low-dose aspirin every day.  Taking vitamin and mineral supplements as recommended by your health care provider. What happens during an annual well check? The services and screenings done by your health care provider during your annual well check will depend on your age, overall health, lifestyle risk factors, and family history of disease. Counseling  Your health care provider may ask you questions about your:  Alcohol use.  Tobacco use.  Drug use.  Emotional well-being.  Home and relationship well-being.  Sexual activity.  Eating habits.  History of falls.  Memory and ability to understand (cognition).  Work and work Statistician.  Reproductive health. Screening  You may have the following tests or measurements:  Height, weight, and BMI.  Blood pressure.  Lipid and cholesterol levels. These may be checked every 5 years, or more frequently if you are over 49 years old.  Skin check.  Lung cancer screening. You may have this screening every year starting at age 61 if you have a 30-pack-year history of smoking and currently smoke or have quit within the past 15 years.  Fecal occult blood test (FOBT) of the stool. You may have this test every year starting at age 54.  Flexible sigmoidoscopy or colonoscopy. You may have a sigmoidoscopy every 5 years or a colonoscopy every 10 years starting at age 56.  Hepatitis C blood test.  Hepatitis B blood test.  Sexually transmitted disease (STD) testing.  Diabetes screening. This is done by checking  your blood sugar (glucose) after you have not eaten for a while (fasting). You may have this done every 1-3 years.  Bone density scan. This is done to screen for osteoporosis. You may have this done starting at age 86.  Mammogram. This  may be done every 1-2 years. Talk to your health care provider about how often you should have regular mammograms. Talk with your health care provider about your test results, treatment options, and if necessary, the need for more tests. Vaccines  Your health care provider may recommend certain vaccines, such as:  Influenza vaccine. This is recommended every year.  Tetanus, diphtheria, and acellular pertussis (Tdap, Td) vaccine. You may need a Td booster every 10 years.  Zoster vaccine. You may need this after age 70.  Pneumococcal 13-valent conjugate (PCV13) vaccine. One dose is recommended after age 61.  Pneumococcal polysaccharide (PPSV23) vaccine. One dose is recommended after age 56. Talk to your health care provider about which screenings and vaccines you need and how often you need them. This information is not intended to replace advice given to you by your health care provider. Make sure you discuss any questions you have with your health care provider. Document Released: 07/29/2015 Document Revised: 03/21/2016 Document Reviewed: 05/03/2015 Elsevier Interactive Patient Education  2017 [place] Prevention in the Home Falls can cause injuries. They can happen to people of all ages. There are many things you can do to make your home safe and to help prevent falls. What can I do on the outside of my home?  Regularly fix the edges of walkways and driveways and fix any cracks.  Remove anything that might make you trip as you walk through a door, such as a raised step or threshold.  Trim any bushes or trees on the path to your home.  Use bright outdoor lighting.  Clear any walking paths of anything that might make someone trip, such as rocks or tools.  Regularly check to see if handrails are loose or broken. Make sure that both sides of any steps have handrails.  Any raised decks and porches should have guardrails on the edges.  Have any leaves, snow, or ice cleared  regularly.  Use sand or salt on walking paths during winter.  Clean up any spills in your garage right away. This includes oil or grease spills. What can I do in the bathroom?  Use night lights.  Install grab bars by the toilet and in the tub and shower. Do not use towel bars as grab bars.  Use non-skid mats or decals in the tub or shower.  If you need to sit down in the shower, use a plastic, non-slip stool.  Keep the floor dry. Clean up any water that spills on the floor as soon as it happens.  Remove soap buildup in the tub or shower regularly.  Attach bath mats securely with double-sided non-slip rug tape.  Do not have throw rugs and other things on the floor that can make you trip. What can I do in the bedroom?  Use night lights.  Make sure that you have a light by your bed that is easy to reach.  Do not use any sheets or blankets that are too big for your bed. They should not hang down onto the floor.  Have a firm chair that has side arms. You can use this for support while you get dressed.  Do not have throw rugs and other things on the floor  that can make you trip. What can I do in the kitchen?  Clean up any spills right away.  Avoid walking on wet floors.  Keep items that you use a lot in easy-to-reach places.  If you need to reach something above you, use a strong step stool that has a grab bar.  Keep electrical cords out of the way.  Do not use floor polish or wax that makes floors slippery. If you must use wax, use non-skid floor wax.  Do not have throw rugs and other things on the floor that can make you trip. What can I do with my stairs?  Do not leave any items on the stairs.  Make sure that there are handrails on both sides of the stairs and use them. Fix handrails that are broken or loose. Make sure that handrails are as long as the stairways.  Check any carpeting to make sure that it is firmly attached to the stairs. Fix any carpet that is loose  or worn.  Avoid having throw rugs at the top or bottom of the stairs. If you do have throw rugs, attach them to the floor with carpet tape.  Make sure that you have a light switch at the top of the stairs and the bottom of the stairs. If you do not have them, ask someone to add them for you. What else can I do to help prevent falls?  Wear shoes that:  Do not have high heels.  Have rubber bottoms.  Are comfortable and fit you well.  Are closed at the toe. Do not wear sandals.  If you use a stepladder:  Make sure that it is fully opened. Do not climb a closed stepladder.  Make sure that both sides of the stepladder are locked into place.  Ask someone to hold it for you, if possible.  Clearly mark and make sure that you can see:  Any grab bars or handrails.  First and last steps.  Where the edge of each step is.  Use tools that help you move around (mobility aids) if they are needed. These include:  Canes.  Walkers.  Scooters.  Crutches.  Turn on the lights when you go into a dark area. Replace any light bulbs as soon as they burn out.  Set up your furniture so you have a clear path. Avoid moving your furniture around.  If any of your floors are uneven, fix them.  If there are any pets around you, be aware of where they are.  Review your medicines with your doctor. Some medicines can make you feel dizzy. This can increase your chance of falling. Ask your doctor what other things that you can do to help prevent falls. This information is not intended to replace advice given to you by your health care provider. Make sure you discuss any questions you have with your health care provider. Document Released: 04/28/2009 Document Revised: 12/08/2015 Document Reviewed: 08/06/2014 Elsevier Interactive Patient Education  2017 [employer].

## 2017-12-20 DIAGNOSIS — S46011A Strain of muscle(s) and tendon(s) of the rotator cuff of right shoulder, initial encounter: Secondary | ICD-10-CM | POA: Diagnosis not present

## 2017-12-31 DIAGNOSIS — S46011D Strain of muscle(s) and tendon(s) of the rotator cuff of right shoulder, subsequent encounter: Secondary | ICD-10-CM | POA: Diagnosis not present

## 2018-01-13 ENCOUNTER — Other Ambulatory Visit: Payer: Self-pay

## 2018-02-11 ENCOUNTER — Other Ambulatory Visit: Payer: Self-pay | Admitting: *Deleted

## 2018-02-11 DIAGNOSIS — C9110 Chronic lymphocytic leukemia of B-cell type not having achieved remission: Secondary | ICD-10-CM

## 2018-02-13 ENCOUNTER — Ambulatory Visit
Admission: RE | Admit: 2018-02-13 | Discharge: 2018-02-13 | Disposition: A | Discharge status: Home / Self Care | Payer: PPO | Source: Ambulatory Visit | Attending: Internal Medicine | Admitting: Internal Medicine

## 2018-02-13 ENCOUNTER — Telehealth: Payer: Self-pay | Admitting: *Deleted

## 2018-02-13 ENCOUNTER — Inpatient Hospital Stay: Payer: PPO | Attending: Internal Medicine

## 2018-02-13 ENCOUNTER — Telehealth: Payer: Self-pay | Admitting: Internal Medicine

## 2018-02-13 DIAGNOSIS — C9110 Chronic lymphocytic leukemia of B-cell type not having achieved remission: Secondary | ICD-10-CM

## 2018-02-13 DIAGNOSIS — R161 Splenomegaly, not elsewhere classified: Secondary | ICD-10-CM | POA: Insufficient documentation

## 2018-02-13 DIAGNOSIS — R599 Enlarged lymph nodes, unspecified: Secondary | ICD-10-CM | POA: Diagnosis not present

## 2018-02-13 DIAGNOSIS — Z79899 Other long term (current) drug therapy: Secondary | ICD-10-CM | POA: Insufficient documentation

## 2018-02-13 DIAGNOSIS — F17210 Nicotine dependence, cigarettes, uncomplicated: Secondary | ICD-10-CM | POA: Diagnosis not present

## 2018-02-13 DIAGNOSIS — I714 Abdominal aortic aneurysm, without rupture: Secondary | ICD-10-CM | POA: Diagnosis not present

## 2018-02-13 DIAGNOSIS — B9620 Unspecified Escherichia coli [E. coli] as the cause of diseases classified elsewhere: Secondary | ICD-10-CM | POA: Diagnosis not present

## 2018-02-13 DIAGNOSIS — N2889 Other specified disorders of kidney and ureter: Secondary | ICD-10-CM | POA: Diagnosis not present

## 2018-02-13 DIAGNOSIS — N12 Tubulo-interstitial nephritis, not specified as acute or chronic: Secondary | ICD-10-CM | POA: Diagnosis not present

## 2018-02-13 DIAGNOSIS — Z9221 Personal history of antineoplastic chemotherapy: Secondary | ICD-10-CM | POA: Insufficient documentation

## 2018-02-13 DIAGNOSIS — C9190 Lymphoid leukemia, unspecified not having achieved remission: Secondary | ICD-10-CM | POA: Insufficient documentation

## 2018-02-13 DIAGNOSIS — E538 Deficiency of other specified B group vitamins: Secondary | ICD-10-CM | POA: Insufficient documentation

## 2018-02-13 DIAGNOSIS — R9389 Abnormal findings on diagnostic imaging of other specified body structures: Secondary | ICD-10-CM

## 2018-02-13 LAB — COMPREHENSIVE METABOLIC PANEL
ALBUMIN: 4.8 g/dL (ref 3.5–5.0)
ALK PHOS: 56 U/L (ref 38–126)
ALT: 13 U/L (ref 0–44)
AST: 18 U/L (ref 15–41)
Anion gap: 10 (ref 5–15)
BILIRUBIN TOTAL: 0.6 mg/dL (ref 0.3–1.2)
BUN: 19 mg/dL (ref 8–23)
CO2: 25 mmol/L (ref 22–32)
Calcium: 9.2 mg/dL (ref 8.9–10.3)
Chloride: 102 mmol/L (ref 98–111)
Creatinine, Ser: 0.67 mg/dL (ref 0.44–1.00)
GFR calc Af Amer: >60 mL/min (ref >60)
GLUCOSE: 100 mg/dL — AB (ref 70–99)
POTASSIUM: 4.2 mmol/L (ref 3.5–5.1)
Sodium: 137 mmol/L (ref 135–145)
TOTAL PROTEIN: 7.3 g/dL (ref 6.5–8.1)

## 2018-02-13 LAB — CBC WITH DIFFERENTIAL/PLATELET
BASOS ABS: 0.1 10*3/uL (ref 0–0.1)
Basophils Relative: 0 %
EOS ABS: 0.2 10*3/uL (ref 0–0.7)
EOS PCT: 1 %
HCT: 40.2 % (ref 35.0–47.0)
Hemoglobin: 12.9 g/dL (ref 12.0–16.0)
LYMPHS PCT: 67 %
Lymphs Abs: 11.3 10*3/uL — ABNORMAL HIGH (ref 1.0–3.6)
MCH: 28.5 pg (ref 26.0–34.0)
MCHC: 32.1 g/dL (ref 32.0–36.0)
MCV: 88.9 fL (ref 80.0–100.0)
MONO ABS: 0.5 10*3/uL (ref 0.2–0.9)
Monocytes Relative: 3 %
Neutro Abs: 4.9 10*3/uL (ref 1.4–6.5)
Neutrophils Relative %: 29 %
Platelets: 219 10*3/uL (ref 150–440)
RBC: 4.52 MIL/uL (ref 3.80–5.20)
RDW: 16 % — AB (ref 11.5–14.5)
WBC: 16.8 10*3/uL — ABNORMAL HIGH (ref 3.6–11.0)

## 2018-02-13 LAB — LACTATE DEHYDROGENASE: LDH: 142 U/L (ref 98–192)

## 2018-02-13 MED ORDER — CIPROFLOXACIN HCL 500 MG PO TABS: 500.0000 mg | ORAL_TABLET | Freq: Two times a day (BID) | ORAL | 0 refills | Status: DC

## 2018-02-13 MED ORDER — IOPAMIDOL (ISOVUE-300) INJECTION 61%
50.0000 mL | Freq: Once | INTRAVENOUS | Status: AC | PRN
  Administered 2018-02-13: 100 mL via INTRAVENOUS

## 2018-02-13 MED ORDER — IOPAMIDOL (ISOVUE-300) INJECTION 61%: 100.0000 mL | Freq: Once | INTRAVENOUS | Status: DC | PRN

## 2018-02-13 NOTE — Telephone Encounter (Signed)
Called report  IMPRESSION: 1. There is patchy enhancement of the right kidney concerning for pyelonephritis. Additionally there is urothelial thickening and hyperenhancement involving the right renal collecting system and ureter compatible with ascending urinary tract infection. 2. Similar-appearing abdominal and retroperitoneal adenopathy. 3. These results will be called to the ordering clinician or representative by the Radiologist Assistant, and communication documented in the PACS or zVision Dashboard.   Electronically Signed   By: Lovey Newcomer M.D.   On: 02/13/2018 13:01

## 2018-02-13 NOTE — Telephone Encounter (Signed)
I tried to reach pt twice at given numbers; left message to call us back.  Sent a prescription for [facility] BID [facility]    Hassan Rowan- please call pt in AM 8/2; to check on above; and also order UA/culture

## 2018-02-13 NOTE — Telephone Encounter (Signed)
  Can someone call the patient and ask about symptoms?  We would need a urinalysis and culture.  If she is sick, go to the ER for evaluation, STAT UA and culture.  It looks like the scan was ordered months ago.   Concern for resistant bacteria if she has recurrent UTIs.  M

## 2018-02-14 ENCOUNTER — Inpatient Hospital Stay: Payer: PPO

## 2018-02-14 ENCOUNTER — Ambulatory Visit: Payer: PPO

## 2018-02-14 DIAGNOSIS — C9190 Lymphoid leukemia, unspecified not having achieved remission: Secondary | ICD-10-CM | POA: Diagnosis not present

## 2018-02-14 DIAGNOSIS — R9389 Abnormal findings on diagnostic imaging of other specified body structures: Secondary | ICD-10-CM

## 2018-02-14 LAB — URINALYSIS, COMPLETE (UACMP) WITH MICROSCOPIC
BILIRUBIN URINE: NEGATIVE
Glucose, UA: NEGATIVE mg/dL
Ketones, ur: NEGATIVE mg/dL
Nitrite: POSITIVE — AB
Protein, ur: 30 mg/dL — AB
SPECIFIC GRAVITY, URINE: 1.01 (ref 1.005–1.030)
WBC, UA: >50 WBC/hpf (ref 0–5)
pH: 6.5 (ref 5.0–8.0)

## 2018-02-14 NOTE — Telephone Encounter (Signed)
I spoke with patient and informed her of CT results and that prescription was sent to [facility]. She agrees to come in for UA, C&S this morning

## 2018-02-16 LAB — URINE CULTURE: Culture: >=100000 — AB

## 2018-02-18 ENCOUNTER — Encounter: Payer: Self-pay | Admitting: Internal Medicine

## 2018-02-18 ENCOUNTER — Inpatient Hospital Stay (HOSPITAL_BASED_OUTPATIENT_CLINIC_OR_DEPARTMENT_OTHER): Payer: PPO | Admitting: Internal Medicine

## 2018-02-18 VITALS — BP 157/71 | HR 65 | Temp 97.1°F | Resp 16 | Wt 113.3 lb

## 2018-02-18 DIAGNOSIS — Z79899 Other long term (current) drug therapy: Secondary | ICD-10-CM | POA: Diagnosis not present

## 2018-02-18 DIAGNOSIS — N12 Tubulo-interstitial nephritis, not specified as acute or chronic: Secondary | ICD-10-CM

## 2018-02-18 DIAGNOSIS — B9620 Unspecified Escherichia coli [E. coli] as the cause of diseases classified elsewhere: Secondary | ICD-10-CM

## 2018-02-18 DIAGNOSIS — R161 Splenomegaly, not elsewhere classified: Secondary | ICD-10-CM

## 2018-02-18 DIAGNOSIS — E538 Deficiency of other specified B group vitamins: Secondary | ICD-10-CM

## 2018-02-18 DIAGNOSIS — C9190 Lymphoid leukemia, unspecified not having achieved remission: Secondary | ICD-10-CM

## 2018-02-18 DIAGNOSIS — C9110 Chronic lymphocytic leukemia of B-cell type not having achieved remission: Secondary | ICD-10-CM

## 2018-02-18 DIAGNOSIS — F17210 Nicotine dependence, cigarettes, uncomplicated: Secondary | ICD-10-CM | POA: Diagnosis not present

## 2018-02-18 DIAGNOSIS — Z9221 Personal history of antineoplastic chemotherapy: Secondary | ICD-10-CM | POA: Diagnosis not present
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
## 2018-02-18 NOTE — Assessment & Plan Note (Addendum)
#   CLL/  recurrent stage IV- FISH Positive for 13 Q del.status post s/p Gazyva #6 cycles [last treatment 01/23/2017].  Clinically stable.  August 2019 CT scan shows-stable mild adenopathy mild splenomegaly.  No progression noted.  #UTI/right kidney pyelonephritis-urine culture positive for E. coli on ciprofloxacin.  Symptoms improving.  Monitor closely.  # follow up in 3 months/labs/MD  # I reviewed the blood work- with the patient in detail; also reviewed the imaging independently [as summarized above]; and with the patient in detail.

## 2018-02-25 ENCOUNTER — Ambulatory Visit: Payer: PPO | Admitting: Internal Medicine

## 2018-02-25 ENCOUNTER — Other Ambulatory Visit: Payer: PPO

## 2018-02-25 ENCOUNTER — Ambulatory Visit: Payer: PPO

## 2018-05-20 ENCOUNTER — Inpatient Hospital Stay: Payer: PPO | Attending: Internal Medicine | Admitting: Internal Medicine

## 2018-05-20 ENCOUNTER — Inpatient Hospital Stay: Payer: PPO

## 2018-05-20 VITALS — BP 117/80 | HR 80 | Temp 97.5°F | Resp 16 | Wt 117.5 lb

## 2018-05-20 DIAGNOSIS — Z9221 Personal history of antineoplastic chemotherapy: Secondary | ICD-10-CM | POA: Insufficient documentation

## 2018-05-20 DIAGNOSIS — C9110 Chronic lymphocytic leukemia of B-cell type not having achieved remission: Secondary | ICD-10-CM

## 2018-05-20 DIAGNOSIS — R109 Unspecified abdominal pain: Secondary | ICD-10-CM | POA: Diagnosis not present

## 2018-05-20 DIAGNOSIS — F17210 Nicotine dependence, cigarettes, uncomplicated: Secondary | ICD-10-CM

## 2018-05-20 DIAGNOSIS — R350 Frequency of micturition: Secondary | ICD-10-CM | POA: Insufficient documentation

## 2018-05-20 DIAGNOSIS — Z79899 Other long term (current) drug therapy: Secondary | ICD-10-CM | POA: Diagnosis not present

## 2018-05-20 LAB — CBC WITH DIFFERENTIAL/PLATELET
Abs Immature Granulocytes: 0.04 10*3/uL (ref 0.00–0.07)
BASOS PCT: 0 %
Basophils Absolute: 0 10*3/uL (ref 0.0–0.1)
EOS ABS: 0.1 10*3/uL (ref 0.0–0.5)
EOS PCT: 1 %
HEMATOCRIT: 42.7 % (ref 36.0–46.0)
Hemoglobin: 13.3 g/dL (ref 12.0–15.0)
Immature Granulocytes: 0 %
LYMPHS ABS: 12 10*3/uL — AB (ref 0.7–4.0)
Lymphocytes Relative: 68 %
MCH: 29.2 pg (ref 26.0–34.0)
MCHC: 31.1 g/dL (ref 30.0–36.0)
MCV: 93.8 fL (ref 80.0–100.0)
MONO ABS: 1.3 10*3/uL — AB (ref 0.1–1.0)
MONOS PCT: 8 %
Neutro Abs: 3.9 10*3/uL (ref 1.7–7.7)
Neutrophils Relative %: 23 %
PLATELETS: 130 10*3/uL — AB (ref 150–400)
RBC: 4.55 MIL/uL (ref 3.87–5.11)
RDW: 14 % (ref 11.5–15.5)
WBC: 17.5 10*3/uL — ABNORMAL HIGH (ref 4.0–10.5)
nRBC: 0 % (ref 0.0–0.2)

## 2018-05-20 LAB — COMPREHENSIVE METABOLIC PANEL
ALBUMIN: 4.6 g/dL (ref 3.5–5.0)
ALK PHOS: 49 U/L (ref 38–126)
ALT: 14 U/L (ref 0–44)
AST: 21 U/L (ref 15–41)
Anion gap: 10 (ref 5–15)
BILIRUBIN TOTAL: 0.3 mg/dL (ref 0.3–1.2)
BUN: 21 mg/dL (ref 8–23)
CALCIUM: 8.8 mg/dL — AB (ref 8.9–10.3)
CO2: 27 mmol/L (ref 22–32)
CREATININE: 0.64 mg/dL (ref 0.44–1.00)
Chloride: 103 mmol/L (ref 98–111)
GFR calc Af Amer: >60 mL/min (ref >60)
GFR calc non Af Amer: >60 mL/min (ref >60)
GLUCOSE: 100 mg/dL — AB (ref 70–99)
Potassium: 3.8 mmol/L (ref 3.5–5.1)
Sodium: 140 mmol/L (ref 135–145)
TOTAL PROTEIN: 6.7 g/dL (ref 6.5–8.1)

## 2018-05-20 LAB — LACTATE DEHYDROGENASE: LDH: 128 U/L (ref 98–192)
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
## 2018-05-20 NOTE — Assessment & Plan Note (Addendum)
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   #Clinically stable however white count slightly rising 17,000 today hemoglobin stable at 15 platelets 139.  No signs of overt clinical progression.  #I discussed with the patient regarding options with ibrutinib/which would be indefinite therapy versus finite therapy Rituxan-Venatoclax [[place] data].  We will repeat fish testing.  However we will continue monitor for now.  #DISPOSITION: # follow up in 3 months/labs/MD- dr.B  # 25 minutes face-to-face with the patient discussing the above plan of care; more than 50% of time spent on prognosis/ natural history; counseling and coordination.

## 2018-06-05 IMAGING — CT CT ABD-PELV W/ CM
1 of 3 series · 10 of 32 positions shown, 16 images · IV contrast (iopamidol)
Comparison: 07/24/2016 CT chest, abdomen and pelvis.

CLINICAL DATA: CLL, relapsed.  Restaging.

EXAM:
CT CHEST, ABDOMEN, AND PELVIS WITH CONTRAST
TECHNIQUE: Multidetector CT imaging of the chest, abdomen and pelvis was
performed following the standard protocol during bolus
administration of intravenous contrast.
CONTRAST:  100mL S8P1MK-SKK IOPAMIDOL (S8P1MK-SKK) INJECTION 61%

[Series 2: cap with · axial · 0.62mm/px · z∈[-1102,-597]mm · 10 of 123 slices shown, 16 images]
[im 11/123  soft-tissue]
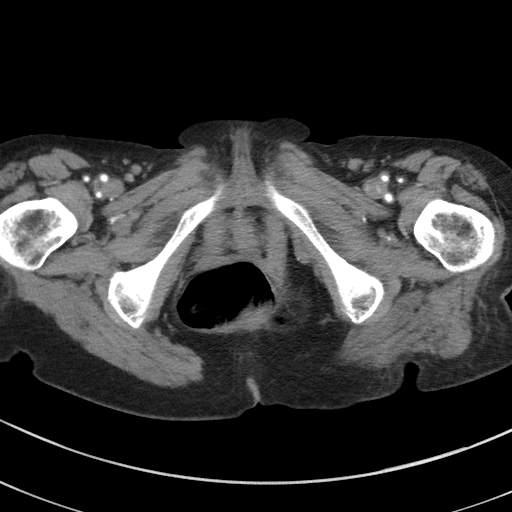
[im 11/123  bone]
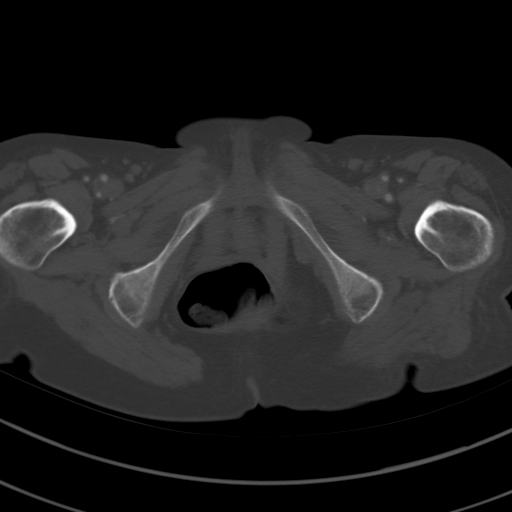
[im 21/123  soft-tissue]
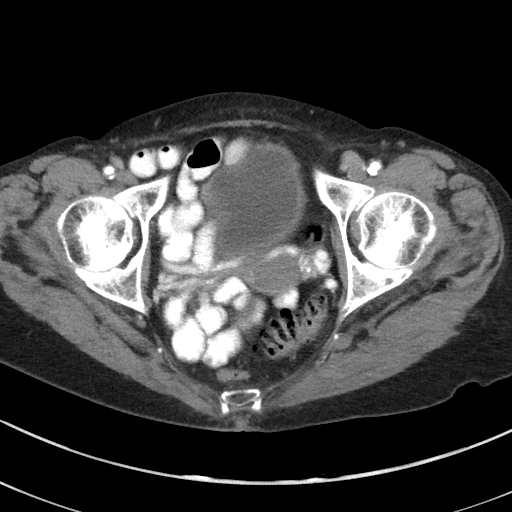
[im 31/123  soft-tissue]
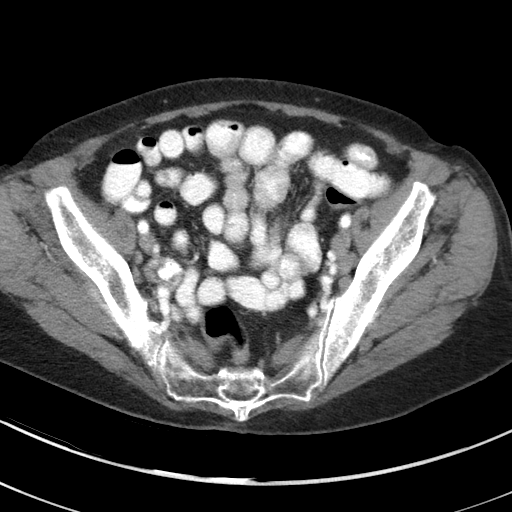
[im 41/123  soft-tissue]
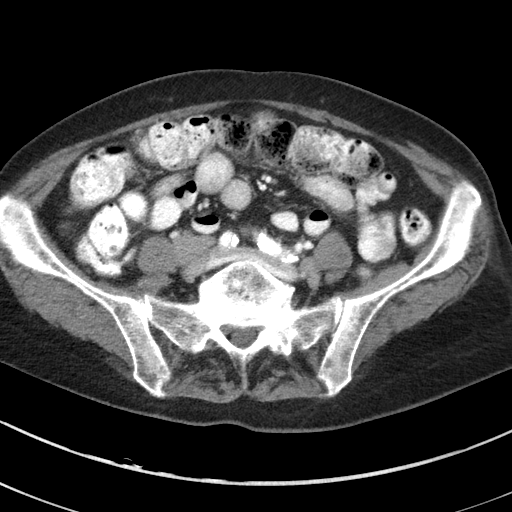
[im 51/123  soft-tissue]
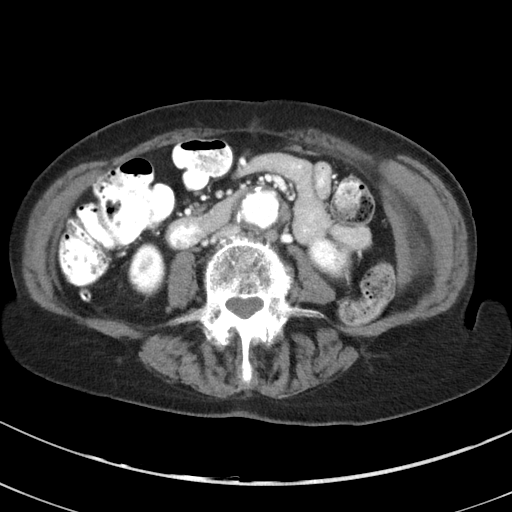
[im 72/123  soft-tissue]
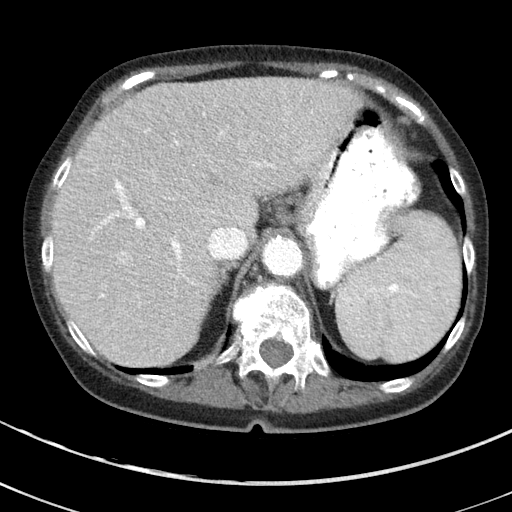
[im 82/123  soft-tissue]
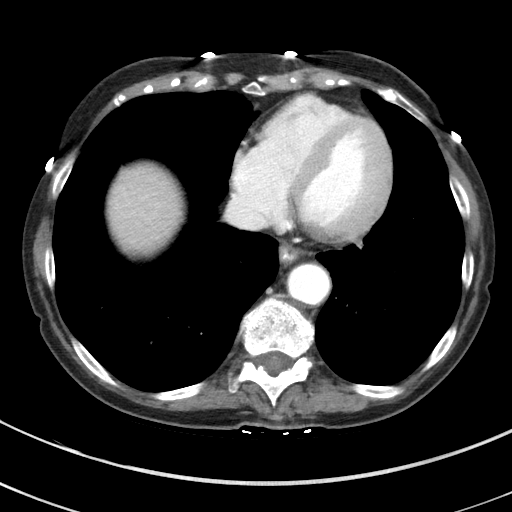
[im 82/123  lung]
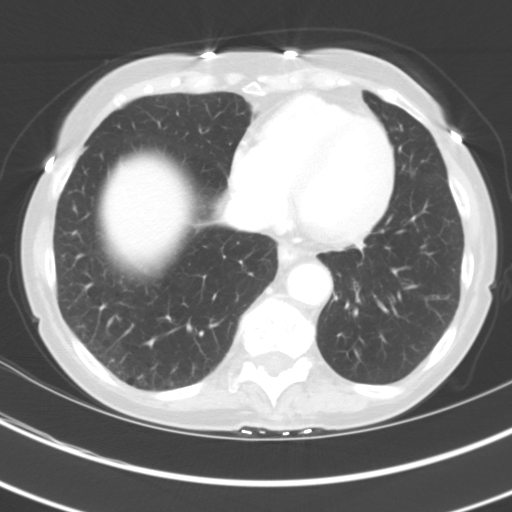
[im 92/123  soft-tissue]
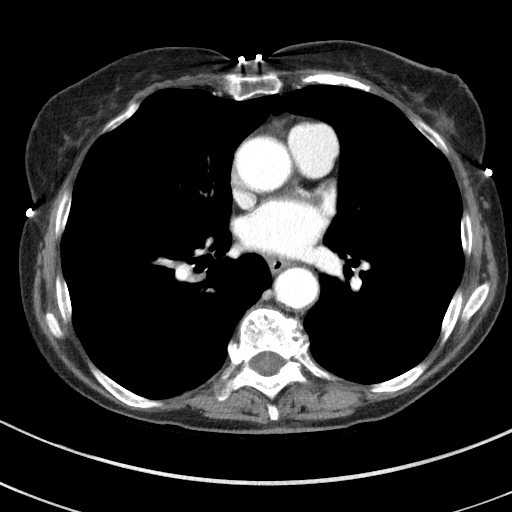
[im 92/123  lung]
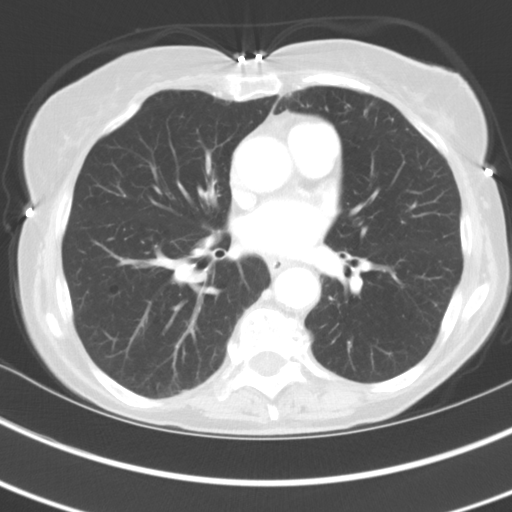
[im 102/123  soft-tissue]
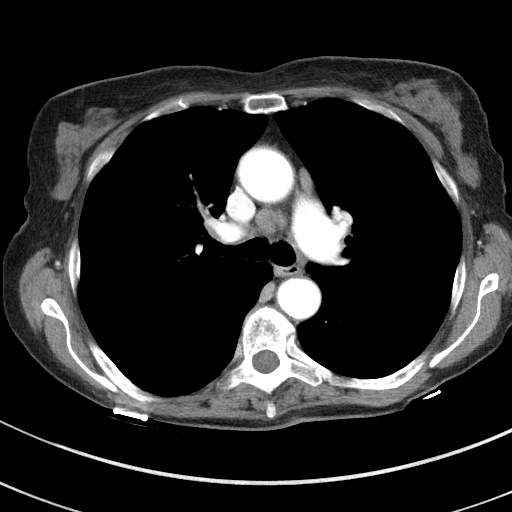
[im 102/123  lung]
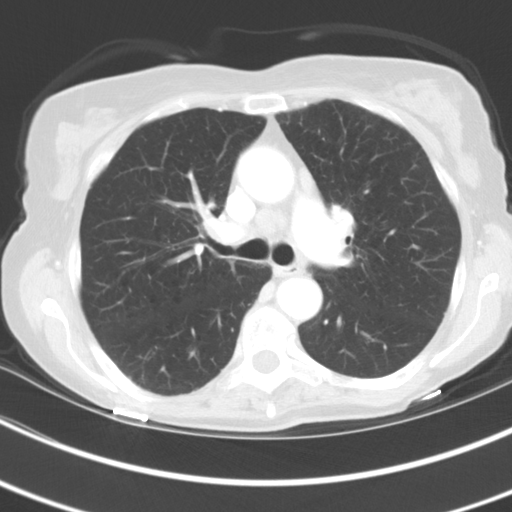
[im 102/123  bone]
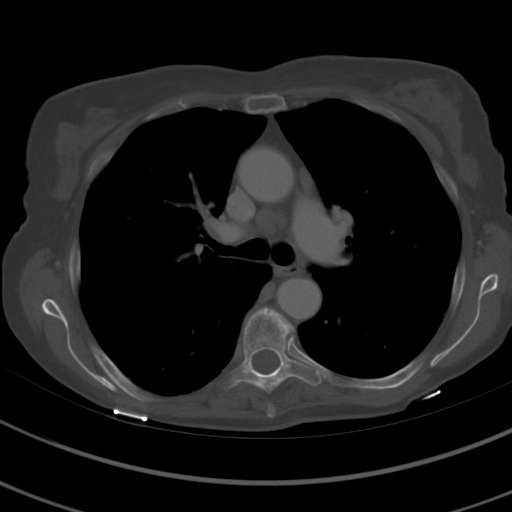
[im 112/123  soft-tissue]
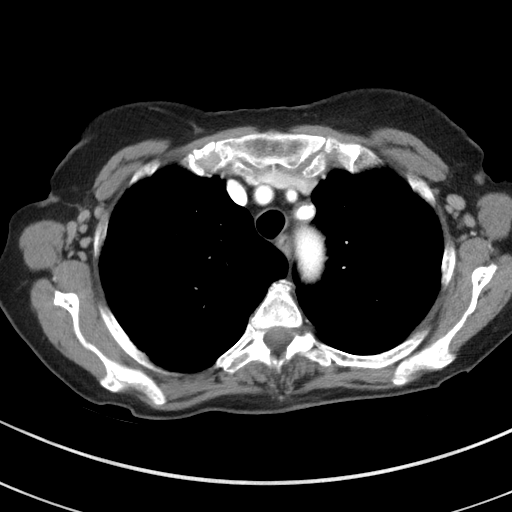
[im 112/123  lung]
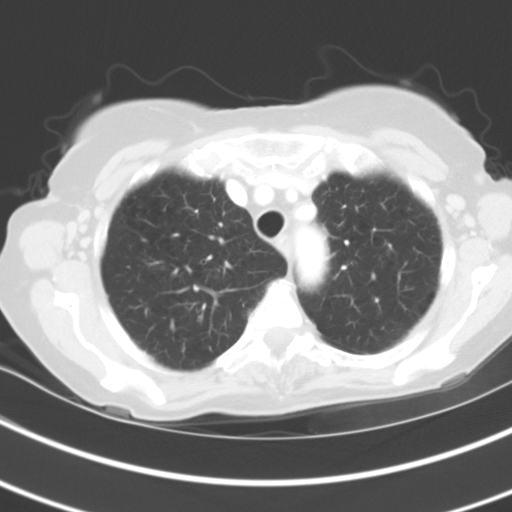

[10 of 32 positions shown; findings below may reference images not displayed]

FINDINGS: CT CHEST FINDINGS

Cardiovascular: Normal heart size. No significant pericardial
fluid/thickening. Atherosclerotic nonaneurysmal thoracic aorta.
Normal caliber pulmonary arteries. No central pulmonary emboli.

Mediastinum/Nodes: Dominant partially calcified hypodense 1.3 cm
left thyroid lobe nodule, unchanged. Unremarkable esophagus.
Bilateral axillary adenopathy is decreased. Mildly enlarged 1.0 cm
right axillary node (series 2/image 8), decreased from 1.7 cm. No
additional pathologically enlarged axillary nodes. Mild right
paratracheal adenopathy is decreased. Representative 1.2 cm mildly
enlarged right paratracheal node (series 2/image 22), previously
cm, decreased. Mildly enlarged 1.4 cm subcarinal node (series
2/image 27), decreased from 2.4 cm. No new enlarged mediastinal
nodes. Mildly enlarged 1.5 cm right hilar node (series 2/image 24),
decreased from 2.1 cm. No additional residual pathologically
enlarged axillary nodes.

Lungs/Pleura: No pneumothorax. No pleural effusion. Moderate
centrilobular emphysema with mild diffuse bronchial wall thickening.
Posterior right upper lobe 4 mm solid pulmonary nodule (series
4/image 35) is stable and considered benign. No acute consolidative
airspace disease, lung masses or new significant pulmonary nodules.
Minimal patchy tree-in-bud opacity in the anterior left upper lobe
(series 4/image 75) is new and compatible with minimal bronchiolitis
of uncertain clinical significance.

Musculoskeletal: No aggressive appearing focal osseous lesions.
Moderate thoracic spondylosis.

CT ABDOMEN PELVIS FINDINGS

Hepatobiliary: Normal liver with no liver mass. Normal gallbladder
with no radiopaque cholelithiasis. No biliary ductal dilatation.

Pancreas: Normal, with no mass or duct dilation.

Spleen: Mildly enlarged spleen (craniocaudal length 13.0 cm,
significantly decreased from 19.7 cm). Coarse calcifications
throughout the spleen are stable. Hypodense anterior splenic 1.0 cm
lesion (series 2/image 50), decreased from 2.0 cm. No new splenic
masses.

Adrenals/Urinary Tract: Normal adrenals. There is a new 3.4 x 2.6 cm
focus of hypoenhancement in the posterior upper right renal cortex
(series 7/image 19). No additional significant renal lesions. No
hydronephrosis. Normal bladder.

Stomach/Bowel: Normal non-distended stomach. Normal caliber small
bowel with no small bowel wall thickening. Normal appendix. Moderate
sigmoid diverticulosis, with no large bowel wall thickening or
pericolonic fat stranding. Oral contrast transits to the left colon.

Vascular/Lymphatic: Atherosclerotic abdominal aorta with stable
cm infrarenal abdominal aortic aneurysm. Patent portal, splenic,
hepatic and renal veins. Mild gastrohepatic ligament lymphadenopathy
is decreased, for example a 1.2 cm gastrohepatic ligament node
(series 2/image 55), decreased from 2.1 cm. Mild porta hepatis and
portacaval adenopathy is decreased, for example a 1.4 cm porta
hepatis node (series 2/image 57), decreased from 2.7 cm. Mild right
mesenteric adenopathy is significantly decreased, for example a
cm right mesenteric node (series 2/image 65), decreased from 2.3 cm.
Aortocaval and left para-aortic adenopathy is decreased, for example
a 1.0 cm left para-aortic node (series 2/image 67), decreased from
1.6 cm. Bilateral common and external iliac lymphadenopathy is
decreased. Representative 1.7 cm right common iliac node (series
2/image 83), decreased from 2.4 cm. Representative 1.4 cm left
external iliac node (series 2/image 98), decreased from 2.2 cm. No
newly enlarged abdominopelvic nodes.

Reproductive: Grossly normal uterus.  No adnexal mass.

Other: No pneumoperitoneum, ascites or focal fluid collection.

Musculoskeletal: No aggressive appearing focal osseous lesions.
Marked lumbar spondylosis.
IMPRESSION: 1. Significant partial treatment response. Mild axillary,
mediastinal, hilar, retroperitoneal, mesenteric and bilateral pelvic
adenopathy, all significantly decreased in the interval. Mild
splenomegaly, significantly decreased.
2. New 3.4 cm masslike focus of hypoenhancement in the posterior
upper right kidney, indeterminate. Differential includes focal acute
pyelonephritis or renal cell carcinoma. If there are clinical
findings suspicious for pyelonephritis, recommend short-term post
antibiotic therapy follow-up imaging with CT abdomen without and
with IV contrast. If there are no clinical findings suspicious for
pyelonephritis, recommend further evaluation at this time with MRI
abdomen without and with IV contrast.
3. Stable 3.1 cm infrarenal abdominal aortic aneurysm. Recommend
followup by ultrasound in 3 years. This recommendation follows ACR
consensus guidelines: White Paper of the ACR Incidental Findings
Committee II on Vascular Findings. [HOSPITAL] [id number];
[DATE].
4. Aortic Atherosclerosis (3265H-960.0) and Emphysema (3265H-LCR.B).

These results were called by telephone at the time of interpretation
on 10/17/2017 at [DATE] to Dr. BOUMERDES GOU , who verbally
acknowledged these results.

## 2018-08-19 ENCOUNTER — Encounter: Payer: Self-pay | Admitting: Internal Medicine

## 2018-08-19 ENCOUNTER — Other Ambulatory Visit: Payer: Self-pay

## 2018-08-19 ENCOUNTER — Inpatient Hospital Stay: Payer: PPO

## 2018-08-19 ENCOUNTER — Inpatient Hospital Stay: Payer: PPO | Attending: Internal Medicine | Admitting: Internal Medicine

## 2018-08-19 VITALS — BP 162/92 | HR 76 | Temp 97.9°F | Resp 20 | Wt 119.0 lb

## 2018-08-19 DIAGNOSIS — C9110 Chronic lymphocytic leukemia of B-cell type not having achieved remission: Secondary | ICD-10-CM

## 2018-08-19 DIAGNOSIS — R591 Generalized enlarged lymph nodes: Secondary | ICD-10-CM | POA: Insufficient documentation

## 2018-08-19 DIAGNOSIS — Z79899 Other long term (current) drug therapy: Secondary | ICD-10-CM | POA: Insufficient documentation

## 2018-08-19 DIAGNOSIS — F17210 Nicotine dependence, cigarettes, uncomplicated: Secondary | ICD-10-CM | POA: Diagnosis not present

## 2018-08-19 DIAGNOSIS — K0889 Other specified disorders of teeth and supporting structures: Secondary | ICD-10-CM | POA: Diagnosis not present

## 2018-08-19 DIAGNOSIS — M542 Cervicalgia: Secondary | ICD-10-CM | POA: Diagnosis not present

## 2018-08-19 LAB — CBC WITH DIFFERENTIAL/PLATELET
Abs Immature Granulocytes: 0.05 10*3/uL (ref 0.00–0.07)
BASOS ABS: 0.1 10*3/uL (ref 0.0–0.1)
BASOS PCT: 0 %
EOS ABS: 0.1 10*3/uL (ref 0.0–0.5)
Eosinophils Relative: 0 %
HCT: 42 % (ref 36.0–46.0)
Hemoglobin: 13.2 g/dL (ref 12.0–15.0)
Immature Granulocytes: 0 %
Lymphocytes Relative: 85 %
Lymphs Abs: 23.2 10*3/uL — ABNORMAL HIGH (ref 0.7–4.0)
MCH: 30.3 pg (ref 26.0–34.0)
MCHC: 31.4 g/dL (ref 30.0–36.0)
MCV: 96.6 fL (ref 80.0–100.0)
MONOS PCT: 4 %
Monocytes Absolute: 1.2 10*3/uL — ABNORMAL HIGH (ref 0.1–1.0)
NEUTROS PCT: 11 %
NRBC: 0 % (ref 0.0–0.2)
Neutro Abs: 3 10*3/uL (ref 1.7–7.7)
PLATELETS: 147 10*3/uL — AB (ref 150–400)
RBC: 4.35 MIL/uL (ref 3.87–5.11)
RDW: 14.2 % (ref 11.5–15.5)
WBC: 27.6 10*3/uL — AB (ref 4.0–10.5)

## 2018-08-19 LAB — COMPREHENSIVE METABOLIC PANEL
ALK PHOS: 56 U/L (ref 38–126)
ALT: 17 U/L (ref 0–44)
ANION GAP: 9 (ref 5–15)
AST: 22 U/L (ref 15–41)
Albumin: 4.4 g/dL (ref 3.5–5.0)
BILIRUBIN TOTAL: 1 mg/dL (ref 0.3–1.2)
BUN: 18 mg/dL (ref 8–23)
CO2: 27 mmol/L (ref 22–32)
Calcium: 8.9 mg/dL (ref 8.9–10.3)
Chloride: 107 mmol/L (ref 98–111)
Creatinine, Ser: 0.7 mg/dL (ref 0.44–1.00)
Glucose, Bld: 103 mg/dL — ABNORMAL HIGH (ref 70–99)
Potassium: 4.2 mmol/L (ref 3.5–5.1)
Sodium: 143 mmol/L (ref 135–145)
TOTAL PROTEIN: 6.6 g/dL (ref 6.5–8.1)

## 2018-08-19 LAB — LACTATE DEHYDROGENASE: LDH: 147 U/L (ref 98–192)

## 2018-08-19 MED ORDER — AMOXICILLIN 500 MG PO CAPS: 500.0000 mg | ORAL_CAPSULE | Freq: Three times a day (TID) | ORAL | 0 refills | Status: DC

## 2018-08-19 NOTE — Progress Notes (Signed)
[place] OFFICE PROGRESS NOTE  Patient Care Team: Juline Patch, MD as PCP - General (Family Medicine) Earnestine Leys, MD (Specialist) Cammie Sickle, MD as Consulting Physician (Internal Medicine)  Cancer Staging No matching staging information was found for the patient.   Oncology History   # 2002- CLL [Dx- in [place] 2010- FCR x 4 cycles [Dr.Pandit]; Normal karyotype; PR; AUG 2012- Single agent Rituxan x7 [held sec to neutropenia];   # June 2015- Progression based on lymphocytosis- surveillance; Feb 2017- FISH- 13 q del/ IGVH MUTATED [good prognostic] s/p Gazyva x6 cyles [6th cycle on [id number]]   # Hx of skin rash [2012 s/p Bx-ant thigh lesion-vesicular spongiotic dermatitis]-resolved; B12 def [2012]  ------------------------------------------------   DIAGNOSIS:CLL  STAGE:   IV   ;GOALS: control  CURRENT/MOST RECENT THERAPY; surveillance      CLL (chronic lymphocytic leukemia) ([place])      INTERVAL HISTORY:  Maureen [name] 78 y.o.  female pleasant patient above history of CLL currently on surveillance is here for follow-up.  Patient complains of right-sided neck pain/lump.  She also complains of pain in the right upper tooth.  Denies any pus.  Started about 2 days ago.  Denies any abdominal pain or nausea vomiting.  Denies any back pain.  Any chills.  No fevers.  No significant night sweats.  Review of Systems  Constitutional: Negative for chills, diaphoresis, fever, malaise/fatigue and weight loss.  HENT: Negative for nosebleeds and sore throat.   Eyes: Negative for double vision.  Respiratory: Negative for cough, hemoptysis, sputum production, shortness of breath and wheezing.   Cardiovascular: Negative for chest pain, palpitations, orthopnea and leg swelling.  Gastrointestinal: Negative for abdominal pain, blood in stool, constipation, diarrhea, heartburn, melena, nausea and vomiting.  Genitourinary: Negative for dysuria, frequency  and urgency.  Musculoskeletal: Negative for back pain and joint pain.  Skin: Negative.  Negative for itching and rash.  Neurological: Negative for dizziness, tingling, focal weakness, weakness and headaches.  Endo/Heme/Allergies: Does not bruise/bleed easily.  Psychiatric/Behavioral: Negative for depression. The patient is not nervous/anxious and does not have insomnia.       PAST MEDICAL HISTORY :  Past Medical History:  Diagnosis Date  . Anemia   . CLL (chronic lymphocytic leukemia) ([place]) 2002  . CLL (chronic lymphocytic leukemia) ([place])   . Itching   . Vitamin B 12 deficiency     PAST SURGICAL HISTORY :   Past Surgical History:  Procedure Laterality Date  . breeast mass removed  1988   benign    FAMILY HISTORY :   Family History  Family history unknown: Yes    SOCIAL HISTORY:   Social History   Tobacco Use  . Smoking status: Current Every Day Smoker    Packs/day: 0.50    Years: 56.00    Pack years: 28.00    Types: Cigarettes  . Smokeless tobacco: Never Used  Substance Use Topics  . Alcohol use: Yes    Alcohol/week: 1.0 standard drinks    Types: 1 Glasses of wine per week    Comment: occassional  . Drug use: No    ALLERGIES:  has No Known Allergies.  MEDICATIONS:  Current Outpatient Medications  Medication Sig Dispense Refill  . ferrous sulfate 325 (65 FE) MG tablet Take 325 mg by mouth daily with breakfast.    . folic acid (FOLVITE) 301 MCG tablet Take 800 mcg by mouth daily.     . Ibuprofen 200 MG CAPS Take 1 capsule by  mouth as needed (PAIN).    Marland Kitchen amoxicillin (AMOXIL) 500 MG capsule Take 1 capsule (500 mg total) by mouth 3 (three) times daily. 21 capsule 0   No current facility-administered medications for this visit.     PHYSICAL EXAMINATION: ECOG PERFORMANCE STATUS: 0 - Asymptomatic  BP (!) 162/92 (Patient Position: Sitting)   Pulse 76   Temp 97.9 F (36.6 C) (Tympanic)   Resp 20   Wt 119 lb 0.8 oz (54 kg)   BMI 18.65 kg/m   Filed  Weights   08/19/18 1001  Weight: 119 lb 0.8 oz (54 kg)    Physical Exam  Constitutional: She is oriented to person, place, and time and well-developed, well-nourished, and in no distress.  Alone.   HENT:  Head: Normocephalic and atraumatic.  Mouth/Throat: Oropharynx is clear and moist. No oropharyngeal exudate.  Approximately 2 cm right cervical/submandibular lymph node tender.  Poor dentition noted.  No pus.   Eyes: Pupils are equal, round, and reactive to light.  Neck: Normal range of motion. Neck supple.  Cardiovascular: Normal rate and regular rhythm.  Pulmonary/Chest: No respiratory distress. She has no wheezes.  Bilateral axillary lymph node 1 to 2 cm left more than right lymph nodes/rubbery.  Abdominal: Soft. Bowel sounds are normal. She exhibits no distension and no mass. There is no abdominal tenderness. There is no rebound and no guarding.  Musculoskeletal: Normal range of motion.        General: No tenderness or edema.  Neurological: She is alert and oriented to person, place, and time.  Skin: Skin is warm.  Psychiatric: Affect normal.       LABORATORY DATA:  I have reviewed the data as listed    Component Value Date/Time   NA 143 08/19/2018 0945   NA 144 03/26/2013 0823   K 4.2 08/19/2018 0945   K 4.0 03/26/2013 0823   CL 107 08/19/2018 0945   CL 106 03/26/2013 0823   CO2 27 08/19/2018 0945   CO2 28 03/26/2013 0823   GLUCOSE 103 (H) 08/19/2018 0945   GLUCOSE 96 03/26/2013 0823   BUN 18 08/19/2018 0945   BUN 12 03/26/2013 0823   CREATININE 0.70 08/19/2018 0945   CREATININE 0.64 12/17/2013 0947   CALCIUM 8.9 08/19/2018 0945   CALCIUM 8.6 03/26/2013 0823   PROT 6.6 08/19/2018 0945   PROT 6.6 12/17/2013 0947   ALBUMIN 4.4 08/19/2018 0945   ALBUMIN 4.0 12/17/2013 0947   AST 22 08/19/2018 0945   AST 19 12/17/2013 0947   ALT 17 08/19/2018 0945   ALT 26 12/17/2013 0947   ALKPHOS 56 08/19/2018 0945   ALKPHOS 63 12/17/2013 0947   BILITOT 1.0 08/19/2018 0945    BILITOT 0.4 12/17/2013 0947   GFRNONAA >60 08/19/2018 0945   GFRNONAA >60 09/24/2013 0854   GFRAA >60 08/19/2018 0945   GFRAA >60 09/24/2013 0854    No results found for: SPEP, UPEP  Lab Results  Component Value Date   WBC 27.6 (H) 08/19/2018   NEUTROABS 3.0 08/19/2018   HGB 13.2 08/19/2018   HCT 42.0 08/19/2018   MCV 96.6 08/19/2018   PLT 147 (L) 08/19/2018      Chemistry      Component Value Date/Time   NA 143 08/19/2018 0945   NA 144 03/26/2013 0823   K 4.2 08/19/2018 0945   K 4.0 03/26/2013 0823   CL 107 08/19/2018 0945   CL 106 03/26/2013 0823   CO2 27 08/19/2018 0945   CO2 28  03/26/2013 0823   BUN 18 08/19/2018 0945   BUN 12 03/26/2013 0823   CREATININE 0.70 08/19/2018 0945   CREATININE 0.64 12/17/2013 0947      Component Value Date/Time   CALCIUM 8.9 08/19/2018 0945   CALCIUM 8.6 03/26/2013 0823   ALKPHOS 56 08/19/2018 0945   ALKPHOS 63 12/17/2013 0947   AST 22 08/19/2018 0945   AST 19 12/17/2013 0947   ALT 17 08/19/2018 0945   ALT 26 12/17/2013 0947   BILITOT 1.0 08/19/2018 0945   BILITOT 0.4 12/17/2013 0947       RADIOGRAPHIC STUDIES: I have personally reviewed the radiological images as listed and agreed with the findings in the report. No results found.   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   #Clinically stable however white count-27,000 today hemoglobin stable at 15 platelets 147.  No signs of overt clinical progression [see discussion below].  Await repeat fish panel.  Discussed regarding treatment options including ibrutinib versus Rituxan-Venatoclax.  Continue surveillance for now.  #Toothache/right neck swelling/pain-right side lymphadenopathy suspect infectious/reactive.  Recommend amoxicillin 500 TID x 7 days.  If not improved recommend evaluation with dentistry/PCP  #DISPOSITION: # follow up in  1 months;Dr.C/labs-cbc/bmp/LDH- Dr.B.    No orders of the defined types were placed in this encounter.  All questions were answered. The patient knows to call the clinic with any problems, questions or concerns.      Cammie Sickle, MD 08/19/2018 11:55 AM

## 2018-08-19 NOTE — Assessment & Plan Note (Addendum)
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   #Clinically stable however white count-27,000 today hemoglobin stable at 15 platelets 147.  No signs of overt clinical progression [see discussion below].  Await repeat fish panel.  Discussed regarding treatment options including ibrutinib versus Rituxan-Venatoclax.  Continue surveillance for now.  #Toothache/right neck swelling/pain-right side lymphadenopathy suspect infectious/reactive.  Recommend amoxicillin 500 TID x 7 days.  If not improved recommend evaluation with dentistry/PCP  #DISPOSITION: # follow up in 1 months;Dr.C/labs-cbc/bmp/LDH- Dr.B.

## 2018-08-19 NOTE — Addendum Note (Signed)
Addended by: Sandria Bales B on: 08/19/2018 02:42 PM   Modules accepted: Orders

## 2018-08-21 LAB — IMMUNOGLOBULINS A/E/G/M, SERUM
IgA: 21 mg/dL — ABNORMAL LOW (ref 64–422)
IgE (Immunoglobulin E), Serum: <2 IU/mL — ABNORMAL LOW (ref 6–495)
IgG (Immunoglobin G), Serum: 306 mg/dL — ABNORMAL LOW (ref 700–1600)
IgM (Immunoglobulin M), Srm: 42 mg/dL (ref 26–217)

## 2018-08-30 LAB — FISH HES LEUKEMIA, 4Q12 REA

## 2018-09-23 ENCOUNTER — Ambulatory Visit: Payer: PPO | Admitting: Hematology and Oncology

## 2018-09-23 ENCOUNTER — Other Ambulatory Visit: Payer: PPO

## 2018-09-24 ENCOUNTER — Other Ambulatory Visit: Payer: Self-pay

## 2018-09-24 ENCOUNTER — Telehealth: Payer: Self-pay

## 2018-09-24 ENCOUNTER — Other Ambulatory Visit: Payer: Self-pay | Admitting: Hematology and Oncology

## 2018-09-24 ENCOUNTER — Inpatient Hospital Stay: Payer: PPO | Attending: Hematology and Oncology

## 2018-09-24 ENCOUNTER — Inpatient Hospital Stay: Payer: PPO | Admitting: Hematology and Oncology

## 2018-09-24 ENCOUNTER — Encounter: Payer: Self-pay | Admitting: Hematology and Oncology

## 2018-09-24 VITALS — BP 129/72 | HR 74 | Temp 98.6°F | Resp 20 | Wt 124.2 lb

## 2018-09-24 DIAGNOSIS — D649 Anemia, unspecified: Secondary | ICD-10-CM | POA: Insufficient documentation

## 2018-09-24 DIAGNOSIS — E538 Deficiency of other specified B group vitamins: Secondary | ICD-10-CM | POA: Insufficient documentation

## 2018-09-24 DIAGNOSIS — D696 Thrombocytopenia, unspecified: Secondary | ICD-10-CM

## 2018-09-24 DIAGNOSIS — R634 Abnormal weight loss: Secondary | ICD-10-CM | POA: Diagnosis not present

## 2018-09-24 DIAGNOSIS — F17210 Nicotine dependence, cigarettes, uncomplicated: Secondary | ICD-10-CM | POA: Diagnosis not present

## 2018-09-24 DIAGNOSIS — Z7189 Other specified counseling: Secondary | ICD-10-CM

## 2018-09-24 DIAGNOSIS — Z9221 Personal history of antineoplastic chemotherapy: Secondary | ICD-10-CM

## 2018-09-24 DIAGNOSIS — C9110 Chronic lymphocytic leukemia of B-cell type not having achieved remission: Secondary | ICD-10-CM

## 2018-09-24 DIAGNOSIS — K047 Periapical abscess without sinus: Secondary | ICD-10-CM

## 2018-09-24 DIAGNOSIS — D509 Iron deficiency anemia, unspecified: Secondary | ICD-10-CM

## 2018-09-24 DIAGNOSIS — I714 Abdominal aortic aneurysm, without rupture: Secondary | ICD-10-CM | POA: Diagnosis not present

## 2018-09-24 LAB — COMPREHENSIVE METABOLIC PANEL
ALK PHOS: 47 U/L (ref 38–126)
ALT: 17 U/L (ref 0–44)
AST: 23 U/L (ref 15–41)
Albumin: 4 g/dL (ref 3.5–5.0)
Anion gap: 7 (ref 5–15)
BUN: 18 mg/dL (ref 8–23)
CO2: 26 mmol/L (ref 22–32)
Calcium: 9 mg/dL (ref 8.9–10.3)
Chloride: 108 mmol/L (ref 98–111)
Creatinine, Ser: 0.73 mg/dL (ref 0.44–1.00)
GFR calc Af Amer: >60 mL/min (ref >60)
GFR calc non Af Amer: >60 mL/min (ref >60)
Glucose, Bld: 118 mg/dL — ABNORMAL HIGH (ref 70–99)
Potassium: 4.6 mmol/L (ref 3.5–5.1)
Sodium: 141 mmol/L (ref 135–145)
Total Bilirubin: 0.9 mg/dL (ref 0.3–1.2)
Total Protein: 6 g/dL — ABNORMAL LOW (ref 6.5–8.1)

## 2018-09-24 LAB — CBC WITH DIFFERENTIAL/PLATELET
Abs Immature Granulocytes: 0.03 10*3/uL (ref 0.00–0.07)
Basophils Absolute: 0 10*3/uL (ref 0.0–0.1)
Basophils Relative: 0 %
EOS ABS: 0.1 10*3/uL (ref 0.0–0.5)
Eosinophils Relative: 1 %
HCT: 39.1 % (ref 36.0–46.0)
Hemoglobin: 12.3 g/dL (ref 12.0–15.0)
Immature Granulocytes: 0 %
Lymphocytes Relative: 85 %
Lymphs Abs: 23 10*3/uL — ABNORMAL HIGH (ref 0.7–4.0)
MCH: 30.2 pg (ref 26.0–34.0)
MCHC: 31.5 g/dL (ref 30.0–36.0)
MCV: 96.1 fL (ref 80.0–100.0)
MONO ABS: 1.7 10*3/uL — AB (ref 0.1–1.0)
Monocytes Relative: 6 %
Neutro Abs: 2.1 10*3/uL (ref 1.7–7.7)
Neutrophils Relative %: 8 %
Platelets: 107 10*3/uL — ABNORMAL LOW (ref 150–400)
RBC: 4.07 MIL/uL (ref 3.87–5.11)
RDW: 14.4 % (ref 11.5–15.5)
WBC: 27 10*3/uL — ABNORMAL HIGH (ref 4.0–10.5)
nRBC: 0 % (ref 0.0–0.2)

## 2018-09-24 LAB — FOLATE: Folate: 36 ng/mL (ref >5.9)

## 2018-09-24 LAB — FERRITIN: Ferritin: 200 ng/mL (ref 11–307)

## 2018-09-24 LAB — IRON AND TIBC
Iron: 100 ug/dL (ref 28–170)
Saturation Ratios: 49 % — ABNORMAL HIGH (ref 10.4–31.8)
TIBC: 206 ug/dL — ABNORMAL LOW (ref 250–450)
UIBC: 106 ug/dL

## 2018-09-24 LAB — VITAMIN B12: Vitamin B-12: 237 pg/mL (ref 180–914)

## 2018-09-24 LAB — LACTATE DEHYDROGENASE: LDH: 183 U/L (ref 98–192)

## 2018-09-24 NOTE — Progress Notes (Signed)
[facility] day:  09/24/2018  Chief Complaint: Maureen [name] is a 78 y.o. female with recurrent stage IV CLL who is seen for new patient assessment.  HPI:   The patient was diagnosed with CLL in [place] in 2002.  She presented with fatigue.  She is unaware of her initial blood counts.  She underwent bone marrow in [place] (no report available).  She was initially under surveillance.  She developed progressive disease.  Patient states that she was losing weight.  Bone marrow on 03/2008 revealed 90% cellularity with diffuse involvement by CLL.  Karyotype was normal.  She completed FCR x 4 cycles in 08/06/2008 with Dr Ma Hillock.  She had a partial remission.  With treatment she developed near alopecia with no return of hair growth except a small patch.  She was again under surveillance.  She received single agent Rituxan x 7.  Treatment was held secondary to neutropenia.  She developed progressive lymphocytosis in 12/2013.  She continued surveillance.  FISH studies on 08/19/2018 revealed loss of one ATM, homologous deletion of 13q, and normal CCDN1/IGH, chromosome 12, and TP53.  She received 6 cycles of Gazyva (07/26/2016 - 01/23/2017).  Chest, abdomen, and pelvic CT on 10/17/2017 revealed significant partial treatment response. There was mild axillary, mediastinal, hilar, retroperitoneal, mesenteric and bilateral pelvic adenopathy, all significantly decreased in the interval.  There was mild splenomegaly (13 cm), significantly decreased.  There was a new 3.4 cm masslike focus of hypoenhancement in the posterior upper right kidney, indeterminate. There was a stable 3.1 cm infrarenal abdominal aortic aneurysm  Abdomen and pelvis CT on 02/13/2018 revealed patchy enhancement of the right kidney concerning for pyelonephritis. Additionally there was urothelial thickening and hyperenhancement involving the right renal collecting system and ureter compatible with  ascending urinary tract infection.  There was similar-appearing abdominal and retroperitoneal adenopathy.  She has a history of a skin rash s/p anterior thigh biopsy in 02/22/2011 by Dr. Ree Edman.  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 There was extensive subepidermal edema. In the underlying dermis there was  a superficial and deep predominantly perivascular but also interstitial infiltrate composed of numerous lymphocytes and eosinophils along with red blood cell extravasation   No flame figures were identified. No histologic evidence of leukocytoclastic vasculitis. Presence of vesicular spongiosis with the underlying infiltrate raised the differential diagnosis of a vesicular arthropod bite reaction. Other histologic possibilities include an early Wells syndrome although the findings were not diagnostic. Hypereosinophilic syndrome could produce similar features.  A drug reaction was also in the differential diagnosis. PAS/F stain was negative for fungal hyphae.   Skin lesions were treated with a course of doxycycline, topical Clobetasol, and hydroxyzine for itching.  She states that she gets the rash on her legs in the summertime.  She was diagnosed with B12 deficiency in 03/2011.  Intrinsic factor antibody was negative.  She is not taking B12.  She has been taking oral iron and folic acid since prescribed by Dr Ma Hillock in 2009.  The patient was last seen by Dr. Tish Men on 08/19/2017.  At that time, she complained of a painful right sided neck and right upper tooth pain x 2 days.  Exam revealed poor dentition and a 2 cm right cervical/submandibular tender node.  In addition, there was bilateral 1-2 cm left > right axillary nodes.  Hemoglobin was 15, WBC 27,000, and platelets 147,000.  She was prescribed amoxicillin 500 mg TID x  7 days.  Symptomatically, she denies any B symptoms.  She is aware of neck adenopathy.   The lymph nodes that were enlarged at last visit and associated with a dental infection have improved.  She had that tooth pulled (upper right).  She denies any other infections.  She denies any bruising or bleeding.  Her biggest concern is her joint aches (hands, ankles, knees).   Past Medical History:  Diagnosis Date  . Anemia   . CLL (chronic lymphocytic leukemia) ([place]) 2002  . CLL (chronic lymphocytic leukemia) ([place])   . Itching   . Vitamin B 12 deficiency     Past Surgical History:  Procedure Laterality Date  . breeast mass removed  1988   benign    Family History  Family history unknown: Yes    Social History:  reports that she has been smoking cigarettes. She has a 28.00 pack-year smoking history. She has never used smokeless tobacco. She reports current alcohol use of about 1.0 standard drinks of alcohol per week. She reports that she does not use drugs.  She drinks a glass of wine with dinner "once in awhile".  She stopped smoking 3 weeks ago.  She started smoking at age 33 and smoked 10-15 cigarettes/day.  She is from [place].  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  Her husband would often do tours of duty in [place].  The patient is alone today.  Allergies: No Known Allergies  Current Medications: Current Outpatient Medications  Medication Sig Dispense Refill  . ferrous sulfate 325 (65 FE) MG tablet Take 325 mg by mouth daily with breakfast.    . folic acid (FOLVITE) 130 MCG tablet Take 800 mcg by mouth daily.     . Ibuprofen 200 MG CAPS Take 1 capsule by mouth as needed (PAIN).     No current facility-administered medications for this visit.     Review of Systems:  GENERAL:  Feels good.  No fevers, sweats or weight loss.  Weight gain of 5 pounds since 08/2018. PERFORMANCE STATUS (ECOG):  1 HEENT:  No dental pain.  No visual changes, runny nose, sore throat, mouth sores or tenderness. Lungs: No shortness of breath or cough.  No hemoptysis. Cardiac:   No chest pain, palpitations, orthopnea, or PND. GI:  No nausea, vomiting, diarrhea, constipation, melena or hematochezia. GU:  No urgency, frequency, dysuria, or hematuria. Musculoskeletal:  No back pain.  Joint pain (hands, knees, ankles).  Left knee swollen.  No muscle tenderness. Extremities:  Fingers swollen. Skin: Lower extremity rash in the summertime.  No new rashes or skin changes. Neuro:  "Sometimes I lose my words".  No headache, numbness or weakness, balance or coordination issues. Endocrine:  No diabetes, thyroid issues, hot flashes or night sweats. Psych:  No mood changes, depression or anxiety. Pain:  No focal pain. Review of systems:  All other systems reviewed and found to be negative.  Physical Exam: Blood pressure 129/72, pulse 74, temperature 98.6 F (37 C), temperature source Tympanic, resp. rate 20, weight 124 lb 3.7 oz (56.4 kg), SpO2 97 %. GENERAL:  Well developed, well nourished, woman sitting comfortably in the exam room in no acute distress. MENTAL STATUS:  Alert and oriented to person, place and time. HEAD:  Wearing a black head wrap.  Small patch of brown hair right of midline. Normocephalic, atraumatic, face symmetric, no Cushingoid features. EYES:  Blue eyes.  Pupils equal round and reactive to light and accomodation.  No  conjunctivitis or scleral icterus. ENT:  Oropharynx clear without lesion.  Tongue normal.  Missing few teeth.  Mucous membranes moist.  RESPIRATORY:  Clear to auscultation without rales, wheezes or rhonchi. CARDIOVASCULAR:  Regular rate and rhythm without murmur, rub or gallop. ABDOMEN:  Soft, non-tender, with active bowel sounds, and no hepatosplenomegaly.  No masses. SKIN:  Somewhat mottled lower extremities (chronic per patient).  Slight palmar erythema.  No ulcers or lesions. EXTREMITIES:  Fingers edematous.  Tender ankles.  No edema,.  No palpable cords. LYMPH NODES:  Small (< 1 cm) right anterior cervical lymph node.  Left supraclavicular  adenopathy (several nodes <= 1 cm).  Bilateral < 1 - 2 cm axillary adenopathy (left > right).  Bilateral < 1 cm inguinal adenopathy (left > right).  NEUROLOGICAL: Unremarkable. PSYCH:  Appropriate.   Appointment on 09/24/2018  Component Date Value Ref Range Status  . LDH 09/24/2018 183  98 - 192 U/L Final   Performed at [facility], [address], [place], [place] [address]  . Sodium 09/24/2018 141  135 - 145 mmol/L Final  . Potassium 09/24/2018 4.6  3.5 - 5.1 mmol/L Final  . Chloride 09/24/2018 108  98 - 111 mmol/L Final  . CO2 09/24/2018 26  22 - 32 mmol/L Final  . Glucose, Bld 09/24/2018 118* 70 - 99 mg/dL Final  . BUN 09/24/2018 18  8 - 23 mg/dL Final  . Creatinine, Ser 09/24/2018 0.73  0.44 - 1.00 mg/dL Final  . Calcium 09/24/2018 9.0  8.9 - 10.3 mg/dL Final  . Total Protein 09/24/2018 6.0* 6.5 - 8.1 g/dL Final  . Albumin 09/24/2018 4.0  3.5 - 5.0 g/dL Final  . AST 09/24/2018 23  15 - 41 U/L Final  . ALT 09/24/2018 17  0 - 44 U/L Final  . Alkaline Phosphatase 09/24/2018 47  38 - 126 U/L Final  . Total Bilirubin 09/24/2018 0.9  0.3 - 1.2 mg/dL Final  . GFR calc non Af Amer 09/24/2018 >60  >60 mL/min Final  . GFR calc Af Amer 09/24/2018 >60  >60 mL/min Final  . Anion gap 09/24/2018 7  5 - 15 Final   Performed at [facility], [address], [place], [place] [address]  . WBC 09/24/2018 27.0* 4.0 - 10.5 K/uL Final  . RBC 09/24/2018 4.07  3.87 - 5.11 MIL/uL Final  . Hemoglobin 09/24/2018 12.3  12.0 - 15.0 g/dL Final  . HCT 09/24/2018 39.1  36.0 - 46.0 % Final  . MCV 09/24/2018 96.1  80.0 - 100.0 fL Final  . MCH 09/24/2018 30.2  26.0 - 34.0 pg Final  . MCHC 09/24/2018 31.5  30.0 - 36.0 g/dL Final  . RDW 09/24/2018 14.4  11.5 - 15.5 % Final  . Platelets 09/24/2018 107* 150 - 400 K/uL Final   Comment: Immature Platelet Fraction may be clinically indicated, consider ordering this additional test [id number]   . nRBC 09/24/2018 0.0  0.0 - 0.2 % Final  .  Neutrophils Relative % 09/24/2018 8  % Final  . Neutro Abs 09/24/2018 2.1  1.7 - 7.7 K/uL Final  . Lymphocytes Relative 09/24/2018 85  % Final  . Lymphs Abs 09/24/2018 23.0* 0.7 - 4.0 K/uL Final  . Monocytes Relative 09/24/2018 6  % Final  . Monocytes Absolute 09/24/2018 1.7* 0.1 - 1.0 K/uL Final  . Eosinophils Relative 09/24/2018 1  % Final  . Eosinophils Absolute 09/24/2018 0.1  0.0 - 0.5 K/uL Final  . Basophils Relative 09/24/2018 0  %  Final  . Basophils Absolute 09/24/2018 0.0  0.0 - 0.1 K/uL Final  . Immature Granulocytes 09/24/2018 0  % Final  . Abs Immature Granulocytes 09/24/2018 0.03  0.00 - 0.07 K/uL Final   Performed at [facility], [address], [place], [place] [address]    Assessment:  Maureen [name] is a 78 y.o. female with stage IV CLL.  She was diagnosed in [place] in 2002.  Bone marrow on 03/2008 revealed 90% cellularity with diffuse involvement by CLL.  Karyotype was normal.  FISH studies on 08/19/2018 revealed loss of one ATM, homologous deletion of 13q, and normal CCDN1/IGH, chromosome 12, and TP53.  She received FCR x 4 cycles (last 08/06/2008). She had a partial remission.  She received single agent Rituxan x 7.  Treatment was held secondary to neutropenia.  She received 6 cycles of Gazyva (07/26/2016 - 01/23/2017).  Chest, abdomen, and pelvic CT on 10/17/2017 revealed significant partial treatment response. There was mild axillary, mediastinal, hilar, retroperitoneal, mesenteric and bilateral pelvic adenopathy, all significantly decreased in the interval.  There was mild splenomegaly (13 cm), significantly decreased.  There was a new 3.4 cm masslike focus of hypoenhancement in the posterior upper right kidney, indeterminate. There was a stable 3.1 cm infrarenal abdominal aortic aneurysm  Abdomen and pelvic CT on 02/13/2018 revealed patchy enhancement of the right kidney concerning for pyelonephritis. Additionally there was urothelial thickening  and hyperenhancement involving the right renal collecting system and ureter compatible with ascending urinary tract infection.  There was similar-appearing abdominal and retroperitoneal adenopathy.  She has a history of a skin rash s/p anterior thigh biopsy in 2012.  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   She was ttreated with a course of doxycycline, topical Clobetasol, and hydroxyzine for itching.  She states that she gets the rash on her legs in the summertime.  She was diagnosed with B12 deficiency in 2012.  She is not on oral B12.  She has been taking oral iron and folic acid since prescribed by Dr Ma Hillock in 2009.  Symptomatically, she denies any B symptoms.  Tender adenopathy associated with her dental infection has resolved.  She notes joint pain.  Exam reveals no splenomegaly, but diffuse small adenopathy (cervical, supraclavicular, axillary and inguinal).  Hemoglobin is 39.1.  WBC is 27,000.  Platelet count is 107,000.  Plan: 1.   Labs today:  CBC with diff, CMP, LDH, ferritin, iron studies, B12, folate. 2.   Chronic lymphocytic leukemia (CLL)  Discuss entire medical history, diagnosis and treatment of CLL to date.  Discuss diffuse small adenopathy and recurrent thrombocytopenia.  Discuss etiologies of thrombocytopenia    Thrombocytopenia asociated with CLL:  autoimmune/ITP like, splenomegaly, and marrow replacement.    Patient has had low platelet count associated with her disease on several occasions    Patient denies any new medications or herbal products.   Doubt pseudothrombocytopenia.  Discuss plan for recheck of platelets in 2 weeks.  Patient to contact clinic if any increased bruising or bleeding.  Discuss indications for treatment:    B symptoms, progressive anemia or thrombocytopenia (platelets < 100,000), bulky disease, threatened organ function.  If treatment needed, briefly discussed consideration of  ibrutinib. 3.   History of B12 deficiency  Patient not on B12 supplementation, but on folic acid supplementation.  Check B12 and folate level. 4.   History of anemia  Hemoglobin is 12.3.  MCV is 96.1.  Patient has been on oral iron x  11 years  Check ferritin and iron studies.  If iron studies normal, will discontinue oral iron. 5.  RTC in 2 weeks for labs (CBC with diff, PT, PTT). 6.  RTC in 1 month for MD assessment and labs (CBC with diff, CMP, LDH, uric acid).   Lequita Asal, MD  09/24/2018, 11:10 AM

## 2018-09-24 NOTE — Telephone Encounter (Signed)
-----   Message from Lequita Asal, MD sent at 09/24/2018  3:33 PM EDT ----- Regarding: Please call patient  B12 is low.  Start B12 1000 mcg po q day.  We will recheck level in 1 month.  M ----- Message ----- From: Buel Ream, [facility] [place] Sent: 09/24/2018   2:55 PM EDT To: Lequita Asal, MD

## 2018-09-24 NOTE — Telephone Encounter (Signed)
Spoke with Maureen [name] to inform her that her B-12 levels were low with labs today, Per Dr Mike Gip she will like for Maureen [name] to start taking oral B-12 1,000 mcg 1 tab po daily. Maureen [name] was understanding and agreeable to start Maureen oral B-12 and have labs recheck in 1 month. Maureen [name] already has an appointment noted on 4 / 8 / 2020. Labs has been added for that day. Maureen [name] does not need to add addition encounter for a labs visit.

## 2018-09-24 NOTE — Progress Notes (Signed)
Pt here for follow up. Patient complains of edema and pain to joints including bilateral hands, elbows, knees, and ankles x 3 days. Denies any other concerns at this time.

## 2018-10-08 ENCOUNTER — Other Ambulatory Visit: Payer: PPO

## 2018-10-21 ENCOUNTER — Other Ambulatory Visit: Payer: Self-pay

## 2018-10-21 ENCOUNTER — Inpatient Hospital Stay: Payer: PPO | Attending: Hematology and Oncology

## 2018-10-21 DIAGNOSIS — C9110 Chronic lymphocytic leukemia of B-cell type not having achieved remission: Secondary | ICD-10-CM

## 2018-10-21 DIAGNOSIS — E538 Deficiency of other specified B group vitamins: Secondary | ICD-10-CM

## 2018-10-21 DIAGNOSIS — C9190 Lymphoid leukemia, unspecified not having achieved remission: Secondary | ICD-10-CM | POA: Diagnosis not present

## 2018-10-21 DIAGNOSIS — D696 Thrombocytopenia, unspecified: Secondary | ICD-10-CM

## 2018-10-21 LAB — CBC WITH DIFFERENTIAL/PLATELET
Abs Immature Granulocytes: 0.06 10*3/uL (ref 0.00–0.07)
Basophils Absolute: 0.1 10*3/uL (ref 0.0–0.1)
Basophils Relative: 0 %
Eosinophils Absolute: 0.3 10*3/uL (ref 0.0–0.5)
Eosinophils Relative: 1 %
HCT: 41 % (ref 36.0–46.0)
Hemoglobin: 12.8 g/dL (ref 12.0–15.0)
Immature Granulocytes: 0 %
Lymphocytes Relative: 88 %
Lymphs Abs: 35 10*3/uL — ABNORMAL HIGH (ref 0.7–4.0)
MCH: 30.5 pg (ref 26.0–34.0)
MCHC: 31.2 g/dL (ref 30.0–36.0)
MCV: 97.6 fL (ref 80.0–100.0)
Monocytes Absolute: 2 10*3/uL — ABNORMAL HIGH (ref 0.1–1.0)
Monocytes Relative: 5 %
Neutro Abs: 2.2 10*3/uL (ref 1.7–7.7)
Neutrophils Relative %: 6 %
Platelets: 112 10*3/uL — ABNORMAL LOW (ref 150–400)
RBC: 4.2 MIL/uL (ref 3.87–5.11)
RDW: 15.1 % (ref 11.5–15.5)
WBC: 39.7 10*3/uL — ABNORMAL HIGH (ref 4.0–10.5)
nRBC: 0 % (ref 0.0–0.2)

## 2018-10-21 LAB — PROTIME-INR
INR: 1 (ref 0.8–1.2)
Prothrombin Time: 12.6 seconds (ref 11.4–15.2)

## 2018-10-21 LAB — COMPREHENSIVE METABOLIC PANEL
ALT: 14 U/L (ref 0–44)
AST: 20 U/L (ref 15–41)
Albumin: 4.6 g/dL (ref 3.5–5.0)
Alkaline Phosphatase: 51 U/L (ref 38–126)
Anion gap: 6 (ref 5–15)
BUN: 24 mg/dL — ABNORMAL HIGH (ref 8–23)
CO2: 25 mmol/L (ref 22–32)
Calcium: 8.6 mg/dL — ABNORMAL LOW (ref 8.9–10.3)
Chloride: 109 mmol/L (ref 98–111)
Creatinine, Ser: 0.53 mg/dL (ref 0.44–1.00)
GFR calc Af Amer: >60 mL/min (ref >60)
GFR calc non Af Amer: >60 mL/min (ref >60)
Glucose, Bld: 101 mg/dL — ABNORMAL HIGH (ref 70–99)
Potassium: 4.2 mmol/L (ref 3.5–5.1)
Sodium: 140 mmol/L (ref 135–145)
Total Bilirubin: 0.5 mg/dL (ref 0.3–1.2)
Total Protein: 6.6 g/dL (ref 6.5–8.1)

## 2018-10-21 LAB — APTT: aPTT: 30 seconds (ref 24–36)

## 2018-10-21 LAB — VITAMIN B12: Vitamin B-12: 570 pg/mL (ref 180–914)

## 2018-10-21 LAB — LACTATE DEHYDROGENASE: LDH: 162 U/L (ref 98–192)

## 2018-10-21 LAB — URIC ACID: Uric Acid, Serum: 4 mg/dL (ref 2.5–7.1)

## 2018-10-22 ENCOUNTER — Inpatient Hospital Stay (HOSPITAL_BASED_OUTPATIENT_CLINIC_OR_DEPARTMENT_OTHER): Payer: PPO | Admitting: Hematology and Oncology

## 2018-10-22 ENCOUNTER — Ambulatory Visit: Payer: PPO | Admitting: Hematology and Oncology

## 2018-10-22 ENCOUNTER — Other Ambulatory Visit: Payer: PPO

## 2018-10-22 ENCOUNTER — Encounter: Payer: Self-pay | Admitting: Hematology and Oncology

## 2018-10-22 DIAGNOSIS — R790 Abnormal level of blood mineral: Secondary | ICD-10-CM | POA: Insufficient documentation

## 2018-10-22 DIAGNOSIS — C9110 Chronic lymphocytic leukemia of B-cell type not having achieved remission: Secondary | ICD-10-CM

## 2018-10-22 DIAGNOSIS — Z79899 Other long term (current) drug therapy: Secondary | ICD-10-CM | POA: Diagnosis not present

## 2018-10-22 DIAGNOSIS — E538 Deficiency of other specified B group vitamins: Secondary | ICD-10-CM

## 2018-10-22 NOTE — Progress Notes (Signed)
[facility]  [address] [place], [place] [address] Phone: [phone]  Fax: [phone]   Telephone Office Visit:  10/22/2018   Referring physician:  Juline Patch, MD   I connected with Maureen [name] on 10/22/18 at 2:17 PM EDT by telephone and verified that I was speaking with the correct person using 2 identifiers.  The patient was at home.  I discussed the limitations, risk, security and privacy concerns of performing an evaluation and management service by telephone and the availability of in person appointments.  I also discussed with the patient that there may be a patient responsible charge related to this service.  The patient expressed understanding and agreed to proceed.    Chief Complaint: Maureen [name] is a 78 y.o. female with recurrent stage IV CLL who is evaluated for 1 month assessment.  HPI:   The patient was last seen in the medical oncology clinic on 09/24/2018.  At that time, she denied any B symptoms.  Tender adenopathy associated with her dental infection had resolved.  She noted joint pain.  Exam revealed no splenomegaly, but diffuse small adenopathy (cervical, supraclavicular, axillary and inguinal).   Hemoglobin was 39.1.  WBC was 27,000.  [place] 2100.  ALC 23,000.  Platelet count was 107,000  Additional labs included a ferritin of 200, iron saturation 49% with a TIBC of 206.  B12 was 237 (low), folate of 36, and LDH 183 (normal).  Protein was 6.0 (low).  She was contacted regarding her low B12.  Oral B12 1000 mcg was started on 09/24/2018.  During the interim, she states that she stopped oral iron for 1 week secondary to constipation.  She started drinking prune juice.  She remains on Z30 and folic acid.  She notes that her finger swelling has decreased.  She denies any melena, hematochezia, hematuria or vaginal bleeding.   Past Medical History:  Diagnosis Date  . Anemia   . CLL (chronic lymphocytic leukemia) ([place]) 2002   . CLL (chronic lymphocytic leukemia) ([place])   . Itching   . Vitamin B 12 deficiency     Past Surgical History:  Procedure Laterality Date  . breeast mass removed  1988   benign    Family History  Family history unknown: Yes    Social History:  reports that she has been smoking cigarettes. She has a 28.00 pack-year smoking history. She has never used smokeless tobacco. She reports current alcohol use of about 1.0 standard drinks of alcohol per week. She reports that she does not use drugs.  She drinks a glass of wine with dinner "once in awhile".  She stopped smoking 3 weeks ago.  She started smoking at age 62 and smoked 10-15 cigarettes/day.  She is from [place].  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  Her husband would often do tours of duty in [place].  Participants in the patient's visit included the patient and Maureen Budge, RN, today.  The intake visit was provided by Maureen Budge, RN.   Allergies: No Known Allergies  Current Medications: Current Outpatient Medications  Medication Sig Dispense Refill  . ferrous sulfate 325 (65 FE) MG tablet Take 325 mg by mouth daily with breakfast.    . folic acid (FOLVITE) 865 MCG tablet Take 800 mcg by mouth daily.     . Ibuprofen 200 MG CAPS Take 1 capsule by mouth as needed (PAIN).    Marland Kitchen vitamin B-12 (CYANOCOBALAMIN) 1000  MCG tablet Take 1,000 mcg by mouth daily.     No current facility-administered medications for this visit.     Review of Systems:  GENERAL:  Feels good.  No fevers, sweats or weight loss. PERFORMANCE STATUS (ECOG):  1 HEENT:  No visual changes, runny nose, sore throat, mouth sores or tenderness. Lungs: No shortness of breath or cough.  No hemoptysis. Cardiac:  No chest pain, palpitations, orthopnea, or PND. GI:  Constipation with oral iron.  No nausea, vomiting, diarrhea, melena or hematochezia. GU:  No urgency, frequency, dysuria, or hematuria. Musculoskeletal:  No back pain.  Finger  swelling decreased.  Hands normal again.  No muscle tenderness. Extremities:  No pain or swelling. Skin:  No rashes or skin changes. Neuro:  No headache, numbness or weakness, balance or coordination issues. Endocrine:  No diabetes, thyroid issues, hot flashes or night sweats. Psych:  No mood changes, depression or anxiety. Pain:  No focal pain. Review of systems:  All other systems reviewed and found to be negative.     Appointment on 10/21/2018  Component Date Value Ref Range Status  . Vitamin B-12 10/21/2018 570  180 - 914 pg/mL Final   Comment: (NOTE) This assay is not validated for testing neonatal or myeloproliferative syndrome specimens for Vitamin B12 levels. Performed at [facility], [place] [address], [place], [place] [address]   . Uric Acid, Serum 10/21/2018 4.0  2.5 - 7.1 mg/dL Final   Performed at [facility], [address], [place], [place] [address]  . LDH 10/21/2018 162  98 - 192 U/L Final   Performed at [facility], [address], [place], [place] [address]  . Sodium 10/21/2018 140  135 - 145 mmol/L Final  . Potassium 10/21/2018 4.2  3.5 - 5.1 mmol/L Final  . Chloride 10/21/2018 109  98 - 111 mmol/L Final  . CO2 10/21/2018 25  22 - 32 mmol/L Final  . Glucose, Bld 10/21/2018 101* 70 - 99 mg/dL Final  . BUN 10/21/2018 24* 8 - 23 mg/dL Final  . Creatinine, Ser 10/21/2018 0.53  0.44 - 1.00 mg/dL Final  . Calcium 10/21/2018 8.6* 8.9 - 10.3 mg/dL Final  . Total Protein 10/21/2018 6.6  6.5 - 8.1 g/dL Final  . Albumin 10/21/2018 4.6  3.5 - 5.0 g/dL Final  . AST 10/21/2018 20  15 - 41 U/L Final  . ALT 10/21/2018 14  0 - 44 U/L Final  . Alkaline Phosphatase 10/21/2018 51  38 - 126 U/L Final  . Total Bilirubin 10/21/2018 0.5  0.3 - 1.2 mg/dL Final  . GFR calc non Af Amer 10/21/2018 >60  >60 mL/min Final  . GFR calc Af Amer 10/21/2018 >60  >60 mL/min Final  . Anion gap 10/21/2018 6  5 - 15 Final   Performed at [facility], [address], [place], [place] [address]  . WBC 10/21/2018 39.7* 4.0 - 10.5 K/uL Final  . RBC 10/21/2018 4.20  3.87 - 5.11 MIL/uL Final  . Hemoglobin 10/21/2018 12.8  12.0 - 15.0 g/dL Final  . HCT 10/21/2018 41.0  36.0 - 46.0 % Final  . MCV 10/21/2018 97.6  80.0 - 100.0 fL Final  . MCH 10/21/2018 30.5  26.0 - 34.0 pg Final  . MCHC 10/21/2018 31.2  30.0 - 36.0 g/dL Final  . RDW 10/21/2018 15.1  11.5 - 15.5 % Final  . Platelets 10/21/2018 112* 150 - 400 K/uL Final   Comment: Immature Platelet  Fraction may be clinically indicated, consider ordering this additional test [id number]   . nRBC 10/21/2018 0.0  0.0 - 0.2 % Final  . Neutrophils Relative % 10/21/2018 6  % Final  . Neutro Abs 10/21/2018 2.2  1.7 - 7.7 K/uL Final  . Lymphocytes Relative 10/21/2018 88  % Final  . Lymphs Abs 10/21/2018 35.0* 0.7 - 4.0 K/uL Final  . Monocytes Relative 10/21/2018 5  % Final  . Monocytes Absolute 10/21/2018 2.0* 0.1 - 1.0 K/uL Final  . Eosinophils Relative 10/21/2018 1  % Final  . Eosinophils Absolute 10/21/2018 0.3  0.0 - 0.5 K/uL Final  . Basophils Relative 10/21/2018 0  % Final  . Basophils Absolute 10/21/2018 0.1  0.0 - 0.1 K/uL Final  . Immature Granulocytes 10/21/2018 0  % Final  . Abs Immature Granulocytes 10/21/2018 0.06  0.00 - 0.07 K/uL Final   Performed at [facility], [address], [place], [place] [address]  . aPTT 10/21/2018 30  24 - 36 seconds Final   Performed at [facility], [place] [place], [place] [address]  . Prothrombin Time 10/21/2018 12.6  11.4 - 15.2 seconds Final  . INR 10/21/2018 1.0  0.8 - 1.2 Final   Comment: (NOTE) INR goal varies based on device and disease states. Performed at [facility], [address] [place],  [address]     Assessment:  JAKARI JACOT is a 78 y.o. female with stage IV CLL.  She was diagnosed in [place] in 2002.  Bone marrow on 03/2008 revealed 90% cellularity with diffuse involvement by  CLL.  Karyotype was normal.  FISH studies on 08/19/2018 revealed loss of one ATM, homologous deletion of 13q, and normal CCDN1/IGH, chromosome 12, and TP53.  She received FCR x 4 cycles (last 08/06/2008). She had a partial remission.  She received single agent Rituxan x 7.  Treatment was held secondary to neutropenia.  She received 6 cycles of Gazyva (07/26/2016 - 01/23/2017).  Chest, abdomen, and pelvic CT on 10/17/2017 revealed significant partial treatment response. There was mild axillary, mediastinal, hilar, retroperitoneal, mesenteric and bilateral pelvic adenopathy, all significantly decreased in the interval.  There was mild splenomegaly (13 cm), significantly decreased.  There was a new 3.4 cm masslike focus of hypoenhancement in the posterior upper right kidney, indeterminate. There was a stable 3.1 cm infrarenal abdominal aortic aneurysm  Abdomen and pelvic CT on 02/13/2018 revealed patchy enhancement of the right kidney concerning for pyelonephritis. Additionally there was urothelial thickening and hyperenhancement involving the right renal collecting system and ureter compatible with ascending urinary tract infection.  There was similar-appearing abdominal and retroperitoneal adenopathy.  She has a history of a skin rash s/p anterior thigh biopsy in 2012.  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   She was ttreated with a course of doxycycline, topical Clobetasol, and hydroxyzine for itching.  She states that she gets the rash on her legs in the summertime.  She was diagnosed with B12 deficiency in 2012.  She re-started oral B12 on 09/24/2018.  B12 was 237 on 03/11/202 and 570 on 10/21/2018.  Symptomatically, she denies any B symptoms.  Plan: 1.   Review interval labs. 2.   Chronic lymphocytic leukemia (CLL)  Clinically doing well.  Patient denies any B symptoms, increasing adenopathy, bruising or bleeding.  Platelet  count stable to slightly improved.  Review indications for treatment (B symptoms, progressive anemia or thrombocytopenia (platelets < 100,000), bulky disease, threatened organ  function).  Follow counts monthly.  If treatment needed, plan to initiate ibrutinib. 3.   B12 deficiency  B12 level was 570 on 10/21/2018.  Folate was 36 on 09/24/2018.  Continue oral B12.  Take folic acid 2-3 x/week. 4.   History of anemia  Hemoglobin is 12.8.  MCV is 97.6.  Patient has been on oral iron x 11 years  Ferritin 200 and iron saturation 49% (high).  Discontinue oral iron.  Follow-up iron saturation. 5.   RTC monthly x 2 for labs (CBC with diff).   6.   Check folate in 1 month. 7.   RTC in 3 months for MD assessment and labs (CBC with diff, CMP, iron studies, LDH, uric acid).   I discussed the assessment and treatment plan with the patient.  The patient was provided an opportunity to ask questions and all were answered.  The patient agreed with the plan and demonstrated an understanding of the instructions.  The patient was advised to call back or seek an in person evaluation if the symptoms worsen or if the condition fails to improve as anticipated.  I provided 12 minutes (2:17 PM - 2:29 PM) of non-face-to-face time during this encounter.  I provided these services from the [facility] office.   Lequita Asal, MD, PhD  10/22/2018, 2:17 PM

## 2018-10-22 NOTE — Progress Notes (Signed)
Confirmed name, DOB, and address. Denies any concerns at this time.  

## 2018-11-19 ENCOUNTER — Inpatient Hospital Stay: Payer: PPO | Attending: Hematology and Oncology

## 2018-11-19 DIAGNOSIS — R790 Abnormal level of blood mineral: Secondary | ICD-10-CM

## 2018-11-19 DIAGNOSIS — E538 Deficiency of other specified B group vitamins: Secondary | ICD-10-CM

## 2018-11-19 DIAGNOSIS — C9190 Lymphoid leukemia, unspecified not having achieved remission: Secondary | ICD-10-CM | POA: Diagnosis not present

## 2018-11-19 DIAGNOSIS — C9110 Chronic lymphocytic leukemia of B-cell type not having achieved remission: Secondary | ICD-10-CM

## 2018-11-19 LAB — CBC WITH DIFFERENTIAL/PLATELET
Abs Immature Granulocytes: 0.04 10*3/uL (ref 0.00–0.07)
Basophils Absolute: 0.1 10*3/uL (ref 0.0–0.1)
Basophils Relative: 0 %
Eosinophils Absolute: 0 10*3/uL (ref 0.0–0.5)
Eosinophils Relative: 0 %
HCT: 40.3 % (ref 36.0–46.0)
Hemoglobin: 12.4 g/dL (ref 12.0–15.0)
Immature Granulocytes: 0 %
Lymphocytes Relative: 88 %
Lymphs Abs: 30.8 10*3/uL — ABNORMAL HIGH (ref 0.7–4.0)
MCH: 30.5 pg (ref 26.0–34.0)
MCHC: 30.8 g/dL (ref 30.0–36.0)
MCV: 99 fL (ref 80.0–100.0)
Monocytes Absolute: 2 10*3/uL — ABNORMAL HIGH (ref 0.1–1.0)
Monocytes Relative: 6 %
Neutro Abs: 2.2 10*3/uL (ref 1.7–7.7)
Neutrophils Relative %: 6 %
Platelets: 107 10*3/uL — ABNORMAL LOW (ref 150–400)
RBC: 4.07 MIL/uL (ref 3.87–5.11)
RDW: 15.5 % (ref 11.5–15.5)
WBC: 35.1 10*3/uL — ABNORMAL HIGH (ref 4.0–10.5)
nRBC: 0 % (ref 0.0–0.2)

## 2018-11-19 LAB — FOLATE: Folate: 40 ng/mL (ref >5.9)

## 2018-12-17 ENCOUNTER — Inpatient Hospital Stay: Payer: PPO | Attending: Hematology and Oncology

## 2018-12-17 ENCOUNTER — Other Ambulatory Visit: Payer: Self-pay

## 2018-12-17 ENCOUNTER — Telehealth: Payer: Self-pay

## 2018-12-17 DIAGNOSIS — C9190 Lymphoid leukemia, unspecified not having achieved remission: Secondary | ICD-10-CM | POA: Insufficient documentation

## 2018-12-17 DIAGNOSIS — C9110 Chronic lymphocytic leukemia of B-cell type not having achieved remission: Secondary | ICD-10-CM

## 2018-12-17 DIAGNOSIS — E538 Deficiency of other specified B group vitamins: Secondary | ICD-10-CM

## 2018-12-17 DIAGNOSIS — R790 Abnormal level of blood mineral: Secondary | ICD-10-CM

## 2018-12-17 LAB — CBC WITH DIFFERENTIAL/PLATELET
Abs Immature Granulocytes: 0.06 10*3/uL (ref 0.00–0.07)
Basophils Absolute: 0 10*3/uL (ref 0.0–0.1)
Basophils Relative: 0 %
Eosinophils Absolute: 0.1 10*3/uL (ref 0.0–0.5)
Eosinophils Relative: 0 %
HCT: 38.6 % (ref 36.0–46.0)
Hemoglobin: 12.1 g/dL (ref 12.0–15.0)
Immature Granulocytes: 0 %
Lymphocytes Relative: 89 %
Lymphs Abs: 50.7 10*3/uL — ABNORMAL HIGH (ref 0.7–4.0)
MCH: 31.2 pg (ref 26.0–34.0)
MCHC: 31.3 g/dL (ref 30.0–36.0)
MCV: 99.5 fL (ref 80.0–100.0)
Monocytes Absolute: 3.5 10*3/uL — ABNORMAL HIGH (ref 0.1–1.0)
Monocytes Relative: 6 %
Neutro Abs: 2.9 10*3/uL (ref 1.7–7.7)
Neutrophils Relative %: 5 %
Platelets: 124 10*3/uL — ABNORMAL LOW (ref 150–400)
RBC: 3.88 MIL/uL (ref 3.87–5.11)
RDW: 15.6 % — ABNORMAL HIGH (ref 11.5–15.5)
WBC: 57.2 10*3/uL (ref 4.0–10.5)
nRBC: 0 % (ref 0.0–0.2)

## 2018-12-17 NOTE — Telephone Encounter (Signed)
Critical labs called into the office for the patient. WBC 57.1. Per Dr Mike Gip she wanted me to get in touch with the patient and see how is she feeling. The patient states she feel fine no nausea or vomiting/ no changes or any problems at all at this time, per Ms Fuster. The patient was understanding about the increase in her WBC but states she is normal.

## 2019-01-12 NOTE — Progress Notes (Signed)
[facility]  [address] [place], [place] [address] Phone: [phone]  Fax: [phone]   Clinic Day:  01/14/2019  Referring physician: Juline Patch, MD  Chief Complaint: Maureen [name] is a 78 y.o. female with recurrent stage IV CLL who is evaluated for 2 month assessment.  HPI: The patient was last seen in the medical oncology clinic on 10/22/2018. At that time, she denied any B symptoms.  Labs followed: 11/19/2018: WBC 35,100, hemoglobin 12.4, hematocrit 40.3, platelets 107,000. Folate 40.0. 12/17/2018: WBC 57,200, hemoglobin 12.1, hematocrit 48.6, platelets 124,000.   During the interim, she is doing "okay." She denies any fevers, sweats, or weight loss. She denies early satiety.  Her appetite is normal.   She notes an infection on her bilateral legs. She has scratches and abrasions. She reports this occurs each year in the summer when she cuts the grass and never occurs in the winter. She gets sudden blisters on her legs mostly, but has another on her shoulder. She reports they start out like the one on her shoulder and then fill with fluid. She popped the blisters with a sewing needle which she had sterilized. She notes her hands have previously been infected. She used cold water and ice with a doctor in [place] and has been put on antibiotics by Dr. Ma Hillock and Dr. Burlene Arnt.    She has been seen by dermatology in the past for this condition.  Biopsy revealed vesicular spongiotic dermatitis.    Past Medical History:  Diagnosis Date  . Anemia   . CLL (chronic lymphocytic leukemia) ([place]) 2002  . CLL (chronic lymphocytic leukemia) ([place])   . Itching   . Vitamin B 12 deficiency     Past Surgical History:  Procedure Laterality Date  . breeast mass removed  1988   benign    Family History  Family history unknown: Yes    Social History:  reports that she has been smoking cigarettes. She has a 28.00 pack-year smoking history. She has  never used smokeless tobacco. She reports current alcohol use of about 1.0 standard drinks of alcohol per week. She reports that she does not use drugs. She drinks a glass of wine with dinner "once in awhile".  She stopped smoking 3 weeks ago.  She started smoking at age 64 and smoked 10-15 cigarettes/day.  She is from [place].  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  Her husband would often do tours of duty in [place]. She is alone today.   Allergies: No Known Allergies  Current Medications: Current Outpatient Medications  Medication Sig Dispense Refill  . ferrous sulfate 325 (65 FE) MG tablet Take 325 mg by mouth daily with breakfast.    . folic acid (FOLVITE) 846 MCG tablet Take 800 mcg by mouth daily.     . vitamin B-12 (CYANOCOBALAMIN) 1000 MCG tablet Take 1,000 mcg by mouth daily.    . Ibuprofen 200 MG CAPS Take 1 capsule by mouth as needed (PAIN).     No current facility-administered medications for this visit.     Review of Systems  Constitutional: Negative.  Negative for chills, diaphoresis, fever, malaise/fatigue and weight loss (up 1lb).       Feels good.  HENT: Negative.  Negative for congestion, ear discharge, ear pain, hearing loss, sinus pain and sore throat.   Eyes: Negative.  Negative for blurred vision, double vision, photophobia and pain.  Respiratory: Negative.  Negative for cough, shortness  of breath and wheezing.   Cardiovascular: Negative.  Negative for chest pain, palpitations, orthopnea, leg swelling and PND.  Gastrointestinal: Positive for constipation (oral iron). Negative for abdominal pain, blood in stool, diarrhea, melena, nausea and vomiting.  Genitourinary: Negative.  Negative for dysuria, frequency, hematuria and urgency.  Musculoskeletal: Negative.  Negative for back pain, joint pain and myalgias.  Skin: Positive for itching and rash.       Skin lesions- chronic recurrent.  Neurological: Negative.  Negative for dizziness, tingling, sensory  change, weakness and headaches.  Endo/Heme/Allergies: Negative.  Does not bruise/bleed easily.  Psychiatric/Behavioral: Negative.  Negative for depression, memory loss and substance abuse. The patient is not nervous/anxious and does not have insomnia.   All other systems reviewed and are negative.   Performance status (ECOG): 1  Blood pressure (!) 150/77, pulse 76, temperature 98.4 F (36.9 C), temperature source Tympanic, resp. rate 18, height 5' 7" (1.702 m), weight 125 lb 8.8 oz (56.9 kg), SpO2 98 %.   Physical Exam  Constitutional: She is oriented to person, place, and time. She appears well-developed and well-nourished. No distress.  HENT:  Head: Normocephalic and atraumatic.  Mouth/Throat: Oropharynx is clear and moist. No oropharyngeal exudate.  Mask. Wearing a blue scarf.   Eyes: Pupils are equal, round, and reactive to light. Conjunctivae and EOM are normal. No scleral icterus.  Green eyes.   Neck: Normal range of motion. Neck supple. No JVD present.  Cardiovascular: Normal rate, regular rhythm and normal heart sounds.  No murmur heard. Pulmonary/Chest: Effort normal and breath sounds normal. No respiratory distress. She has no wheezes.  Abdominal: Soft. Bowel sounds are normal. She exhibits no distension. There is no abdominal tenderness. There is no rebound and no guarding.  Musculoskeletal: Normal range of motion.        General: No edema.  Lymphadenopathy:    She has no cervical adenopathy.    She has no axillary adenopathy.       Right: No supraclavicular adenopathy present.       Left: No supraclavicular adenopathy present.  Neurological: She is alert and oriented to person, place, and time.  Skin: Skin is warm and dry. Abrasion, lesion (blisters on bilateral legs) and rash noted. She is not diaphoretic. No erythema.  Psychiatric: She has a normal mood and affect. Her behavior is normal. Judgment and thought content normal.  Nursing note and vitals reviewed.  LEFT  LEG:   RIGHT LEG:   RIGHT SHOULDER:    Appointment on 01/14/2019  Component Date Value Ref Range Status  . Sed Rate 01/14/2019 4  0 - 30 mm/hr Final   Performed at [facility], [address] [place], [place] [address]  . Uric Acid, Serum 01/14/2019 4.2  2.5 - 7.1 mg/dL Final   Performed at [facility], [address], [place], [place] [address]  . LDH 01/14/2019 163  98 - 192 U/L Final   Performed at [facility], [address], [place], [place] [address]  . Sodium 01/14/2019 140  135 - 145 mmol/L Final  . Potassium 01/14/2019 4.6  3.5 - 5.1 mmol/L Final  . Chloride 01/14/2019 107  98 - 111 mmol/L Final  . CO2 01/14/2019 25  22 - 32 mmol/L Final  . Glucose, Bld 01/14/2019 108* 70 - 99 mg/dL Final  . BUN 01/14/2019 17  8 - 23 mg/dL Final  . Creatinine, Ser 01/14/2019 0.70  0.44 - 1.00 mg/dL Final  . Calcium  01/14/2019 8.9  8.9 - 10.3 mg/dL Final  . Total Protein 01/14/2019 6.3* 6.5 - 8.1 g/dL Final  . Albumin 01/14/2019 4.5  3.5 - 5.0 g/dL Final  . AST 01/14/2019 22  15 - 41 U/L Final  . ALT 01/14/2019 17  0 - 44 U/L Final  . Alkaline Phosphatase 01/14/2019 60  38 - 126 U/L Final  . Total Bilirubin 01/14/2019 0.5  0.3 - 1.2 mg/dL Final  . GFR calc non Af Amer 01/14/2019 >60  >60 mL/min Final  . GFR calc Af Amer 01/14/2019 >60  >60 mL/min Final  . Anion gap 01/14/2019 8  5 - 15 Final   Performed at [facility], [address], [place], [place] [address]  . WBC 01/14/2019 59.7* 4.0 - 10.5 K/uL Final   Comment: This critical result has verified and been called to [facility] by Memory Argue on 07 01 2020 at 1157, and has been read back.  This critical result has verified and been called to [facility] by Memory Argue on 07 01 2020 at 1202, and has been read back.    Marland Kitchen RBC 01/14/2019 3.80* 3.87 - 5.11 MIL/uL Final  . Hemoglobin 01/14/2019 12.1  12.0 - 15.0 g/dL Final  . HCT 01/14/2019 38.0  36.0 - 46.0 % Final   . MCV 01/14/2019 100.0  80.0 - 100.0 fL Final  . MCH 01/14/2019 31.8  26.0 - 34.0 pg Final  . MCHC 01/14/2019 31.8  30.0 - 36.0 g/dL Final  . RDW 01/14/2019 15.7* 11.5 - 15.5 % Final  . Platelets 01/14/2019 111* 150 - 400 K/uL Final   Comment: Immature Platelet Fraction may be clinically indicated, consider ordering this additional test [id number]   . nRBC 01/14/2019 0.0  0.0 - 0.2 % Final  . Neutrophils Relative % 01/14/2019 5  % Final  . Neutro Abs 01/14/2019 2.7  1.7 - 7.7 K/uL Final  . Lymphocytes Relative 01/14/2019 89  % Final  . Lymphs Abs 01/14/2019 53.3* 0.7 - 4.0 K/uL Final  . Monocytes Relative 01/14/2019 6  % Final  . Monocytes Absolute 01/14/2019 3.6* 0.1 - 1.0 K/uL Final  . Eosinophils Relative 01/14/2019 0  % Final  . Eosinophils Absolute 01/14/2019 0.1  0.0 - 0.5 K/uL Final  . Basophils Relative 01/14/2019 0  % Final  . Basophils Absolute 01/14/2019 0.0  0.0 - 0.1 K/uL Final  . WBC Morphology 01/14/2019 SMUDGE CELLS   Final  . Immature Granulocytes 01/14/2019 0  % Final  . Abs Immature Granulocytes 01/14/2019 0.08* 0.00 - 0.07 K/uL Final   Performed at [facility], [address] [place], [place] [address]    Assessment:  Maureen [name] is a 78 y.o. female with stage IV CLL.  She was diagnosed in [place] in 2002.  Bone marrow on 03/2008 revealed 90% cellularity with diffuse involvement by CLL.  Karyotype was normal.  FISH studies on 08/19/2018 revealed loss of one ATM, homologous deletion of 13q, and normal CCDN1/IGH, chromosome 12, and TP53.  She received FCR x 4 cycles (last 08/06/2008). She had a partial remission.  She received single agent Rituxan x 7.  Treatment was held secondary to neutropenia.  She received 6 cycles of Gazyva (07/26/2016 - 01/23/2017).  Chest, abdomen, and pelvis CT on 10/17/2017 revealed significant partial treatment response. There was mild axillary, mediastinal, hilar, retroperitoneal, mesenteric and bilateral pelvic  adenopathy, all significantly decreased in the interval.  There was mild splenomegaly (13 cm), significantly decreased.  There was a new 3.4 cm masslike focus of hypoenhancement in the posterior upper right kidney, indeterminate. There was a stable 3.1 cm infrarenal abdominal aortic aneurysm  Abdomen and pelvis CT on 02/13/2018 revealed patchy enhancement of the right kidney concerning for pyelonephritis. Additionally there was urothelial thickening and hyperenhancement involving the right renal collecting system and ureter compatible with ascending urinary tract infection.  There was similar-appearing abdominal and retroperitoneal adenopathy.  She has a history of a skin rash s/p anterior thigh biopsy in 2012.  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   She was ttreated with a course of doxycycline, topical Clobetasol, and hydroxyzine for itching.  She states that she gets the rash on her legs in the summertime.  She was diagnosed with B12 deficiency in 2012.  She re-started oral B12 on 09/24/2018.  B12 was 237 on 03/11/202 and 570 on 10/21/2018.  Symptomatically, she denies any fevers, sweats or weight loss.  She has multiple skin lesions similar to prior episodes.  Plan: 1.   Labs today:  CBC with diff, CMP, LDH uric acid, ferritin, iron studies, sed rate. 2.   Chronic lymphocytic leukemia (CLL)             Clinically, she continues to do well.    She denies any B symptoms.    Exam reveals no adenopathy or hepatosplenomegaly.    Hematocrit 38.0.  Hemoglobin 12.1.  Platelets 111,000.  WBC 59,700 (ANC 2700; ALC 53,300)             No indication for treatment: B symptoms, progressive anemia or thrombocytopenia (platelets < 100,000), bulky disease, threatened organ function.             Continue close surveillance.             If treatment needed, plan to initiate ibrutinib. 3.   B12 deficiency             B12 level was 570 on  10/21/2018.             Folate was 36 on 09/24/2018.             Continue oral Q94 and folic acid 5-0T/UUEK. 4.   History of elevated iron saturation             Iron saturation was 49% on 09/24/2018.    Iron saturation was 35% today.    Ferritin is 143 (normal) today.             Recheck labs in 6 months. 5.   Skin lesions   Patient notes recurrence of skin lesions which have been biopsied in the past.  Unclear if she has received antibiotics for these lesions.  Contact Dr. Otilio Miu regarding follow-up. 6.   RTC in 2 months for MD assessment and labs (CBC with diff, CMP, LDH, uric acid).  I discussed the assessment and treatment plan with the patient.  The patient was provided an opportunity to ask questions and all were answered.  The patient agreed with the plan and demonstrated an understanding of the instructions.  The patient was advised to call back if the symptoms worsen or if the condition fails to improve as anticipated.  I provided 20 minutes of face-to-face time during this this encounter and > 50% was spent counseling as documented under my assessment and plan.    Lequita Asal, MD, PhD    01/14/2019, 12:26 PM  I, Molly Dorshimer, am acting  as scribe for [place]. Mike Gip, MD, PhD.  I, Melissa C. Mike Gip, MD, have reviewed the above documentation for accuracy and completeness, and I agree with the above.

## 2019-01-14 ENCOUNTER — Inpatient Hospital Stay: Payer: PPO

## 2019-01-14 ENCOUNTER — Inpatient Hospital Stay: Payer: PPO | Attending: Hematology and Oncology | Admitting: Hematology and Oncology

## 2019-01-14 ENCOUNTER — Encounter: Payer: Self-pay | Admitting: Hematology and Oncology

## 2019-01-14 ENCOUNTER — Other Ambulatory Visit: Payer: Self-pay

## 2019-01-14 ENCOUNTER — Other Ambulatory Visit: Payer: Self-pay | Admitting: Hematology and Oncology

## 2019-01-14 ENCOUNTER — Telehealth: Payer: Self-pay

## 2019-01-14 VITALS — BP 150/77 | HR 76 | Temp 98.4°F | Resp 18 | Ht 67.0 in | Wt 125.6 lb

## 2019-01-14 DIAGNOSIS — L308 Other specified dermatitis: Secondary | ICD-10-CM

## 2019-01-14 DIAGNOSIS — C9110 Chronic lymphocytic leukemia of B-cell type not having achieved remission: Secondary | ICD-10-CM | POA: Insufficient documentation

## 2019-01-14 DIAGNOSIS — Z79899 Other long term (current) drug therapy: Secondary | ICD-10-CM | POA: Insufficient documentation

## 2019-01-14 DIAGNOSIS — E538 Deficiency of other specified B group vitamins: Secondary | ICD-10-CM

## 2019-01-14 DIAGNOSIS — D649 Anemia, unspecified: Secondary | ICD-10-CM | POA: Insufficient documentation

## 2019-01-14 DIAGNOSIS — R790 Abnormal level of blood mineral: Secondary | ICD-10-CM

## 2019-01-14 DIAGNOSIS — L989 Disorder of the skin and subcutaneous tissue, unspecified: Secondary | ICD-10-CM

## 2019-01-14 DIAGNOSIS — F17210 Nicotine dependence, cigarettes, uncomplicated: Secondary | ICD-10-CM | POA: Insufficient documentation

## 2019-01-14 LAB — CBC WITH DIFFERENTIAL/PLATELET
Abs Immature Granulocytes: 0.08 10*3/uL — ABNORMAL HIGH (ref 0.00–0.07)
Basophils Absolute: 0 10*3/uL (ref 0.0–0.1)
Basophils Relative: 0 %
Eosinophils Absolute: 0.1 10*3/uL (ref 0.0–0.5)
Eosinophils Relative: 0 %
HCT: 38 % (ref 36.0–46.0)
Hemoglobin: 12.1 g/dL (ref 12.0–15.0)
Immature Granulocytes: 0 %
Lymphocytes Relative: 89 %
Lymphs Abs: 53.3 10*3/uL — ABNORMAL HIGH (ref 0.7–4.0)
MCH: 31.8 pg (ref 26.0–34.0)
MCHC: 31.8 g/dL (ref 30.0–36.0)
MCV: 100 fL (ref 80.0–100.0)
Monocytes Absolute: 3.6 10*3/uL — ABNORMAL HIGH (ref 0.1–1.0)
Monocytes Relative: 6 %
Neutro Abs: 2.7 10*3/uL (ref 1.7–7.7)
Neutrophils Relative %: 5 %
Platelets: 111 10*3/uL — ABNORMAL LOW (ref 150–400)
RBC: 3.8 MIL/uL — ABNORMAL LOW (ref 3.87–5.11)
RDW: 15.7 % — ABNORMAL HIGH (ref 11.5–15.5)
WBC: 59.7 10*3/uL (ref 4.0–10.5)
nRBC: 0 % (ref 0.0–0.2)

## 2019-01-14 LAB — IRON AND TIBC
Iron: 84 ug/dL (ref 28–170)
Saturation Ratios: 35 % — ABNORMAL HIGH (ref 10.4–31.8)
TIBC: 244 ug/dL — ABNORMAL LOW (ref 250–450)
UIBC: 160 ug/dL

## 2019-01-14 LAB — COMPREHENSIVE METABOLIC PANEL
ALT: 17 U/L (ref 0–44)
AST: 22 U/L (ref 15–41)
Albumin: 4.5 g/dL (ref 3.5–5.0)
Alkaline Phosphatase: 60 U/L (ref 38–126)
Anion gap: 8 (ref 5–15)
BUN: 17 mg/dL (ref 8–23)
CO2: 25 mmol/L (ref 22–32)
Calcium: 8.9 mg/dL (ref 8.9–10.3)
Chloride: 107 mmol/L (ref 98–111)
Creatinine, Ser: 0.7 mg/dL (ref 0.44–1.00)
GFR calc Af Amer: >60 mL/min (ref >60)
GFR calc non Af Amer: >60 mL/min (ref >60)
Glucose, Bld: 108 mg/dL — ABNORMAL HIGH (ref 70–99)
Potassium: 4.6 mmol/L (ref 3.5–5.1)
Sodium: 140 mmol/L (ref 135–145)
Total Bilirubin: 0.5 mg/dL (ref 0.3–1.2)
Total Protein: 6.3 g/dL — ABNORMAL LOW (ref 6.5–8.1)

## 2019-01-14 LAB — SEDIMENTATION RATE: Sed Rate: 4 mm/hr (ref 0–30)

## 2019-01-14 LAB — URIC ACID: Uric Acid, Serum: 4.2 mg/dL (ref 2.5–7.1)

## 2019-01-14 LAB — LACTATE DEHYDROGENASE: LDH: 163 U/L (ref 98–192)

## 2019-01-14 LAB — FERRITIN: Ferritin: 143 ng/mL (ref 11–307)

## 2019-01-14 NOTE — Progress Notes (Signed)
Bilateral lower legs with open areas noted today /  weeping

## 2019-01-14 NOTE — Telephone Encounter (Signed)
Received call from [facility] in Lab regarding critical WBC of 59.9. Dr. Mike Gip made aware.

## 2019-01-14 NOTE — Telephone Encounter (Signed)
Correction: WBC 59.7

## 2019-01-15 ENCOUNTER — Telehealth: Payer: Self-pay

## 2019-01-15 NOTE — Telephone Encounter (Signed)
Called pt after receiving a call from Dr Janece Canterbury this am concerning some open places on pt's skin that "she has unroofed by herself." The pictures appear to be areas that are aggravated. Pt has been asked to call the front to schedule an appointment to start an antibiotic and then we may need to send to Dr Phillip Heal for further eval. Had to leave a message

## 2019-01-20 ENCOUNTER — Ambulatory Visit (INDEPENDENT_AMBULATORY_CARE_PROVIDER_SITE_OTHER): Payer: PPO | Admitting: Family Medicine

## 2019-01-20 ENCOUNTER — Other Ambulatory Visit: Payer: Self-pay

## 2019-01-20 ENCOUNTER — Encounter: Payer: Self-pay | Admitting: Family Medicine

## 2019-01-20 VITALS — BP 120/80 | HR 68 | Ht 67.0 in | Wt 125.0 lb

## 2019-01-20 DIAGNOSIS — L139 Bullous disorder, unspecified: Secondary | ICD-10-CM | POA: Diagnosis not present

## 2019-01-20 NOTE — Progress Notes (Signed)
Date:  01/20/2019   Name:  Maureen [name]   DOB:  March 19, 1941   MRN:  [id number]   Chief Complaint: Follow-up (Dr Janece Canterbury spoke to [place] concerning some places on legs- they start out looking like blisters)  Rash This is a chronic problem. The current episode started more than 1 year ago (several years). The problem has been waxing and waning since onset. The affected locations include the right lower leg and left lower leg. The rash is characterized by itchiness. Associated with: "been working in the grass" Pertinent negatives include no anorexia, congestion, cough, diarrhea, eye pain, facial edema, fatigue, fever, joint pain, nail changes, rhinorrhea, shortness of breath or sore throat. Past treatments include antihistamine.    Review of Systems  Constitutional: Negative for chills, fatigue and fever.  HENT: Negative for congestion, drooling, ear discharge, ear pain, rhinorrhea and sore throat.   Eyes: Negative for pain.  Respiratory: Negative for cough, shortness of breath and wheezing.   Cardiovascular: Negative for chest pain, palpitations and leg swelling.  Gastrointestinal: Negative for abdominal pain, anorexia, blood in stool, constipation, diarrhea and nausea.  Endocrine: Negative for polydipsia.  Genitourinary: Negative for dysuria, frequency, hematuria and urgency.  Musculoskeletal: Negative for back pain, joint pain, myalgias and neck pain.  Skin: Positive for rash. Negative for nail changes.  Allergic/Immunologic: Negative for environmental allergies.  Neurological: Negative for dizziness and headaches.  Hematological: Does not bruise/bleed easily.  Psychiatric/Behavioral: Negative for suicidal ideas. The patient is not nervous/anxious.     Patient Active Problem List   Diagnosis Date Noted  . Abnormal iron saturation 10/22/2018  . B12 deficiency 09/24/2018  . Renal mass, right 10/22/2017  . Chemotherapy-induced neutropenia ([place]) 10/14/2017  . CLL (chronic lymphocytic  leukemia) ([place]) 02/28/2016    No Known Allergies  Past Surgical History:  Procedure Laterality Date  . breeast mass removed  1988   benign    Social History   Tobacco Use  . Smoking status: Current Every Day Smoker    Packs/day: 0.50    Years: 56.00    Pack years: 28.00    Types: Cigarettes  . Smokeless tobacco: Never Used  Substance Use Topics  . Alcohol use: Yes    Alcohol/week: 1.0 standard drinks    Types: 1 Glasses of wine per week    Comment: occassional  . Drug use: No     Medication list has been reviewed and updated.  Current Meds  Medication Sig  . ferrous sulfate 325 (65 FE) MG tablet Take 325 mg by mouth daily with breakfast.  . folic acid (FOLVITE) 102 MCG tablet Take 800 mcg by mouth daily.   . Ibuprofen 200 MG CAPS Take 1 capsule by mouth as needed (PAIN).  Marland Kitchen vitamin B-12 (CYANOCOBALAMIN) 1000 MCG tablet Take 1,000 mcg by mouth daily.    PHQ 2/9 Scores 12/18/2017 12/17/2016 03/20/2016  PHQ - 2 Score 0 0 1  PHQ- 9 Score 0 - -    BP Readings from Last 3 Encounters:  01/20/19 120/80  01/14/19 (!) 150/77  09/24/18 129/72    Physical Exam Vitals signs and nursing note reviewed.  Constitutional:      General: She is not in acute distress.    Appearance: She is not diaphoretic.  HENT:     Head: Normocephalic and atraumatic.     Right Ear: Tympanic membrane, ear canal and external ear normal.     Left Ear: Tympanic membrane, ear canal and external ear normal.  Nose: Nose normal. No congestion or rhinorrhea.     Mouth/Throat:     Mouth: Mucous membranes are moist.     Comments: No ulcerations Eyes:     General:        Right eye: No discharge.        Left eye: No discharge.     Conjunctiva/sclera: Conjunctivae normal.     Pupils: Pupils are equal, round, and reactive to light.  Neck:     Musculoskeletal: Normal range of motion and neck supple.     Thyroid: No thyromegaly.     Vascular: No JVD.  Cardiovascular:     Rate and Rhythm: Normal  rate and regular rhythm.     Heart sounds: Normal heart sounds. No murmur. No friction rub. No gallop.   Pulmonary:     Effort: Pulmonary effort is normal.     Breath sounds: Normal breath sounds.  Abdominal:     General: Bowel sounds are normal.     Palpations: Abdomen is soft. There is no mass.     Tenderness: There is no abdominal tenderness. There is no guarding.  Musculoskeletal: Normal range of motion.  Lymphadenopathy:     Cervical: No cervical adenopathy.  Skin:    General: Skin is warm and dry.     Findings: Erythema present.     Comments: Bullous lesion lower leg with multiple shallow ulcerative lesions primarily lower legs  Neurological:     Mental Status: She is alert.     Deep Tendon Reflexes: Reflexes are normal and symmetric.     Wt Readings from Last 3 Encounters:  01/20/19 125 lb (56.7 kg)  01/14/19 125 lb 8.8 oz (56.9 kg)  09/24/18 124 lb 3.7 oz (56.4 kg)    BP 120/80   Pulse 68   Ht 5\' 7"  (1.702 m)   Wt 125 lb (56.7 kg)   BMI 19.58 kg/m   Assessment and Plan: 1. Bullous disorder Patient continues to have a breakout primarily of the lower legs of a bullous nature.  She has been seen by dermatology in the past and I am unsure of the diagnosis there.  Patient planes primarily of intense pruritus and oftentimes decompresses the bullae removal of fluid.  This results in a shallow ulcer which eventually heals with scarring.  Patient will be referred to dermatology for reevaluation.  My differential includes hypersensitivity to insect bites with bullous formation/ vesicular formation, within normal deformity with bullous formation, with unlikely pemphigoid or other bullous issues such as linear IgA. - Ambulatory referral to Dermatology

## 2019-01-21 DIAGNOSIS — L983 Eosinophilic cellulitis [Wells]: Secondary | ICD-10-CM | POA: Diagnosis not present

## 2019-02-01 DIAGNOSIS — L989 Disorder of the skin and subcutaneous tissue, unspecified: Secondary | ICD-10-CM | POA: Insufficient documentation

## 2019-03-17 ENCOUNTER — Other Ambulatory Visit: Payer: Self-pay

## 2019-03-17 NOTE — Progress Notes (Signed)
[facility]  [address] [place], [place] [address] Phone: [phone]  Fax: [phone]   Clinic Day:  03/18/2019  Referring physician: Juline Patch, MD  Chief Complaint: Maureen [name] is a 78 y.o. female with recurrent stage IV CLL who isevaluated for 2 month assessment.  HPI: The patient was last seen in the medical oncology clinic on 01/14/2019. At that time, she denied any fevers, sweats or weight loss.  She had multiple skin lesions (prior skin biopsy noted spongiotic dermatitis). Platelets were 111,000. WBC was 59,700 (ANC 2,700, ALC 53,300). LDH  Was 163.   She was seen by Dr Otilio Miu on 01/20/2019.  She continued to have a breakout primarily of the lower legs of a bullous nature.  She described intense pruritus and oftentimes decompressed the bullae with removal of fluid resulting in a shallow ulcer which eventually healed with scarring.  She was referred to dermatology for reevaluation.  During the interim, she has not been eating much.  She states that her appetite is poor as food has no taste.  Weight is down 6 pounds.  She denies any fevers or sweats.  She notes some bruising on her arms.  She has not been on oral iron.    She states that her rash is better.  She saw a dermatologist and was started on hydroxyzine 25-50 mg a day and doxycycline 100 mg BID.  She completed a month of treatment 2 days ago.  She states that neurology follow-up was postponed secondary to COVID-19.   Past Medical History:  Diagnosis Date  . Anemia   . CLL (chronic lymphocytic leukemia) ([place]) 2002  . CLL (chronic lymphocytic leukemia) ([place])   . Itching   . Vitamin B 12 deficiency     Past Surgical History:  Procedure Laterality Date  . breeast mass removed  1988   benign    Family History  Family history unknown: Yes    Social History:  reports that she has been smoking cigarettes. She has a 28.00 pack-year smoking history. She has never  used smokeless tobacco. She reports current alcohol use of about 1.0 standard drinks of alcohol per week. She reports that she does not use drugs. She drinks a glass of wine with dinner "once in awhile". She stopped smoking 3 weeks ago. She started smoking at age 75 and smoked 10-15 cigarettes/day. She is from [place]. 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 Her husband would often do tours of duty in [place]. She is alone today.   Allergies: No Known Allergies  Current Medications: Current Outpatient Medications  Medication Sig Dispense Refill  . clindamycin (CLEOCIN T) 1 % external solution APPLY SOLUTION TOPICALLY TWICE DAILY TO SPOT TREAT    . folic acid (FOLVITE) 476 MCG tablet Take 800 mcg by mouth 3 (three) times a week.     . hydrOXYzine (ATARAX/VISTARIL) 25 MG tablet TAKE 1 TO 2 TABLETS BY MOUTH ONCE DAILY AT NIGHT AT BEDTIME    . Ibuprofen 200 MG CAPS Take 1 capsule by mouth as needed (PAIN).    Marland Kitchen vitamin B-12 (CYANOCOBALAMIN) 1000 MCG tablet Take 1,000 mcg by mouth daily.    . Ibrutinib 420 MG TABS Take 420 mg by mouth daily. 30 tablet 5   No current facility-administered medications for this visit.     Review of Systems  Constitutional: Positive for weight loss (6 pounds). Negative for chills, diaphoresis, fever and malaise/fatigue.  Doesn't feel sick.  HENT: Negative.  Negative for congestion, ear discharge, ear pain, hearing loss, sinus pain and sore throat.   Eyes: Negative.  Negative for blurred vision, double vision, photophobia and pain.  Respiratory: Negative.  Negative for cough, sputum production, shortness of breath and wheezing.   Cardiovascular: Negative.  Negative for chest pain, palpitations, orthopnea, leg swelling and PND.  Gastrointestinal: Negative for abdominal pain, blood in stool, constipation, diarrhea, melena, nausea and vomiting.       Poor appetite- no taste.  Genitourinary: Negative.  Negative for dysuria, frequency, hematuria and  urgency.  Musculoskeletal: Negative.  Negative for back pain, falls, joint pain and myalgias.  Skin: Positive for rash (improved). Negative for itching.       Skin lesions- chronic recurrent.  Neurological: Negative.  Negative for dizziness, tingling, sensory change, speech change, focal weakness, weakness and headaches.  Endo/Heme/Allergies: Negative.   Psychiatric/Behavioral: Negative.  Negative for depression and memory loss. The patient is not nervous/anxious and does not have insomnia.   All other systems reviewed and are negative.  Performance status (ECOG): 1  Vitals Blood pressure 135/72, pulse 76, temperature 97.6 F (36.4 C), temperature source Tympanic, resp. rate 16, weight 119 lb 13.1 oz (54.3 kg), SpO2 100 %.   Physical Exam  Constitutional: She is oriented to person, place, and time.  Thin woman sitting comfortably in the exam room in no acute distress.  HENT:  Head: Normocephalic and atraumatic.  Right Ear: Tympanic membrane, external ear and ear canal normal.  Left Ear: Tympanic membrane, external ear and ear canal normal.  Nose: Nose normal. No rhinorrhea.  Mouth/Throat: Oropharynx is clear and moist. No oropharyngeal exudate.  Wearing a scarf and mask.   Eyes: Pupils are equal, round, and reactive to light. Conjunctivae and EOM are normal. Right eye exhibits no discharge. Left eye exhibits no discharge. No scleral icterus.  Green eyes.   Neck: Normal range of motion. Neck supple. No JVD present.  Cardiovascular: Normal rate, regular rhythm and normal heart sounds. Exam reveals no gallop and no friction rub.  No murmur heard. Pulmonary/Chest: Effort normal and breath sounds normal. No respiratory distress. She has no wheezes. She has no rales.  Abdominal: Soft. Bowel sounds are normal. She exhibits no distension and no mass. There is no abdominal tenderness. There is no rebound and no guarding.  Musculoskeletal: Normal range of motion.        General: No edema.   Lymphadenopathy:    She has no cervical adenopathy.    She has no axillary adenopathy.       Right: No supraclavicular adenopathy present.       Left: No supraclavicular adenopathy present.  Neurological: She is alert and oriented to person, place, and time. She has normal reflexes.  Skin: Skin is warm and dry. Rash (lower extremity rash, improved) noted. She is not diaphoretic. No pallor.  No blisters or ulcerations.  Psychiatric: She has a normal mood and affect. Her behavior is normal. Judgment and thought content normal.  Nursing note and vitals reviewed.   Appointment on 03/18/2019  Component Date Value Ref Range Status  . Iron 03/18/2019 181* 28 - 170 ug/dL Final  . TIBC 03/18/2019 212* 250 - 450 ug/dL Final  . Saturation Ratios 03/18/2019 86* 10.4 - 31.8 % Final  . UIBC 03/18/2019 31  ug/dL Final   Performed at [facility], [address], [place], [place] [address]  . Uric Acid, Serum 03/18/2019 4.3  2.5 -  7.1 mg/dL Final   Performed at [facility], [address] [place], [place] [address]  . LDH 03/18/2019 179  98 - 192 U/L Final   Performed at [facility], [address], [place], [place] [address]  . Sodium 03/18/2019 140  135 - 145 mmol/L Final  . Potassium 03/18/2019 5.4* 3.5 - 5.1 mmol/L Final  . Chloride 03/18/2019 105  98 - 111 mmol/L Final  . CO2 03/18/2019 26  22 - 32 mmol/L Final  . Glucose, Bld 03/18/2019 114* 70 - 99 mg/dL Final  . BUN 03/18/2019 18  8 - 23 mg/dL Final  . Creatinine, Ser 03/18/2019 0.69  0.44 - 1.00 mg/dL Final  . Calcium 03/18/2019 9.0  8.9 - 10.3 mg/dL Final  . Total Protein 03/18/2019 6.4* 6.5 - 8.1 g/dL Final  . Albumin 03/18/2019 4.4  3.5 - 5.0 g/dL Final  . AST 03/18/2019 22  15 - 41 U/L Final  . ALT 03/18/2019 16  0 - 44 U/L Final  . Alkaline Phosphatase 03/18/2019 69  38 - 126 U/L Final  . Total Bilirubin 03/18/2019 0.5  0.3 - 1.2 mg/dL Final  . GFR calc non Af Amer 03/18/2019 >60  >60 mL/min  Final  . GFR calc Af Amer 03/18/2019 >60  >60 mL/min Final  . Anion gap 03/18/2019 9  5 - 15 Final   Performed at [facility], [address] [place], [place] [address]  . WBC 03/18/2019 121.8* 4.0 - 10.5 K/uL Final   Comment: This critical result has verified and been called to [facility] by Memory Argue on 09 02 2020 at 0903, and has been read back.  This critical result has verified and been called to [facility] by Memory Argue on 09 02 2020 at 0921, and has been read back.    Marland Kitchen RBC 03/18/2019 2.97* 3.87 - 5.11 MIL/uL Final  . Hemoglobin 03/18/2019 9.4* 12.0 - 15.0 g/dL Final  . HCT 03/18/2019 30.6* 36.0 - 46.0 % Final  . MCV 03/18/2019 103.0* 80.0 - 100.0 fL Final  . MCH 03/18/2019 31.6  26.0 - 34.0 pg Final  . MCHC 03/18/2019 30.7  30.0 - 36.0 g/dL Final  . RDW 03/18/2019 16.5* 11.5 - 15.5 % Final  . Platelets 03/18/2019 114* 150 - 400 K/uL Final   Comment: Immature Platelet Fraction may be clinically indicated, consider ordering this additional test [id number]   . nRBC 03/18/2019 0.0  0.0 - 0.2 % Final  . Neutrophils Relative % 03/18/2019 2  % Final  . Neutro Abs 03/18/2019 2.7  1.7 - 7.7 K/uL Final  . Lymphocytes Relative 03/18/2019 93  % Final  . Lymphs Abs 03/18/2019 113.5* 0.7 - 4.0 K/uL Final  . Monocytes Relative 03/18/2019 5  % Final  . Monocytes Absolute 03/18/2019 5.4* 0.1 - 1.0 K/uL Final  . Eosinophils Relative 03/18/2019 0  % Final  . Eosinophils Absolute 03/18/2019 0.0  0.0 - 0.5 K/uL Final  . Basophils Relative 03/18/2019 0  % Final  . Basophils Absolute 03/18/2019 0.0  0.0 - 0.1 K/uL Final  . Immature Granulocytes 03/18/2019 0  % Final  . Abs Immature Granulocytes 03/18/2019 0.13* 0.00 - 0.07 K/uL Final   Performed at [facility], [address], [place], [place] [address]  . Potassium 03/18/2019 5.0  3.5 - 5.1 mmol/L Final   Performed at [facility], [address], [place], [place] [address]  . Retic  Ct Pct  03/18/2019 0.9  0.4 - 3.1 % Final  . RBC. 03/18/2019 3.05* 3.87 - 5.11 MIL/uL Final  . Retic Count, Absolute 03/18/2019 27.1  19.0 - 186.0 K/uL Final  . Immature Retic Fract 03/18/2019 14.4  2.3 - 15.9 % Final   Performed at [facility], [address], [place], [place] [address]  . Ferritin 03/18/2019 244  11 - 307 ng/mL Final   Performed at [facility], [address] [place], [place] [address]  . Haptoglobin 03/18/2019 77  42 - 346 mg/dL Final   Comment: (NOTE) Performed At: [facility] [place], [place] [id number] Rush Farmer MD XN:[id number]     Assessment:  Maureen [name] is a 78 y.o. female with stage IV CLL. She was diagnosed in [place] in 2002. Bone marrowon 03/2008 revealed 90% cellularity with diffuse involvement by CLL. Karyotype was normal. FISHstudies on 08/19/2018 revealed loss of one ATM, homologous deletion of 13q, and normal CCDN1/IGH, chromosome 12, and TP53.  She received FCRx 4 cycles (last 08/06/2008). She had a partial remission. She received single agent Rituxanx 7. Treatment was held secondary to neutropenia.  She received 6 cycles of Gazyva(07/26/2016 - 01/23/2017).  Chest, abdomen, and pelvis CTon 10/17/2017 revealed significant partial treatment response. There was mild axillary, mediastinal, hilar, retroperitoneal, mesenteric and bilateral pelvic adenopathy, all significantly decreased in the interval. There was mild splenomegaly (13 cm), significantly decreased. There was a new 3.4 cm masslike focus of hypoenhancement in the posterior upper right kidney, indeterminate. There was a stable 3.1 cm infrarenal abdominal aortic aneurysm  Abdomen and pelvis CTon 02/13/2018 revealed patchy enhancement of the right kidney concerning for pyelonephritis. Additionally there was urothelial thickening and hyperenhancement involving the right renal collecting system and ureter compatible with ascending  urinary tract infection. There was similar-appearing abdominal and retroperitoneal adenopathy.  She has a history of a skin rashs/p anterior thigh biopsy in 2012. 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 She was ttreated with a course of doxycycline, topical Clobetasol, and hydroxyzine for itching. She states that she gets the rash on her legs in the summertime.  She was diagnosed with B12 deficiencyin 2012. Shere-started oral B12on 09/24/2018. B12 was 237 on 03/11/202 and570 on 10/21/2018.  Symptomatically, she has lost 6 pounds in the past 2 months.  She denies any fevers or sweats.  Exam reveals no adenopathy or hepatosplenomegaly.  WBC is 121,800.  Plan: 1.   Labs today:  CBC with diff, CMP, LDH, uric acid, iron studies. 2. Chronic lymphocytic leukemia (CLL)  Clinically, she is doing fair.  She has lost weight since last visit.  WBC has increased from 35,100 to 121,800 in the past 4 months.   WBC doubling time is 2 months.  Discuss plan to initiate therapy.  Discuss prior conversation regarding initiation of ibrutinib.     Side effects and information provided.   Discuss transient increase in WBC count before improvement.   Preauth ibutinib.  Contact Nuala Alpha, oral chemotherapy pharmacist, regarding medication assistance.  3. Anemia  Hemoglobin has decreased from 12.1 to 9.4 over past 2 months.  Patient denies any bleeding.  Etiology may be secondary to CLL.  RBCs are macrocytic.  No evidence of hemolysis.  Patient on B12 and folate (see below).  Ferritin was 143 on 01/14/2019.  Check ferritin, haptoglobin, retic. 4.   B12 deficiency B12 level was 570 on 10/21/2018. Folate was 36 on 09/24/2018. Patient continues oral R42 and folic acid. 5.  Elevated iron saturation Iron saturation was 49% on 09/24/2018.               Iron saturation was 35% on  01/14/2019.  Iron saturation is 86% today.             Consider hemochromatosis testing. 6.   Hyperkalemia  Potassium 5.4.  Potassium on recheck was 5.0.  Encourage fluids.  Creatinine stable.  No evidence of tumor lysis. 7.   Skin lesions             Patient has been seen by dermatology and treated.  Continue to monitor. 8.   RTC in 1 week for MD assessment and labs (CBC with diff, CMP, uric acid, TSH, DAT).  I discussed the assessment and treatment plan with the patient.  The patient was provided an opportunity to ask questions and all were answered.  The patient agreed with the plan and demonstrated an understanding of the instructions.  The patient was advised to call back if the symptoms worsen or if the condition fails to improve as anticipated.   Lequita Asal, MD, PhD    03/18/2019,  3:35 PM

## 2019-03-18 ENCOUNTER — Inpatient Hospital Stay: Payer: PPO

## 2019-03-18 ENCOUNTER — Inpatient Hospital Stay: Payer: PPO | Attending: Hematology and Oncology | Admitting: Hematology and Oncology

## 2019-03-18 ENCOUNTER — Telehealth: Payer: Self-pay | Admitting: Pharmacy Technician

## 2019-03-18 ENCOUNTER — Telehealth: Payer: Self-pay | Admitting: Pharmacist

## 2019-03-18 ENCOUNTER — Encounter: Payer: Self-pay | Admitting: Hematology and Oncology

## 2019-03-18 ENCOUNTER — Other Ambulatory Visit: Payer: Self-pay | Admitting: Hematology and Oncology

## 2019-03-18 ENCOUNTER — Telehealth: Payer: Self-pay

## 2019-03-18 VITALS — BP 135/72 | HR 76 | Temp 97.6°F | Resp 16 | Wt 119.8 lb

## 2019-03-18 DIAGNOSIS — R790 Abnormal level of blood mineral: Secondary | ICD-10-CM | POA: Diagnosis not present

## 2019-03-18 DIAGNOSIS — I714 Abdominal aortic aneurysm, without rupture: Secondary | ICD-10-CM | POA: Insufficient documentation

## 2019-03-18 DIAGNOSIS — R630 Anorexia: Secondary | ICD-10-CM | POA: Diagnosis not present

## 2019-03-18 DIAGNOSIS — E875 Hyperkalemia: Secondary | ICD-10-CM | POA: Diagnosis not present

## 2019-03-18 DIAGNOSIS — C9110 Chronic lymphocytic leukemia of B-cell type not having achieved remission: Secondary | ICD-10-CM

## 2019-03-18 DIAGNOSIS — E538 Deficiency of other specified B group vitamins: Secondary | ICD-10-CM | POA: Diagnosis not present

## 2019-03-18 DIAGNOSIS — Z7189 Other specified counseling: Secondary | ICD-10-CM

## 2019-03-18 DIAGNOSIS — R21 Rash and other nonspecific skin eruption: Secondary | ICD-10-CM | POA: Insufficient documentation

## 2019-03-18 DIAGNOSIS — D649 Anemia, unspecified: Secondary | ICD-10-CM | POA: Insufficient documentation

## 2019-03-18 DIAGNOSIS — R590 Localized enlarged lymph nodes: Secondary | ICD-10-CM | POA: Diagnosis not present

## 2019-03-18 DIAGNOSIS — Z87891 Personal history of nicotine dependence: Secondary | ICD-10-CM | POA: Insufficient documentation

## 2019-03-18 DIAGNOSIS — R634 Abnormal weight loss: Secondary | ICD-10-CM | POA: Insufficient documentation

## 2019-03-18 DIAGNOSIS — L309 Dermatitis, unspecified: Secondary | ICD-10-CM | POA: Diagnosis not present

## 2019-03-18 DIAGNOSIS — Z79899 Other long term (current) drug therapy: Secondary | ICD-10-CM | POA: Insufficient documentation

## 2019-03-18 LAB — COMPREHENSIVE METABOLIC PANEL
ALT: 16 U/L (ref 0–44)
AST: 22 U/L (ref 15–41)
Albumin: 4.4 g/dL (ref 3.5–5.0)
Alkaline Phosphatase: 69 U/L (ref 38–126)
Anion gap: 9 (ref 5–15)
BUN: 18 mg/dL (ref 8–23)
CO2: 26 mmol/L (ref 22–32)
Calcium: 9 mg/dL (ref 8.9–10.3)
Chloride: 105 mmol/L (ref 98–111)
Creatinine, Ser: 0.69 mg/dL (ref 0.44–1.00)
GFR calc Af Amer: >60 mL/min (ref >60)
GFR calc non Af Amer: >60 mL/min (ref >60)
Glucose, Bld: 114 mg/dL — ABNORMAL HIGH (ref 70–99)
Potassium: 5.4 mmol/L — ABNORMAL HIGH (ref 3.5–5.1)
Sodium: 140 mmol/L (ref 135–145)
Total Bilirubin: 0.5 mg/dL (ref 0.3–1.2)
Total Protein: 6.4 g/dL — ABNORMAL LOW (ref 6.5–8.1)

## 2019-03-18 LAB — CBC WITH DIFFERENTIAL/PLATELET
Abs Immature Granulocytes: 0.13 10*3/uL — ABNORMAL HIGH (ref 0.00–0.07)
Basophils Absolute: 0 10*3/uL (ref 0.0–0.1)
Basophils Relative: 0 %
Eosinophils Absolute: 0 10*3/uL (ref 0.0–0.5)
Eosinophils Relative: 0 %
HCT: 30.6 % — ABNORMAL LOW (ref 36.0–46.0)
Hemoglobin: 9.4 g/dL — ABNORMAL LOW (ref 12.0–15.0)
Immature Granulocytes: 0 %
Lymphocytes Relative: 93 %
Lymphs Abs: 113.5 10*3/uL — ABNORMAL HIGH (ref 0.7–4.0)
MCH: 31.6 pg (ref 26.0–34.0)
MCHC: 30.7 g/dL (ref 30.0–36.0)
MCV: 103 fL — ABNORMAL HIGH (ref 80.0–100.0)
Monocytes Absolute: 5.4 10*3/uL — ABNORMAL HIGH (ref 0.1–1.0)
Monocytes Relative: 5 %
Neutro Abs: 2.7 10*3/uL (ref 1.7–7.7)
Neutrophils Relative %: 2 %
Platelets: 114 10*3/uL — ABNORMAL LOW (ref 150–400)
RBC: 2.97 MIL/uL — ABNORMAL LOW (ref 3.87–5.11)
RDW: 16.5 % — ABNORMAL HIGH (ref 11.5–15.5)
WBC: 121.8 10*3/uL (ref 4.0–10.5)
nRBC: 0 % (ref 0.0–0.2)

## 2019-03-18 LAB — POTASSIUM: Potassium: 5 mmol/L (ref 3.5–5.1)

## 2019-03-18 LAB — IRON AND TIBC
Iron: 181 ug/dL — ABNORMAL HIGH (ref 28–170)
Saturation Ratios: 86 % — ABNORMAL HIGH (ref 10.4–31.8)
TIBC: 212 ug/dL — ABNORMAL LOW (ref 250–450)
UIBC: 31 ug/dL

## 2019-03-18 LAB — FERRITIN: Ferritin: 244 ng/mL (ref 11–307)

## 2019-03-18 LAB — RETICULOCYTES
Immature Retic Fract: 14.4 % (ref 2.3–15.9)
RBC.: 3.05 MIL/uL — ABNORMAL LOW (ref 3.87–5.11)
Retic Count, Absolute: 27.1 10*3/uL (ref 19.0–186.0)
Retic Ct Pct: 0.9 % (ref 0.4–3.1)

## 2019-03-18 LAB — LACTATE DEHYDROGENASE: LDH: 179 U/L (ref 98–192)

## 2019-03-18 LAB — URIC ACID: Uric Acid, Serum: 4.3 mg/dL (ref 2.5–7.1)

## 2019-03-18 MED ORDER — IBRUTINIB 420 MG PO TABS: 420.0000 mg | ORAL_TABLET | Freq: Every day | ORAL | 5 refills | Status: AC

## 2019-03-18 NOTE — Patient Instructions (Signed)
Ibrutinib capsules and tablets What is this medicine? IBRUTINIB (eye BROO ti nib) is a medicine that targets proteins in cancer cells and stops the cancer cells from growing. It is used to treat mantle cell lymphoma, chronic lymphocytic leukemia, small lymphocytic lymphoma, marginal zone lymphoma, Waldenstrom macroglobulinemia, and chronic graft-versus-host disease. This medicine may be used for other purposes; ask your health care provider or pharmacist if you have questions. COMMON BRAND NAME(S): Imbruvica What should I tell my health care provider before I take this medicine? They need to know if you have any of these conditions:  bleeding disorders  diabetes  heart disease  high blood pressure  high cholesterol  history of irregular heartbeat  infection  liver disease  recent surgery  smoke tobacco  take medicines that treat or prevent blood clots  an unusual or allergic reaction to ibrutinib, other medicines, foods, dyes, or preservatives  pregnant or trying to get pregnant  breast-feeding How should I use this medicine? Take this medicine by mouth with a glass of water. Follow the directions on the prescription label. Do not cut, crush or chew this medicine. Do not take with grapefruit juice or eat [place] oranges. Take your medicine at regular intervals. Do not take it more often than directed. Do not stop taking except on your doctor's advice. Talk to your pediatrician regarding the use of this medicine in children. Special care may be needed. Overdosage: If you think you have taken too much of this medicine contact a poison control center or emergency room at once. NOTE: This medicine is only for you. Do not share this medicine with others. What if I miss a dose? If you miss a dose, take it as soon as you can. If it is almost time for your next dose, take only that dose. Do not take double or extra doses. What may interact with this medicine? This medicine may  interact with the following medications:  antiviral medications for HIV or AIDS  aprepitant  boceprevir  calcium channel blockers like diltiazem and verapamil  certain antibiotics like clarithromycin, erythromycin, and troleandomycin  certain medicines for fungal infections like fluconazole, ketoconazole, itraconazole, posaconazole, and voriconazole  certain medicines for seizures like carbamazepine and phenytoin  cimetidine  ciprofloxacin  clotrimazole  conivaptan  crizotinib  cyclosporine  digoxin  dronedarone  enzalutamide  fluvoxamine  grapefruit juice or [place] oranges  idelalisib  imatinib  methotrexate  mitotane  nefazodone  rifampin  St. John's wort  tofisopam This list may not describe all possible interactions. Give your health care provider a list of all the medicines, herbs, non-prescription drugs, or dietary supplements you use. Also tell them if you smoke, drink alcohol, or use illegal drugs. Some items may interact with your medicine. What should I watch for while using this medicine? You may need blood work done while you are taking this medicine. This medicine may increase your risk to bruise or bleed. Call your doctor or health care professional if you notice any unusual bleeding. Call your doctor or health care professional for advice if you get a fever, chills or sore throat, or other symptoms of a cold or flu. Do not treat yourself. This drug decreases your body's ability to fight infections. Try to avoid being around people who are sick. If you are going to have surgery or any other procedures, tell your doctor you are taking this medicine. Tell your dentist and dental surgeon that you are taking this medicine. You should not have major dental  surgery while on this medicine. See your dentist to have a dental exam and fix any dental problems before starting this medicine. Talk to your doctor about your risk of cancer. You may be more at  risk for certain types of cancers if you take this medicine. Do not become pregnant while taking this medicine or for 1 month after stopping it. Women should inform their doctor if they wish to become pregnant or think they might be pregnant. Men should not father a child while taking this medicine and for 1 month after stopping it. There is a potential for serious side effects to an unborn child. Talk to your health care professional or pharmacist for more information. Do not breast-feed an infant while taking this medicine or for 1 week after stopping it. What side effects may I notice from receiving this medicine? Side effects that you should report to your doctor or health care professional as soon as possible:  allergic reactions like skin rash, itching or hives, swelling of the face, lips, or tongue  low blood counts - this medicine may decrease the number of white blood cells, red blood cells and platelets. You may be at increased risk for infections and bleeding  signs or symptoms of bleeding such as bloody or black, tarry stools; red or dark-brown urine; spitting up blood or brown material that looks like coffee grounds; red spots on the skin; unusual bruising or bleeding from the eye, gums, or nose; confusion; trouble speaking or understanding; severe headaches; weakness; or dizziness  signs and symptoms of a dangerous change in heartbeat or heart rhythm like chest pain; dizziness; fast or irregular heartbeat; palpitations; feeling faint or lightheaded, falls; breathing problems  signs and symptoms of infection like fever or chills; cough; sore throat; or pain when urinating  signs and symptoms of kidney injury like trouble passing urine or change in the amount of urine Side effects that usually do not require medical attention (report to your doctor or health care professional if they continue or are bothersome):  bone pain  diarrhea  mouth sores  muscle cramps  muscle  pain  nausea  tiredness This list may not describe all possible side effects. Call your doctor for medical advice about side effects. You may report side effects to FDA at 1-800-FDA-1088. Where should I keep my medicine? Keep out of the reach of children. Store between 20 and 25 degrees C (68 and 77 degrees F). Keep this medicine in the original container. Throw away any unused medicine after the expiration date. NOTE: This sheet is a summary. It may not cover all possible information. If you have questions about this medicine, talk to your doctor, pharmacist, or health care provider.  2020 Elsevier/Gold Standard (2018-11-06 10:56:02)

## 2019-03-18 NOTE — Progress Notes (Signed)
Patient here for follow up. Denies any concerns.  

## 2019-03-18 NOTE — Telephone Encounter (Signed)
Call received from [facility] in Lab stating patient has elevated WBC at 121.8. Lab repeated for clarification. Dr. Mike Gip made aware of critical lab.

## 2019-03-18 NOTE — Telephone Encounter (Signed)
Oral Oncology Pharmacist Encounter  Received new prescription for Imbruvica (ibrutinib) for the treatment of recurrent CLL, planned duration until disease progression or unacceptable drug toxicity.  CBC/CMP from 03/18/2019 assessed, no relevant lab abnormalities. Prescription dose and frequency assessed.   Current medication list in Epic reviewed, no relevant DDIs with ibrutinib identified.  Prescription has been e-scribed to the [facility] for benefits analysis and approval.  Oral Oncology Clinic will continue to follow for insurance authorization, copayment issues, initial counseling and start date.  Darl Pikes, PharmD, BCPS, [facility] Hematology/Oncology Clinical Pharmacist [facility] [phone]  03/18/2019 1:29 PM

## 2019-03-18 NOTE — Telephone Encounter (Signed)
Oral Oncology Patient Advocate Encounter  Received notification from [employer] that prior authorization for Maureen [name] is required.  PA submitted on CoverMyMeds Key [id number] Status is pending  Oral Oncology Clinic will continue to follow.  [place] [facility] Phone [phone] Fax [phone] 03/18/2019 2:24 PM

## 2019-03-19 LAB — HAPTOGLOBIN: Haptoglobin: 77 mg/dL (ref 42–346)

## 2019-03-19 NOTE — Telephone Encounter (Signed)
Oral Oncology Patient Advocate Encounter  Prior Authorization for Maureen [name] has been approved.    PA# [id number] Effective dates: 03/18/2019 through 03/17/2020  Patients co-pay is $2786.92.  We will apply for grant assistance to lower patients out of pocket expense.  Oral Oncology Clinic will continue to follow.   [place] Patient Maureen [name] Phone [phone] Fax [phone] 03/19/2019 8:20 AM

## 2019-03-20 ENCOUNTER — Telehealth: Payer: Self-pay | Admitting: Pharmacist

## 2019-03-20 NOTE — Telephone Encounter (Signed)
Oral Chemotherapy Pharmacist Encounter  Medication will be delivered on 03/25/2019. She also has an appt on that day, she knows not to start the Imbruvica until she is given the go ahead by Dr. Mike Gip.   Patient Education I spoke with patient for overview of new oral chemotherapy medication: Imbruvica (ibrutinib) for the treatment of recurrent CLL, planned duration until disease progression or unacceptable drug toxicity.   Counseled patient on administration, dosing, side effects, monitoring, drug-food interactions, safe handling, storage, and disposal. Patient will take 420 mg by mouth daily.  Side effects include but not limited to: decreased plt/wbc/hgb, diarrhea, fatigue, N/V, fluid retention.    Reviewed with patient importance of keeping a medication schedule and plan for any missed doses.  Ms. Donnell voiced understanding and appreciation. All questions answered. Medication handout placed in the mail.  Provided patient with [facility] phone number. Patient knows to call the office with questions or concerns. Oral [facility] will continue to follow.  Darl Pikes, PharmD, BCPS, [facility] Hematology/Oncology Clinical Pharmacist [facility] [phone]  03/20/2019 3:09 PM

## 2019-03-20 NOTE — Telephone Encounter (Signed)
Oral Chemotherapy Pharmacist Encounter  Successfully enrolled patient for copayment assistance funds from [facility] from the [place].  Award amount: $8,000 Effective dates: 02/18/2019 - 02/17/2020 ID: [id number] BIN: [id number] Group: [id number] PCN: [id number]  Billing information will be shared with [facility]. I will place a copy of the award letter to be scanned into patient's chart.  Darl Pikes, PharmD, BCPS Hematology/Oncology Clinical Pharmacist [facility] [phone]  03/20/2019 11:30 AM

## 2019-03-20 NOTE — Telephone Encounter (Signed)
Oral Chemotherapy Pharmacist Encounter  Successfully enrolled patient for copayment assistance funds from [facility] from the [place].  Award amount: $8,400 Effective dates: 12/20/2018 - 03/18/2020 ID: [id number] BIN: [id number] Group: [id number] PCN: [id number]  Billing information will be shared with [facility]. I will place a copy of the award letter to be scanned into patient's chart.  Darl Pikes, PharmD, BCPS Hematology/Oncology Clinical Pharmacist [facility] [phone]  03/20/2019 10:32 AM

## 2019-03-24 DIAGNOSIS — Z7189 Other specified counseling: Secondary | ICD-10-CM | POA: Insufficient documentation

## 2019-03-24 DIAGNOSIS — D649 Anemia, unspecified: Secondary | ICD-10-CM | POA: Insufficient documentation

## 2019-03-24 DIAGNOSIS — E875 Hyperkalemia: Secondary | ICD-10-CM | POA: Insufficient documentation

## 2019-03-24 MED FILL — IMBRUVICA 420 MG TAB: 420 | 28 days supply | Qty: 28 | Fill #0

## 2019-03-24 NOTE — Progress Notes (Signed)
[facility]  [address] [place], [place] [address] Phone: [phone]  Fax: [phone]   Clinic Day:  03/25/2019  Referring physician: Juline Patch, MD  Chief Complaint: Maureen [name] is a 78 y.o. female with recurrent stage IV CLL who isevaluated for 1 week assessment for initiation of ibrutinib.  HPI: The patient was last seen in the medical oncology clinic on 03/18/2019. At that time, she had lost 6 pounds in the past 2 months.  She denied any fevers or sweats.  Exam revealed no adenopathy or hepatosplenomegaly.  WBC was 121,800.  During the interim, she has done fairly well.  She notes feeling tired.  She denies any fevers sweats or weight loss.  Weight is up 2 pounds.  She is taking B12 daily.  She denies any bleeding.  She has increasing adenopathy.   Past Medical History:  Diagnosis Date  . Anemia   . CLL (chronic lymphocytic leukemia) ([place]) 2002  . CLL (chronic lymphocytic leukemia) ([place])   . Itching   . Vitamin B 12 deficiency     Past Surgical History:  Procedure Laterality Date  . breeast mass removed  1988   benign    Family History  Family history unknown: Yes    Social History:  reports that she has been smoking cigarettes. She has a 28.00 pack-year smoking history. She has never used smokeless tobacco. She reports current alcohol use of about 1.0 standard drinks of alcohol per week. She reports that she does not use drugs. She drinks a glass of wine with dinner "once in awhile". She stopped smoking 3 weeks ago. She started smoking at age 62 and smoked 10-15 cigarettes/day. She is from [place]. 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 Her husband would often do tours of duty in [place].She is alone today.  Allergies: No Known Allergies  Current Medications: Current Outpatient Medications  Medication Sig Dispense Refill  . clindamycin (CLEOCIN T) 1 % external solution APPLY SOLUTION TOPICALLY  TWICE DAILY TO SPOT TREAT    . folic acid (FOLVITE) 643 MCG tablet Take 800 mcg by mouth 3 (three) times a week.     . hydrOXYzine (ATARAX/VISTARIL) 25 MG tablet TAKE 1 TO 2 TABLETS BY MOUTH ONCE DAILY AT NIGHT AT BEDTIME    . Ibuprofen 200 MG CAPS Take 1 capsule by mouth as needed (PAIN).    Marland Kitchen vitamin B-12 (CYANOCOBALAMIN) 1000 MCG tablet Take 1,000 mcg by mouth daily.    . Ibrutinib 420 MG TABS Take 420 mg by mouth daily. (Patient not taking: Reported on 03/25/2019) 30 tablet 5   No current facility-administered medications for this visit.     Review of Systems  Constitutional: Negative for chills, diaphoresis, fever, malaise/fatigue and weight loss (up 2 pounds).       Feels "tired".  HENT: Negative.  Negative for congestion, ear discharge, ear pain, hearing loss, nosebleeds, sinus pain and sore throat.   Eyes: Negative.  Negative for blurred vision, double vision, photophobia and pain.  Respiratory: Negative.  Negative for cough, sputum production, shortness of breath and wheezing.   Cardiovascular: Negative.  Negative for chest pain, palpitations, orthopnea, leg swelling and PND.  Gastrointestinal: Negative for abdominal pain, blood in stool, constipation, diarrhea, melena, nausea and vomiting.       Poor appetite- no taste.  Genitourinary: Negative.  Negative for dysuria, frequency, hematuria and urgency.  Musculoskeletal: Negative.  Negative for back pain, falls, joint pain  and myalgias.  Skin: Positive for rash (improved). Negative for itching.       Skin lesions- chronic recurrent.  Neurological: Negative.  Negative for dizziness, tingling, sensory change, speech change, focal weakness, weakness and headaches.  Endo/Heme/Allergies: Negative.  Does not bruise/bleed easily.  Psychiatric/Behavioral: Negative.  Negative for depression and memory loss. The patient is not nervous/anxious and does not have insomnia.   All other systems reviewed and are negative.  Performance status  (ECOG): 1  Vitals Blood pressure (!) 130/59, pulse 79, temperature 97.7 F (36.5 C), temperature source Tympanic, resp. rate 16, weight 121 lb 14.6 oz (55.3 kg), SpO2 100 %.   Physical Exam  Constitutional: She is oriented to person, place, and time.  Thin woman sitting comfortably in the exam room in no acute distress.  HENT:  Head: Normocephalic and atraumatic.  Right Ear: Tympanic membrane, external ear and ear canal normal.  Left Ear: Tympanic membrane, external ear and ear canal normal.  Nose: Nose normal. No rhinorrhea.  Mouth/Throat: Oropharynx is clear and moist. No oropharyngeal exudate.  Wearing a scarf and mask.   Eyes: Pupils are equal, round, and reactive to light. Conjunctivae and EOM are normal. Right eye exhibits no discharge. Left eye exhibits no discharge. No scleral icterus.  Green eyes.   Neck: Normal range of motion. Neck supple. No JVD present.  Cardiovascular: Normal rate, regular rhythm and normal heart sounds. Exam reveals no gallop and no friction rub.  No murmur heard. Pulmonary/Chest: Effort normal and breath sounds normal. No respiratory distress. She has no wheezes. She has no rales.  Abdominal: Soft. Bowel sounds are normal. She exhibits no distension and no mass. There is no abdominal tenderness. There is no rebound and no guarding.  Musculoskeletal: Normal range of motion.        General: No edema.  Lymphadenopathy:    She has no cervical adenopathy.    She has axillary adenopathy.       Right: Inguinal adenopathy present. No supraclavicular adenopathy present.       Left: Inguinal adenopathy present. No supraclavicular adenopathy present.  Multiple left > right axillary nodes.  Nodes up to 2-2.5 cm.  Multiple right > left inguinal nodes.  Right inguinal nodes fingertip size.  Neurological: She is alert and oriented to person, place, and time. She has normal reflexes.  Skin: Skin is warm and dry. Rash (lower extremity rash, improved) noted. She is not  diaphoretic. No pallor.  No blisters or ulcerations.  Psychiatric: She has a normal mood and affect. Her behavior is normal. Judgment and thought content normal.  Nursing note and vitals reviewed.   Orders Only on 03/25/2019  Component Date Value Ref Range Status  . Retic Ct Pct 03/25/2019 0.7  0.4 - 3.1 % Final  . RBC. 03/25/2019 2.69* 3.87 - 5.11 MIL/uL Final  . Retic Count, Absolute 03/25/2019 18.0* 19.0 - 186.0 K/uL Final  . Immature Retic Fract 03/25/2019 9.6  2.3 - 15.9 % Final   Performed at [facility], [address] [place], [place] [address]  . LDH 03/25/2019 184  98 - 192 U/L Final   Performed at [facility], [address] [place], [place] [address]  . Total Protein 03/25/2019 6.3* 6.5 - 8.1 g/dL Final  . Albumin 03/25/2019 4.4  3.5 - 5.0 g/dL Final  . AST 03/25/2019 23  15 - 41 U/L Final  . ALT 03/25/2019 16  0 - 44 U/L Final  . Alkaline Phosphatase 03/25/2019 64  38 - 126 U/L Final  . Total Bilirubin 03/25/2019 0.6  0.3 - 1.2 mg/dL Final  . Bilirubin, Direct 03/25/2019 <0.1  0.0 - 0.2 mg/dL Final  . Indirect Bilirubin 03/25/2019 NOT CALCULATED  0.3 - 0.9 mg/dL Final   Performed at [facility], [address] [place], [place] [address]  Appointment on 03/25/2019  Component Date Value Ref Range Status  . TSH 03/25/2019 1.713  0.350 - 4.500 uIU/mL Final   Comment: Performed by a 3rd Generation assay with a functional sensitivity of <=0.01 uIU/mL. Performed at [facility], [address] [place], [place] [address]   . Polyspecific AHG test 03/25/2019    Final                   Value:NEG Performed at [facility], [address] [place], [place] [address]   . Uric Acid, Serum 03/25/2019 4.1  2.5 - 7.1 mg/dL Final   Performed at [facility], [address], [place], [place] [address]  . Sodium 03/25/2019 142  135 - 145 mmol/L Final  . Potassium 03/25/2019 4.6  3.5 - 5.1 mmol/L Final  . Chloride  03/25/2019 107  98 - 111 mmol/L Final  . CO2 03/25/2019 24  22 - 32 mmol/L Final  . Glucose, Bld 03/25/2019 104* 70 - 99 mg/dL Final  . BUN 03/25/2019 19  8 - 23 mg/dL Final  . Creatinine, Ser 03/25/2019 0.66  0.44 - 1.00 mg/dL Final  . Calcium 03/25/2019 8.8* 8.9 - 10.3 mg/dL Final  . GFR calc non Af Amer 03/25/2019 >60  >60 mL/min Final  . GFR calc Af Amer 03/25/2019 >60  >60 mL/min Final  . Anion gap 03/25/2019 11  5 - 15 Final   Performed at [facility], [address] [place], [place] [address]  . WBC 03/25/2019 104.5* 4.0 - 10.5 K/uL Final   This critical result has verified and been called to [employer] by Volney Presser on 09 09 2020 at 1130, and has been read back.   Marland Kitchen RBC 03/25/2019 2.63* 3.87 - 5.11 MIL/uL Final  . Hemoglobin 03/25/2019 8.4* 12.0 - 15.0 g/dL Final  . HCT 03/25/2019 27.3* 36.0 - 46.0 % Final  . MCV 03/25/2019 103.8* 80.0 - 100.0 fL Final  . MCH 03/25/2019 31.9  26.0 - 34.0 pg Final  . MCHC 03/25/2019 30.8  30.0 - 36.0 g/dL Final  . RDW 03/25/2019 17.2* 11.5 - 15.5 % Final  . Platelets 03/25/2019 94* 150 - 400 K/uL Final   Comment: Immature Platelet Fraction may be clinically indicated, consider ordering this additional test [id number]   . nRBC 03/25/2019 0.0  0.0 - 0.2 % Final  . Neutrophils Relative % 03/25/2019 2  % Final  . Neutro Abs 03/25/2019 2.2  1.7 - 7.7 K/uL Final  . Lymphocytes Relative 03/25/2019 93  % Final  . Lymphs Abs 03/25/2019 97.4* 0.7 - 4.0 K/uL Final  . Monocytes Relative 03/25/2019 5  % Final  . Monocytes Absolute 03/25/2019 4.7* 0.1 - 1.0 K/uL Final  . Eosinophils Relative 03/25/2019 0  % Final  . Eosinophils Absolute 03/25/2019 0.0  0.0 - 0.5 K/uL Final  . Basophils Relative 03/25/2019 0  % Final  . Basophils Absolute 03/25/2019 0.0  0.0 - 0.1 K/uL Final  . Immature Granulocytes 03/25/2019 0  % Final  . Abs Immature Granulocytes 03/25/2019 0.10* 0.00 - 0.07 K/uL Final   Performed at [facility]  [facility],  [address], [place], [place] [address]    Assessment:  Maureen [name] is a 78 y.o. female with stage IV CLL. She was diagnosed in [place] in 2002. Bone marrowon 03/2008 revealed 90% cellularity with diffuse involvement by CLL. Karyotype was normal. FISHstudies on 08/19/2018 revealed loss of one ATM, homologous deletion of 13q, and normal CCDN1/IGH, chromosome 12, and TP53.  She received FCRx 4 cycles (last 08/06/2008). She had a partial remission. She received single agent Rituxanx 7. Treatment was held secondary to neutropenia.  She received 6 cycles of Gazyva(07/26/2016 - 01/23/2017).  Chest, abdomen, and pelvisCTon 10/17/2017 revealed significant partial treatment response. There was mild axillary, mediastinal, hilar, retroperitoneal, mesenteric and bilateral pelvic adenopathy, all significantly decreased in the interval. There was mild splenomegaly (13 cm), significantly decreased. There was a new 3.4 cm masslike focus of hypoenhancement in the posterior upper right kidney, indeterminate. There was a stable 3.1 cm infrarenal abdominal aortic aneurysm  Abdomen and pelvisCTon 02/13/2018 revealed patchy enhancement of the right kidney concerning for pyelonephritis. Additionally there was urothelial thickening and hyperenhancement involving the right renal collecting system and ureter compatible with ascending urinary tract infection. There was similar-appearing abdominal and retroperitoneal adenopathy.  She has a history of a skin rashs/p anterior thigh biopsy in 2012. 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 She was ttreated with a course of doxycycline, topical Clobetasol, and hydroxyzine for itching. She states that she gets the rash on her legs in the summertime.  She was diagnosed with B12 deficiencyin 2012. Shere-started oral B12on 09/24/2018. B12 was 237 on 03/11/202 and570 on 10/21/2018.   Symptomatically, she feels tired.  She has increasing adenopathy.  WBC 104,500.  Plan: 1.   Labs today: CBC with diff, CMP, uric acid, TSH, DAT. 2. Chronic lymphocytic leukemia (CLL)             Symptomatically, she feels fatigued.             Weight has improved since last visit.  Exam reveals increasing adenopathy             WBC has increased from 35,100 to 121,800 in the past 4 months.                         WBC doubling time is 2 months.             WBC today is 104,500.             Review plan to initiate ibrutinib today.                         Side effects re-reviewed.                         Review and increase in WBC before improvement.  Discuss plan for initial close monitoring then monthly. 3. Anemia             Hemoglobin has decreased from 12.1 to 9.4 to 8.4over past 2 months.             Patient denies any bleeding.             Etiology felt secondary to CLL.             Haptoglobin normal today.  No evidence of hemolysis.             Patient on B12 and folate.  TSH normal today.             Ferritin was 244 on 03/18/2019.             Coombs is negative today. 4.   B12 deficiency B12 level was 570 on 10/21/2018. Folate was 36 on 09/24/2018. Patient continues oral S97 and folic acid. 5.Elevated iron saturation Iron saturation was 49% on 09/24/2018. Iron saturation was 35% on 01/14/2019.             Iron saturation was 86% on 03/18/2019. Consider hemochromatosis testing in the future. 6.Hyperkalemia, resolved             Potassium 4.6.             Monitor for tumor lysis. 7.   Skin lesions Patient seen by dermatology.             Continue to monitor. 8.RTC in 1 week for MD assessment and labs (CBC with diff, CMP, LDH, uric acid).  I discussed the assessment and treatment plan with the patient.  The patient was provided an opportunity to ask questions and all were  answered.  The patient agreed with the plan and demonstrated an understanding of the instructions.  The patient was advised to call back if the symptoms worsen or if the condition fails to improve as anticipated.   Lequita Asal, MD, PhD    03/25/2019, 3:35 PM

## 2019-03-24 NOTE — Telephone Encounter (Signed)
Medication scheduled to be mailed 9/8 for delivery 9/9 from [facility].

## 2019-03-25 ENCOUNTER — Other Ambulatory Visit: Payer: Self-pay

## 2019-03-25 ENCOUNTER — Telehealth: Payer: Self-pay

## 2019-03-25 ENCOUNTER — Encounter: Payer: Self-pay | Admitting: Hematology and Oncology

## 2019-03-25 ENCOUNTER — Inpatient Hospital Stay: Payer: PPO | Admitting: Hematology and Oncology

## 2019-03-25 ENCOUNTER — Inpatient Hospital Stay: Payer: PPO

## 2019-03-25 VITALS — BP 130/59 | HR 79 | Temp 97.7°F | Resp 16 | Wt 121.9 lb

## 2019-03-25 DIAGNOSIS — E875 Hyperkalemia: Secondary | ICD-10-CM

## 2019-03-25 DIAGNOSIS — C9110 Chronic lymphocytic leukemia of B-cell type not having achieved remission: Secondary | ICD-10-CM | POA: Diagnosis not present

## 2019-03-25 DIAGNOSIS — R790 Abnormal level of blood mineral: Secondary | ICD-10-CM

## 2019-03-25 DIAGNOSIS — E538 Deficiency of other specified B group vitamins: Secondary | ICD-10-CM

## 2019-03-25 DIAGNOSIS — D649 Anemia, unspecified: Secondary | ICD-10-CM | POA: Diagnosis not present

## 2019-03-25 DIAGNOSIS — Z7189 Other specified counseling: Secondary | ICD-10-CM | POA: Diagnosis not present

## 2019-03-25 LAB — CBC WITH DIFFERENTIAL/PLATELET
Abs Immature Granulocytes: 0.1 10*3/uL — ABNORMAL HIGH (ref 0.00–0.07)
Basophils Absolute: 0 10*3/uL (ref 0.0–0.1)
Basophils Relative: 0 %
Eosinophils Absolute: 0 10*3/uL (ref 0.0–0.5)
Eosinophils Relative: 0 %
HCT: 27.3 % — ABNORMAL LOW (ref 36.0–46.0)
Hemoglobin: 8.4 g/dL — ABNORMAL LOW (ref 12.0–15.0)
Immature Granulocytes: 0 %
Lymphocytes Relative: 93 %
Lymphs Abs: 97.4 10*3/uL — ABNORMAL HIGH (ref 0.7–4.0)
MCH: 31.9 pg (ref 26.0–34.0)
MCHC: 30.8 g/dL (ref 30.0–36.0)
MCV: 103.8 fL — ABNORMAL HIGH (ref 80.0–100.0)
Monocytes Absolute: 4.7 10*3/uL — ABNORMAL HIGH (ref 0.1–1.0)
Monocytes Relative: 5 %
Neutro Abs: 2.2 10*3/uL (ref 1.7–7.7)
Neutrophils Relative %: 2 %
Platelets: 94 10*3/uL — ABNORMAL LOW (ref 150–400)
RBC: 2.63 MIL/uL — ABNORMAL LOW (ref 3.87–5.11)
RDW: 17.2 % — ABNORMAL HIGH (ref 11.5–15.5)
WBC: 104.5 10*3/uL (ref 4.0–10.5)
nRBC: 0 % (ref 0.0–0.2)

## 2019-03-25 LAB — BASIC METABOLIC PANEL
Anion gap: 11 (ref 5–15)
BUN: 19 mg/dL (ref 8–23)
CO2: 24 mmol/L (ref 22–32)
Calcium: 8.8 mg/dL — ABNORMAL LOW (ref 8.9–10.3)
Chloride: 107 mmol/L (ref 98–111)
Creatinine, Ser: 0.66 mg/dL (ref 0.44–1.00)
GFR calc Af Amer: >60 mL/min (ref >60)
GFR calc non Af Amer: >60 mL/min (ref >60)
Glucose, Bld: 104 mg/dL — ABNORMAL HIGH (ref 70–99)
Potassium: 4.6 mmol/L (ref 3.5–5.1)
Sodium: 142 mmol/L (ref 135–145)

## 2019-03-25 LAB — RETICULOCYTES
Immature Retic Fract: 9.6 % (ref 2.3–15.9)
RBC.: 2.69 MIL/uL — ABNORMAL LOW (ref 3.87–5.11)
Retic Count, Absolute: 18 10*3/uL — ABNORMAL LOW (ref 19.0–186.0)
Retic Ct Pct: 0.7 % (ref 0.4–3.1)

## 2019-03-25 LAB — TSH: TSH: 1.713 u[IU]/mL (ref 0.350–4.500)

## 2019-03-25 LAB — HEPATIC FUNCTION PANEL
ALT: 16 U/L (ref 0–44)
AST: 23 U/L (ref 15–41)
Albumin: 4.4 g/dL (ref 3.5–5.0)
Alkaline Phosphatase: 64 U/L (ref 38–126)
Bilirubin, Direct: <0.1 mg/dL (ref 0.0–0.2)
Total Bilirubin: 0.6 mg/dL (ref 0.3–1.2)
Total Protein: 6.3 g/dL — ABNORMAL LOW (ref 6.5–8.1)

## 2019-03-25 LAB — LACTATE DEHYDROGENASE: LDH: 184 U/L (ref 98–192)

## 2019-03-25 LAB — DAT, POLYSPECIFIC AHG (ARMC ONLY): Polyspecific AHG test: NEGATIVE

## 2019-03-25 LAB — URIC ACID: Uric Acid, Serum: 4.1 mg/dL (ref 2.5–7.1)

## 2019-03-25 NOTE — Progress Notes (Signed)
Pt here for follow up. Denies any concerns.  

## 2019-03-25 NOTE — Telephone Encounter (Signed)
Spoke with cathy from the lab  crital lab WBC 104.5. Dr Mike Gip was made aware.

## 2019-03-31 NOTE — Progress Notes (Signed)
[facility]  [address] [place], [place] [address] Phone: [phone]  Fax: [phone]   Clinic Day:  04/01/2019  Referring physician: Juline Patch, MD  Chief Complaint: Maureen [name] is a 78 y.o. female with recurrent stage IV CLL who isevaluated for 1 week assessment on ibrutinib.   HPI: The patient was last seen in the medical oncology clinic on 03/18/2019. At that time, he felt tired.  She had increasing adenopathy.  CBC revealed a hematocrit of 27.3, hemoglobin 8.4, MCV 103.8, platelets 94,000, white count 104,500 with an ANC of 2200.  ALC was 97,400.  She began ibrutinib on 03/25/2019.  Labs on 04/01/2019 revealed a hematocrit 26.1, hemoglobin 6.9, MCV 111.1, platelets 63,000, WBC 238,100. LDH was 149.   During the interim, she denies fever, chills, or night sweats. She describes some mild fatigue.   Based on her hemoglobin, we discussed a red cell transfusion; she declines. She would like to wait until next week to make a decision.  Platelet count is has decreased.  She denies any excess bruising or bleeding.  She denies recent use of aspirin or ibuprofen    Past Medical History:  Diagnosis Date  . Anemia   . CLL (chronic lymphocytic leukemia) ([place]) 2002  . CLL (chronic lymphocytic leukemia) ([place])   . Itching   . Vitamin B 12 deficiency     Past Surgical History:  Procedure Laterality Date  . breeast mass removed  1988   benign    Family History  Family history unknown: Yes    Social History:  reports that she has been smoking cigarettes. She has a 28.00 pack-year smoking history. She has never used smokeless tobacco. She reports current alcohol use of about 1.0 standard drinks of alcohol per week. She reports that she does not use drugs. She drinks a glass of wine with dinner "once in awhile". She stopped smoking 03/04/2019. She started smoking at age 51 and smoked 10-15 cigarettes/day. She is from [place]. 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 Her husband would often do tours of duty in [place].  She lives in [place].  The patient is alone today.  Allergies: No Known Allergies  Current Medications: Current Outpatient Medications  Medication Sig Dispense Refill  . clindamycin (CLEOCIN T) 1 % external solution APPLY SOLUTION TOPICALLY TWICE DAILY TO SPOT TREAT    . folic acid (FOLVITE) 517 MCG tablet Take 800 mcg by mouth 3 (three) times a week.     . hydrOXYzine (ATARAX/VISTARIL) 25 MG tablet TAKE 1 TO 2 TABLETS BY MOUTH ONCE DAILY AT NIGHT AT BEDTIME    . Ibrutinib 420 MG TABS Take 420 mg by mouth daily. 30 tablet 5  . vitamin B-12 (CYANOCOBALAMIN) 1000 MCG tablet Take 1,000 mcg by mouth daily.    . Ibuprofen 200 MG CAPS Take 1 capsule by mouth as needed (PAIN).     No current facility-administered medications for this visit.     Review of Systems  Constitutional: Positive for weight loss (1 lb). Negative for chills, diaphoresis, fever and malaise/fatigue.       Feels "ok".  Little tired.  HENT: Negative.  Negative for congestion, ear discharge, ear pain, hearing loss, sinus pain and sore throat.   Eyes: Negative.  Negative for blurred vision, double vision, photophobia and pain.  Respiratory: Negative.  Negative for cough, sputum production, shortness of breath and wheezing.   Cardiovascular: Negative.  Negative for chest  pain, palpitations, orthopnea, leg swelling and PND.  Gastrointestinal: Negative for abdominal pain, blood in stool, constipation, diarrhea, melena, nausea and vomiting.       Poor appetite.  Genitourinary: Negative.  Negative for dysuria, frequency, hematuria and urgency.  Musculoskeletal: Negative.  Negative for back pain, falls, joint pain and myalgias.  Skin: Positive for rash (improved). Negative for itching.       Skin lesions- chronic recurrent.  Neurological: Negative.  Negative for dizziness, tingling, sensory change, speech change, focal weakness,  weakness and headaches.  Endo/Heme/Allergies: Negative.   Psychiatric/Behavioral: Negative.  Negative for depression and memory loss. The patient is not nervous/anxious and does not have insomnia.   All other systems reviewed and are negative.  Performance status (ECOG): 1 - Symptomatic but completely ambulatory  Vitals Blood pressure (!) 130/51, pulse 68, temperature 98.1 F (36.7 C), temperature source Oral, resp. rate 18, height _0  (1.702 m), weight 120 lb 0.7 oz (54.5 kg), SpO2 100 %.   Wt Readings from Last 3 Encounters:  04/01/19 120 lb 0.7 oz (54.5 kg)  03/25/19 121 lb 14.6 oz (55.3 kg)  03/18/19 119 lb 13.1 oz (54.3 kg)    Physical Exam  Constitutional: She is oriented to person, place, and time. Face mask in place.  Thin woman sitting comfortably in the exam room in no acute distress.  HENT:  Head: Normocephalic and atraumatic.  Right Ear: Tympanic membrane, external ear and ear canal normal.  Left Ear: Tympanic membrane, external ear and ear canal normal.  Nose: Nose normal. No rhinorrhea.  Mouth/Throat: Oropharynx is clear and moist. No oropharyngeal exudate.  Wearing a scarf and mask.   Eyes: Pupils are equal, round, and reactive to light. Conjunctivae and EOM are normal. Right eye exhibits no discharge. Left eye exhibits no discharge. No scleral icterus.  Green eyes.   Neck: Normal range of motion. Neck supple. No JVD present.  Cardiovascular: Normal rate, regular rhythm and normal heart sounds. Exam reveals no gallop and no friction rub.  No murmur heard. Pulmonary/Chest: Effort normal and breath sounds normal. No respiratory distress. She has no wheezes. She has no rales.  Abdominal: Soft. Bowel sounds are normal. She exhibits no distension and no mass. There is no abdominal tenderness. There is no rebound and no guarding.  Musculoskeletal: Normal range of motion.        General: No edema.  Lymphadenopathy:    She has no cervical adenopathy.    She has axillary  adenopathy (lymph nodes arer softer/flatter).       Right: Inguinal (shoddy) and supraclavicular (5-6 mm) adenopathy present.       Left: No supraclavicular adenopathy present.  Neurological: She is alert and oriented to person, place, and time. She has normal reflexes.  Skin: Skin is warm and dry. No rash noted. She is not diaphoretic. No pallor.  No blisters or ulcerations.  Psychiatric: She has a normal mood and affect. Her behavior is normal. Judgment and thought content normal.  Nursing note and vitals reviewed.   Appointment on 04/01/2019  Component Date Value Ref Range Status  . Uric Acid, Serum 04/01/2019 4.1  2.5 - 7.1 mg/dL Final   Performed at [facility], [address] [place], [place] [address]  . LDH 04/01/2019 149  98 - 192 U/L Final   Performed at [facility], [address], [place], [place] [address]  . Sodium 04/01/2019 140  135 - 145 mmol/L Final  . Potassium 04/01/2019 4.7  3.5 - 5.1 mmol/L Final  . Chloride 04/01/2019 108  98 - 111 mmol/L Final  . CO2 04/01/2019 24  22 - 32 mmol/L Final  . Glucose, Bld 04/01/2019 99  70 - 99 mg/dL Final  . BUN 04/01/2019 22  8 - 23 mg/dL Final  . Creatinine, Ser 04/01/2019 0.57  0.44 - 1.00 mg/dL Final  . Calcium 04/01/2019 8.7* 8.9 - 10.3 mg/dL Final  . Total Protein 04/01/2019 6.0* 6.5 - 8.1 g/dL Final  . Albumin 04/01/2019 4.2  3.5 - 5.0 g/dL Final  . AST 04/01/2019 16  15 - 41 U/L Final  . ALT 04/01/2019 12  0 - 44 U/L Final  . Alkaline Phosphatase 04/01/2019 66  38 - 126 U/L Final  . Total Bilirubin 04/01/2019 0.9  0.3 - 1.2 mg/dL Final  . GFR calc non Af Amer 04/01/2019 >60  >60 mL/min Final  . GFR calc Af Amer 04/01/2019 >60  >60 mL/min Final  . Anion gap 04/01/2019 8  5 - 15 Final   Performed at [facility], [address] [place], [place] [address]  . WBC 04/01/2019 238.1* 4.0 - 10.5 K/uL Final   This critical result has verified and been called to COURTNEY GRIFFIT by  Woodfin Ganja on 09 16 2020 at 1107, and has been read back.   Marland Kitchen RBC 04/01/2019 2.35* 3.87 - 5.11 MIL/uL Final  . Hemoglobin 04/01/2019 6.9* 12.0 - 15.0 g/dL Final  . HCT 04/01/2019 26.1* 36.0 - 46.0 % Final  . MCV 04/01/2019 111.1* 80.0 - 100.0 fL Final  . MCH 04/01/2019 29.4  26.0 - 34.0 pg Final  . MCHC 04/01/2019 26.4* 30.0 - 36.0 g/dL Final  . RDW 04/01/2019 22.5* 11.5 - 15.5 % Final  . Platelets 04/01/2019 63* 150 - 400 K/uL Final   Comment: Immature Platelet Fraction may be clinically indicated, consider ordering this additional test [id number]   . nRBC 04/01/2019 0.0  0.0 - 0.2 % Final  . Neutrophils Relative % 04/01/2019 1  % Final  . Neutro Abs 04/01/2019 2.9  1.7 - 7.7 K/uL Final  . Lymphocytes Relative 04/01/2019 97  % Final  . Lymphs Abs 04/01/2019 229.9* 0.7 - 4.0 K/uL Final  . Monocytes Relative 04/01/2019 2  % Final  . Monocytes Absolute 04/01/2019 4.9* 0.1 - 1.0 K/uL Final  . Eosinophils Relative 04/01/2019 0  % Final  . Eosinophils Absolute 04/01/2019 0.0  0.0 - 0.5 K/uL Final  . Basophils Relative 04/01/2019 0  % Final  . Basophils Absolute 04/01/2019 0.0  0.0 - 0.1 K/uL Final  . WBC Morphology 04/01/2019 ABSOLUTE LYMPHOCYTOSIS   Final  . Smear Review 04/01/2019 Normal platelet morphology   Final  . Immature Granulocytes 04/01/2019 0  % Final  . Abs Immature Granulocytes 04/01/2019 0.24* 0.00 - 0.07 K/uL Final  . Stomatocytes 04/01/2019 PRESENT   Final   Performed at [facility], [address] [place], [place] [address]    Assessment:  LEALER MARSLAND is a 78 y.o. female with stage IV CLL. She was diagnosed in [place] in 2002. Bone marrowon 03/2008 revealed 90% cellularity with diffuse involvement by CLL. Karyotype was normal. FISHstudies on 08/19/2018 revealed loss of one ATM, homologous deletion of 13q, and normal CCDN1/IGH, chromosome 12, and TP53.  She received FCRx 4 cycles (last 08/06/2008). She had a partial remission. She received  single agent Rituxanx 7. Treatment was held secondary to neutropenia.  She received 6 cycles of Gazyva(07/26/2016 - 01/23/2017).  Chest, abdomen, and pelvisCTon 10/17/2017 revealed significant partial treatment response. There was mild axillary, mediastinal, hilar, retroperitoneal, mesenteric and bilateral pelvic adenopathy, all significantly decreased in the interval. There was mild splenomegaly (13 cm), significantly decreased. There was a new 3.4 cm masslike focus of hypoenhancement in the posterior upper right kidney, indeterminate. There was a stable 3.1 cm infrarenal abdominal aortic aneurysm  Abdomen and pelvisCTon 02/13/2018 revealed patchy enhancement of the right kidney concerning for pyelonephritis. Additionally there was urothelial thickening and hyperenhancement involving the right renal collecting system and ureter compatible with ascending urinary tract infection. There was similar-appearing abdominal and retroperitoneal adenopathy.  She began ibrutinib on 03/25/2019.  She has a history of a skin rashs/p anterior thigh biopsy in 2012. 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 She was ttreated with a course of doxycycline, topical Clobetasol, and hydroxyzine for itching. She states that she gets the rash on her legs in the summertime.  She was diagnosed with B12 deficiencyin 2012. Shere-started oral B12on 09/24/2018. B12 was 237 on 03/11/202 and570 on 10/21/2018.  Symptomatically, she feels fatigued.  She denies any chest pain, shortness of breath or dizziness.   Exam reveals decreasing adenopathy.  Hemoglobin 6.9.  Platelets 63,000.  WBC 238,100.  Plan: 1.   Labs today:CBC with diff, CMP, LDH, uric acid. 2. Chronic lymphocytic leukemia (CLL) Symptomatically, she feels fatigued and a little more tired.    Hematocrit 26.1.  Hemoglobin 6.9.  MCV 111.1.  Platelets 63,000.  WBC  238,100.  She is day 8 of ibrutinib (began 03/25/2019).  Exam reveals decreasing adenopathy. Review expected increase in WBC.  Platelet count has decreased and is likely secondary to marrow replacement and ibrutinib.  No evidence of tumor lysis.  Continue to monitor closely. 3. Anemia Hemoglobin has decreased from 8.4 to 6.9 over the past week. Patient denies any bleeding.    Ferritin, B12, and folate are adequate.    Coombs, haptoglobin and LDH normal with no evidence of hemolysis   Retic count is 0.7% (low) and thus felt secondary to CLL.  Discuss consideration of transfusion.  Typically patients transfused when hemoglobin < 7-8 or symptomatic.  Patient declines transfusion.  Discuss close monitoring.  Patient to call between appointments if any increased symptoms. 4.B12 deficiency B12 level was 570 on 10/21/2018. Folate was 36 on 09/24/2018. Continue oral X44 and folic acid. 5.Elevated iron saturation Iron saturation was 49% on03/05/2019. Iron saturation was 35%on 01/14/2019. Iron saturation was 86% on 03/18/2019. Continue to monitor. 6.   RTC in 1 week for MD assessment, labs (CBC with diff, BMP, LDH, uric acid, hold tube- day before), and +/- PRBC transfusion.  I discussed the assessment and treatment plan with the patient.  The patient was provided an opportunity to ask questions and all were answered.  The patient agreed with the plan and demonstrated an understanding of the instructions.  The patient was advised to call back if the symptoms worsen or if the condition fails to improve as anticipated.  I provided 15 minutes of face-to-face time during this this encounter and > 50% was spent counseling as documented under my assessment and plan.    Lequita Asal, MD, PhD    04/01/2019, 11:26 AM  I, Jacqualyn Posey, am acting as Education administrator for  [employer]. Mike Gip, MD, PhD.  I, Melissa C. Mike Gip, MD, have reviewed the above documentation for accuracy and completeness, and I agree with the above.

## 2019-04-01 ENCOUNTER — Encounter: Payer: Self-pay | Admitting: Hematology and Oncology

## 2019-04-01 ENCOUNTER — Inpatient Hospital Stay: Payer: PPO | Admitting: Hematology and Oncology

## 2019-04-01 ENCOUNTER — Inpatient Hospital Stay: Payer: PPO

## 2019-04-01 ENCOUNTER — Other Ambulatory Visit: Payer: Self-pay

## 2019-04-01 ENCOUNTER — Telehealth: Payer: Self-pay

## 2019-04-01 VITALS — BP 130/51 | HR 68 | Temp 98.1°F | Resp 18 | Ht 67.0 in | Wt 120.0 lb

## 2019-04-01 DIAGNOSIS — R790 Abnormal level of blood mineral: Secondary | ICD-10-CM

## 2019-04-01 DIAGNOSIS — E538 Deficiency of other specified B group vitamins: Secondary | ICD-10-CM

## 2019-04-01 DIAGNOSIS — D649 Anemia, unspecified: Secondary | ICD-10-CM

## 2019-04-01 DIAGNOSIS — C9110 Chronic lymphocytic leukemia of B-cell type not having achieved remission: Secondary | ICD-10-CM

## 2019-04-01 DIAGNOSIS — E875 Hyperkalemia: Secondary | ICD-10-CM

## 2019-04-01 LAB — COMPREHENSIVE METABOLIC PANEL
ALT: 12 U/L (ref 0–44)
AST: 16 U/L (ref 15–41)
Albumin: 4.2 g/dL (ref 3.5–5.0)
Alkaline Phosphatase: 66 U/L (ref 38–126)
Anion gap: 8 (ref 5–15)
BUN: 22 mg/dL (ref 8–23)
CO2: 24 mmol/L (ref 22–32)
Calcium: 8.7 mg/dL — ABNORMAL LOW (ref 8.9–10.3)
Chloride: 108 mmol/L (ref 98–111)
Creatinine, Ser: 0.57 mg/dL (ref 0.44–1.00)
GFR calc Af Amer: >60 mL/min (ref >60)
GFR calc non Af Amer: >60 mL/min (ref >60)
Glucose, Bld: 99 mg/dL (ref 70–99)
Potassium: 4.7 mmol/L (ref 3.5–5.1)
Sodium: 140 mmol/L (ref 135–145)
Total Bilirubin: 0.9 mg/dL (ref 0.3–1.2)
Total Protein: 6 g/dL — ABNORMAL LOW (ref 6.5–8.1)

## 2019-04-01 LAB — CBC WITH DIFFERENTIAL/PLATELET
Abs Immature Granulocytes: 0.24 10*3/uL — ABNORMAL HIGH (ref 0.00–0.07)
Basophils Absolute: 0 10*3/uL (ref 0.0–0.1)
Basophils Relative: 0 %
Eosinophils Absolute: 0 10*3/uL (ref 0.0–0.5)
Eosinophils Relative: 0 %
HCT: 26.1 % — ABNORMAL LOW (ref 36.0–46.0)
Hemoglobin: 6.9 g/dL — ABNORMAL LOW (ref 12.0–15.0)
Immature Granulocytes: 0 %
Lymphocytes Relative: 97 %
Lymphs Abs: 229.9 10*3/uL — ABNORMAL HIGH (ref 0.7–4.0)
MCH: 29.4 pg (ref 26.0–34.0)
MCHC: 26.4 g/dL — ABNORMAL LOW (ref 30.0–36.0)
MCV: 111.1 fL — ABNORMAL HIGH (ref 80.0–100.0)
Monocytes Absolute: 4.9 10*3/uL — ABNORMAL HIGH (ref 0.1–1.0)
Monocytes Relative: 2 %
Neutro Abs: 2.9 10*3/uL (ref 1.7–7.7)
Neutrophils Relative %: 1 %
Platelets: 63 10*3/uL — ABNORMAL LOW (ref 150–400)
RBC: 2.35 MIL/uL — ABNORMAL LOW (ref 3.87–5.11)
RDW: 22.5 % — ABNORMAL HIGH (ref 11.5–15.5)
Smear Review: NORMAL
WBC: 238.1 10*3/uL (ref 4.0–10.5)
nRBC: 0 % (ref 0.0–0.2)

## 2019-04-01 LAB — LACTATE DEHYDROGENASE: LDH: 149 U/L (ref 98–192)

## 2019-04-01 LAB — URIC ACID: Uric Acid, Serum: 4.1 mg/dL (ref 2.5–7.1)

## 2019-04-01 NOTE — Progress Notes (Signed)
No new changes noted today 

## 2019-04-01 NOTE — Telephone Encounter (Signed)
Crital labs WBC 238.5 ( Marissa from the lab called in). Dr Mike Gip notified

## 2019-04-06 ENCOUNTER — Encounter: Payer: Self-pay | Admitting: Hematology and Oncology

## 2019-04-06 NOTE — Progress Notes (Signed)
No new changes noted. The patient Name and DOB has been verified by phone.

## 2019-04-07 NOTE — Progress Notes (Signed)
[facility]  [address] [place], [place] [address] Phone: [phone]  Fax: [phone]   Clinic Day:  04/08/2019  Referring physician: Juline Patch, MD  Chief Complaint: Maureen [name] is a 78 y.o. female with recurrent stage IV CLL who is seen for 1 weekassessment onibrutinib.  HPI: The patient was last seen in the medical oncology clinic on 04/01/2019. At that time, she felt fatigued.  She denied any chest pain, shortness of breath or dizziness.  Exam revealed decreasing adenopathy.  Hematocrit was 26.1, hemoglobin 6.9, MCV 111.1, platelets 63,000, and WBC 238,100.  During the interim, she continues to feel fatigued. This morning, she noted some red discoloration in her urine when she first used the bathroom; she went a second time and it was gone. She believes that her lymph nodes in her neck and groin have continued to shrink. She notes that she continues to drink and eat regularly.    Past Medical History:  Diagnosis Date  . Anemia   . CLL (chronic lymphocytic leukemia) ([place]) 2002  . CLL (chronic lymphocytic leukemia) ([place])   . Itching   . Vitamin B 12 deficiency     Past Surgical History:  Procedure Laterality Date  . breeast mass removed  1988   benign    Family History  Family history unknown: Yes    Social History:  reports that she has been smoking cigarettes. She has a 28.00 pack-year smoking history. She has never used smokeless tobacco. She reports current alcohol use of about 1.0 standard drinks of alcohol per week. She reports that she does not use drugs. She drinks a glass of wine with dinner "once in awhile". She stopped smoking 03/04/2019. She started smoking at age 23 and smoked 10-15 cigarettes/day. She is from [place]. 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 Her husband would often do tours of duty in [place]. The patient is alone today.  Allergies: No Known Allergies  Current Medications:  Current Outpatient Medications  Medication Sig Dispense Refill  . folic acid (FOLVITE) 754 MCG tablet Take 800 mcg by mouth 3 (three) times a week.     . Ibrutinib 420 MG TABS Take 420 mg by mouth daily. 30 tablet 5  . vitamin B-12 (CYANOCOBALAMIN) 1000 MCG tablet Take 1,000 mcg by mouth daily.    . clindamycin (CLEOCIN T) 1 % external solution APPLY SOLUTION TOPICALLY TWICE DAILY TO SPOT TREAT    . hydrOXYzine (ATARAX/VISTARIL) 25 MG tablet TAKE 1 TO 2 TABLETS BY MOUTH ONCE DAILY AT NIGHT AT BEDTIME    . Ibuprofen 200 MG CAPS Take 1 capsule by mouth as needed (PAIN).     No current facility-administered medications for this visit.     Review of Systems  Constitutional: Positive for malaise/fatigue. Negative for chills, diaphoresis, fever and weight loss (up 1 pound).       "I'm ok".  HENT: Negative.  Negative for congestion, ear pain, nosebleeds, sinus pain and sore throat.   Eyes: Negative.  Negative for blurred vision, double vision and photophobia.  Respiratory: Negative.  Negative for cough, hemoptysis, sputum production and shortness of breath.   Cardiovascular: Negative.  Negative for chest pain, palpitations, orthopnea and leg swelling.  Gastrointestinal: Negative.  Negative for abdominal pain, blood in stool, constipation, diarrhea, melena, nausea and vomiting.  Genitourinary: Positive for hematuria (reddish urine x 1). Negative for dysuria, flank pain, frequency and urgency.  Musculoskeletal: Negative.  Negative for back  pain, falls, joint pain, myalgias and neck pain.  Skin: Negative.  Negative for rash.  Neurological: Negative.  Negative for dizziness, sensory change, speech change, focal weakness, weakness and headaches.  Endo/Heme/Allergies: Negative.  Does not bruise/bleed easily.  Psychiatric/Behavioral: Negative.  Negative for depression and memory loss. The patient is not nervous/anxious and does not have insomnia.    Performance status (ECOG): 1 - Symptomatic but  completely ambulatory  Vitals Blood pressure (!) 156/54, pulse 74, temperature 97.8 F (36.6 C), temperature source Tympanic, resp. rate 18, weight 121 lb 0.5 oz (54.9 kg), SpO2 100 %.   Physical Exam  Constitutional: She is oriented to person, place, and time.  Thin woman sitting comfortably in the exam room in no acute distress.  HENT:  Head: Normocephalic and atraumatic.  Right Ear: Tympanic membrane, external ear and ear canal normal.  Left Ear: Tympanic membrane, external ear and ear canal normal.  Nose: Nose normal. No rhinorrhea.  Mouth/Throat: Oropharynx is clear and moist. No oropharyngeal exudate.  Wearing a scarf and mask. Bilateral temporal hair loss.  Eyes: Pupils are equal, round, and reactive to light. Conjunctivae and EOM are normal. Right eye exhibits no discharge. Left eye exhibits no discharge. No scleral icterus.  Green eyes.   Neck: Normal range of motion. Neck supple. No JVD present.  Cardiovascular: Normal rate, regular rhythm and normal heart sounds. Exam reveals no gallop and no friction rub.  No murmur heard. Pulmonary/Chest: Effort normal and breath sounds normal. No respiratory distress. She has no wheezes. She has no rales.  Abdominal: Soft. Bowel sounds are normal. She exhibits no distension and no mass. There is no abdominal tenderness. There is no rebound and no guarding.  No CVA tenderness.  Musculoskeletal: Normal range of motion.        General: No tenderness or edema.  Lymphadenopathy:    She has no cervical adenopathy.    She has axillary adenopathy (decreased number and flatter).       Right: Inguinal adenopathy present. No supraclavicular adenopathy present.       Left: Inguinal adenopathy present. No supraclavicular adenopathy present.  Multiple left > right axillary nodes.  Nodes 1-1.5cm.  Multiple tiny right > left inguinal nodes.  Neurological: She is alert and oriented to person, place, and time. She has normal reflexes.  Skin: Skin is warm  and dry. No rash (thin eschar on lower extremity) noted. She is not diaphoretic. No erythema. No pallor.  No blisters or ulcerations.  Psychiatric: She has a normal mood and affect. Her behavior is normal. Judgment and thought content normal.  Nursing note and vitals reviewed.   Orders Only on 04/08/2019  Component Date Value Ref Range Status  . ABO/RH(D) 04/08/2019 O POS   Final  . Antibody Screen 04/08/2019 NEG   Final  . Sample Expiration 04/08/2019 04/11/2019,2359   Final  . Unit Number 04/08/2019 [id number]   Final  . Blood Component Type 04/08/2019 RBC, LR IRR   Final  . Unit division 04/08/2019 00   Final  . Status of Unit 04/08/2019 ISSUED,FINAL   Final  . Unit tag comment 04/08/2019 IRRADIATED PRODUCT   Final  . Transfusion Status 04/08/2019 OK TO TRANSFUSE   Final  . Crossmatch Result 04/08/2019 COMPATIBLE   Final  . Unit Number 04/08/2019 [id number]   Final  . Blood Component Type 04/08/2019 RCLI PHER 2   Final  . Unit division 04/08/2019 00   Final  . Status of Unit 04/08/2019 ISSUED,FINAL  Final  . Unit tag comment 04/08/2019 IRRADIATED PRODUCT   Final  . Transfusion Status 04/08/2019 OK TO TRANSFUSE   Final  . Crossmatch Result 04/08/2019 COMPATIBLE   Final  . Order Confirmation 04/08/2019    Final                   Value:ORDER PROCESSED BY BLOOD BANK Performed at [facility], [address] [place], [place] [address]   . ISSUE DATE / TIME 04/08/2019 [id number]   Final  . Blood Product Unit Number 04/08/2019 [id number]   Final  . PRODUCT CODE 04/08/2019 [id number]   Final  . Unit Type and Rh 04/08/2019 5100   Final  . Blood Product Expiration Date 04/08/2019 [id number]   Final  . ISSUE DATE / TIME 04/08/2019 [id number]   Final  . Blood Product Unit Number 04/08/2019 [id number]   Final  . PRODUCT CODE 04/08/2019 [id number]   Final  . Unit Type and Rh 04/08/2019 5100   Final  . Blood Product Expiration Date 04/08/2019 [id number]   Final   Clinical Support on 04/08/2019  Component Date Value Ref Range Status  . Blood Bank Specimen 04/08/2019 SAMPLE AVAILABLE FOR TESTING   Final  . Sample Expiration 04/08/2019    Final                   Value:04/11/2019,2359 Performed at [facility], [address], [place], [place] [address]   . Uric Acid, Serum 04/08/2019 4.5  2.5 - 7.1 mg/dL Final   Performed at [facility], [address] [place], [place] [address]  . Sodium 04/08/2019 139  135 - 145 mmol/L Final  . Potassium 04/08/2019 3.9  3.5 - 5.1 mmol/L Final  . Chloride 04/08/2019 106  98 - 111 mmol/L Final  . CO2 04/08/2019 23  22 - 32 mmol/L Final  . Glucose, Bld 04/08/2019 103* 70 - 99 mg/dL Final  . BUN 04/08/2019 17  8 - 23 mg/dL Final  . Creatinine, Ser 04/08/2019 0.62  0.44 - 1.00 mg/dL Final  . Calcium 04/08/2019 8.7* 8.9 - 10.3 mg/dL Final  . GFR calc non Af Amer 04/08/2019 >60  >60 mL/min Final  . GFR calc Af Amer 04/08/2019 >60  >60 mL/min Final  . Anion gap 04/08/2019 10  5 - 15 Final   Performed at [facility], [address], [place], [place] [address]  . LDH 04/08/2019 137  98 - 192 U/L Final   Performed at [facility], [address], [place],  [address]  . WBC 04/08/2019 317.0* 4.0 - 10.5 K/uL Final   Comment: WHITE COUNT CONFIRMED ON [facility] CRITICAL VALUE PROTOCOL THIS CRITICAL RESULT HAS VERIFIED AND BEEN CALLED TO KRISTIN PHILLIPS BY CYNTHIA COX ON 09 23 2020 AT 0948, AND HAS BEEN READ BACK. CALLED _0    . RBC 04/08/2019 2.20* 3.87 - 5.11 MIL/uL Final  . Hemoglobin 04/08/2019 5.7* 12.0 - 15.0 g/dL Final  . HCT 04/08/2019 25.4* 36.0 - 46.0 % Final  . MCV 04/08/2019 115.5* 80.0 - 100.0 fL Final  . MCH 04/08/2019 25.9* 26.0 - 34.0 pg Final  . MCHC 04/08/2019 22.4* 30.0 - 36.0 g/dL Final  . RDW 04/08/2019 Not Measured  11.5 - 15.5 % Final  . Platelets 04/08/2019 85* 150 - 400 K/uL Final   Comment: Immature Platelet Fraction may be  clinically indicated, consider ordering this additional test [id number]   . nRBC 04/08/2019 0.0  0.0 - 0.2 % Final  . Neutrophils Relative % 04/08/2019 1  % Final  . Neutro Abs 04/08/2019 2.5  1.7 - 7.7 K/uL Final  . Lymphocytes Relative 04/08/2019 98  % Final  . Lymphs Abs 04/08/2019 310.8* 0.7 - 4.0 K/uL Final  . Monocytes Relative 04/08/2019 1  % Final  . Monocytes Absolute 04/08/2019 3.4* 0.1 - 1.0 K/uL Final  . Eosinophils Relative 04/08/2019 0  % Final  . Eosinophils Absolute 04/08/2019 0.0  0.0 - 0.5 K/uL Final  . Basophils Relative 04/08/2019 0  % Final  . Basophils Absolute 04/08/2019 0.0  0.0 - 0.1 K/uL Final  . Immature Granulocytes 04/08/2019 0  % Final  . Abs Immature Granulocytes 04/08/2019 0.26* 0.00 - 0.07 K/uL Final   Performed at [facility], [address], [place], [place] [address]  . Specimen Description 04/08/2019    Final                   Value:URINE, RANDOM Performed at [facility], [address] [place], [place] [address]   . Special Requests 04/08/2019    Final                   Value:NONE Performed at [facility], [address] [place], [place] [address]   . Culture 04/08/2019 *  Final                   Value:<10,000 COLONIES/mL INSIGNIFICANT GROWTH Performed at [facility] [address] [place], [place] [address]   . Report Status 04/08/2019 04/10/2019 FINAL   Final  . Color, Urine 04/08/2019 YELLOW  YELLOW Final  . APPearance 04/08/2019 CLEAR  CLEAR Final  . Specific Gravity, Urine 04/08/2019 1.010  1.005 - 1.030 Final  . pH 04/08/2019 6.0  5.0 - 8.0 Final  . Glucose, UA 04/08/2019 NEGATIVE  NEGATIVE mg/dL Final  . Hgb urine dipstick 04/08/2019 SMALL* NEGATIVE Final  . Bilirubin Urine 04/08/2019 NEGATIVE  NEGATIVE Final  . Ketones, ur 04/08/2019 NEGATIVE  NEGATIVE mg/dL Final  . Protein, ur 04/08/2019 TRACE* NEGATIVE mg/dL Final  . Nitrite 04/08/2019 NEGATIVE  NEGATIVE Final  . Chalmers Guest  04/08/2019 TRACE* NEGATIVE Final  . Squamous Epithelial / LPF 04/08/2019 0-5  0 - 5 Final  . WBC, UA 04/08/2019 0-5  0 - 5 WBC/hpf Final  . RBC / HPF 04/08/2019 0-5  0 - 5 RBC/hpf Final  . Bacteria, UA 04/08/2019 NONE SEEN  NONE SEEN Final   Performed at [facility], [address], [place], [place] [address]    Assessment:  Maureen [name] is a 78 y.o. female with stage IV CLL. She was diagnosed in [place] in 2002. Bone marrowon 03/2008 revealed 90% cellularity with diffuse involvement by CLL. Karyotype was normal. FISHstudies on 08/19/2018 revealed loss of one ATM, homologous deletion of 13q, and normal CCDN1/IGH, chromosome 12, and TP53.  She received FCRx 4 cycles (last 08/06/2008). She had a partial remission. She received single agent Rituxanx 7. Treatment was held secondary to neutropenia.  She received 6 cycles of Gazyva(07/26/2016 - 01/23/2017).  Chest, abdomen, and pelvisCTon 10/17/2017 revealed significant partial treatment response. There was mild axillary, mediastinal, hilar, retroperitoneal, mesenteric and bilateral pelvic adenopathy, all significantly decreased in the interval. There was mild splenomegaly (13 cm), significantly decreased. There was a new 3.4 cm masslike focus of hypoenhancement in the posterior upper right kidney, indeterminate. There was a stable 3.1 cm  infrarenal abdominal aortic aneurysm  Abdomen and pelvisCTon 02/13/2018 revealed patchy enhancement of the right kidney concerning for pyelonephritis. Additionally there was urothelial thickening and hyperenhancement involving the right renal collecting system and ureter compatible with ascending urinary tract infection. There was similar-appearing abdominal and retroperitoneal adenopathy.  She began ibrutinib on 03/25/2019.  She has a history of a skin rashs/p anterior thigh biopsy in 2012. 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 She was treated with a course of doxycycline, topical Clobetasol, and hydroxyzine for itching. She states that she gets the rash on her legs in the summertime.  She was diagnosed with B12 deficiencyin 2012. Shere-started oral B12on 09/24/2018. B12 was 237 on 03/11/202 and570 on 10/21/2018.  Symptomatically, she is fatigued.  She describes 1 episode of reddish urine.  Exam reveals decreasing adenopathy.  Plan: 1.   Labs today:  CBC with diff, BMP, LDH, uric acid, hold tube. 2. Chronic lymphocytic leukemia (CLL) Clinically, she is doing well.  Exam reveals decreasing adenopathy. Weight is up 1 pound.  No fevers or sweats. Ibrutinib began on 03/25/2019.  She is tolerating well.  WBC has increased from 104,500 to 317,000.  Lymphocytosis expected with initiation of therapy and typically resolves within 14 weeks.   Monitor for leukostasis (uncommon).  Continue to monitor closely. 3. Anemia Hematocrit 25.4.  Hemoglobin 5.7.  MCV 115.5. Patient denies any bleeding. Etiology felt secondary to CLL.  Retic 0.7% on 03/25/2019. Coombs is negative.  Haptoglobin normal. No evidence of hemolysis. Patient on B12 and folate.TSH normal. Ferritin was244on 03/18/2019.  Discuss PRBC transfusion (1-2 units PRBCs). 4.B12 deficiency B12 level was 570 on 10/21/2018. Folate was 36 on 09/24/2018. Patient continues oral X28 and folic acid. 5.Elevated iron saturation Iron saturation was 86%on 03/18/2019.  Continue to monitor. 6.Hyperkalemia, resolved Potassium3.6. Continue to monitor. 7.Skin lesions Patient seen by dermatology. Lower extremity rash is quiescent. 8.Reddish urine  Isolated episode.  Urinalysis today to r/o hematuria.  9.   RN to call patient with today's lab results and decision re: transfusion. 10.   RTC in 1 week for labs (CBC with diff, BMP, hold tube). 11.   RTC in 2 weeks for MD assessment, labs (CBC with diff, BMP, LDH, uric acid, hold tube- day before), and +/- PRBC transfusion.  Addendum:  Patient decided to receive 1 unit of PRBCs tomorrow and consider additional unit on 04/10/2019.  I discussed the assessment and treatment plan with the patient.  The patient was provided an opportunity to ask questions and all were answered.  The patient agreed with the plan and demonstrated an understanding of the instructions.  The patient was advised to call back if the symptoms worsen or if the condition fails to improve as anticipated.   Lequita Asal, MD, PhD    04/08/2019, 9:10 AM  I, Jacqualyn Posey, am acting as Education administrator for [employer]. Mike Gip, MD, PhD.  I,  C. Mike Gip, MD, have reviewed the above documentation for accuracy and completeness, and I agree with the above.

## 2019-04-08 ENCOUNTER — Other Ambulatory Visit: Payer: Self-pay

## 2019-04-08 ENCOUNTER — Encounter: Payer: Self-pay | Admitting: Hematology and Oncology

## 2019-04-08 ENCOUNTER — Other Ambulatory Visit: Payer: Self-pay | Admitting: Hematology and Oncology

## 2019-04-08 ENCOUNTER — Inpatient Hospital Stay: Payer: PPO | Admitting: Hematology and Oncology

## 2019-04-08 ENCOUNTER — Inpatient Hospital Stay: Payer: PPO

## 2019-04-08 ENCOUNTER — Telehealth: Payer: Self-pay

## 2019-04-08 VITALS — BP 156/54 | HR 74 | Temp 97.8°F | Resp 18 | Wt 121.0 lb

## 2019-04-08 DIAGNOSIS — M545 Low back pain, unspecified: Secondary | ICD-10-CM

## 2019-04-08 DIAGNOSIS — C9110 Chronic lymphocytic leukemia of B-cell type not having achieved remission: Secondary | ICD-10-CM | POA: Diagnosis not present

## 2019-04-08 DIAGNOSIS — D649 Anemia, unspecified: Secondary | ICD-10-CM

## 2019-04-08 DIAGNOSIS — E538 Deficiency of other specified B group vitamins: Secondary | ICD-10-CM | POA: Diagnosis not present

## 2019-04-08 LAB — CBC WITH DIFFERENTIAL/PLATELET
Abs Immature Granulocytes: 0.26 10*3/uL — ABNORMAL HIGH (ref 0.00–0.07)
Basophils Absolute: 0 10*3/uL (ref 0.0–0.1)
Basophils Relative: 0 %
Eosinophils Absolute: 0 10*3/uL (ref 0.0–0.5)
Eosinophils Relative: 0 %
HCT: 25.4 % — ABNORMAL LOW (ref 36.0–46.0)
Hemoglobin: 5.7 g/dL — ABNORMAL LOW (ref 12.0–15.0)
Immature Granulocytes: 0 %
Lymphocytes Relative: 98 %
Lymphs Abs: 310.8 10*3/uL — ABNORMAL HIGH (ref 0.7–4.0)
MCH: 25.9 pg — ABNORMAL LOW (ref 26.0–34.0)
MCHC: 22.4 g/dL — ABNORMAL LOW (ref 30.0–36.0)
MCV: 115.5 fL — ABNORMAL HIGH (ref 80.0–100.0)
Monocytes Absolute: 3.4 10*3/uL — ABNORMAL HIGH (ref 0.1–1.0)
Monocytes Relative: 1 %
Neutro Abs: 2.5 10*3/uL (ref 1.7–7.7)
Neutrophils Relative %: 1 %
Platelets: 85 10*3/uL — ABNORMAL LOW (ref 150–400)
RBC: 2.2 MIL/uL — ABNORMAL LOW (ref 3.87–5.11)
WBC: 317 10*3/uL (ref 4.0–10.5)
nRBC: 0 % (ref 0.0–0.2)

## 2019-04-08 LAB — BASIC METABOLIC PANEL
Anion gap: 10 (ref 5–15)
BUN: 17 mg/dL (ref 8–23)
CO2: 23 mmol/L (ref 22–32)
Calcium: 8.7 mg/dL — ABNORMAL LOW (ref 8.9–10.3)
Chloride: 106 mmol/L (ref 98–111)
Creatinine, Ser: 0.62 mg/dL (ref 0.44–1.00)
GFR calc Af Amer: >60 mL/min (ref >60)
GFR calc non Af Amer: >60 mL/min (ref >60)
Glucose, Bld: 103 mg/dL — ABNORMAL HIGH (ref 70–99)
Potassium: 3.9 mmol/L (ref 3.5–5.1)
Sodium: 139 mmol/L (ref 135–145)

## 2019-04-08 LAB — URINALYSIS, COMPLETE (UACMP) WITH MICROSCOPIC
Bacteria, UA: NONE SEEN
Bilirubin Urine: NEGATIVE
Glucose, UA: NEGATIVE mg/dL
Ketones, ur: NEGATIVE mg/dL
Nitrite: NEGATIVE
Specific Gravity, Urine: 1.01 (ref 1.005–1.030)
pH: 6 (ref 5.0–8.0)

## 2019-04-08 LAB — SAMPLE TO BLOOD BANK

## 2019-04-08 LAB — LACTATE DEHYDROGENASE: LDH: 137 U/L (ref 98–192)

## 2019-04-08 LAB — URIC ACID: Uric Acid, Serum: 4.5 mg/dL (ref 2.5–7.1)

## 2019-04-08 NOTE — Progress Notes (Signed)
Patient here for follow up. Patient states she is having urinary symptoms. Reports she had a "rediish" discoloration to urine x 1 episode this AM and some mild pain to left lower back. Reports she urinated afterwards and had no discoloration. Denies any dysuria or fever.

## 2019-04-08 NOTE — Telephone Encounter (Signed)
Informed patient of low HGB level and, per Dr. Mike Gip, it is recommended that she get 1 Unit PRBC tomorrow. Patient is agreeable and denies any further questions. Patient transferred to [facility] to schedule for transfusion.

## 2019-04-08 NOTE — Progress Notes (Unsigned)
Today's lab results:  WBC 317, Hgb 5.7, and platelets 85.  MD team made aware.

## 2019-04-09 ENCOUNTER — Other Ambulatory Visit: Payer: Self-pay | Admitting: Hematology and Oncology

## 2019-04-09 ENCOUNTER — Other Ambulatory Visit: Payer: Self-pay

## 2019-04-09 ENCOUNTER — Inpatient Hospital Stay: Payer: PPO

## 2019-04-09 DIAGNOSIS — C9110 Chronic lymphocytic leukemia of B-cell type not having achieved remission: Secondary | ICD-10-CM | POA: Diagnosis not present

## 2019-04-09 DIAGNOSIS — D649 Anemia, unspecified: Secondary | ICD-10-CM

## 2019-04-09 LAB — PREPARE RBC (CROSSMATCH)

## 2019-04-09 MED ORDER — SODIUM CHLORIDE 0.9% IV SOLUTION
250.0000 mL | Freq: Once | INTRAVENOUS | Status: AC
  Administered 2019-04-09: 09:00:00 250 mL via INTRAVENOUS
  Filled 2019-04-09: qty 250

## 2019-04-09 MED ORDER — DIPHENHYDRAMINE HCL 25 MG PO CAPS
25.0000 mg | ORAL_CAPSULE | Freq: Once | ORAL | Status: AC
  Administered 2019-04-09: 25 mg via ORAL
  Filled 2019-04-09: qty 1

## 2019-04-09 MED ORDER — ACETAMINOPHEN 325 MG PO TABS
650.0000 mg | ORAL_TABLET | Freq: Once | ORAL | Status: AC
  Administered 2019-04-09: 09:00:00 650 mg via ORAL
  Filled 2019-04-09: qty 2

## 2019-04-09 NOTE — Patient Instructions (Signed)
Blood Transfusion, Adult, Care After This sheet gives you information about how to care for yourself after your procedure. Your doctor may also give you more specific instructions. If you have problems or questions, contact your doctor. Follow these instructions at home:   Take over-the-counter and prescription medicines only as told by your doctor.  Go back to your normal activities as told by your doctor.  Follow instructions from your doctor about how to take care of the area where an IV tube was put into your vein (insertion site). Make sure you: ? Wash your hands with soap and water before you change your bandage (dressing). If there is no soap and water, use hand sanitizer. ? Change your bandage as told by your doctor.  Check your IV insertion site every day for signs of infection. Check for: ? More redness, swelling, or pain. ? More fluid or blood. ? Warmth. ? Pus or a bad smell. Contact a doctor if:  You have more redness, swelling, or pain around the IV insertion site.  You have more fluid or blood coming from the IV insertion site.  Your IV insertion site feels warm to the touch.  You have pus or a bad smell coming from the IV insertion site.  Your pee (urine) turns pink, red, or brown.  You feel weak after doing your normal activities. Get help right away if:  You have signs of a serious allergic or body defense (immune) system reaction, including: ? Itchiness. ? Hives. ? Trouble breathing. ? Anxiety. ? Pain in your chest or lower back. ? Fever, flushing, and chills. ? Fast pulse. ? Rash. ? Watery poop (diarrhea). ? Throwing up (vomiting). ? Dark pee. ? Serious headache. ? Dizziness. ? Stiff neck. ? Yellow color in your face or the white parts of your eyes (jaundice). Summary  After a blood transfusion, return to your normal activities as told by your doctor.  Every day, check for signs of infection where the IV tube was put into your vein.  Some  signs of infection are warm skin, more redness and pain, more fluid or blood, and pus or a bad smell where the needle went in.  Contact your doctor if you feel weak or have any unusual symptoms. This information is not intended to replace advice given to you by your health care provider. Make sure you discuss any questions you have with your health care provider. Document Released: 07/23/2014 Document Revised: 11/06/2017 Document Reviewed: 02/24/2016 Elsevier Patient Education  2020 [employer] Inc[employer]  

## 2019-04-10 ENCOUNTER — Inpatient Hospital Stay: Payer: PPO

## 2019-04-10 ENCOUNTER — Other Ambulatory Visit: Payer: Self-pay

## 2019-04-10 DIAGNOSIS — D649 Anemia, unspecified: Secondary | ICD-10-CM

## 2019-04-10 DIAGNOSIS — C9110 Chronic lymphocytic leukemia of B-cell type not having achieved remission: Secondary | ICD-10-CM | POA: Diagnosis not present

## 2019-04-10 LAB — URINE CULTURE: Culture: <10000 — AB

## 2019-04-10 MED ORDER — DIPHENHYDRAMINE HCL 25 MG PO CAPS
25.0000 mg | ORAL_CAPSULE | Freq: Once | ORAL | Status: AC
  Administered 2019-04-10: 25 mg via ORAL
  Filled 2019-04-10: qty 1

## 2019-04-10 MED ORDER — ACETAMINOPHEN 325 MG PO TABS
650.0000 mg | ORAL_TABLET | Freq: Once | ORAL | Status: AC
  Administered 2019-04-10: 650 mg via ORAL
  Filled 2019-04-10: qty 2

## 2019-04-10 MED ORDER — SODIUM CHLORIDE 0.9% IV SOLUTION
250.0000 mL | Freq: Once | INTRAVENOUS | Status: AC
  Administered 2019-04-10: 10:00:00 250 mL via INTRAVENOUS
  Filled 2019-04-10: qty 250

## 2019-04-10 NOTE — Progress Notes (Signed)
UNMATCHED BLOOD PRODUCT NOTE  Compare the patient ID on the blood tag to the patient ID on the hospital armband and Blood Bank armband. Then confirm the unit number on the blood tag matches the unit number on the blood product.  If a discrepancy is discovered return the product to blood bank immediately.   Blood Product Type: Packed Red Blood Cells  Unit #: (Found on blood product bag, begins with W) [id number]  Product Code #: (Found on blood product bag, begins with E) [id number]   Start Time: 1030  Starting Rate: 120 ml/hr  Rate increase/decreased  (if applicable):    917 ml/hr  Rate changed time (if applicable): 9217   Stop Time: 1215   All Other Documentation should be documented within the Blood Admin Flowsheet per policy.

## 2019-04-11 LAB — BPAM RBC
Blood Product Expiration Date: 202009302359
Blood Product Expiration Date: 202010122359
ISSUE DATE / TIME: 202009240948
ISSUE DATE / TIME: 202009251019
Unit Type and Rh: 5100
Unit Type and Rh: 5100

## 2019-04-11 LAB — TYPE AND SCREEN
ABO/RH(D): O POS
Antibody Screen: NEGATIVE
Unit division: 0
Unit division: 0

## 2019-04-14 ENCOUNTER — Other Ambulatory Visit: Payer: Self-pay

## 2019-04-15 ENCOUNTER — Inpatient Hospital Stay: Payer: PPO

## 2019-04-15 ENCOUNTER — Other Ambulatory Visit: Payer: Self-pay

## 2019-04-15 ENCOUNTER — Other Ambulatory Visit: Payer: Self-pay | Admitting: Hematology and Oncology

## 2019-04-15 DIAGNOSIS — D649 Anemia, unspecified: Secondary | ICD-10-CM

## 2019-04-15 DIAGNOSIS — E538 Deficiency of other specified B group vitamins: Secondary | ICD-10-CM

## 2019-04-15 DIAGNOSIS — C9110 Chronic lymphocytic leukemia of B-cell type not having achieved remission: Secondary | ICD-10-CM

## 2019-04-15 LAB — CBC WITH DIFFERENTIAL/PLATELET
Abs Immature Granulocytes: 0.22 10*3/uL — ABNORMAL HIGH (ref 0.00–0.07)
Basophils Absolute: 0 10*3/uL (ref 0.0–0.1)
Basophils Relative: 0 %
Eosinophils Absolute: 0 10*3/uL (ref 0.0–0.5)
Eosinophils Relative: 0 %
HCT: 26.1 % — ABNORMAL LOW (ref 36.0–46.0)
Hemoglobin: 7.5 g/dL — ABNORMAL LOW (ref 12.0–15.0)
Immature Granulocytes: 0 %
Lymphocytes Relative: 98 %
Lymphs Abs: 295.2 10*3/uL — ABNORMAL HIGH (ref 0.7–4.0)
MCH: 29.3 pg (ref 26.0–34.0)
MCHC: 28.7 g/dL — ABNORMAL LOW (ref 30.0–36.0)
MCV: 110.8 fL — ABNORMAL HIGH (ref 80.0–100.0)
Monocytes Absolute: 1.7 10*3/uL — ABNORMAL HIGH (ref 0.1–1.0)
Monocytes Relative: 1 %
Neutro Abs: 2.5 10*3/uL (ref 1.7–7.7)
Neutrophils Relative %: 1 %
Platelets: 76 10*3/uL — ABNORMAL LOW (ref 150–400)
RBC: 2.56 MIL/uL — ABNORMAL LOW (ref 3.87–5.11)
WBC: 299.6 10*3/uL (ref 4.0–10.5)
nRBC: 0 % (ref 0.0–0.2)

## 2019-04-15 LAB — BASIC METABOLIC PANEL
Anion gap: 6 (ref 5–15)
BUN: 19 mg/dL (ref 8–23)
CO2: 29 mmol/L (ref 22–32)
Calcium: 8.7 mg/dL — ABNORMAL LOW (ref 8.9–10.3)
Chloride: 107 mmol/L (ref 98–111)
Creatinine, Ser: 0.62 mg/dL (ref 0.44–1.00)
GFR calc Af Amer: >60 mL/min (ref >60)
GFR calc non Af Amer: >60 mL/min (ref >60)
Glucose, Bld: 92 mg/dL (ref 70–99)
Potassium: 3.7 mmol/L (ref 3.5–5.1)
Sodium: 142 mmol/L (ref 135–145)

## 2019-04-15 LAB — LACTATE DEHYDROGENASE: LDH: 152 U/L (ref 98–192)

## 2019-04-15 LAB — PREPARE RBC (CROSSMATCH)

## 2019-04-15 LAB — URIC ACID: Uric Acid, Serum: 4 mg/dL (ref 2.5–7.1)

## 2019-04-16 ENCOUNTER — Inpatient Hospital Stay: Payer: PPO | Attending: Hematology and Oncology

## 2019-04-16 ENCOUNTER — Other Ambulatory Visit: Payer: Self-pay

## 2019-04-16 DIAGNOSIS — F17210 Nicotine dependence, cigarettes, uncomplicated: Secondary | ICD-10-CM | POA: Insufficient documentation

## 2019-04-16 DIAGNOSIS — E538 Deficiency of other specified B group vitamins: Secondary | ICD-10-CM | POA: Diagnosis not present

## 2019-04-16 DIAGNOSIS — C9110 Chronic lymphocytic leukemia of B-cell type not having achieved remission: Secondary | ICD-10-CM | POA: Diagnosis not present

## 2019-04-16 DIAGNOSIS — Z79899 Other long term (current) drug therapy: Secondary | ICD-10-CM | POA: Insufficient documentation

## 2019-04-16 DIAGNOSIS — I714 Abdominal aortic aneurysm, without rupture: Secondary | ICD-10-CM | POA: Insufficient documentation

## 2019-04-16 DIAGNOSIS — R21 Rash and other nonspecific skin eruption: Secondary | ICD-10-CM | POA: Insufficient documentation

## 2019-04-16 DIAGNOSIS — R5383 Other fatigue: Secondary | ICD-10-CM | POA: Insufficient documentation

## 2019-04-16 DIAGNOSIS — R590 Localized enlarged lymph nodes: Secondary | ICD-10-CM | POA: Diagnosis not present

## 2019-04-16 DIAGNOSIS — D649 Anemia, unspecified: Secondary | ICD-10-CM | POA: Insufficient documentation

## 2019-04-16 DIAGNOSIS — L989 Disorder of the skin and subcutaneous tissue, unspecified: Secondary | ICD-10-CM | POA: Insufficient documentation

## 2019-04-16 LAB — SAMPLE TO BLOOD BANK

## 2019-04-16 MED ORDER — ACETAMINOPHEN 325 MG PO TABS
650.0000 mg | ORAL_TABLET | Freq: Once | ORAL | Status: AC
  Administered 2019-04-16: 650 mg via ORAL
  Filled 2019-04-16: qty 2

## 2019-04-16 MED ORDER — SODIUM CHLORIDE 0.9% IV SOLUTION
250.0000 mL | Freq: Once | INTRAVENOUS | Status: AC
  Administered 2019-04-16: 250 mL via INTRAVENOUS
  Filled 2019-04-16: qty 250

## 2019-04-16 MED ORDER — DIPHENHYDRAMINE HCL 25 MG PO CAPS
25.0000 mg | ORAL_CAPSULE | Freq: Once | ORAL | Status: AC
  Administered 2019-04-16: 09:00:00 25 mg via ORAL
  Filled 2019-04-16: qty 1

## 2019-04-16 NOTE — Patient Instructions (Signed)
Blood Transfusion, Adult, Care After This sheet gives you information about how to care for yourself after your procedure. Your doctor may also give you more specific instructions. If you have problems or questions, contact your doctor. Follow these instructions at home:   Take over-the-counter and prescription medicines only as told by your doctor.  Go back to your normal activities as told by your doctor.  Follow instructions from your doctor about how to take care of the area where an IV tube was put into your vein (insertion site). Make sure you: ? Wash your hands with soap and water before you change your bandage (dressing). If there is no soap and water, use hand sanitizer. ? Change your bandage as told by your doctor.  Check your IV insertion site every day for signs of infection. Check for: ? More redness, swelling, or pain. ? More fluid or blood. ? Warmth. ? Pus or a bad smell. Contact a doctor if:  You have more redness, swelling, or pain around the IV insertion site.  You have more fluid or blood coming from the IV insertion site.  Your IV insertion site feels warm to the touch.  You have pus or a bad smell coming from the IV insertion site.  Your pee (urine) turns pink, red, or brown.  You feel weak after doing your normal activities. Get help right away if:  You have signs of a serious allergic or body defense (immune) system reaction, including: ? Itchiness. ? Hives. ? Trouble breathing. ? Anxiety. ? Pain in your chest or lower back. ? Fever, flushing, and chills. ? Fast pulse. ? Rash. ? Watery poop (diarrhea). ? Throwing up (vomiting). ? Dark pee. ? Serious headache. ? Dizziness. ? Stiff neck. ? Yellow color in your face or the white parts of your eyes (jaundice). Summary  After a blood transfusion, return to your normal activities as told by your doctor.  Every day, check for signs of infection where the IV tube was put into your vein.  Some  signs of infection are warm skin, more redness and pain, more fluid or blood, and pus or a bad smell where the needle went in.  Contact your doctor if you feel weak or have any unusual symptoms. This information is not intended to replace advice given to you by your health care provider. Make sure you discuss any questions you have with your health care provider. Document Released: 07/23/2014 Document Revised: 11/06/2017 Document Reviewed: 02/24/2016 Elsevier Patient Education  2020 [employer] Inc[employer]  

## 2019-04-17 LAB — BPAM RBC
Blood Product Expiration Date: 202010232359
ISSUE DATE / TIME: 202010010933
Unit Type and Rh: 5100

## 2019-04-17 LAB — TYPE AND SCREEN
ABO/RH(D): O POS
Antibody Screen: NEGATIVE
Unit division: 0

## 2019-04-20 NOTE — Progress Notes (Signed)
[facility]  [address], [address] [place], [place] [address] Phone: [phone]  Fax: [phone]   Clinic Day:  04/22/2019  Referring physician: Juline Patch, MD  Chief Complaint: Maureen [name] is an 78 y.o. 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   HPI: The patient was last seen in the medical oncology clinic on 04/08/2019. At that time, she felt fatigued.  She described 1 episode of reddish urine.  Exam revealed decreasing adenopathy.  Urinalysis revealed 0-5 RBCs.  She received 1 unit of RBCs on 04/09/2019, 04/10/2019 & 04/16/2019  CBC has been followed: 04/08/2019:  Hematocrit 25.4, hemoglobin 5.7, MCV 115.5, platelets 85,000, WBC 317,000 (ANC 2500; ALC 310,800). 04/15/2019:  Hematocrit 26.1, hemoglobin 7.5, MCV 110.8, platelets 76,000, WBC 299,600 (ANC 2500; ALC 295,200).  04/21/2019:  Hematocrit 32.3, hemoglobin 7.6, MCV 109.9, platelets 75,000, WBC 340,300, {ANC 3900; ALC 333,600) LDH 169.  During the interim, she has been well, she is still taking brutunib. She has not had any new episodes of hematuria. She has no fever or SOB.  Symptomatically, she is feeling well today.  She notes she has much more energy. She was able to cut her grass yesterday. She remains well hydrated when doing outdoor activities. She can no longer feel any groin bumps or feel any lymph nodes in her armpits.    Past Medical History:  Diagnosis Date   Anemia    CLL (chronic lymphocytic leukemia) ([place]) 2002   CLL (chronic lymphocytic leukemia) ([place])    Itching    Vitamin B 12 deficiency     Past Surgical History:  Procedure Laterality Date   breeast mass removed  1988   benign    Family History  Family history unknown: Yes    Social History:  reports that she has been smoking cigarettes. She has a 28.00 pack-year smoking history. She has never used smokeless tobacco. She reports current alcohol use of about  1.0 standard drinks of alcohol per week. She reports that she does not use drugs. She is from [place]. 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 Her husband would often do tours of duty in [place].  She lives in [place].  The patient is alone  today.  Allergies: No Known Allergies  Current Medications: Current Outpatient Medications  Medication Sig Dispense Refill   folic acid (FOLVITE) 093 MCG tablet Take 800 mcg by mouth 3 (three) times a week.      Ibuprofen 200 MG CAPS Take 1 capsule by mouth as needed (PAIN).     vitamin B-12 (CYANOCOBALAMIN) 1000 MCG tablet Take 1,000 mcg by mouth daily.     No current facility-administered medications for this visit.     Review of Systems  Constitutional: Negative.  Negative for chills, diaphoresis, fever, malaise/fatigue and weight loss (stable).       Feels "better".  She has more energy.  HENT: Negative.  Negative for congestion, ear pain, nosebleeds, sinus pain and sore throat.   Eyes: Negative.  Negative for blurred vision, double vision and photophobia.  Respiratory: Negative.  Negative for cough, hemoptysis, sputum production and shortness of breath.   Cardiovascular: Negative.  Negative for chest pain, palpitations, orthopnea and leg swelling.  Gastrointestinal: Negative.  Negative for abdominal pain, blood in stool, constipation, diarrhea, melena, nausea and vomiting.  Genitourinary: Negative.  Negative for dysuria, flank pain, frequency, hematuria and urgency.  Musculoskeletal: Negative.  Negative for  back pain, falls, joint pain, myalgias and neck pain.  Skin: Negative.  Negative for rash.  Neurological: Negative.  Negative for dizziness, tingling, sensory change, speech change, focal weakness, weakness and headaches.  Endo/Heme/Allergies: Negative.  Does not bruise/bleed easily.  Psychiatric/Behavioral: Negative.  Negative for depression and memory loss. The patient is not nervous/anxious and does not have insomnia.     Performance status (ECOG): 1 - Symptomatic but completely ambulatory  Vitals Blood pressure (!) 144/59, pulse 64, temperature (!) 97.1 F (36.2 C), temperature source Tympanic, resp. rate 16, weight 121 lb 5.8 oz (55 kg), SpO2 99 %.   Physical Exam  Constitutional: She is oriented to person, place, and time. She appears well-developed.  Thin woman sitting comfortably in the exam room in no acute distress.  HENT:  Head: Normocephalic and atraumatic.  Right Ear: Tympanic membrane, external ear and ear canal normal.  Left Ear: Tympanic membrane, external ear and ear canal normal.  Nose: Nose normal. No rhinorrhea.  Mouth/Throat: Oropharynx is clear and moist. No oropharyngeal exudate.  Wearing a scarf and mask.  Eyes: Pupils are equal, round, and reactive to light. Conjunctivae and EOM are normal. Right eye exhibits no discharge. Left eye exhibits no discharge. No scleral icterus.  Blue eyes.   Neck: Normal range of motion. Neck supple. No JVD present.  Cardiovascular: Normal rate, regular rhythm and normal heart sounds. Exam reveals no gallop and no friction rub.  No murmur heard. Pulmonary/Chest: Effort normal and breath sounds normal. No respiratory distress. She has no wheezes. She has no rales.  Abdominal: Soft. Bowel sounds are normal. She exhibits no distension and no mass. There is no abdominal tenderness. There is no rebound and no guarding.  Musculoskeletal: Normal range of motion.        General: No tenderness or edema.  Lymphadenopathy:       Head (right side): No preauricular, no posterior auricular and no occipital adenopathy present.       Head (left side): No preauricular, no posterior auricular and no occipital adenopathy present.    She has no cervical adenopathy.    She has axillary adenopathy (decreasing).       Right: Supraclavicular (5 mm) adenopathy present.       Left: No supraclavicular adenopathy present.  Small 1 cm axillary nodes.  Shotty inguinal  adenopathy.  Neurological: She is alert and oriented to person, place, and time. She has normal reflexes.  Skin: Skin is warm and dry. No petechiae, no purpura and no rash (small eschars on lower extremities) noted. She is not diaphoretic. No pallor.  Psychiatric: She has a normal mood and affect. Her behavior is normal. Judgment and thought content normal.  Nursing note and vitals reviewed.   Appointment on 04/21/2019  Component Date Value Ref Range Status   Blood Bank Specimen 04/21/2019 SAMPLE AVAILABLE FOR TESTING   Final   Sample Expiration 04/21/2019    Final                   Value:04/24/2019,2359 Performed at [facility], [place], [place] [address]    Sodium 04/21/2019 139  135 - 145 mmol/L Final   Potassium 04/21/2019 3.7  3.5 - 5.1 mmol/L Final   Chloride 04/21/2019 106  98 - 111 mmol/L Final   CO2 04/21/2019 24  22 - 32 mmol/L Final   Glucose, Bld 04/21/2019 132* 70 - 99 mg/dL Final   BUN 04/21/2019 19  8 - 23 mg/dL Final  Creatinine, Ser 04/21/2019 0.65  0.44 - 1.00 mg/dL Final   Calcium 04/21/2019 8.3* 8.9 - 10.3 mg/dL Final   GFR calc non Af Amer 04/21/2019 >60  >60 mL/min Final   GFR calc Af Amer 04/21/2019 >60  >60 mL/min Final   Anion gap 04/21/2019 9  5 - 15 Final   Performed at [facility], [address], [place], [place] [address]   WBC 04/21/2019 340.3* 4.0 - 10.5 K/uL Final   This critical result has verified and been called to [place] by Memory Argue on 10 06 2020 at 1527, and has been read back. verified by smear   RBC 04/21/2019 2.94* 3.87 - 5.11 MIL/uL Final   Hemoglobin 04/21/2019 7.6* 12.0 - 15.0 g/dL Final   HCT 04/21/2019 32.3* 36.0 - 46.0 % Final   MCV 04/21/2019 109.9* 80.0 - 100.0 fL Final   MCH 04/21/2019 25.9* 26.0 - 34.0 pg Final   MCHC 04/21/2019 23.5* 30.0 - 36.0 g/dL Final   RDW 04/21/2019 Not Measured  11.5 - 15.5 % Final   Platelets 04/21/2019 75* 150 - 400 K/uL Final    Comment: Immature Platelet Fraction may be clinically indicated, consider ordering this additional test [id number]    nRBC 04/21/2019 0.0  0.0 - 0.2 % Final   Neutrophils Relative % 04/21/2019 1  % Final   Neutro Abs 04/21/2019 3.9  1.7 - 7.7 K/uL Final   Lymphocytes Relative 04/21/2019 98  % Final   Lymphs Abs 04/21/2019 333.6* 0.7 - 4.0 K/uL Final   Monocytes Relative 04/21/2019 1  % Final   Monocytes Absolute 04/21/2019 2.5* 0.1 - 1.0 K/uL Final   Eosinophils Relative 04/21/2019 0  % Final   Eosinophils Absolute 04/21/2019 0.0  0.0 - 0.5 K/uL Final   Basophils Relative 04/21/2019 0  % Final   Basophils Absolute 04/21/2019 0.0  0.0 - 0.1 K/uL Final   Immature Granulocytes 04/21/2019 0  % Final   Abs Immature Granulocytes 04/21/2019 0.41* 0.00 - 0.07 K/uL Final   Performed at [facility], [address], [place], [place] [address]   Uric Acid, Serum 04/21/2019 4.0  2.5 - 7.1 mg/dL Final   Performed at [facility], [address] [place], [place] [address]   LDH 04/21/2019 169  98 - 192 U/L Final   Performed at [facility], [address] [place], [place] [address]    Assessment:  Maureen [name] is a 78 y.o. female stage IV CLL. She was diagnosed in [place] in 2002. Bone marrowon 03/2008 revealed 90% cellularity with diffuse involvement by CLL. Karyotype was normal. FISHstudies on 08/19/2018 revealed loss of one ATM, homologous deletion of 13q, and normal CCDN1/IGH, chromosome 12, and TP53.  She received FCRx 4 cycles (last 08/06/2008). She had a partial remission. She received single agent Rituxanx 7. Treatment was held secondary to neutropenia.  She received 6 cycles of Gazyva(07/26/2016 - 01/23/2017).  Chest, abdomen, and pelvisCTon 10/17/2017 revealed significant partial treatment response. There was mild axillary, mediastinal, hilar, retroperitoneal, mesenteric and bilateral pelvic adenopathy, all  significantly decreased in the interval. There was mild splenomegaly (13 cm), significantly decreased. There was a new 3.4 cm masslike focus of hypoenhancement in the posterior upper right kidney, indeterminate. There was a stable 3.1 cm infrarenal abdominal aortic aneurysm  Abdomen and pelvisCTon 02/13/2018 revealed patchy enhancement of the right kidney concerning for pyelonephritis. Additionally there was urothelial thickening and hyperenhancement involving the right renal collecting system and  ureter compatible with ascending urinary tract infection. There was similar-appearing abdominal and retroperitoneal adenopathy.  She beganibrutinibon 03/25/2019.  She has a history of a skin rashs/p anterior thigh biopsy in 2012. 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 She was treated with a course of doxycycline, topical Clobetasol, and hydroxyzine for itching. She states that she gets the rash on her legs in the summertime.  She was diagnosed with B12 deficiencyin 2012. Shere-started oral B12on 09/24/2018. B12 was 237 on 03/11/202 and570 on 10/21/2018.  Symptomatically, she is doing well.  Energy level has improved.  Adenopathy is decreasing.  Plan: 1.   Labs today:  CBC with diff, BMP, LDH, uric acid, hold tube. 2. Chronic lymphocytic leukemia (CLL) Clinically, she continues to do well.             Exam reveals continued decreasing adenopathy. Weight is stable.  She denies any fevers or sweats. Ibrutinib began on 03/25/2019.  She is tolerating well.             WBC remains elevated at 340,300.             Lymphocytosis expected with initiation of therapy and typically resolves within 14 weeks.                         Monitor for leukostasis (uncommon).     Watch for WBC >= 400,000 as cases of intracranial hemorrhage, lethargy, headache, and gait  instability.   Discuss importance of maintaining hydration.             Continue to monitor closely. 3. Anemia Hematocrit 32.3.  Hemoglobin 7.6.  MCV 109.9. Etiology secondary to CLL.  Retic 0.7% on 03/25/2019. Coombs is negative.  Haptoglobin normal. No evidence of hemolysis. Patient on B12 and folate.TSH normal. Ferritin was244on 03/18/2019.             Patient has received 3 units PRBCs to date.  Discuss judicious transfusion if symptomatic given elevated WBC. 4.B12 deficiency B12 level was 570 on 10/21/2018. Folate was 36 on 09/24/2018. Continue oral B12 and folate. 5.Elevated iron saturation Iron saturation was 86%on 03/18/2019.             Continue to monitor. 6.Hyperkalemia, resolved Potassium3.7 on 04/21/2019. Continue to monitor. 7.Skin lesions Patient followed by dermatology. No active rash.  Continue to monitor. 8.   RTC weekly x 2 for labs (CBC with diff, hold tube). 9.   RN to call patient weekly with CBC. 10.   RTC in 3 weeks for MD assessment, labs (CBC with diff, BMP, hold tube- day before), and +/- PRBC transfusion next day.  I discussed the assessment and treatment plan with the patient.  The patient was provided an opportunity to ask questions and all were answered.  The patient agreed with the plan and demonstrated an understanding of the instructions.  The patient was advised to call back if the symptoms worsen or if the condition fails to improve as anticipated.   Lequita Asal, MD, PhD    04/22/2019, 4:19 PM  I, Samul Dada, am acting as a scribe for Lequita Asal, MD.  I, [place] Mike Gip, MD, have reviewed the above documentation for accuracy and completeness, and I agree with the above.

## 2019-04-21 ENCOUNTER — Inpatient Hospital Stay: Payer: PPO

## 2019-04-21 ENCOUNTER — Other Ambulatory Visit: Payer: Self-pay

## 2019-04-21 DIAGNOSIS — C9110 Chronic lymphocytic leukemia of B-cell type not having achieved remission: Secondary | ICD-10-CM

## 2019-04-21 DIAGNOSIS — N2889 Other specified disorders of kidney and ureter: Secondary | ICD-10-CM

## 2019-04-21 DIAGNOSIS — D649 Anemia, unspecified: Secondary | ICD-10-CM

## 2019-04-21 DIAGNOSIS — E538 Deficiency of other specified B group vitamins: Secondary | ICD-10-CM

## 2019-04-21 LAB — CBC WITH DIFFERENTIAL/PLATELET
Abs Immature Granulocytes: 0.41 10*3/uL — ABNORMAL HIGH (ref 0.00–0.07)
Basophils Absolute: 0 10*3/uL (ref 0.0–0.1)
Basophils Relative: 0 %
Eosinophils Absolute: 0 10*3/uL (ref 0.0–0.5)
Eosinophils Relative: 0 %
HCT: 32.3 % — ABNORMAL LOW (ref 36.0–46.0)
Hemoglobin: 7.6 g/dL — ABNORMAL LOW (ref 12.0–15.0)
Immature Granulocytes: 0 %
Lymphocytes Relative: 98 %
Lymphs Abs: 333.6 10*3/uL — ABNORMAL HIGH (ref 0.7–4.0)
MCH: 25.9 pg — ABNORMAL LOW (ref 26.0–34.0)
MCHC: 23.5 g/dL — ABNORMAL LOW (ref 30.0–36.0)
MCV: 109.9 fL — ABNORMAL HIGH (ref 80.0–100.0)
Monocytes Absolute: 2.5 10*3/uL — ABNORMAL HIGH (ref 0.1–1.0)
Monocytes Relative: 1 %
Neutro Abs: 3.9 10*3/uL (ref 1.7–7.7)
Neutrophils Relative %: 1 %
Platelets: 75 10*3/uL — ABNORMAL LOW (ref 150–400)
RBC: 2.94 MIL/uL — ABNORMAL LOW (ref 3.87–5.11)
WBC: 340.3 10*3/uL (ref 4.0–10.5)
nRBC: 0 % (ref 0.0–0.2)

## 2019-04-21 LAB — BASIC METABOLIC PANEL
Anion gap: 9 (ref 5–15)
BUN: 19 mg/dL (ref 8–23)
CO2: 24 mmol/L (ref 22–32)
Calcium: 8.3 mg/dL — ABNORMAL LOW (ref 8.9–10.3)
Chloride: 106 mmol/L (ref 98–111)
Creatinine, Ser: 0.65 mg/dL (ref 0.44–1.00)
GFR calc Af Amer: >60 mL/min (ref >60)
GFR calc non Af Amer: >60 mL/min (ref >60)
Glucose, Bld: 132 mg/dL — ABNORMAL HIGH (ref 70–99)
Potassium: 3.7 mmol/L (ref 3.5–5.1)
Sodium: 139 mmol/L (ref 135–145)

## 2019-04-21 LAB — LACTATE DEHYDROGENASE: LDH: 169 U/L (ref 98–192)

## 2019-04-21 LAB — URIC ACID: Uric Acid, Serum: 4 mg/dL (ref 2.5–7.1)

## 2019-04-21 LAB — SAMPLE TO BLOOD BANK

## 2019-04-22 ENCOUNTER — Inpatient Hospital Stay: Payer: PPO

## 2019-04-22 ENCOUNTER — Encounter: Payer: Self-pay | Admitting: Hematology and Oncology

## 2019-04-22 ENCOUNTER — Inpatient Hospital Stay (HOSPITAL_BASED_OUTPATIENT_CLINIC_OR_DEPARTMENT_OTHER): Payer: PPO | Admitting: Hematology and Oncology

## 2019-04-22 ENCOUNTER — Other Ambulatory Visit: Payer: PPO

## 2019-04-22 VITALS — BP 144/59 | HR 64 | Temp 97.1°F | Resp 16 | Wt 121.4 lb

## 2019-04-22 DIAGNOSIS — D649 Anemia, unspecified: Secondary | ICD-10-CM | POA: Diagnosis not present

## 2019-04-22 DIAGNOSIS — C9110 Chronic lymphocytic leukemia of B-cell type not having achieved remission: Secondary | ICD-10-CM

## 2019-04-22 DIAGNOSIS — E538 Deficiency of other specified B group vitamins: Secondary | ICD-10-CM

## 2019-04-22 NOTE — Progress Notes (Signed)
Patient here for follow up. Patient states she feeling much better. Denies any concerns.

## 2019-04-28 ENCOUNTER — Other Ambulatory Visit: Payer: Self-pay

## 2019-04-29 ENCOUNTER — Other Ambulatory Visit: Payer: Self-pay | Admitting: *Deleted

## 2019-04-29 ENCOUNTER — Inpatient Hospital Stay: Payer: PPO

## 2019-04-29 ENCOUNTER — Other Ambulatory Visit: Payer: Self-pay | Admitting: Oncology

## 2019-04-29 ENCOUNTER — Telehealth: Payer: Self-pay | Admitting: *Deleted

## 2019-04-29 DIAGNOSIS — E538 Deficiency of other specified B group vitamins: Secondary | ICD-10-CM

## 2019-04-29 DIAGNOSIS — D649 Anemia, unspecified: Secondary | ICD-10-CM

## 2019-04-29 DIAGNOSIS — C9110 Chronic lymphocytic leukemia of B-cell type not having achieved remission: Secondary | ICD-10-CM | POA: Diagnosis not present

## 2019-04-29 LAB — CBC WITH DIFFERENTIAL/PLATELET
Abs Immature Granulocytes: 0.42 10*3/uL — ABNORMAL HIGH (ref 0.00–0.07)
Basophils Absolute: 0 10*3/uL (ref 0.0–0.1)
Basophils Relative: 0 %
Eosinophils Absolute: 0 10*3/uL (ref 0.0–0.5)
Eosinophils Relative: 0 %
HCT: 31.6 % — ABNORMAL LOW (ref 36.0–46.0)
Hemoglobin: 6.8 g/dL — ABNORMAL LOW (ref 12.0–15.0)
Immature Granulocytes: 0 %
Lymphocytes Relative: 98 %
Lymphs Abs: 387 10*3/uL — ABNORMAL HIGH (ref 0.7–4.0)
MCH: 23.9 pg — ABNORMAL LOW (ref 26.0–34.0)
MCHC: 21.5 g/dL — ABNORMAL LOW (ref 30.0–36.0)
MCV: 111.3 fL — ABNORMAL HIGH (ref 80.0–100.0)
Monocytes Absolute: 2.4 10*3/uL — ABNORMAL HIGH (ref 0.1–1.0)
Monocytes Relative: 1 %
Neutro Abs: 3.5 10*3/uL (ref 1.7–7.7)
Neutrophils Relative %: 1 %
Platelets: 86 10*3/uL — ABNORMAL LOW (ref 150–400)
RBC: 2.84 MIL/uL — ABNORMAL LOW (ref 3.87–5.11)
WBC: 393.3 10*3/uL (ref 4.0–10.5)
nRBC: 0 % (ref 0.0–0.2)

## 2019-04-29 LAB — SAMPLE TO BLOOD BANK

## 2019-04-29 NOTE — Telephone Encounter (Signed)
Lab called to report WBC 393 and Hgb 6.8.  Information relayed to MD team.

## 2019-04-29 NOTE — Progress Notes (Signed)
Re: CLL  Patient currently on ibrutinib.  Lab work:  CBC    Component Value Date/Time   WBC 393.3 (HH) 04/29/2019 0912   RBC 2.84 (L) 04/29/2019 0912   HGB 6.8 (L) 04/29/2019 0912   HGB 14.1 10/21/2014 0914   HCT 31.6 (L) 04/29/2019 0912   HCT 43.8 10/21/2014 0914   PLT 86 (L) 04/29/2019 0912   PLT 175 08/08/2017 1052   MCV 111.3 (H) 04/29/2019 0912   MCV 93 10/21/2014 0914   MCH 23.9 (L) 04/29/2019 0912   MCHC 21.5 (L) 04/29/2019 0912   RDW Not Measured 04/29/2019 0912   RDW 14.2 10/21/2014 0914   LYMPHSABS 387.0 (H) 04/29/2019 0912   LYMPHSABS 21.1 (H) 10/21/2014 0914   MONOABS 2.4 (H) 04/29/2019 0912   MONOABS 0.7 10/21/2014 0914   EOSABS 0.0 04/29/2019 0912   EOSABS 0.1 10/21/2014 0914   BASOSABS 0.0 04/29/2019 0912   BASOSABS 0.1 10/21/2014 0914    Reviewed previous notes from Dr. Mike Gip. Transfuse judiciously if patient is symptomatic. Keep close eye on WBC: Lymphocytosis is expected with initiation of therapy and typically resolves within 14 weeks.  Monitor for leukostasis which is very uncommon.  Watch for a white count greater than 400,000 for symptoms of intracranial hemorrhage, lethargy, headache and gait instability. Patient does not have these symptoms today. Consulted with Dr. Janese Banks, who is in agreements to continue with plan of care and bring back patient on Friday for possible blood transfusion. Orders placed. No adjustments needed to ibrutinib at this time.  Faythe Casa, NP 04/29/2019 12:44 PM

## 2019-04-30 ENCOUNTER — Other Ambulatory Visit: Payer: Self-pay | Admitting: *Deleted

## 2019-04-30 LAB — PREPARE RBC (CROSSMATCH)

## 2019-05-01 ENCOUNTER — Other Ambulatory Visit: Payer: Self-pay

## 2019-05-01 ENCOUNTER — Inpatient Hospital Stay: Payer: PPO

## 2019-05-01 VITALS — BP 128/70 | HR 61 | Temp 97.0°F | Resp 20

## 2019-05-01 DIAGNOSIS — C9110 Chronic lymphocytic leukemia of B-cell type not having achieved remission: Secondary | ICD-10-CM | POA: Diagnosis not present

## 2019-05-01 DIAGNOSIS — Z95828 Presence of other vascular implants and grafts: Secondary | ICD-10-CM

## 2019-05-01 MED ORDER — SODIUM CHLORIDE 0.9% FLUSH
10.0000 mL | INTRAVENOUS | Status: DC | PRN
  Filled 2019-05-01: qty 10

## 2019-05-01 MED ORDER — DIPHENHYDRAMINE HCL 25 MG PO CAPS
25.0000 mg | ORAL_CAPSULE | Freq: Once | ORAL | Status: AC
  Administered 2019-05-01: 10:00:00 25 mg via ORAL

## 2019-05-01 MED ORDER — HEPARIN SOD (PORK) LOCK FLUSH 100 UNIT/ML IV SOLN: 500.0000 [IU] | Freq: Once | INTRAVENOUS | Status: DC

## 2019-05-01 MED ORDER — SODIUM CHLORIDE 0.9% IV SOLUTION
Freq: Once | INTRAVENOUS | Status: AC
  Administered 2019-05-01: 10:00:00 via INTRAVENOUS
  Filled 2019-05-01: qty 250

## 2019-05-01 MED ORDER — ACETAMINOPHEN 325 MG PO TABS
650.0000 mg | ORAL_TABLET | Freq: Once | ORAL | Status: AC
  Administered 2019-05-01: 10:00:00 650 mg via ORAL

## 2019-05-01 NOTE — Patient Instructions (Signed)
Blood Transfusion, Adult, Care After This sheet gives you information about how to care for yourself after your procedure. Your doctor may also give you more specific instructions. If you have problems or questions, contact your doctor. Follow these instructions at home:   Take over-the-counter and prescription medicines only as told by your doctor.  Go back to your normal activities as told by your doctor.  Follow instructions from your doctor about how to take care of the area where an IV tube was put into your vein (insertion site). Make sure you: ? Wash your hands with soap and water before you change your bandage (dressing). If there is no soap and water, use hand sanitizer. ? Change your bandage as told by your doctor.  Check your IV insertion site every day for signs of infection. Check for: ? More redness, swelling, or pain. ? More fluid or blood. ? Warmth. ? Pus or a bad smell. Contact a doctor if:  You have more redness, swelling, or pain around the IV insertion site.  You have more fluid or blood coming from the IV insertion site.  Your IV insertion site feels warm to the touch.  You have pus or a bad smell coming from the IV insertion site.  Your pee (urine) turns pink, red, or brown.  You feel weak after doing your normal activities. Get help right away if:  You have signs of a serious allergic or body defense (immune) system reaction, including: ? Itchiness. ? Hives. ? Trouble breathing. ? Anxiety. ? Pain in your chest or lower back. ? Fever, flushing, and chills. ? Fast pulse. ? Rash. ? Watery poop (diarrhea). ? Throwing up (vomiting). ? Dark pee. ? Serious headache. ? Dizziness. ? Stiff neck. ? Yellow color in your face or the white parts of your eyes (jaundice). Summary  After a blood transfusion, return to your normal activities as told by your doctor.  Every day, check for signs of infection where the IV tube was put into your vein.  Some  signs of infection are warm skin, more redness and pain, more fluid or blood, and pus or a bad smell where the needle went in.  Contact your doctor if you feel weak or have any unusual symptoms. This information is not intended to replace advice given to you by your health care provider. Make sure you discuss any questions you have with your health care provider. Document Released: 07/23/2014 Document Revised: 11/06/2017 Document Reviewed: 02/24/2016 Elsevier Patient Education  2020 [employer] Inc[employer]  

## 2019-05-02 LAB — TYPE AND SCREEN
ABO/RH(D): O POS
Antibody Screen: NEGATIVE
Unit division: 0

## 2019-05-02 LAB — BPAM RBC
Blood Product Expiration Date: 202010202359
ISSUE DATE / TIME: 202010161035
Unit Type and Rh: 9500

## 2019-05-06 ENCOUNTER — Other Ambulatory Visit: Payer: Self-pay

## 2019-05-06 ENCOUNTER — Telehealth: Payer: Self-pay

## 2019-05-06 ENCOUNTER — Inpatient Hospital Stay: Payer: PPO

## 2019-05-06 ENCOUNTER — Telehealth: Payer: Self-pay | Admitting: *Deleted

## 2019-05-06 DIAGNOSIS — C9110 Chronic lymphocytic leukemia of B-cell type not having achieved remission: Secondary | ICD-10-CM | POA: Diagnosis not present

## 2019-05-06 DIAGNOSIS — D649 Anemia, unspecified: Secondary | ICD-10-CM

## 2019-05-06 DIAGNOSIS — E538 Deficiency of other specified B group vitamins: Secondary | ICD-10-CM

## 2019-05-06 LAB — CBC WITH DIFFERENTIAL/PLATELET
Abs Immature Granulocytes: 0.34 10*3/uL — ABNORMAL HIGH (ref 0.00–0.07)
Basophils Absolute: 0 10*3/uL (ref 0.0–0.1)
Basophils Relative: 0 %
Eosinophils Absolute: 0 10*3/uL (ref 0.0–0.5)
Eosinophils Relative: 0 %
HCT: 25 % — ABNORMAL LOW (ref 36.0–46.0)
Hemoglobin: 7.1 g/dL — ABNORMAL LOW (ref 12.0–15.0)
Immature Granulocytes: 0 %
Lymphocytes Relative: 99 %
Lymphs Abs: 328.3 10*3/uL — ABNORMAL HIGH (ref 0.7–4.0)
MCH: 25.6 pg — ABNORMAL LOW (ref 26.0–34.0)
MCHC: 23.3 g/dL — ABNORMAL LOW (ref 30.0–36.0)
MCV: 107 fL — ABNORMAL HIGH (ref 80.0–100.0)
Monocytes Absolute: 1.4 10*3/uL — ABNORMAL HIGH (ref 0.1–1.0)
Monocytes Relative: 0 %
Neutro Abs: 3.3 10*3/uL (ref 1.7–7.7)
Neutrophils Relative %: 1 %
Platelets: 88 10*3/uL — ABNORMAL LOW (ref 150–400)
RBC: 2.52 MIL/uL — ABNORMAL LOW (ref 3.87–5.11)
WBC: 333.3 10*3/uL (ref 4.0–10.5)
nRBC: 0 % (ref 0.0–0.2)

## 2019-05-06 LAB — BASIC METABOLIC PANEL
Anion gap: 8 (ref 5–15)
BUN: 20 mg/dL (ref 8–23)
CO2: 26 mmol/L (ref 22–32)
Calcium: 8.5 mg/dL — ABNORMAL LOW (ref 8.9–10.3)
Chloride: 107 mmol/L (ref 98–111)
Creatinine, Ser: 0.61 mg/dL (ref 0.44–1.00)
GFR calc Af Amer: >60 mL/min (ref >60)
GFR calc non Af Amer: >60 mL/min (ref >60)
Glucose, Bld: 93 mg/dL (ref 70–99)
Potassium: 4.4 mmol/L (ref 3.5–5.1)
Sodium: 141 mmol/L (ref 135–145)

## 2019-05-06 LAB — SAMPLE TO BLOOD BANK

## 2019-05-06 NOTE — Telephone Encounter (Signed)
Informed patient of la results. Patient states she is feeling fine at this time and does not feel like she needs a transfusion at this time. Reminded of follow up appt. Next week and encouraged patient to contact office prior to appt. Should she feel that she needs the transfusion. Patient verbalizes understanding and denies any further questions or concerns.

## 2019-05-06 NOTE — Telephone Encounter (Signed)
Called received from [place], in the lab, that patient's WBC is 333.33, Hgb is 7.1, and platelets are 88 for today's lab draw.  MD made aware.

## 2019-05-10 NOTE — Progress Notes (Signed)
[facility]  [address] [place], [place] [address] Phone: [phone]  Fax: [phone]   Clinic Day:  05/13/2019  Referring physician: Juline Patch, MD  Chief Complaint: Maureen [name] is a 78 y.o. 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   HPI: The patient was last seen in the medical oncology clinic on 04/22/2019. At that time, she was doing well.  Energy level had improved.  Adenopathy was decreasing.  Hematocrit was 32.3, hemoglobin 7.6, MCV 109.9.  She was tolerating ibruntinib well.  She reported to the clinic on 05/06/2019 that she did not feel like she needed a transfusion.  Labs followed: 04/29/2019: Hematocrit 31.6, hemoglobin 6.8, MCV 111.3, platelets 86,000, WBC 393,300 (ANC 2,400).   05/06/2019: Hematocrit 25.0, hemoglobin 7.1, MCV 107.0, platelets 88,000, WBC 333,300 (ANC 1,400).  05/12/2019: hematocrit 19.7, hemoglobin 6.1, MCV 108.6, platelets 88,000, WBC 314,100 (ANC 1,100).  During the interim, she has continued to do well. Patient has been fatigued x 2 days. She denies any chest pain or shortness of breath.  She denies any bleeding.  She is active in her garden.    Past Medical History:  Diagnosis Date  . Anemia   . CLL (chronic lymphocytic leukemia) ([place]) 2002  . CLL (chronic lymphocytic leukemia) ([place])   . Itching   . Vitamin B 12 deficiency     Past Surgical History:  Procedure Laterality Date  . breeast mass removed  1988   benign    Family History  Family history unknown: Yes    Social History:  reports that she has been smoking cigarettes. She has a 28.00 pack-year smoking history. She has never used smokeless tobacco. She reports current alcohol use of about 1.0 standard drinks of alcohol per week. She reports that she does not use drugs. She is from [place]. 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 Her husband would often do tours of duty in  [place].  She lives in [place]. She has 2 dogs and 3 cats. The patient is alone today.  Allergies: No Known Allergies  Current Medications: Current Outpatient Medications  Medication Sig Dispense Refill  . folic acid (FOLVITE) 982 MCG tablet Take 800 mcg by mouth 3 (three) times a week.     . Ibuprofen 200 MG CAPS Take 1 capsule by mouth as needed (PAIN).    . IMBRUVICA 420 MG TABS Take 1 tablet by mouth daily.     . vitamin B-12 (CYANOCOBALAMIN) 1000 MCG tablet Take 1,000 mcg by mouth daily.     No current facility-administered medications for this visit.    Facility-Administered Medications Ordered in Other Visits  Medication Dose Route Frequency Provider Last Rate Last Dose  . 0.9 %  sodium chloride infusion (Manually program via Guardrails IV Fluids)  250 mL Intravenous Once Nolon Stalls C, MD      . acetaminophen (TYLENOL) tablet 650 mg  650 mg Oral Once Nolon Stalls C, MD      . diphenhydrAMINE (BENADRYL) capsule 25 mg  25 mg Oral Once Lequita Asal, MD        Review of Systems  Constitutional: Positive for malaise/fatigue. Negative for chills, diaphoresis, fever and weight loss (stable).       Doing well.  HENT: Negative.  Negative for congestion, nosebleeds, sinus pain and sore throat.   Eyes: Negative.  Negative for blurred vision, double vision and photophobia.  Respiratory:  Negative.  Negative for cough, hemoptysis, sputum production and shortness of breath.   Cardiovascular: Negative.  Negative for chest pain, palpitations, orthopnea and leg swelling.  Gastrointestinal: Negative.  Negative for abdominal pain, blood in stool, constipation, diarrhea, melena, nausea and vomiting.  Genitourinary: Negative.  Negative for dysuria, flank pain, frequency, hematuria and urgency.  Musculoskeletal: Negative.  Negative for back pain, falls, joint pain, myalgias and neck pain.  Skin: Negative.  Negative for rash.  Neurological: Negative.  Negative for dizziness,  tingling, sensory change, speech change, focal weakness, weakness and headaches.  Endo/Heme/Allergies: Negative.  Does not bruise/bleed easily.  Psychiatric/Behavioral: Negative.  Negative for depression and memory loss. The patient is not nervous/anxious and does not have insomnia.   All other systems reviewed and are negative.  Performance status (ECOG): 1  Vitals Blood pressure (!) 133/32, pulse 66, temperature 97.6 F (36.4 C), temperature source Tympanic, resp. rate 16, weight 121 lb 9.3 oz (55.2 kg), SpO2 100 %.  Physical Exam  Constitutional: She is oriented to person, place, and time.  Thin woman sitting comfortably in the exam room in no acute distress.  HENT:  Head: Normocephalic and atraumatic.  Right Ear: Tympanic membrane, external ear and ear [name] normal.  Left Ear: Tympanic membrane, external ear and ear [name] normal.  Nose: Nose normal. No rhinorrhea.  Mouth/Throat: Oropharynx is clear and moist. No oropharyngeal exudate.  Wearing a taupe cap and mask.  Eyes: Pupils are equal, round, and reactive to light. Conjunctivae and EOM are normal. No scleral icterus.  Blue eyes.   Neck: Normal range of motion. Neck supple. No JVD present.  Cardiovascular: Normal rate, regular rhythm and normal heart sounds. Exam reveals no gallop and no friction rub.  No murmur heard. Pulmonary/Chest: Effort normal and breath sounds normal. No respiratory distress. She has no wheezes. She has no rales.  Abdominal: Soft. Bowel sounds are normal. She exhibits no distension and no mass. There is no abdominal tenderness. There is no rebound and no guarding.  Musculoskeletal: Normal range of motion.        General: No tenderness or edema.  Lymphadenopathy:       Head (right side): No preauricular, no posterior auricular and no occipital adenopathy present.       Head (left side): No preauricular, no posterior auricular and no occipital adenopathy present.    She has no cervical adenopathy.    She  has no axillary adenopathy.       Right: No inguinal and no supraclavicular adenopathy present.       Left: No inguinal and no supraclavicular adenopathy present.  Neurological: She is alert and oriented to person, place, and time. She has normal reflexes.  Skin: Skin is warm and dry. No petechiae, no purpura and no rash noted. She is not diaphoretic. No erythema. No pallor.  Psychiatric: She has a normal mood and affect. Her behavior is normal. Judgment and thought content normal.  Nursing note and vitals reviewed.   Orders Only on 05/12/2019  Component Date Value Ref Range Status  . ABO/RH(D) 05/12/2019 O POS   Final  . Antibody Screen 05/12/2019 NEG   Final  . Sample Expiration 05/12/2019 05/15/2019,2359   Final  . Unit Number 05/12/2019 [id number]   Final  . Blood Component Type 05/12/2019 RBC, LR IRR   Final  . Unit division 05/12/2019 00   Final  . Status of Unit 05/12/2019 IN-TRANSIT   Final  . Transfusion Status 05/12/2019 OK TO TRANSFUSE  Final  . Crossmatch Result 05/12/2019    Final                   Value:Compatible Performed at [facility], [address] [place], [place] [address]   . Order Confirmation 05/12/2019    Final                   Value:ORDER PROCESSED BY BLOOD BANK Performed at [facility], [address] [place], [place] [address]   . Blood Product Unit Number 05/12/2019 [id number]   Final  . PRODUCT CODE 05/12/2019 [id number]   Final  . Unit Type and Rh 05/12/2019 9500   Final  . Blood Product Expiration Date 05/12/2019 [id number]   Final  Appointment on 05/12/2019  Component Date Value Ref Range Status  . Blood Bank Specimen 05/12/2019 SAMPLE AVAILABLE FOR TESTING   Final  . Sample Expiration 05/12/2019    Final                   Value:05/15/2019,2359 Performed at [facility], [address] [place], [place] [address]   . WBC 05/12/2019 314.1* 4.0 - 10.5 K/uL Final   This critical result has verified and  been called to [place] by Volney Presser on 10 27 2020 at 0948, and has been read back.   Marland Kitchen RBC 05/12/2019 2.01* 3.87 - 5.11 MIL/uL Final  . Hemoglobin 05/12/2019 6.1* 12.0 - 15.0 g/dL Final  . HCT 05/12/2019 19.7* 36.0 - 46.0 % Final  . MCV 05/12/2019 108.6* 80.0 - 100.0 fL Final  . MCH 05/12/2019 26.2  26.0 - 34.0 pg Final  . MCHC 05/12/2019 24.1* 30.0 - 36.0 g/dL Final  . RDW 05/12/2019 NCAL  11.5 - 15.5 % Final  . Platelets 05/12/2019 88* 150 - 400 K/uL Final   Comment: Immature Platelet Fraction may be clinically indicated, consider ordering this additional test [id number]   . nRBC 05/12/2019 0.0  0.0 - 0.2 % Final  . Neutrophils Relative % 05/12/2019 1  % Final  . Neutro Abs 05/12/2019 3.2  1.7 - 7.7 K/uL Final  . Lymphocytes Relative 05/12/2019 99  % Final  . Lymphs Abs 05/12/2019 309.4* 0.7 - 4.0 K/uL Final  . Monocytes Relative 05/12/2019 0  % Final  . Monocytes Absolute 05/12/2019 1.1* 0.1 - 1.0 K/uL Final  . Eosinophils Relative 05/12/2019 0  % Final  . Eosinophils Absolute 05/12/2019 0.0  0.0 - 0.5 K/uL Final  . Basophils Relative 05/12/2019 0  % Final  . Basophils Absolute 05/12/2019 0.0  0.0 - 0.1 K/uL Final  . Immature Granulocytes 05/12/2019 0  % Final  . Abs Immature Granulocytes 05/12/2019 0.34* 0.00 - 0.07 K/uL Final   Performed at [facility], [address], [place], [place] [address]  . Sodium 05/12/2019 140  135 - 145 mmol/L Final  . Potassium 05/12/2019 3.9  3.5 - 5.1 mmol/L Final  . Chloride 05/12/2019 108  98 - 111 mmol/L Final  . CO2 05/12/2019 25  22 - 32 mmol/L Final  . Glucose, Bld 05/12/2019 78  70 - 99 mg/dL Final  . BUN 05/12/2019 17  8 - 23 mg/dL Final  . Creatinine, Ser 05/12/2019 0.64  0.44 - 1.00 mg/dL Final  . Calcium 05/12/2019 8.5* 8.9 - 10.3 mg/dL Final  . GFR calc non Af Amer 05/12/2019 >60  >60 mL/min Final  . GFR calc Af Amer 05/12/2019 >60  >60 mL/min Final  . Georgiann Hahn  gap 05/12/2019 7  5 - 15 Final   Performed at  [facility], [address], [place], [place] [address]    Assessment:  Maureen [name] is a 78 y.o. female  stage IV CLL. She was diagnosed in [place] in 2002. Bone marrowon 03/2008 revealed 90% cellularity with diffuse involvement by CLL. Karyotype was normal. FISHstudies on 08/19/2018 revealed loss of one ATM, homologous deletion of 13q, and normal CCDN1/IGH, chromosome 12, and TP53.  She received FCRx 4 cycles (last 08/06/2008). She had a partial remission. She received single agent Rituxanx 7. Treatment was held secondary to neutropenia.  She received 6 cycles of Gazyva(07/26/2016 - 01/23/2017).  Chest, abdomen, and pelvisCTon 10/17/2017 revealed significant partial treatment response. There was mild axillary, mediastinal, hilar, retroperitoneal, mesenteric and bilateral pelvic adenopathy, all significantly decreased in the interval. There was mild splenomegaly (13 cm), significantly decreased. There was a new 3.4 cm masslike focus of hypoenhancement in the posterior upper right kidney, indeterminate. There was a stable 3.1 cm infrarenal abdominal aortic aneurysm  Abdomen and pelvisCTon 02/13/2018 revealed patchy enhancement of the right kidney concerning for pyelonephritis. Additionally there was urothelial thickening and hyperenhancement involving the right renal collecting system and ureter compatible with ascending urinary tract infection. There was similar-appearing abdominal and retroperitoneal adenopathy.  She beganibrutinibon 03/25/2019.  She has a history of a skin rashs/p anterior thigh biopsy in 2012. 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 She was treated with a course of doxycycline, topical Clobetasol, and hydroxyzine for itching. She states that she gets the rash on her legs in the summertime.  She was diagnosed with B12 deficiencyin 2012. Shere-started oral B12on  09/24/2018. B12 was 237 on 03/11/202 and570 on 10/21/2018.  Symptomatically, she has been fatigued x 2 days.  She denies any fevers or sweats.  Weight is stable.  Hemoglobin is 6.1.  Plan: 1.   Review labs from 05/12/2019. 2. Chronic lymphocytic leukemia (CLL) Clinically, she is doing well. Exam reveals resolution of adenopathy. Weight remains stable.  She denies any B symptoms. Ibrutinib began on 03/25/2019. She continues to tolerate well. WBC appears to be decreasing (393,000 to 333,300 to 314,100). Lymphocytosis expected with initiation of therapy and typically resolves within 14 weeks. Monitor for leukostasis (uncommon).                    Watch for WBC >= 400,000 as cases of intracranial hemorrhage, lethargy, headache, and gait instability.                         Continue good hydration.. Continue to monitor weekly. 3. Anemia Hematocrit 19.7. Hemoglobin 6.1. MCV 108.6. Etiology secondary to CLL.Retic was 0.7% on 03/25/2019. Coombs was negative.Haptoglobin was normal. No evidence of hemolysis. Patient on B12 and folate.TSH was normal. Ferritin was244on 03/18/2019. Patient has received 3 units PRBCs to date.             Transfuse 1 unit of PRBCs today.  Continue to draw a hold tube weekly in case additional transfusions needed.  Continue judicious transfusion if symptomatic given elevated WBC. 4.B12 deficiency B12 level was 570 on 10/21/2018. Folate was 36 on 09/24/2018. Patient continues on oral B12 and folate. 5.Elevated iron saturation Iron saturation was 86%on 03/18/2019. Continue to monitor. 6.   RTC weekly x 3 for labs (CBC with diff, hold tube). 7.   RN to call patient weekly with CBC. 8.   RTC in  3  weeks for MD assessment, labs (CBC with diff, CMP, hold tube- day before), and +/- PRBC transfusion next day.  I discussed the assessment and treatment plan with the patient.  The patient was provided an opportunity to ask questions and all were answered.  The patient agreed with the plan and demonstrated an understanding of the instructions.  The patient was advised to call back if the symptoms worsen or if the condition fails to improve as anticipated.   Lequita Asal, MD, PhD    05/13/2019, 9:36 AM  I, Selena Batten, am acting as scribe for [employer]. Mike Gip, MD, PhD.  I, Melissa C. Mike Gip, MD, have reviewed the above documentation for accuracy and completeness, and I agree with the above.

## 2019-05-11 ENCOUNTER — Other Ambulatory Visit: Payer: Self-pay

## 2019-05-12 ENCOUNTER — Other Ambulatory Visit: Payer: Self-pay

## 2019-05-12 ENCOUNTER — Telehealth: Payer: Self-pay | Admitting: *Deleted

## 2019-05-12 ENCOUNTER — Other Ambulatory Visit: Payer: Self-pay | Admitting: Hematology and Oncology

## 2019-05-12 ENCOUNTER — Inpatient Hospital Stay: Payer: PPO

## 2019-05-12 DIAGNOSIS — D649 Anemia, unspecified: Secondary | ICD-10-CM

## 2019-05-12 DIAGNOSIS — E538 Deficiency of other specified B group vitamins: Secondary | ICD-10-CM

## 2019-05-12 DIAGNOSIS — C9110 Chronic lymphocytic leukemia of B-cell type not having achieved remission: Secondary | ICD-10-CM

## 2019-05-12 LAB — BASIC METABOLIC PANEL
Anion gap: 7 (ref 5–15)
BUN: 17 mg/dL (ref 8–23)
CO2: 25 mmol/L (ref 22–32)
Calcium: 8.5 mg/dL — ABNORMAL LOW (ref 8.9–10.3)
Chloride: 108 mmol/L (ref 98–111)
Creatinine, Ser: 0.64 mg/dL (ref 0.44–1.00)
GFR calc Af Amer: >60 mL/min (ref >60)
GFR calc non Af Amer: >60 mL/min (ref >60)
Glucose, Bld: 78 mg/dL (ref 70–99)
Potassium: 3.9 mmol/L (ref 3.5–5.1)
Sodium: 140 mmol/L (ref 135–145)

## 2019-05-12 LAB — CBC WITH DIFFERENTIAL/PLATELET
Abs Immature Granulocytes: 0.34 10*3/uL — ABNORMAL HIGH (ref 0.00–0.07)
Basophils Absolute: 0 10*3/uL (ref 0.0–0.1)
Basophils Relative: 0 %
Eosinophils Absolute: 0 10*3/uL (ref 0.0–0.5)
Eosinophils Relative: 0 %
HCT: 19.7 % — ABNORMAL LOW (ref 36.0–46.0)
Hemoglobin: 6.1 g/dL — ABNORMAL LOW (ref 12.0–15.0)
Immature Granulocytes: 0 %
Lymphocytes Relative: 99 %
Lymphs Abs: 309.4 10*3/uL — ABNORMAL HIGH (ref 0.7–4.0)
MCH: 26.2 pg (ref 26.0–34.0)
MCHC: 24.1 g/dL — ABNORMAL LOW (ref 30.0–36.0)
MCV: 108.6 fL — ABNORMAL HIGH (ref 80.0–100.0)
Monocytes Absolute: 1.1 10*3/uL — ABNORMAL HIGH (ref 0.1–1.0)
Monocytes Relative: 0 %
Neutro Abs: 3.2 10*3/uL (ref 1.7–7.7)
Neutrophils Relative %: 1 %
Platelets: 88 10*3/uL — ABNORMAL LOW (ref 150–400)
RBC: 2.01 MIL/uL — ABNORMAL LOW (ref 3.87–5.11)
WBC: 314.1 10*3/uL (ref 4.0–10.5)
nRBC: 0 % (ref 0.0–0.2)

## 2019-05-12 LAB — SAMPLE TO BLOOD BANK

## 2019-05-12 LAB — PREPARE RBC (CROSSMATCH)

## 2019-05-12 NOTE — Progress Notes (Signed)
Patient here for follow up. Reports some fatigue.Denies any concerns.

## 2019-05-12 NOTE — Telephone Encounter (Signed)
Maureen [name] in the lab called with today's blood results:  WBC 314 and Hgb 6.1.  MD notified.

## 2019-05-13 ENCOUNTER — Inpatient Hospital Stay: Payer: PPO

## 2019-05-13 ENCOUNTER — Encounter: Payer: Self-pay | Admitting: Hematology and Oncology

## 2019-05-13 ENCOUNTER — Other Ambulatory Visit: Payer: Self-pay

## 2019-05-13 ENCOUNTER — Inpatient Hospital Stay (HOSPITAL_BASED_OUTPATIENT_CLINIC_OR_DEPARTMENT_OTHER): Payer: PPO | Admitting: Hematology and Oncology

## 2019-05-13 VITALS — BP 133/32 | HR 66 | Temp 97.6°F | Resp 16 | Wt 121.6 lb

## 2019-05-13 DIAGNOSIS — R790 Abnormal level of blood mineral: Secondary | ICD-10-CM | POA: Diagnosis not present

## 2019-05-13 DIAGNOSIS — E538 Deficiency of other specified B group vitamins: Secondary | ICD-10-CM

## 2019-05-13 DIAGNOSIS — C9110 Chronic lymphocytic leukemia of B-cell type not having achieved remission: Secondary | ICD-10-CM

## 2019-05-13 DIAGNOSIS — D649 Anemia, unspecified: Secondary | ICD-10-CM | POA: Diagnosis not present

## 2019-05-13 MED ORDER — ACETAMINOPHEN 325 MG PO TABS
650.0000 mg | ORAL_TABLET | Freq: Once | ORAL | Status: AC
  Administered 2019-05-13: 09:00:00 650 mg via ORAL

## 2019-05-13 MED ORDER — DIPHENHYDRAMINE HCL 25 MG PO CAPS
25.0000 mg | ORAL_CAPSULE | Freq: Once | ORAL | Status: AC
  Administered 2019-05-13: 25 mg via ORAL

## 2019-05-13 MED ORDER — SODIUM CHLORIDE 0.9% IV SOLUTION
250.0000 mL | Freq: Once | INTRAVENOUS | Status: AC
  Administered 2019-05-13: 250 mL via INTRAVENOUS
  Filled 2019-05-13: qty 250

## 2019-05-13 NOTE — Patient Instructions (Signed)
Blood Transfusion, Adult, Care After This sheet gives you information about how to care for yourself after your procedure. Your doctor may also give you more specific instructions. If you have problems or questions, contact your doctor. Follow these instructions at home:   Take over-the-counter and prescription medicines only as told by your doctor.  Go back to your normal activities as told by your doctor.  Follow instructions from your doctor about how to take care of the area where an IV tube was put into your vein (insertion site). Make sure you: ? Wash your hands with soap and water before you change your bandage (dressing). If there is no soap and water, use hand sanitizer. ? Change your bandage as told by your doctor.  Check your IV insertion site every day for signs of infection. Check for: ? More redness, swelling, or pain. ? More fluid or blood. ? Warmth. ? Pus or a bad smell. Contact a doctor if:  You have more redness, swelling, or pain around the IV insertion site.  You have more fluid or blood coming from the IV insertion site.  Your IV insertion site feels warm to the touch.  You have pus or a bad smell coming from the IV insertion site.  Your pee (urine) turns pink, red, or brown.  You feel weak after doing your normal activities. Get help right away if:  You have signs of a serious allergic or body defense (immune) system reaction, including: ? Itchiness. ? Hives. ? Trouble breathing. ? Anxiety. ? Pain in your chest or lower back. ? Fever, flushing, and chills. ? Fast pulse. ? Rash. ? Watery poop (diarrhea). ? Throwing up (vomiting). ? Dark pee. ? Serious headache. ? Dizziness. ? Stiff neck. ? Yellow color in your face or the white parts of your eyes (jaundice). Summary  After a blood transfusion, return to your normal activities as told by your doctor.  Every day, check for signs of infection where the IV tube was put into your vein.  Some  signs of infection are warm skin, more redness and pain, more fluid or blood, and pus or a bad smell where the needle went in.  Contact your doctor if you feel weak or have any unusual symptoms. This information is not intended to replace advice given to you by your health care provider. Make sure you discuss any questions you have with your health care provider. Document Released: 07/23/2014 Document Revised: 11/06/2017 Document Reviewed: 02/24/2016 Elsevier Patient Education  2020 [employer] Inc[employer]  

## 2019-05-14 LAB — TYPE AND SCREEN
ABO/RH(D): O POS
Antibody Screen: NEGATIVE
Unit division: 0

## 2019-05-14 LAB — BPAM RBC
Blood Product Expiration Date: 202011042359
ISSUE DATE / TIME: 202010281020
Unit Type and Rh: 9500

## 2019-05-18 ENCOUNTER — Other Ambulatory Visit: Payer: Self-pay

## 2019-05-18 MED FILL — IMBRUVICA 420 MG TAB: 420 | 28 days supply | Qty: 28 | Fill #2

## 2019-05-19 ENCOUNTER — Other Ambulatory Visit: Payer: Self-pay

## 2019-05-19 ENCOUNTER — Inpatient Hospital Stay: Payer: PPO | Attending: Hematology and Oncology

## 2019-05-19 ENCOUNTER — Other Ambulatory Visit: Payer: Self-pay | Admitting: Nurse Practitioner

## 2019-05-19 ENCOUNTER — Telehealth: Payer: Self-pay | Admitting: *Deleted

## 2019-05-19 DIAGNOSIS — C9110 Chronic lymphocytic leukemia of B-cell type not having achieved remission: Secondary | ICD-10-CM

## 2019-05-19 DIAGNOSIS — I714 Abdominal aortic aneurysm, without rupture: Secondary | ICD-10-CM | POA: Diagnosis not present

## 2019-05-19 DIAGNOSIS — Z79899 Other long term (current) drug therapy: Secondary | ICD-10-CM | POA: Insufficient documentation

## 2019-05-19 DIAGNOSIS — F17210 Nicotine dependence, cigarettes, uncomplicated: Secondary | ICD-10-CM | POA: Insufficient documentation

## 2019-05-19 DIAGNOSIS — D649 Anemia, unspecified: Secondary | ICD-10-CM | POA: Insufficient documentation

## 2019-05-19 DIAGNOSIS — E538 Deficiency of other specified B group vitamins: Secondary | ICD-10-CM | POA: Insufficient documentation

## 2019-05-19 LAB — COMPREHENSIVE METABOLIC PANEL
ALT: 14 U/L (ref 0–44)
AST: 19 U/L (ref 15–41)
Albumin: 4.3 g/dL (ref 3.5–5.0)
Alkaline Phosphatase: 53 U/L (ref 38–126)
Anion gap: 7 (ref 5–15)
BUN: 16 mg/dL (ref 8–23)
CO2: 24 mmol/L (ref 22–32)
Calcium: 8.6 mg/dL — ABNORMAL LOW (ref 8.9–10.3)
Chloride: 109 mmol/L (ref 98–111)
Creatinine, Ser: 0.61 mg/dL (ref 0.44–1.00)
GFR calc Af Amer: >60 mL/min (ref >60)
GFR calc non Af Amer: >60 mL/min (ref >60)
Glucose, Bld: 95 mg/dL (ref 70–99)
Potassium: 4.7 mmol/L (ref 3.5–5.1)
Sodium: 140 mmol/L (ref 135–145)
Total Bilirubin: 0.9 mg/dL (ref 0.3–1.2)
Total Protein: 6.1 g/dL — ABNORMAL LOW (ref 6.5–8.1)

## 2019-05-19 LAB — CBC
HCT: 25.9 % — ABNORMAL LOW (ref 36.0–46.0)
Hemoglobin: 6.6 g/dL — ABNORMAL LOW (ref 12.0–15.0)
MCH: 25.9 pg — ABNORMAL LOW (ref 26.0–34.0)
MCHC: 25.5 g/dL — ABNORMAL LOW (ref 30.0–36.0)
MCV: 101.6 fL — ABNORMAL HIGH (ref 80.0–100.0)
Platelets: 83 10*3/uL — ABNORMAL LOW (ref 150–400)
RBC: 2.55 MIL/uL — ABNORMAL LOW (ref 3.87–5.11)
WBC: 251.2 10*3/uL (ref 4.0–10.5)
nRBC: 0 % (ref 0.0–0.2)

## 2019-05-19 LAB — SAMPLE TO BLOOD BANK

## 2019-05-19 LAB — PREPARE RBC (CROSSMATCH)

## 2019-05-19 NOTE — Telephone Encounter (Signed)
Received call from [facility] in [facility] with critical value; WBC 251.2 Read back . Informed Beckey Rutter of that result. Result is lower than last blood count.

## 2019-05-20 ENCOUNTER — Inpatient Hospital Stay: Payer: PPO

## 2019-05-20 VITALS — BP 124/69 | HR 64 | Temp 98.9°F | Resp 17

## 2019-05-20 DIAGNOSIS — C9110 Chronic lymphocytic leukemia of B-cell type not having achieved remission: Secondary | ICD-10-CM

## 2019-05-20 DIAGNOSIS — D649 Anemia, unspecified: Secondary | ICD-10-CM

## 2019-05-20 MED ORDER — ACETAMINOPHEN 325 MG PO TABS
650.0000 mg | ORAL_TABLET | Freq: Once | ORAL | Status: AC
  Administered 2019-05-20: 650 mg via ORAL
  Filled 2019-05-20: qty 2

## 2019-05-20 MED ORDER — SODIUM CHLORIDE 0.9% IV SOLUTION
250.0000 mL | Freq: Once | INTRAVENOUS | Status: AC
  Administered 2019-05-20: 250 mL via INTRAVENOUS
  Filled 2019-05-20: qty 250

## 2019-05-20 MED ORDER — DIPHENHYDRAMINE HCL 25 MG PO CAPS
ORAL_CAPSULE | ORAL | Status: AC
  Filled 2019-05-20: qty 1

## 2019-05-20 MED ORDER — DIPHENHYDRAMINE HCL 25 MG PO TABS
25.0000 mg | ORAL_TABLET | Freq: Once | ORAL | Status: AC
  Administered 2019-05-20: 10:00:00 25 mg via ORAL
  Filled 2019-05-20: qty 1

## 2019-05-20 NOTE — Patient Instructions (Signed)
Blood Transfusion, Adult, Care After This sheet gives you information about how to care for yourself after your procedure. Your doctor may also give you more specific instructions. If you have problems or questions, contact your doctor. Follow these instructions at home:   Take over-the-counter and prescription medicines only as told by your doctor.  Go back to your normal activities as told by your doctor.  Follow instructions from your doctor about how to take care of the area where an IV tube was put into your vein (insertion site). Make sure you: ? Wash your hands with soap and water before you change your bandage (dressing). If there is no soap and water, use hand sanitizer. ? Change your bandage as told by your doctor.  Check your IV insertion site every day for signs of infection. Check for: ? More redness, swelling, or pain. ? More fluid or blood. ? Warmth. ? Pus or a bad smell. Contact a doctor if:  You have more redness, swelling, or pain around the IV insertion site.  You have more fluid or blood coming from the IV insertion site.  Your IV insertion site feels warm to the touch.  You have pus or a bad smell coming from the IV insertion site.  Your pee (urine) turns pink, red, or brown.  You feel weak after doing your normal activities. Get help right away if:  You have signs of a serious allergic or body defense (immune) system reaction, including: ? Itchiness. ? Hives. ? Trouble breathing. ? Anxiety. ? Pain in your chest or lower back. ? Fever, flushing, and chills. ? Fast pulse. ? Rash. ? Watery poop (diarrhea). ? Throwing up (vomiting). ? Dark pee. ? Serious headache. ? Dizziness. ? Stiff neck. ? Yellow color in your face or the white parts of your eyes (jaundice). Summary  After a blood transfusion, return to your normal activities as told by your doctor.  Every day, check for signs of infection where the IV tube was put into your vein.  Some  signs of infection are warm skin, more redness and pain, more fluid or blood, and pus or a bad smell where the needle went in.  Contact your doctor if you feel weak or have any unusual symptoms. This information is not intended to replace advice given to you by your health care provider. Make sure you discuss any questions you have with your health care provider. Document Released: 07/23/2014 Document Revised: 11/06/2017 Document Reviewed: 02/24/2016 Elsevier Patient Education  2020 [employer] Inc[employer]  

## 2019-05-21 LAB — TYPE AND SCREEN
ABO/RH(D): O POS
Antibody Screen: NEGATIVE
Unit division: 0

## 2019-05-21 LAB — BPAM RBC
Blood Product Expiration Date: 202011252359
ISSUE DATE / TIME: 202011041020
Unit Type and Rh: 5100

## 2019-05-26 ENCOUNTER — Telehealth: Payer: Self-pay | Admitting: *Deleted

## 2019-05-26 ENCOUNTER — Inpatient Hospital Stay: Payer: PPO

## 2019-05-26 ENCOUNTER — Other Ambulatory Visit: Payer: Self-pay

## 2019-05-26 DIAGNOSIS — C9110 Chronic lymphocytic leukemia of B-cell type not having achieved remission: Secondary | ICD-10-CM

## 2019-05-26 LAB — CBC
HCT: 22.6 % — ABNORMAL LOW (ref 36.0–46.0)
Hemoglobin: 7 g/dL — ABNORMAL LOW (ref 12.0–15.0)
MCH: 26.5 pg (ref 26.0–34.0)
MCHC: 26.1 g/dL — ABNORMAL LOW (ref 30.0–36.0)
MCV: 101.5 fL — ABNORMAL HIGH (ref 80.0–100.0)
Platelets: 91 10*3/uL — ABNORMAL LOW (ref 150–400)
RBC: 2.38 MIL/uL — ABNORMAL LOW (ref 3.87–5.11)
WBC: 254.2 10*3/uL (ref 4.0–10.5)
nRBC: 0 % (ref 0.0–0.2)

## 2019-05-26 LAB — SAMPLE TO BLOOD BANK

## 2019-05-26 NOTE — Telephone Encounter (Signed)
RN called patient to ask how she is feeling. Patient reports she is feeling good. Has energy. Just got done mowing her grass. I informed her hgb is up to 7.0. She said great. She does not need blood and will see [place] next week. Instructed her to call us back if she feels weak or short of breath.

## 2019-05-26 NOTE — Telephone Encounter (Signed)
Received call from [facility] in [facility] lab with critical WBC count of 254.17. Read back performed.Notified Dr Mike Gip of lab result. Hgb today is 7.0 HCT 22.6 Plts 91.

## 2019-05-31 NOTE — Progress Notes (Signed)
[facility]  [address] [place], [place] [address] Phone: [phone]  Fax: [phone]   Clinic Day:  06/03/2019  Referring physician: Juline Patch, MD  Chief Complaint: Maureen [name] is a 78 y.o. female with recurrent stage IV CLLon ibrutinib who is seen for 3 week assessment.   HPI: The patient was last seen in the medical oncology clinic on 05/13/2019. At that time, she had been fatigued x 2 days. She denied any fevers or sweats. Weight was stable. Hematocrit 19.7. Hemoglobin was 6.1. MCV 108.6. Patient continued on oral B12 and folate.   Patient had a PRBC transfusion on 05/20/2019.   Labs on followed: 05/19/2019: Hematocrit 25.9, hemoglobin 6.6, MCV 101.6, platelets 83,000, WBC 251,200.  05/26/2019: Hematocrit 22.6, hemoglobin 7.0, MCV 101.5, platelets 91,000, WBC 254,200.  06/02/2019: Hematocrit 18.9, hemoglobin 5.8, MCV 103.0, platelets 119,000, WBC 269,600.   During the interim, she has felt "ok". She is staying active around the house. She denies any dizziness, lightheadedness, chest pain, or shortness of breath. She has a good appetite. She is not fatigued. Urine is normal color. Her bowels are softer secondary to milk.    Past Medical History:  Diagnosis Date   Anemia    CLL (chronic lymphocytic leukemia) ([place]) 2002   CLL (chronic lymphocytic leukemia) ([place])    Itching    Vitamin B 12 deficiency     Past Surgical History:  Procedure Laterality Date   breeast mass removed  1988   benign    Family History  Family history unknown: Yes    Social History:  reports that she has been smoking cigarettes. She has a 28.00 pack-year smoking history. She has never used smokeless tobacco. She reports current alcohol use of about 1.0 standard drinks of alcohol per week. She reports that she does not use drugs. She is from [place]. 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 Her husband would often do tours of  duty in [place].She lives in [place]. She has 2 dogs and 3 cats. The patient is alone today.  Allergies: No Known Allergies  Current Medications: Current Outpatient Medications  Medication Sig Dispense Refill   folic acid (FOLVITE) 701 MCG tablet Take 800 mcg by mouth 3 (three) times a week.      Ibuprofen 200 MG CAPS Take 1 capsule by mouth as needed (PAIN).     vitamin B-12 (CYANOCOBALAMIN) 1000 MCG tablet Take 1,000 mcg by mouth daily.     IMBRUVICA 420 MG TABS Take 1 tablet by mouth daily.      No current facility-administered medications for this visit.     Review of Systems  Constitutional: Negative for chills, diaphoresis, fever, malaise/fatigue and weight loss (up 3 pounds).       Feels "ok".  Active around the house.  HENT: Negative.  Negative for congestion, ear pain, nosebleeds, sinus pain and sore throat.   Eyes: Negative.  Negative for blurred vision, double vision and photophobia.  Respiratory: Negative.  Negative for cough, hemoptysis, sputum production and shortness of breath.   Cardiovascular: Negative.  Negative for chest pain, palpitations, orthopnea and leg swelling.  Gastrointestinal: Negative.  Negative for abdominal pain, blood in stool, constipation, diarrhea, melena, nausea and vomiting.       Soft stool secondary to milk. Good appetite.  Genitourinary: Negative.  Negative for dysuria, flank pain, frequency, hematuria and urgency.  Musculoskeletal: Negative.  Negative for back pain, falls, joint pain, myalgias and neck  pain.  Skin: Negative.  Negative for rash.  Neurological: Negative.  Negative for dizziness, tingling, sensory change, speech change, focal weakness, weakness and headaches.  Endo/Heme/Allergies: Negative.  Does not bruise/bleed easily.  Psychiatric/Behavioral: Negative.  Negative for depression and memory loss. The patient is not nervous/anxious and does not have insomnia.   All other systems reviewed and are negative.  Performance status  (ECOG): 1  Vitals Blood pressure (!) 136/44, pulse 72, temperature 97.9 F (36.6 C), temperature source Tympanic, resp. rate 16, weight 124 lb 12.5 oz (56.6 kg), SpO2 100 %.  Physical Exam  Constitutional: She is oriented to person, place, and time.  Thin woman sitting comfortably in the exam room in no acute distress.  HENT:  Head: Normocephalic and atraumatic.  Right Ear: Tympanic membrane, external ear and ear canal normal.  Left Ear: Tympanic membrane, external ear and ear canal normal.  Nose: Nose normal. No rhinorrhea.  Mouth/Throat: Oropharynx is clear and moist. No oropharyngeal exudate.  Wearing a toboggan and mask.  Eyes: Pupils are equal, round, and reactive to light. Conjunctivae and EOM are normal. No scleral icterus.  Blue eyes.   Neck: Normal range of motion. Neck supple. No JVD present.  Cardiovascular: Normal rate, regular rhythm and normal heart sounds. Exam reveals no gallop and no friction rub.  No murmur heard. Pulmonary/Chest: Effort normal and breath sounds normal. No respiratory distress. She has no wheezes. She has no rales.  Abdominal: Soft. Bowel sounds are normal. She exhibits no distension and no mass. There is no abdominal tenderness. There is no rebound and no guarding.  Musculoskeletal: Normal range of motion.        General: No tenderness or edema.  Lymphadenopathy:       Head (right side): No preauricular, no posterior auricular and no occipital adenopathy present.       Head (left side): No preauricular, no posterior auricular and no occipital adenopathy present.    She has no cervical adenopathy.    She has no axillary adenopathy.       Right: No inguinal and no supraclavicular adenopathy present.       Left: No inguinal and no supraclavicular adenopathy present.  Neurological: She is alert and oriented to person, place, and time. She has normal reflexes.  Skin: Skin is warm and dry. No petechiae, no purpura and no rash noted. She is not  diaphoretic. No erythema. No pallor.  Psychiatric: She has a normal mood and affect. Her behavior is normal. Judgment and thought content normal.  Nursing note and vitals reviewed.   Orders Only on 06/02/2019  Component Date Value Ref Range Status   ABO/RH(D) 06/02/2019 O POS   Final   Antibody Screen 06/02/2019 NEG   Final   Sample Expiration 06/02/2019 06/05/2019,2359   Final   Unit Number 06/02/2019    Final                   WLNLG:[id number] Performed at [facility], [address] [place], [place] [address]    Blood Component Type 06/02/2019    Final                   Value:RBC, [facility] IRR Performed at [facility], [address] [place], [place] [address]    Unit division 06/02/2019    Final                   Value:00 Performed at [facility], [address] [place], [place] [address]    Status of Unit 06/02/2019    Final                   Value:ALLOCATED Performed at [facility], [address], [place], [place] [address]    Transfusion Status 06/02/2019 OK TO TRANSFUSE   Final   Crossmatch Result 06/02/2019    Final                   Value:Compatible Performed at [facility], [address] [place], [place] [address]    Order Confirmation 06/02/2019    Final                   Value:ORDER PROCESSED BY BLOOD BANK Performed at [facility], [address], [place], [place] [address]    Blood Product Unit Number 06/02/2019 [id number]   Final   PRODUCT CODE 06/02/2019 [id number]   Final   Unit Type and Rh 06/02/2019 9500   Final   Blood Product Expiration Date 06/02/2019 [id number]   Final  Appointment on 06/02/2019  Component Date Value Ref Range Status   Blood Bank Specimen 06/02/2019 SAMPLE AVAILABLE FOR TESTING   Final   Sample Expiration 06/02/2019    Final                   Value:06/05/2019,2359 Performed at [facility], [address] [place], [place] [address]     WBC 06/02/2019 269.6* 4.0 - 10.5 K/uL Final   This critical result has verified and been called to [place] by Volney Presser on 11 17 2020 at 0956, and has been read back.    RBC 06/02/2019 2.04* 3.87 - 5.11 MIL/uL Final   Hemoglobin 06/02/2019 5.8* 12.0 - 15.0 g/dL Final   HCT 06/02/2019 18.9* 36.0 - 46.0 % Final   MCV 06/02/2019 103.0* 80.0 - 100.0 fL Final   MCH 06/02/2019 25.1* 26.0 - 34.0 pg Final   MCHC 06/02/2019 24.5* 30.0 - 36.0 g/dL Final   RDW 06/02/2019 NCAL  11.5 - 15.5 % Final   Platelets 06/02/2019 119* 150 - 400 K/uL Final   Comment: Immature Platelet Fraction may be clinically indicated, consider ordering this additional test [id number]    nRBC 06/02/2019 0.0  0.0 - 0.2 % Final   Performed at [facility], [address], [place], [place] [address]    Assessment:  Maureen [name] is a 78 y.o. female with stage IV CLL. She was diagnosed in [place] in 2002. Bone marrowon 03/2008 revealed 90% cellularity with diffuse involvement by CLL. Karyotype was normal. FISHstudies on 08/19/2018 revealed loss of one ATM, homologous deletion of 13q, and normal CCDN1/IGH, chromosome 12, and TP53.  She received FCRx 4 cycles (last 08/06/2008). She had a partial remission. She received single agent Rituxanx 7. Treatment was held secondary to neutropenia.  She received 6 cycles of Gazyva(07/26/2016 - 01/23/2017).  Chest, abdomen, and pelvisCTon 10/17/2017 revealed significant partial treatment response. There was mild axillary, mediastinal, hilar, retroperitoneal, mesenteric and bilateral pelvic adenopathy, all significantly decreased in the interval. There was mild splenomegaly (13 cm), significantly decreased. There was a new 3.4 cm masslike focus of hypoenhancement in the posterior upper right kidney, indeterminate. There was a stable 3.1 cm infrarenal abdominal aortic aneurysm  Abdomen and pelvisCTon 02/13/2018 revealed patchy  enhancement of the right kidney concerning for pyelonephritis. Additionally there was urothelial thickening and hyperenhancement involving the right renal collecting system and  ureter compatible with ascending urinary tract infection. There was similar-appearing abdominal and retroperitoneal adenopathy.  She beganibrutinibon 03/25/2019.  She has received 5 units of PRBCs to date (first 04/09/2019; last 05/20/2019).  Peak WBC was 393,300 04/29/2019.  She has a history of a skin rashs/p anterior thigh biopsy in 2012. 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 She was treated with a course of doxycycline, topical Clobetasol, and hydroxyzine for itching. She states that she gets the rash on her legs in the summertime.  She was diagnosed with B12 deficiencyin 2012. Shere-started oral B12on 09/24/2018. B12 was 237 on 03/11/202 and570 on 10/21/2018.  Symptomatically, she is doing well.  She denies any B symptoms.  She denies any bruising or bleeding.  She remains active.  Hemoglobin 5.8.  Platelets 119,000.  WBC 269,600.  Plan: 1.   Review interval labs.  2. Chronic lymphocytic leukemia (CLL) Clinically, she continues to do well. Exam reveals no adenopathy or hepatosplenomegaly. Weight is stable to improved (heavier clothes today).   She denies any B symptoms. Ibrutinib began on 03/25/2019. She is tolerating well. WBCis decreasing (393,300 to 269,600). Lymphocytosis expected with initiation of therapy and typically resolves within 14 weeks. Monitor for leukostasis (uncommon). Watch for WBC >= 400,000 as cases ofintracranial hemorrhage, lethargy, headache, and gait instability. Continue to encourage good hydration. Monitor counts weekly. 3.  Anemia Hematocrit18.9. Hemoglobin5.8. MCV 103.0. Etiology felt secondary to CLL.Retic was 0.7% on 03/25/2019. Coombs was negative.Haptoglobin was normal. No evidence of hemolysis. Patient on B12 and folate.TSH was normal. Ferritin was244on 03/18/2019. Patient has received 5 units PRBCs to date. Plan to transfuse 1 unit today and 1 unit on 06/06/2019.   Patient in agreement.             Draw a hold tube weekly in case additional transfusions needed.             Plan additional labs to ensure all other potential etiologies of anemia addressed. 4.B12 deficiency B12 level was 570 on 10/21/2018. Folate was 36 on 09/24/2018. Patient remains on oral B12 and folate.  Check levels at next lab draw. 5.Elevated iron saturation Iron saturation was 86%on 03/18/2019. Continue to monitor. 6.   RTC in 1 week for labs (CBC with diff, CMP, retic, B12, folate, ferritin, hold tube). 7.   RTC in 2 weeks and 3 weeks for labs (CBC with diff, hold tube). 8.   RTC in 4 weeks for MD assessment, labs (CBC with diff, BMP, hold tube.  I discussed the assessment and treatment plan with the patient.  The patient was provided an opportunity to ask questions and all were answered.  The patient agreed with the plan and demonstrated an understanding of the instructions.  The patient was advised to call back if the symptoms worsen or if the condition fails to improve as anticipated.   Lequita Asal, MD, PhD    06/03/2019, 9:10 AM  I, Selena Batten, am acting as scribe for [employer]. Mike Gip, MD, PhD.  I, Sydelle Sherfield C. Mike Gip, MD, have reviewed the above documentation for accuracy and completeness, and I agree with the above.

## 2019-06-01 ENCOUNTER — Other Ambulatory Visit: Payer: Self-pay

## 2019-06-02 ENCOUNTER — Inpatient Hospital Stay: Payer: PPO

## 2019-06-02 ENCOUNTER — Other Ambulatory Visit: Payer: Self-pay | Admitting: *Deleted

## 2019-06-02 ENCOUNTER — Other Ambulatory Visit: Payer: Self-pay | Admitting: Hematology and Oncology

## 2019-06-02 ENCOUNTER — Telehealth: Payer: Self-pay

## 2019-06-02 ENCOUNTER — Encounter: Payer: Self-pay | Admitting: Hematology and Oncology

## 2019-06-02 ENCOUNTER — Telehealth: Payer: Self-pay | Admitting: *Deleted

## 2019-06-02 ENCOUNTER — Other Ambulatory Visit: Payer: Self-pay

## 2019-06-02 DIAGNOSIS — C9110 Chronic lymphocytic leukemia of B-cell type not having achieved remission: Secondary | ICD-10-CM

## 2019-06-02 DIAGNOSIS — D649 Anemia, unspecified: Secondary | ICD-10-CM

## 2019-06-02 LAB — CBC
HCT: 18.9 % — ABNORMAL LOW (ref 36.0–46.0)
Hemoglobin: 5.8 g/dL — ABNORMAL LOW (ref 12.0–15.0)
MCH: 25.1 pg — ABNORMAL LOW (ref 26.0–34.0)
MCHC: 24.5 g/dL — ABNORMAL LOW (ref 30.0–36.0)
MCV: 103 fL — ABNORMAL HIGH (ref 80.0–100.0)
Platelets: 119 10*3/uL — ABNORMAL LOW (ref 150–400)
RBC: 2.04 MIL/uL — ABNORMAL LOW (ref 3.87–5.11)
WBC: 269.6 10*3/uL (ref 4.0–10.5)
nRBC: 0 % (ref 0.0–0.2)

## 2019-06-02 LAB — SAMPLE TO BLOOD BANK

## 2019-06-02 LAB — PREPARE RBC (CROSSMATCH)

## 2019-06-02 NOTE — Telephone Encounter (Signed)
Left message for patient to return call to get lab results and to discuss transfusion (s).

## 2019-06-02 NOTE — Telephone Encounter (Signed)
Left a message on the patient voice mail to inform her to call back about her blood work form today. The patient will need a blood transfusion hbg today 5.8.She is already schedule for transfusion for tomorrow I will ask Dr Mike Gip to add in labs for the patient.

## 2019-06-02 NOTE — Telephone Encounter (Signed)
Left a message to inform the patient to contact the office and ask for me or Erasmo Downer

## 2019-06-02 NOTE — Telephone Encounter (Signed)
Lab called with results for today:  WBC 269.6, Hgb 5.8, Hct 18.9.  MD made aware.

## 2019-06-03 ENCOUNTER — Inpatient Hospital Stay (HOSPITAL_BASED_OUTPATIENT_CLINIC_OR_DEPARTMENT_OTHER): Payer: PPO | Admitting: Hematology and Oncology

## 2019-06-03 ENCOUNTER — Inpatient Hospital Stay: Payer: PPO

## 2019-06-03 ENCOUNTER — Encounter: Payer: Self-pay | Admitting: Hematology and Oncology

## 2019-06-03 VITALS — BP 136/44 | HR 72 | Temp 97.9°F | Resp 16 | Wt 124.8 lb

## 2019-06-03 DIAGNOSIS — C9110 Chronic lymphocytic leukemia of B-cell type not having achieved remission: Secondary | ICD-10-CM | POA: Diagnosis not present

## 2019-06-03 DIAGNOSIS — R790 Abnormal level of blood mineral: Secondary | ICD-10-CM

## 2019-06-03 DIAGNOSIS — E538 Deficiency of other specified B group vitamins: Secondary | ICD-10-CM

## 2019-06-03 DIAGNOSIS — D649 Anemia, unspecified: Secondary | ICD-10-CM | POA: Diagnosis not present

## 2019-06-03 MED ORDER — ACETAMINOPHEN 325 MG PO TABS
650.0000 mg | ORAL_TABLET | Freq: Once | ORAL | Status: AC
  Administered 2019-06-03: 10:00:00 650 mg via ORAL

## 2019-06-03 MED ORDER — SODIUM CHLORIDE 0.9% IV SOLUTION
250.0000 mL | Freq: Once | INTRAVENOUS | Status: AC
  Administered 2019-06-03: 10:00:00 250 mL via INTRAVENOUS
  Filled 2019-06-03: qty 250

## 2019-06-03 MED ORDER — DIPHENHYDRAMINE HCL 25 MG PO CAPS
25.0000 mg | ORAL_CAPSULE | Freq: Once | ORAL | Status: AC
  Administered 2019-06-03: 25 mg via ORAL

## 2019-06-03 NOTE — Patient Instructions (Signed)
Blood Transfusion, Adult, Care After This sheet gives you information about how to care for yourself after your procedure. Your doctor may also give you more specific instructions. If you have problems or questions, contact your doctor. Follow these instructions at home:   Take over-the-counter and prescription medicines only as told by your doctor.  Go back to your normal activities as told by your doctor.  Follow instructions from your doctor about how to take care of the area where an IV tube was put into your vein (insertion site). Make sure you: ? Wash your hands with soap and water before you change your bandage (dressing). If there is no soap and water, use hand sanitizer. ? Change your bandage as told by your doctor.  Check your IV insertion site every day for signs of infection. Check for: ? More redness, swelling, or pain. ? More fluid or blood. ? Warmth. ? Pus or a bad smell. Contact a doctor if:  You have more redness, swelling, or pain around the IV insertion site.  You have more fluid or blood coming from the IV insertion site.  Your IV insertion site feels warm to the touch.  You have pus or a bad smell coming from the IV insertion site.  Your pee (urine) turns pink, red, or brown.  You feel weak after doing your normal activities. Get help right away if:  You have signs of a serious allergic or body defense (immune) system reaction, including: ? Itchiness. ? Hives. ? Trouble breathing. ? Anxiety. ? Pain in your chest or lower back. ? Fever, flushing, and chills. ? Fast pulse. ? Rash. ? Watery poop (diarrhea). ? Throwing up (vomiting). ? Dark pee. ? Serious headache. ? Dizziness. ? Stiff neck. ? Yellow color in your face or the white parts of your eyes (jaundice). Summary  After a blood transfusion, return to your normal activities as told by your doctor.  Every day, check for signs of infection where the IV tube was put into your vein.  Some  signs of infection are warm skin, more redness and pain, more fluid or blood, and pus or a bad smell where the needle went in.  Contact your doctor if you feel weak or have any unusual symptoms. This information is not intended to replace advice given to you by your health care provider. Make sure you discuss any questions you have with your health care provider. Document Released: 07/23/2014 Document Revised: 11/06/2017 Document Reviewed: 02/24/2016 Elsevier Patient Education  2020 [employer] Inc[employer]  

## 2019-06-03 NOTE — Progress Notes (Signed)
Patient here for follow up. Denies any concerns.  

## 2019-06-04 ENCOUNTER — Other Ambulatory Visit: Payer: Self-pay | Admitting: Hematology and Oncology

## 2019-06-04 ENCOUNTER — Other Ambulatory Visit: Payer: Self-pay

## 2019-06-04 DIAGNOSIS — D649 Anemia, unspecified: Secondary | ICD-10-CM

## 2019-06-04 LAB — PREPARE RBC (CROSSMATCH)

## 2019-06-05 ENCOUNTER — Inpatient Hospital Stay: Payer: PPO

## 2019-06-05 ENCOUNTER — Other Ambulatory Visit: Payer: Self-pay

## 2019-06-05 DIAGNOSIS — C9110 Chronic lymphocytic leukemia of B-cell type not having achieved remission: Secondary | ICD-10-CM | POA: Diagnosis not present

## 2019-06-05 DIAGNOSIS — D649 Anemia, unspecified: Secondary | ICD-10-CM

## 2019-06-05 MED ORDER — ACETAMINOPHEN 325 MG PO TABS
650.0000 mg | ORAL_TABLET | Freq: Once | ORAL | Status: AC
  Administered 2019-06-05: 10:00:00 650 mg via ORAL
  Filled 2019-06-05: qty 2

## 2019-06-05 MED ORDER — SODIUM CHLORIDE 0.9% IV SOLUTION
250.0000 mL | Freq: Once | INTRAVENOUS | Status: AC
  Administered 2019-06-05: 250 mL via INTRAVENOUS
  Filled 2019-06-05: qty 250

## 2019-06-05 MED ORDER — DIPHENHYDRAMINE HCL 25 MG PO CAPS
25.0000 mg | ORAL_CAPSULE | Freq: Once | ORAL | Status: AC
  Administered 2019-06-05: 10:00:00 25 mg via ORAL
  Filled 2019-06-05: qty 1

## 2019-06-05 NOTE — Patient Instructions (Signed)
Blood Transfusion, Adult, Care After This sheet gives you information about how to care for yourself after your procedure. Your doctor may also give you more specific instructions. If you have problems or questions, contact your doctor. Follow these instructions at home:   Take over-the-counter and prescription medicines only as told by your doctor.  Go back to your normal activities as told by your doctor.  Follow instructions from your doctor about how to take care of the area where an IV tube was put into your vein (insertion site). Make sure you: ? Wash your hands with soap and water before you change your bandage (dressing). If there is no soap and water, use hand sanitizer. ? Change your bandage as told by your doctor.  Check your IV insertion site every day for signs of infection. Check for: ? More redness, swelling, or pain. ? More fluid or blood. ? Warmth. ? Pus or a bad smell. Contact a doctor if:  You have more redness, swelling, or pain around the IV insertion site.  You have more fluid or blood coming from the IV insertion site.  Your IV insertion site feels warm to the touch.  You have pus or a bad smell coming from the IV insertion site.  Your pee (urine) turns pink, red, or brown.  You feel weak after doing your normal activities. Get help right away if:  You have signs of a serious allergic or body defense (immune) system reaction, including: ? Itchiness. ? Hives. ? Trouble breathing. ? Anxiety. ? Pain in your chest or lower back. ? Fever, flushing, and chills. ? Fast pulse. ? Rash. ? Watery poop (diarrhea). ? Throwing up (vomiting). ? Dark pee. ? Serious headache. ? Dizziness. ? Stiff neck. ? Yellow color in your face or the white parts of your eyes (jaundice). Summary  After a blood transfusion, return to your normal activities as told by your doctor.  Every day, check for signs of infection where the IV tube was put into your vein.  Some  signs of infection are warm skin, more redness and pain, more fluid or blood, and pus or a bad smell where the needle went in.  Contact your doctor if you feel weak or have any unusual symptoms. This information is not intended to replace advice given to you by your health care provider. Make sure you discuss any questions you have with your health care provider. Document Released: 07/23/2014 Document Revised: 11/06/2017 Document Reviewed: 02/24/2016 Elsevier Patient Education  2020 [employer] Inc[employer]  

## 2019-06-05 NOTE — Progress Notes (Signed)
UNMATCHED BLOOD PRODUCT NOTE  Compare the patient ID on the blood tag to the patient ID on the hospital armband and Blood Bank armband. Then confirm the unit number on the blood tag matches the unit number on the blood product.  If a discrepancy is discovered return the product to blood bank immediately.   Blood Product Type: Packed Red Blood Cells  Unit #: (Found on blood product bag, begins with W) [id number]  Product Code #: (Found on blood product bag, begins with E) [id number]   Start Time: 1110  Starting Rate: 155ml/hr  Rate increase/decreased  (if applicable):    982  ml/hr  Rate changed time (if applicable): 6415   Stop Time: 1310   All Other Documentation should be documented within the Blood Admin Flowsheet per policy.

## 2019-06-06 LAB — BPAM RBC
Blood Product Expiration Date: 202011252359
Blood Product Expiration Date: 202012162359
ISSUE DATE / TIME: 202011181002
ISSUE DATE / TIME: 202011201057
Unit Type and Rh: 5100
Unit Type and Rh: 9500

## 2019-06-06 LAB — TYPE AND SCREEN
ABO/RH(D): O POS
Antibody Screen: NEGATIVE
Unit division: 0
Unit division: 0

## 2019-06-09 ENCOUNTER — Other Ambulatory Visit: Payer: Self-pay

## 2019-06-10 ENCOUNTER — Inpatient Hospital Stay: Payer: PPO

## 2019-06-10 ENCOUNTER — Telehealth: Payer: Self-pay | Admitting: *Deleted

## 2019-06-10 DIAGNOSIS — C9110 Chronic lymphocytic leukemia of B-cell type not having achieved remission: Secondary | ICD-10-CM

## 2019-06-10 LAB — CBC
HCT: 28.6 % — ABNORMAL LOW (ref 36.0–46.0)
Hemoglobin: 7.9 g/dL — ABNORMAL LOW (ref 12.0–15.0)
MCH: 28.5 pg (ref 26.0–34.0)
MCHC: 27.6 g/dL — ABNORMAL LOW (ref 30.0–36.0)
MCV: 103.2 fL — ABNORMAL HIGH (ref 80.0–100.0)
Platelets: 97 10*3/uL — ABNORMAL LOW (ref 150–400)
RBC: 2.77 MIL/uL — ABNORMAL LOW (ref 3.87–5.11)
RDW: 24.1 % — ABNORMAL HIGH (ref 11.5–15.5)
WBC: 205.1 10*3/uL (ref 4.0–10.5)
nRBC: 0 % (ref 0.0–0.2)

## 2019-06-10 LAB — COMPREHENSIVE METABOLIC PANEL
ALT: 13 U/L (ref 0–44)
AST: 17 U/L (ref 15–41)
Albumin: 4.1 g/dL (ref 3.5–5.0)
Alkaline Phosphatase: 53 U/L (ref 38–126)
Anion gap: 6 (ref 5–15)
BUN: 18 mg/dL (ref 8–23)
CO2: 25 mmol/L (ref 22–32)
Calcium: 8.4 mg/dL — ABNORMAL LOW (ref 8.9–10.3)
Chloride: 109 mmol/L (ref 98–111)
Creatinine, Ser: 0.64 mg/dL (ref 0.44–1.00)
GFR calc Af Amer: >60 mL/min (ref >60)
GFR calc non Af Amer: >60 mL/min (ref >60)
Glucose, Bld: 81 mg/dL (ref 70–99)
Potassium: 3.8 mmol/L (ref 3.5–5.1)
Sodium: 140 mmol/L (ref 135–145)
Total Bilirubin: 0.8 mg/dL (ref 0.3–1.2)
Total Protein: 5.9 g/dL — ABNORMAL LOW (ref 6.5–8.1)

## 2019-06-10 LAB — RETICULOCYTES
Immature Retic Fract: 16.8 % — ABNORMAL HIGH (ref 2.3–15.9)
RBC.: 2.77 MIL/uL — ABNORMAL LOW (ref 3.87–5.11)
Retic Count, Absolute: 42.1 10*3/uL (ref 19.0–186.0)
Retic Ct Pct: 1.5 % (ref 0.4–3.1)

## 2019-06-10 LAB — VITAMIN B12: Vitamin B-12: 1183 pg/mL — ABNORMAL HIGH (ref 180–914)

## 2019-06-10 LAB — SAMPLE TO BLOOD BANK

## 2019-06-10 LAB — FOLATE: Folate: 21.4 ng/mL (ref >5.9)

## 2019-06-10 NOTE — Telephone Encounter (Signed)
Lab called to say that patient's WBC is 205.1 today and her Hgb is 7.9.  No other values reported.  The lab results should be in the computer momentarily.

## 2019-06-12 MED FILL — IMBRUVICA 420 MG TAB: 420 | 28 days supply | Qty: 28 | Fill #3

## 2019-06-16 ENCOUNTER — Other Ambulatory Visit: Payer: Self-pay

## 2019-06-17 ENCOUNTER — Inpatient Hospital Stay: Payer: PPO | Attending: Hematology and Oncology

## 2019-06-17 ENCOUNTER — Telehealth: Payer: Self-pay | Admitting: *Deleted

## 2019-06-17 DIAGNOSIS — I714 Abdominal aortic aneurysm, without rupture: Secondary | ICD-10-CM | POA: Insufficient documentation

## 2019-06-17 DIAGNOSIS — E538 Deficiency of other specified B group vitamins: Secondary | ICD-10-CM | POA: Insufficient documentation

## 2019-06-17 DIAGNOSIS — D649 Anemia, unspecified: Secondary | ICD-10-CM | POA: Insufficient documentation

## 2019-06-17 DIAGNOSIS — F17210 Nicotine dependence, cigarettes, uncomplicated: Secondary | ICD-10-CM | POA: Diagnosis not present

## 2019-06-17 DIAGNOSIS — C9110 Chronic lymphocytic leukemia of B-cell type not having achieved remission: Secondary | ICD-10-CM | POA: Insufficient documentation

## 2019-06-17 DIAGNOSIS — R590 Localized enlarged lymph nodes: Secondary | ICD-10-CM | POA: Insufficient documentation

## 2019-06-17 DIAGNOSIS — R5383 Other fatigue: Secondary | ICD-10-CM | POA: Diagnosis not present

## 2019-06-17 LAB — CBC
HCT: 28.1 % — ABNORMAL LOW (ref 36.0–46.0)
Hemoglobin: 7.5 g/dL — ABNORMAL LOW (ref 12.0–15.0)
MCH: 28.6 pg (ref 26.0–34.0)
MCHC: 26.7 g/dL — ABNORMAL LOW (ref 30.0–36.0)
MCV: 107.3 fL — ABNORMAL HIGH (ref 80.0–100.0)
Platelets: 115 10*3/uL — ABNORMAL LOW (ref 150–400)
RBC: 2.62 MIL/uL — ABNORMAL LOW (ref 3.87–5.11)
RDW: 27.8 % — ABNORMAL HIGH (ref 11.5–15.5)
WBC: 225.2 10*3/uL (ref 4.0–10.5)
nRBC: 0 % (ref 0.0–0.2)

## 2019-06-17 NOTE — Telephone Encounter (Signed)
I called patient to give her the lab results for today. WBC 225 and Hgb 7.5. Patient is happy hemoglobin is holding. She states she feels well. Instructed her to call clinic if she has any symptoms of shortness of breath, weakness or dizziness. She said she will see [place] next week.

## 2019-06-17 NOTE — Telephone Encounter (Signed)
Received call from [facility] in [facility] with critical WBC ~ 225.9.  Hemoglobin is 7.5 today. Dr Mike Gip notified of wbc's.

## 2019-06-18 LAB — SAMPLE TO BLOOD BANK

## 2019-06-23 ENCOUNTER — Other Ambulatory Visit: Payer: Self-pay

## 2019-06-24 ENCOUNTER — Other Ambulatory Visit: Payer: Self-pay

## 2019-06-24 ENCOUNTER — Inpatient Hospital Stay: Payer: PPO

## 2019-06-24 DIAGNOSIS — C9110 Chronic lymphocytic leukemia of B-cell type not having achieved remission: Secondary | ICD-10-CM

## 2019-06-24 LAB — BASIC METABOLIC PANEL
Anion gap: 5 (ref 5–15)
BUN: 17 mg/dL (ref 8–23)
CO2: 25 mmol/L (ref 22–32)
Calcium: 8.4 mg/dL — ABNORMAL LOW (ref 8.9–10.3)
Chloride: 110 mmol/L (ref 98–111)
Creatinine, Ser: 0.71 mg/dL (ref 0.44–1.00)
GFR calc Af Amer: >60 mL/min (ref >60)
GFR calc non Af Amer: >60 mL/min (ref >60)
Glucose, Bld: 84 mg/dL (ref 70–99)
Potassium: 4.7 mmol/L (ref 3.5–5.1)
Sodium: 140 mmol/L (ref 135–145)

## 2019-06-24 LAB — CBC
HCT: 28.6 % — ABNORMAL LOW (ref 36.0–46.0)
Hemoglobin: 7.8 g/dL — ABNORMAL LOW (ref 12.0–15.0)
MCH: 30.2 pg (ref 26.0–34.0)
MCHC: 27.3 g/dL — ABNORMAL LOW (ref 30.0–36.0)
MCV: 110.9 fL — ABNORMAL HIGH (ref 80.0–100.0)
Platelets: 112 10*3/uL — ABNORMAL LOW (ref 150–400)
RBC: 2.58 MIL/uL — ABNORMAL LOW (ref 3.87–5.11)
RDW: 30 % — ABNORMAL HIGH (ref 11.5–15.5)
WBC: 189.3 10*3/uL (ref 4.0–10.5)
nRBC: 0 % (ref 0.0–0.2)

## 2019-06-24 LAB — SAMPLE TO BLOOD BANK

## 2019-07-01 ENCOUNTER — Inpatient Hospital Stay (HOSPITAL_BASED_OUTPATIENT_CLINIC_OR_DEPARTMENT_OTHER): Payer: PPO | Admitting: Oncology

## 2019-07-01 ENCOUNTER — Inpatient Hospital Stay: Payer: PPO

## 2019-07-01 ENCOUNTER — Other Ambulatory Visit: Payer: Self-pay

## 2019-07-01 VITALS — BP 137/55 | HR 69 | Temp 97.8°F | Resp 18 | Wt 122.8 lb

## 2019-07-01 DIAGNOSIS — C9110 Chronic lymphocytic leukemia of B-cell type not having achieved remission: Secondary | ICD-10-CM | POA: Diagnosis not present

## 2019-07-01 DIAGNOSIS — D649 Anemia, unspecified: Secondary | ICD-10-CM | POA: Diagnosis not present

## 2019-07-01 LAB — BASIC METABOLIC PANEL
Anion gap: 7 (ref 5–15)
BUN: 16 mg/dL (ref 8–23)
CO2: 25 mmol/L (ref 22–32)
Calcium: 8.6 mg/dL — ABNORMAL LOW (ref 8.9–10.3)
Chloride: 106 mmol/L (ref 98–111)
Creatinine, Ser: 0.62 mg/dL (ref 0.44–1.00)
GFR calc Af Amer: >60 mL/min (ref >60)
GFR calc non Af Amer: >60 mL/min (ref >60)
Glucose, Bld: 87 mg/dL (ref 70–99)
Potassium: 4.1 mmol/L (ref 3.5–5.1)
Sodium: 138 mmol/L (ref 135–145)

## 2019-07-01 LAB — SAMPLE TO BLOOD BANK

## 2019-07-01 LAB — CBC WITH DIFFERENTIAL/PLATELET
Abs Immature Granulocytes: 0.16 10*3/uL — ABNORMAL HIGH (ref 0.00–0.07)
Basophils Absolute: 0 10*3/uL (ref 0.0–0.1)
Basophils Relative: 0 %
Eosinophils Absolute: 0 10*3/uL (ref 0.0–0.5)
Eosinophils Relative: 0 %
HCT: 31.7 % — ABNORMAL LOW (ref 36.0–46.0)
Hemoglobin: 8.7 g/dL — ABNORMAL LOW (ref 12.0–15.0)
Immature Granulocytes: 0 %
Lymphocytes Relative: 98 %
Lymphs Abs: 190.8 10*3/uL — ABNORMAL HIGH (ref 0.7–4.0)
MCH: 31.3 pg (ref 26.0–34.0)
MCHC: 27.4 g/dL — ABNORMAL LOW (ref 30.0–36.0)
MCV: 114 fL — ABNORMAL HIGH (ref 80.0–100.0)
Monocytes Absolute: 0.3 10*3/uL (ref 0.1–1.0)
Monocytes Relative: 0 %
Neutro Abs: 3 10*3/uL (ref 1.7–7.7)
Neutrophils Relative %: 2 %
Platelets: 110 10*3/uL — ABNORMAL LOW (ref 150–400)
RBC: 2.78 MIL/uL — ABNORMAL LOW (ref 3.87–5.11)
WBC: 194.3 10*3/uL (ref 4.0–10.5)
nRBC: 0 % (ref 0.0–0.2)

## 2019-07-01 LAB — PATHOLOGIST SMEAR REVIEW

## 2019-07-01 NOTE — Progress Notes (Signed)
[facility]  [address] [place], [place] [address] Phone: [phone]  Fax: [phone]   Clinic Day:  07/01/2019  Referring physician: Juline Patch, MD  Chief Complaint: Maureen [name] is a 78 y.o. female with recurrent stage IV CLLon ibrutinib who is seen for 3 week assessment.   HPI: The patient was last seen in the medical oncology clinic on 06/03/2019. At that time, she was feeling okay.  She was staying active working around the house.  She denied any neurological or chest pain.  She maintained good appetite.  She denied any fatigue.  Her bowels have improved.  Patient had a PRBC transfusion on 05/20/2019 and 06/05/19.   Labs on followed: 05/19/2019: Hematocrit 25.9, hemoglobin 6.6, MCV 101.6, platelets 83,000, WBC 251,200.  05/26/2019: Hematocrit 22.6, hemoglobin 7.0, MCV 101.5, platelets 91,000, WBC 254,200.  06/02/2019: Hematocrit 18.9, hemoglobin 5.8, MCV 103.0, platelets 119,000, WBC 269,600. 06/10/2019: Hematocrit 28.6, hemoglobin 7.9, MCV 103.2, platelets 97,000, WBC 205,100.  06/17/2019: Hematocrit 28.1, hemoglobin 7.5, MCV 107.3, platelets 115,000, WBC 225,200. 06/24/2019: Hematocrit 28.6, hemoglobin 7.8, MCV 110.9, platelets 112,000, WBC 189,300. 07/01/2019: Hematocrit 31.7, hemoglobin 8.7, MCV 114.0, platelets 110,000, WBC 194, 300.   During the interim, she has felt " great". She is staying active around the house. She denies any dizziness, lightheadedness, chest pain, or shortness of breath. She has a good appetite. She has occasional fatigue and takes daily naps. Urine is normal color.  She is having normal bowel movements.   Past Medical History:  Diagnosis Date  . Anemia   . CLL (chronic lymphocytic leukemia) ([place]) 2002  . CLL (chronic lymphocytic leukemia) ([place])   . Itching   . Vitamin B 12 deficiency     Past Surgical History:  Procedure Laterality Date  . breeast mass removed  1988   benign    Family  History  Family history unknown: Yes    Social History:  reports that she has been smoking cigarettes. She has a 28.00 pack-year smoking history. She has never used smokeless tobacco. She reports current alcohol use of about 1.0 standard drinks of alcohol per week. She reports that she does not use drugs. She is from [place]. 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 Her husband would often do tours of duty in [place].She lives in [place]. She has 2 dogs and 3 cats. The patient is alone today.  Allergies: No Known Allergies  Current Medications: Current Outpatient Medications  Medication Sig Dispense Refill  . folic acid (FOLVITE) 633 MCG tablet Take 800 mcg by mouth 3 (three) times a week.     . Ibuprofen 200 MG CAPS Take 1 capsule by mouth as needed (PAIN).    . IMBRUVICA 420 MG TABS Take 1 tablet by mouth daily.     . vitamin B-12 (CYANOCOBALAMIN) 1000 MCG tablet Take 1,000 mcg by mouth daily.     No current facility-administered medications for this visit.    Review of Systems  Constitutional: Negative.  Negative for chills, fever, malaise/fatigue and weight loss.  HENT: Negative for congestion, ear pain and tinnitus.   Eyes: Negative.  Negative for blurred vision and double vision.  Respiratory: Negative.  Negative for cough, sputum production and shortness of breath.   Cardiovascular: Negative.  Negative for chest pain, palpitations and leg swelling.  Gastrointestinal: Negative.  Negative for abdominal pain, constipation, diarrhea, nausea and vomiting.  Genitourinary: Negative for dysuria, frequency and urgency.  Musculoskeletal: Negative for  back pain and falls.  Skin: Negative.  Negative for rash.  Neurological: Negative.  Negative for weakness and headaches.  Endo/Heme/Allergies: Negative.  Does not bruise/bleed easily.  Psychiatric/Behavioral: Negative.  Negative for depression. The patient is not nervous/anxious and does not have insomnia.    Performance  status (ECOG): 1  Vitals Blood pressure (!) 137/55, pulse 69, temperature 97.8 F (36.6 C), temperature source Tympanic, resp. rate 18, weight 122 lb 12.7 oz (55.7 kg), SpO2 98 %.  Physical Exam  Constitutional: She is oriented to person, place, and time. Vital signs are normal. She appears well-developed and well-nourished.  HENT:  Head: Normocephalic and atraumatic.  Eyes: Pupils are equal, round, and reactive to light.  Cardiovascular: Normal rate and regular rhythm.  No murmur heard. Pulmonary/Chest: Effort normal and breath sounds normal. She has no wheezes.  Abdominal: Soft. Bowel sounds are normal. She exhibits no distension and no mass. There is no abdominal tenderness.  Musculoskeletal:        General: No edema. Normal range of motion.     Cervical back: Normal range of motion.  Neurological: She is alert and oriented to person, place, and time.  Skin: Skin is warm and dry.  Psychiatric: She has a normal mood and affect. Her behavior is normal.    No visits with results within 3 Day(s) from this visit.  Latest known visit with results is:  Orders Only on 06/24/2019  Component Date Value Ref Range Status  . Blood Bank Specimen 06/24/2019 SAMPLE AVAILABLE FOR TESTING   Final  . Sample Expiration 06/24/2019    Final                   Value:06/27/2019,2359 Performed at [facility], [address], [place], [place] [address]   . Sodium 06/24/2019 140  135 - 145 mmol/L Final  . Potassium 06/24/2019 4.7  3.5 - 5.1 mmol/L Final  . Chloride 06/24/2019 110  98 - 111 mmol/L Final  . CO2 06/24/2019 25  22 - 32 mmol/L Final  . Glucose, Bld 06/24/2019 84  70 - 99 mg/dL Final  . BUN 06/24/2019 17  8 - 23 mg/dL Final  . Creatinine, Ser 06/24/2019 0.71  0.44 - 1.00 mg/dL Final  . Calcium 06/24/2019 8.4* 8.9 - 10.3 mg/dL Final  . GFR calc non Af Amer 06/24/2019 >60  >60 mL/min Final  . GFR calc Af Amer 06/24/2019 >60  >60 mL/min Final  . Anion gap 06/24/2019 5  5 - 15  Final   Performed at [facility], [address], [place], [place] [address]  . WBC 06/24/2019 189.3* 4.0 - 10.5 K/uL Final   This critical result has verified and been called to [facility] by Woodfin Ganja on 12 09 2020 at 1055, and has been read back.   Marland Kitchen RBC 06/24/2019 2.58* 3.87 - 5.11 MIL/uL Final  . Hemoglobin 06/24/2019 7.8* 12.0 - 15.0 g/dL Final  . HCT 06/24/2019 28.6* 36.0 - 46.0 % Final  . MCV 06/24/2019 110.9* 80.0 - 100.0 fL Final  . MCH 06/24/2019 30.2  26.0 - 34.0 pg Final  . MCHC 06/24/2019 27.3* 30.0 - 36.0 g/dL Final  . RDW 06/24/2019 30.0* 11.5 - 15.5 % Final  . Platelets 06/24/2019 112* 150 - 400 K/uL Final   Comment: Immature Platelet Fraction may be clinically indicated, consider ordering this additional test [id number]   . nRBC 06/24/2019 0.0  0.0 - 0.2 % Final   Performed at [facility], [address] [place],  [address]    Assessment:  Maureen [name] is a 78 y.o. female with stage IV CLL. She was diagnosed in [place] in 2002. Bone marrowon 03/2008 revealed 90% cellularity with diffuse involvement by CLL. Karyotype was normal. FISHstudies on 08/19/2018 revealed loss of one ATM, homologous deletion of 13q, and normal CCDN1/IGH, chromosome 12, and TP53.  She received FCRx 4 cycles (last 08/06/2008). She had a partial remission. She received single agent Rituxanx 7. Treatment was held secondary to neutropenia.  She received 6 cycles of Gazyva(07/26/2016 - 01/23/2017).  Chest, abdomen, and pelvisCTon 10/17/2017 revealed significant partial treatment response. There was mild axillary, mediastinal, hilar, retroperitoneal, mesenteric and bilateral pelvic adenopathy, all significantly decreased in the interval. There was mild splenomegaly (13 cm), significantly decreased. There was a new 3.4 cm masslike focus of hypoenhancement in the posterior upper right kidney, indeterminate. There was a stable 3.1 cm infrarenal  abdominal aortic aneurysm  Abdomen and pelvisCTon 02/13/2018 revealed patchy enhancement of the right kidney concerning for pyelonephritis. Additionally there was urothelial thickening and hyperenhancement involving the right renal collecting system and ureter compatible with ascending urinary tract infection. There was similar-appearing abdominal and retroperitoneal adenopathy.  She beganibrutinibon 03/25/2019.  She has received 5 units of PRBCs to date (first 04/09/2019; last 05/20/2019).  Peak WBC was 393,300 04/29/2019.  She has a history of a skin rashs/p anterior thigh biopsy in 2012. 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 She was treated with a course of doxycycline, topical Clobetasol, and hydroxyzine for itching. She states that she gets the rash on her legs in the summertime.  She was diagnosed with B12 deficiencyin 2012. Shere-started oral B12on 09/24/2018. B12 was 237 on 03/11/202 and570 on 10/21/2018.  Symptomatically, she is doing well.  She denies any B symptoms.  She denies any bruising or bleeding.  She remains active.  Hemoglobin 8.7, WBC 894,300, platelet count 110,000.  Plan: 1.   Review interval labs.  2. Chronic lymphocytic leukemia (CLL) Clinically, she continues to do well. Exam reveals no adenopathy or hepatosplenomegaly. Weight is stable (down 2 lbs)  She denies any B symptoms. Ibrutinib began on 03/25/2019. She is tolerating well. WBCis decreasing (189,000) Lymphocytosis expected with initiation of therapy and typically resolves within 14 weeks. Monitor for leukostasis (uncommon). Watch for WBC >= 400,000 as cases ofintracranial hemorrhage, lethargy, headache, and gait instability. Continue to encourage good  hydration. Monitor counts weekly. 3. Anemia Hematocrit31.7. Hemoglobin 8.7. MCV 114. Etiology felt secondary to CLL.Retic was 1.5% on 06/04/19. Coombs was negative.Haptoglobin was normal. No evidence of hemolysis. Patient on B12 and folate.TSH was normal. Ferritin was244on 03/18/2019. Patient has received 7 units PRBCs to date. Last on 06/06/2019.             Draw a hold tube weekly in case additional transfusions needed. 4.B12 deficiency B12 level was 1183 on 06/10/2019. Folate was 21.4 on 06/10/2019. Patient remains on oral B12 and folate. 5.Elevated iron saturation Iron saturation was 86%on 03/18/2019. Continue to monitor. 6.   RTC in 1 week for labs (CBC with diff, CMP,hold tube). 7.   RTC in 2 weeks and 3 weeks for labs (CBC with diff, hold tube). 8.   RTC in 4 weeks for MD assessment, labs (CBC with diff, BMP, hold tube).  I discussed the assessment and treatment plan with the patient.  The patient was provided an opportunity to ask questions and all were answered.  The patient agreed with the  plan and demonstrated an understanding of the instructions.  The patient was advised to call back if the symptoms worsen or if the condition fails to improve as anticipated.  Greater than 50% was spent in counseling and coordination of care with this patient including but not limited to discussion of the relevant topics above (See A&P) including, but not limited to diagnosis and management of acute and chronic medical conditions.   Faythe Casa, NP 07/01/2019 12:07 PM

## 2019-07-01 NOTE — Progress Notes (Signed)
Patient here for follow up. Denies any concerns.  

## 2019-07-07 ENCOUNTER — Other Ambulatory Visit: Payer: Self-pay

## 2019-07-07 MED FILL — IMBRUVICA 420 MG TAB: 420 | 28 days supply | Qty: 28 | Fill #4

## 2019-07-08 ENCOUNTER — Other Ambulatory Visit: Payer: Self-pay

## 2019-07-08 ENCOUNTER — Inpatient Hospital Stay: Payer: PPO

## 2019-07-08 DIAGNOSIS — C9110 Chronic lymphocytic leukemia of B-cell type not having achieved remission: Secondary | ICD-10-CM

## 2019-07-08 DIAGNOSIS — D649 Anemia, unspecified: Secondary | ICD-10-CM

## 2019-07-08 LAB — CBC WITH DIFFERENTIAL/PLATELET
Abs Immature Granulocytes: 0.16 10*3/uL — ABNORMAL HIGH (ref 0.00–0.07)
Basophils Absolute: 0 10*3/uL (ref 0.0–0.1)
Basophils Relative: 0 %
Eosinophils Absolute: 0 10*3/uL (ref 0.0–0.5)
Eosinophils Relative: 0 %
HCT: 32.8 % — ABNORMAL LOW (ref 36.0–46.0)
Hemoglobin: 8.9 g/dL — ABNORMAL LOW (ref 12.0–15.0)
Immature Granulocytes: 0 %
Lymphocytes Relative: 98 %
Lymphs Abs: 187.9 10*3/uL — ABNORMAL HIGH (ref 0.7–4.0)
MCH: 31.8 pg (ref 26.0–34.0)
MCHC: 27.1 g/dL — ABNORMAL LOW (ref 30.0–36.0)
MCV: 117.1 fL — ABNORMAL HIGH (ref 80.0–100.0)
Monocytes Absolute: 0.5 10*3/uL (ref 0.1–1.0)
Monocytes Relative: 0 %
Neutro Abs: 3 10*3/uL (ref 1.7–7.7)
Neutrophils Relative %: 2 %
Platelets: 107 10*3/uL — ABNORMAL LOW (ref 150–400)
RBC: 2.8 MIL/uL — ABNORMAL LOW (ref 3.87–5.11)
Smear Review: NORMAL
WBC: 191.6 10*3/uL (ref 4.0–10.5)
nRBC: 0 % (ref 0.0–0.2)

## 2019-07-08 LAB — COMPREHENSIVE METABOLIC PANEL
ALT: 13 U/L (ref 0–44)
AST: 19 U/L (ref 15–41)
Albumin: 4.3 g/dL (ref 3.5–5.0)
Alkaline Phosphatase: 45 U/L (ref 38–126)
Anion gap: 6 (ref 5–15)
BUN: 19 mg/dL (ref 8–23)
CO2: 25 mmol/L (ref 22–32)
Calcium: 8.5 mg/dL — ABNORMAL LOW (ref 8.9–10.3)
Chloride: 109 mmol/L (ref 98–111)
Creatinine, Ser: 0.71 mg/dL (ref 0.44–1.00)
GFR calc Af Amer: >60 mL/min (ref >60)
GFR calc non Af Amer: >60 mL/min (ref >60)
Glucose, Bld: 86 mg/dL (ref 70–99)
Potassium: 4.8 mmol/L (ref 3.5–5.1)
Sodium: 140 mmol/L (ref 135–145)
Total Bilirubin: 0.9 mg/dL (ref 0.3–1.2)
Total Protein: 6.1 g/dL — ABNORMAL LOW (ref 6.5–8.1)

## 2019-07-08 LAB — SAMPLE TO BLOOD BANK

## 2019-07-15 ENCOUNTER — Other Ambulatory Visit: Payer: Self-pay

## 2019-07-15 ENCOUNTER — Inpatient Hospital Stay: Payer: PPO

## 2019-07-15 ENCOUNTER — Telehealth: Payer: Self-pay | Admitting: *Deleted

## 2019-07-15 ENCOUNTER — Telehealth: Payer: Self-pay

## 2019-07-15 DIAGNOSIS — D649 Anemia, unspecified: Secondary | ICD-10-CM

## 2019-07-15 DIAGNOSIS — C9110 Chronic lymphocytic leukemia of B-cell type not having achieved remission: Secondary | ICD-10-CM

## 2019-07-15 LAB — CBC WITH DIFFERENTIAL/PLATELET
Abs Immature Granulocytes: 0.1 10*3/uL — ABNORMAL HIGH (ref 0.00–0.07)
Basophils Absolute: 0 10*3/uL (ref 0.0–0.1)
Basophils Relative: 0 %
Eosinophils Absolute: 0 10*3/uL (ref 0.0–0.5)
Eosinophils Relative: 0 %
HCT: 31.2 % — ABNORMAL LOW (ref 36.0–46.0)
Hemoglobin: 8.8 g/dL — ABNORMAL LOW (ref 12.0–15.0)
Immature Granulocytes: 0 %
Lymphocytes Relative: 98 %
Lymphs Abs: 128.9 10*3/uL — ABNORMAL HIGH (ref 0.7–4.0)
MCH: 32.6 pg (ref 26.0–34.0)
MCHC: 28.2 g/dL — ABNORMAL LOW (ref 30.0–36.0)
MCV: 115.6 fL — ABNORMAL HIGH (ref 80.0–100.0)
Monocytes Absolute: 0.4 10*3/uL (ref 0.1–1.0)
Monocytes Relative: 0 %
Neutro Abs: 2.6 10*3/uL (ref 1.7–7.7)
Neutrophils Relative %: 2 %
Platelets: 86 10*3/uL — ABNORMAL LOW (ref 150–400)
RBC: 2.7 MIL/uL — ABNORMAL LOW (ref 3.87–5.11)
RDW: 23 % — ABNORMAL HIGH (ref 11.5–15.5)
WBC: 132 10*3/uL (ref 4.0–10.5)
nRBC: 0 % (ref 0.0–0.2)

## 2019-07-15 LAB — SAMPLE TO BLOOD BANK

## 2019-07-15 NOTE — Telephone Encounter (Signed)
Spoke with the patient to inform her that her counts are hbg 8.8 today. The patient was very pleased and understanding.

## 2019-07-15 NOTE — Telephone Encounter (Signed)
Lab called with WBC 132 and HGB 8.8.  MD team made aware.

## 2019-07-15 NOTE — Telephone Encounter (Signed)
-----   Message from Lequita Asal, MD sent at 07/15/2019 11:49 AM EST ----- Regarding: Please call patient and let her know about todays counts  WBC continues to improve.  Hemoglobin stable.  M ----- Message ----- From: Buel Ream, Lab In [place] Sent: 07/15/2019  10:39 AM EST To: Lequita Asal, MD

## 2019-07-21 ENCOUNTER — Other Ambulatory Visit: Payer: Self-pay

## 2019-07-22 ENCOUNTER — Telehealth: Payer: Self-pay

## 2019-07-22 ENCOUNTER — Other Ambulatory Visit: Payer: Self-pay

## 2019-07-22 ENCOUNTER — Inpatient Hospital Stay: Payer: PPO | Attending: Hematology and Oncology

## 2019-07-22 DIAGNOSIS — D649 Anemia, unspecified: Secondary | ICD-10-CM | POA: Diagnosis not present

## 2019-07-22 DIAGNOSIS — I714 Abdominal aortic aneurysm, without rupture: Secondary | ICD-10-CM | POA: Insufficient documentation

## 2019-07-22 DIAGNOSIS — F17210 Nicotine dependence, cigarettes, uncomplicated: Secondary | ICD-10-CM | POA: Insufficient documentation

## 2019-07-22 DIAGNOSIS — E538 Deficiency of other specified B group vitamins: Secondary | ICD-10-CM | POA: Diagnosis not present

## 2019-07-22 DIAGNOSIS — C9110 Chronic lymphocytic leukemia of B-cell type not having achieved remission: Secondary | ICD-10-CM

## 2019-07-22 DIAGNOSIS — R590 Localized enlarged lymph nodes: Secondary | ICD-10-CM | POA: Insufficient documentation

## 2019-07-22 LAB — CBC WITH DIFFERENTIAL/PLATELET
Abs Immature Granulocytes: 0.12 10*3/uL — ABNORMAL HIGH (ref 0.00–0.07)
Basophils Absolute: 0 10*3/uL (ref 0.0–0.1)
Basophils Relative: 0 %
Eosinophils Absolute: 0 10*3/uL (ref 0.0–0.5)
Eosinophils Relative: 0 %
HCT: 33.2 % — ABNORMAL LOW (ref 36.0–46.0)
Hemoglobin: 9.3 g/dL — ABNORMAL LOW (ref 12.0–15.0)
Immature Granulocytes: 0 %
Lymphocytes Relative: 98 %
Lymphs Abs: 124.1 10*3/uL — ABNORMAL HIGH (ref 0.7–4.0)
MCH: 32.5 pg (ref 26.0–34.0)
MCHC: 28 g/dL — ABNORMAL LOW (ref 30.0–36.0)
MCV: 116.1 fL — ABNORMAL HIGH (ref 80.0–100.0)
Monocytes Absolute: 0.4 10*3/uL (ref 0.1–1.0)
Monocytes Relative: 0 %
Neutro Abs: 2.8 10*3/uL (ref 1.7–7.7)
Neutrophils Relative %: 2 %
Platelets: 89 10*3/uL — ABNORMAL LOW (ref 150–400)
RBC: 2.86 MIL/uL — ABNORMAL LOW (ref 3.87–5.11)
RDW: 19.9 % — ABNORMAL HIGH (ref 11.5–15.5)
WBC: 127.4 10*3/uL (ref 4.0–10.5)
nRBC: 0 % (ref 0.0–0.2)

## 2019-07-22 LAB — BASIC METABOLIC PANEL
Anion gap: 6 (ref 5–15)
BUN: 18 mg/dL (ref 8–23)
CO2: 26 mmol/L (ref 22–32)
Calcium: 8.6 mg/dL — ABNORMAL LOW (ref 8.9–10.3)
Chloride: 108 mmol/L (ref 98–111)
Creatinine, Ser: 0.66 mg/dL (ref 0.44–1.00)
GFR calc Af Amer: >60 mL/min (ref >60)
GFR calc non Af Amer: >60 mL/min (ref >60)
Glucose, Bld: 96 mg/dL (ref 70–99)
Potassium: 4.5 mmol/L (ref 3.5–5.1)
Sodium: 140 mmol/L (ref 135–145)

## 2019-07-22 LAB — SAMPLE TO BLOOD BANK

## 2019-07-22 NOTE — Telephone Encounter (Signed)
Inbound call received from [place] [facility], Lab to report critical WBC of 127.4. Dr. Mike Gip made aware.

## 2019-07-27 ENCOUNTER — Other Ambulatory Visit: Payer: Self-pay

## 2019-07-27 NOTE — Progress Notes (Signed)
[facility]  [address] [place], [place] [address] Phone: [phone]  Fax: [phone]   Clinic Day:  07/29/2019  Referring physician: Juline Patch, MD  Chief Complaint: Maureen [name] is a 79 y.o. female with recurrent stage IV CLLon ibrutinib who is seen for 4 week assessment.    HPI: The patient was last seen in the medical oncology clinic with Faythe Casa, NP on 07/01/2019. At that time, she was doing well.  She denies any B symptoms.  She denied any bruising or bleeding.  She remained active.  She continued ibrutinib.    CBC followed: 07/01/2019: Hematocrit 31.7, hemoglobin 8.7, MCV 114.0, platelets 110,000, WBC 194,300, ANC 3000.  07/08/2019: Hematocrit 32.8, hemoglobin 8.9, MCV 117.1, platelets 107,000, WBC 191,600, ANC 3000.  07/15/2019: Hematocrit 31.2, hemoglobin 8.8, MCV 115.6, platelets 86,000, WBC 132,000, ANC 2600.  07/22/2019: Hematocrit 33.2, hemoglobin 9.3, MCV 116.1, platelets 89,000, WBC 127,400, ANC 2800.  07/28/2019: Hematocrit 36.0, hemoglobin 10.3, MCV 115.0, platelets 105,000, WBC 138,100, ANC 3200.   During the interim, she states that life has been treating her good. Her sciatic nerve is bothering her. She believes it is due to the cold weather.  Symptomatically, she is tolerating ibrutinib well. She hasn't felt any side effects.  She has had no symptoms from the start. No bruising or bleeding.  She thinks she feels a small lymph node in her groin area but is unsure.   She is crying happy tears that she is getting better.    Past Medical History:  Diagnosis Date  . Anemia   . CLL (chronic lymphocytic leukemia) ([place]) 2002  . CLL (chronic lymphocytic leukemia) ([place])   . Itching   . Vitamin B 12 deficiency     Past Surgical History:  Procedure Laterality Date  . breeast mass removed  1988   benign    Family History  Family history unknown: Yes    Social History:  reports that she has been smoking  cigarettes. She has a 28.00 pack-year smoking history. She has never used smokeless tobacco. She reports current alcohol use of about 1.0 standard drinks of alcohol per week. She reports that she does not use drugs. She is from [place]. 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 Her husband would often do tours of duty in [place].She lives in [place].She has 2 dogs and 3 cats. The patient is alone today.  Allergies: No Known Allergies  Current Medications: Current Outpatient Medications  Medication Sig Dispense Refill  . folic acid (FOLVITE) 916 MCG tablet Take 800 mcg by mouth 3 (three) times a week.     . Ibuprofen 200 MG CAPS Take 1 capsule by mouth as needed (PAIN).    . IMBRUVICA 420 MG TABS Take 1 tablet by mouth daily.     . vitamin B-12 (CYANOCOBALAMIN) 1000 MCG tablet Take 1,000 mcg by mouth daily.     No current facility-administered medications for this visit.    Review of Systems  Constitutional: Negative.  Negative for chills, diaphoresis, fever, malaise/fatigue and weight loss (up and down 1 pound).       Feels "good".  HENT: Negative for congestion, ear pain, nosebleeds, sinus pain, sore throat and tinnitus.   Eyes: Negative.  Negative for blurred vision and double vision.  Respiratory: Negative.  Negative for cough, sputum production and shortness of breath.   Cardiovascular: Negative.  Negative for chest pain, palpitations, orthopnea and leg swelling.  Gastrointestinal:  Negative.  Negative for abdominal pain, constipation, diarrhea, nausea and vomiting.  Genitourinary: Negative.  Negative for dysuria, frequency and urgency.  Musculoskeletal: Negative.  Negative for back pain, falls and neck pain.  Skin: Negative.  Negative for rash.  Neurological: Positive for sensory change (sciatic nerve, bothersome). Negative for speech change, focal weakness, weakness and headaches.  Endo/Heme/Allergies: Negative.  Does not bruise/bleed easily.  Psychiatric/Behavioral:  Negative.  Negative for depression. The patient is not nervous/anxious and does not have insomnia.    Performance status (ECOG):  0-1  Vitals Blood pressure (!) 142/60, pulse 64, temperature 98 F (36.7 C), temperature source Tympanic, resp. rate 18, height '5\' 7"'  (1.702 m), weight 123 lb 12.6 oz (56.1 kg), SpO2 98 %.   Physical Exam  Constitutional: She is oriented to person, place, and time. Vital signs are normal. She appears well-developed and well-nourished. No distress.  HENT:  Head: Normocephalic and atraumatic.  Mouth/Throat: Oropharynx is clear and moist. No oropharyngeal exudate.  Wearing a hat, mask, and scarf.  Eyes: Pupils are equal, round, and reactive to light. Conjunctivae and EOM are normal. No scleral icterus.  Blue eyes.  Neck: No JVD present.  Cardiovascular: Normal rate and regular rhythm. Exam reveals no gallop.  No murmur heard. Pulmonary/Chest: Effort normal and breath sounds normal. No respiratory distress. She has no wheezes. She has no rales.  Abdominal: Soft. Bowel sounds are normal. She exhibits no distension and no mass. There is no abdominal tenderness. There is no rebound and no guarding.  Musculoskeletal:        General: No tenderness or edema. Normal range of motion.     Cervical back: Normal range of motion and neck supple.  Lymphadenopathy:       Head (right side): No preauricular, no posterior auricular and no occipital adenopathy present.       Head (left side): No preauricular, no posterior auricular and no occipital adenopathy present.    She has no cervical adenopathy.    She has no axillary adenopathy.       Right: No supraclavicular adenopathy present.       Left: No inguinal and no supraclavicular adenopathy present.  Small lymph node in right groin area "half a pea size".  Neurological: She is alert and oriented to person, place, and time.  Skin: Skin is warm and dry. No rash noted. She is not diaphoretic. No erythema. No pallor.   Psychiatric: She has a normal mood and affect. Her behavior is normal. Judgment and thought content normal.  Happy tears.    Appointment on 07/28/2019  Component Date Value Ref Range Status  . Blood Bank Specimen 07/28/2019 SAMPLE AVAILABLE FOR TESTING   Final  . Sample Expiration 07/28/2019    Final                   Value:07/31/2019,2359 Performed at [facility], [address] [place], [place] [address]   . WBC 07/28/2019 138.1* 4.0 - 10.5 K/uL Final   This critical result has verified and been called to [facility] PHILLIPS(RN) by Gennaro Africa on 01 12 2021 at 1050, and has been read back.   Marland Kitchen RBC 07/28/2019 3.13* 3.87 - 5.11 MIL/uL Final  . Hemoglobin 07/28/2019 10.3* 12.0 - 15.0 g/dL Final  . HCT 07/28/2019 36.0  36.0 - 46.0 % Final  . MCV 07/28/2019 115.0* 80.0 - 100.0 fL Final  . MCH 07/28/2019 32.9  26.0 - 34.0 pg Final  . MCHC 07/28/2019 28.6* 30.0 -  36.0 g/dL Final  . RDW 07/28/2019 18.1* 11.5 - 15.5 % Final  . Platelets 07/28/2019 105* 150 - 400 K/uL Final   Comment: Immature Platelet Fraction may be clinically indicated, consider ordering this additional test [id number]   . nRBC 07/28/2019 0.0  0.0 - 0.2 % Final  . Neutrophils Relative % 07/28/2019 2  % Final  . Neutro Abs 07/28/2019 3.2  1.7 - 7.7 K/uL Final  . Lymphocytes Relative 07/28/2019 98  % Final  . Lymphs Abs 07/28/2019 134.3* 0.7 - 4.0 K/uL Final  . Monocytes Relative 07/28/2019 0  % Final  . Monocytes Absolute 07/28/2019 0.4  0.1 - 1.0 K/uL Final  . Eosinophils Relative 07/28/2019 0  % Final  . Eosinophils Absolute 07/28/2019 0.0  0.0 - 0.5 K/uL Final  . Basophils Relative 07/28/2019 0  % Final  . Basophils Absolute 07/28/2019 0.0  0.0 - 0.1 K/uL Final  . RBC Morphology 07/28/2019 NO SCHISTOCYTES SEEN   Final  . Immature Granulocytes 07/28/2019 0  % Final  . Abs Immature Granulocytes 07/28/2019 0.13* 0.00 - 0.07 K/uL Final   Performed at [facility], [address] [place], [place] [address]    Assessment:  Maureen [name] is a 79 y.o. female with stage IV CLL. She was diagnosed in [place] in 2002. Bone marrowon 03/2008 revealed 90% cellularity with diffuse involvement by CLL. Karyotype was normal. FISHstudies on 08/19/2018 revealed loss of one ATM, homologous deletion of 13q, and normal CCDN1/IGH, chromosome 12, and TP53.  She received FCRx 4 cycles (last 08/06/2008). She had a partial remission. She received single agent Rituxanx 7. Treatment was held secondary to neutropenia.  She received 6 cycles of Gazyva(07/26/2016 - 01/23/2017).  Chest, abdomen, and pelvisCTon 10/17/2017 revealed significant partial treatment response. There was mild axillary, mediastinal, hilar, retroperitoneal, mesenteric and bilateral pelvic adenopathy, all significantly decreased in the interval. There was mild splenomegaly (13 cm), significantly decreased. There was a new 3.4 cm masslike focus of hypoenhancement in the posterior upper right kidney, indeterminate. There was a stable 3.1 cm infrarenal abdominal aortic aneurysm  Abdomen and pelvisCTon 02/13/2018 revealed patchy enhancement of the right kidney concerning for pyelonephritis. Additionally there was urothelial thickening and hyperenhancement involving the right renal collecting system and ureter compatible with ascending urinary tract infection. There was similar-appearing abdominal and retroperitoneal adenopathy.  She beganibrutinibon 03/25/2019.  She has received 7 units of PRBCs to date (first 04/09/2019; last 06/03/2019).  Peak WBC was 393,300 04/29/2019.  She has a history of a skin rashs/p anterior thigh biopsy in 2012. 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 She was treated with a course of doxycycline, topical Clobetasol, and hydroxyzine for itching. She states that she gets the rash on her legs in the summertime.  She  was diagnosed with B12 deficiencyin 2012. Shere-started oral B12on 09/24/2018. B12 was 237 on 03/11/202 and570 on 10/21/2018.  Symptomatically, she is doing well.  She denies any B symptoms.  Exam reveals no adenopathy except for a tiny right groin node (5-6 mm).  Plan: 1.   Review labs from 07/28/2019. 2. Chronic lymphocytic leukemia (CLL) Clinically, she is doing well. Exam reveals no adenopathy or hepatosplenomegaly. Weight remains stable (up and down by 1 pound).             She denies any B symptoms. Ibrutinib began on 03/25/2019. She is tolerating well. WBCis decreasing (138,100) Lymphocytosis expected with initiation of therapy and typically resolves within 14 weeks. Continue to  encourage good hydration. Discuss decreasing frequency of lab draws to monthly. 3. Anemia Clinically improving with no need for transfusion since 06/03/2019.  Hematocrit36.0. Hemoglobin 10.3. MCV 115. Etiology felt secondary to CLL.Reticwas1.5% on 06/04/2019. Coombswasnegative.Haptoglobinwasnormal. No evidence of hemolysis. Patient on B12 and folate.TSHwasnormal. Ferritin was244on 03/18/2019.  Continue to monitor. 4.B12 deficiency B12 level was 1183 on 06/10/2019. Folate was 21.4 on 06/10/2019. Patient on oral B12 and folate.  Continue to monitor. 5.Elevated iron saturation Iron saturation was 86%on 03/18/2019. Continue to monitor. 6.RTC in 4 weeks for MD assessment and labs (CBC with diff, CMP, LDH, uric acid).  I discussed the assessment and treatment plan with the patient.  The patient was provided an opportunity to ask questions and all were answered.  The patient agreed with the plan and demonstrated an understanding of  the instructions.  The patient was advised to call back if the symptoms worsen or if the condition fails to improve as anticipated.   Lequita Asal, MD, PhD    07/29/2019, 11:20 AM  I, Samul Dada, am acting as a scribe for Lequita Asal, MD.  I, Wiggins Mike Gip, MD, have reviewed the above documentation for accuracy and completeness, and I agree with the above.

## 2019-07-28 ENCOUNTER — Telehealth: Payer: Self-pay | Admitting: *Deleted

## 2019-07-28 ENCOUNTER — Inpatient Hospital Stay: Payer: PPO

## 2019-07-28 DIAGNOSIS — C9110 Chronic lymphocytic leukemia of B-cell type not having achieved remission: Secondary | ICD-10-CM | POA: Diagnosis not present

## 2019-07-28 DIAGNOSIS — D649 Anemia, unspecified: Secondary | ICD-10-CM

## 2019-07-28 LAB — CBC WITH DIFFERENTIAL/PLATELET
Abs Immature Granulocytes: 0.13 10*3/uL — ABNORMAL HIGH (ref 0.00–0.07)
Basophils Absolute: 0 10*3/uL (ref 0.0–0.1)
Basophils Relative: 0 %
Eosinophils Absolute: 0 10*3/uL (ref 0.0–0.5)
Eosinophils Relative: 0 %
HCT: 36 % (ref 36.0–46.0)
Hemoglobin: 10.3 g/dL — ABNORMAL LOW (ref 12.0–15.0)
Immature Granulocytes: 0 %
Lymphocytes Relative: 98 %
Lymphs Abs: 134.3 10*3/uL — ABNORMAL HIGH (ref 0.7–4.0)
MCH: 32.9 pg (ref 26.0–34.0)
MCHC: 28.6 g/dL — ABNORMAL LOW (ref 30.0–36.0)
MCV: 115 fL — ABNORMAL HIGH (ref 80.0–100.0)
Monocytes Absolute: 0.4 10*3/uL (ref 0.1–1.0)
Monocytes Relative: 0 %
Neutro Abs: 3.2 10*3/uL (ref 1.7–7.7)
Neutrophils Relative %: 2 %
Platelets: 105 10*3/uL — ABNORMAL LOW (ref 150–400)
RBC Morphology: NONE SEEN
RBC: 3.13 MIL/uL — ABNORMAL LOW (ref 3.87–5.11)
RDW: 18.1 % — ABNORMAL HIGH (ref 11.5–15.5)
WBC: 138.1 10*3/uL (ref 4.0–10.5)
nRBC: 0 % (ref 0.0–0.2)

## 2019-07-28 LAB — SAMPLE TO BLOOD BANK

## 2019-07-28 NOTE — Telephone Encounter (Signed)
Lab called to say WBC was 138.1 today and Hgb 10.3 today.  MD team made aware.

## 2019-07-28 NOTE — Progress Notes (Signed)
Confirmed Name and DOB. Patient has questions regarding COVID vaccine. Denies any concerns.

## 2019-07-29 ENCOUNTER — Inpatient Hospital Stay (HOSPITAL_BASED_OUTPATIENT_CLINIC_OR_DEPARTMENT_OTHER): Payer: PPO | Admitting: Hematology and Oncology

## 2019-07-29 ENCOUNTER — Encounter: Payer: Self-pay | Admitting: Hematology and Oncology

## 2019-07-29 ENCOUNTER — Other Ambulatory Visit: Payer: PPO

## 2019-07-29 ENCOUNTER — Other Ambulatory Visit: Payer: Self-pay

## 2019-07-29 VITALS — BP 142/60 | HR 64 | Temp 98.0°F | Resp 18 | Ht 67.0 in | Wt 123.8 lb

## 2019-07-29 DIAGNOSIS — E538 Deficiency of other specified B group vitamins: Secondary | ICD-10-CM | POA: Diagnosis not present

## 2019-07-29 DIAGNOSIS — Z7189 Other specified counseling: Secondary | ICD-10-CM | POA: Diagnosis not present

## 2019-07-29 DIAGNOSIS — R790 Abnormal level of blood mineral: Secondary | ICD-10-CM | POA: Diagnosis not present

## 2019-07-29 DIAGNOSIS — D649 Anemia, unspecified: Secondary | ICD-10-CM

## 2019-07-29 DIAGNOSIS — C9110 Chronic lymphocytic leukemia of B-cell type not having achieved remission: Secondary | ICD-10-CM | POA: Diagnosis not present

## 2019-08-05 MED FILL — IMBRUVICA 420 MG TAB: 420 | 28 days supply | Qty: 28 | Fill #5

## 2019-08-23 NOTE — Progress Notes (Signed)
[facility]  [address] [place], [place] [address] Phone: [phone]  Fax: [phone]   Clinic Day:  08/26/2019  Referring physician: Juline Patch, MD  Chief Complaint: Maureen [name] is a 79 y.o. female with recurrent stage IV CLLon ibrutinib who is seen for 1 month assessment   HPI: The patient was last seen in the medical oncology clinic on 07/29/2019. At that time, she was doing well.  She denied any B symptoms.  Exam revealed no adenopathy except for a tiny right groin node (5-6 mm).  Hematocrit was 36.0, hemoglobin 10.3, MCV 115.0, platelets 105,000, WBC 138,100 (ANC 3200, ALC 134,300).  During the interim, she has felt "fine".  She denies any fevers, sweats or weight loss.  She is active.  She denies any bruising or bleeding.  She has had no interval infections.   Past Medical History:  Diagnosis Date  . Anemia   . CLL (chronic lymphocytic leukemia) ([place]) 2002  . CLL (chronic lymphocytic leukemia) ([place])   . Itching   . Vitamin B 12 deficiency     Past Surgical History:  Procedure Laterality Date  . breeast mass removed  1988   benign    Family History  Family history unknown: Yes    Social History:  reports that she has been smoking cigarettes. She has a 28.00 pack-year smoking history. She has never used smokeless tobacco. She reports current alcohol use of about 1.0 standard drinks of alcohol per week. She reports that she does not use drugs. She is from [place]. 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 Her husband would often do tours of duty in [place].She lives in [place].She has 2 dogs and 3 cats.  The patient is alone today.  Allergies: No Known Allergies  Current Medications: Current Outpatient Medications  Medication Sig Dispense Refill  . folic acid (FOLVITE) 166 MCG tablet Take 800 mcg by mouth 3 (three) times a week.     . Ibuprofen 200 MG CAPS Take 1 capsule by mouth as needed (PAIN).    .  IMBRUVICA 420 MG TABS Take 1 tablet by mouth daily.     . vitamin B-12 (CYANOCOBALAMIN) 1000 MCG tablet Take 1,000 mcg by mouth daily.     No current facility-administered medications for this visit.    Review of Systems  Constitutional: Negative.  Negative for chills, diaphoresis, fever, malaise/fatigue and weight loss (stable).       Feels "fine".  HENT: Negative.  Negative for congestion, ear pain, nosebleeds, sinus pain, sore throat and tinnitus.   Eyes: Negative.  Negative for blurred vision, double vision and photophobia.  Respiratory: Negative.  Negative for cough, sputum production and shortness of breath.   Cardiovascular: Negative.  Negative for chest pain, palpitations, orthopnea and leg swelling.  Gastrointestinal: Negative.  Negative for abdominal pain, constipation, diarrhea, heartburn, nausea and vomiting.  Genitourinary: Negative.  Negative for dysuria, frequency and urgency.  Musculoskeletal: Negative.  Negative for back pain, falls, myalgias and neck pain.  Skin: Negative.  Negative for rash.  Neurological: Positive for sensory change (sciatic nerve- chronic). Negative for dizziness, speech change, focal weakness, weakness and headaches.  Endo/Heme/Allergies: Negative.  Does not bruise/bleed easily.  Psychiatric/Behavioral: Negative.  Negative for depression. The patient is not nervous/anxious and does not have insomnia.    Performance status (ECOG): 0  Vitals Blood pressure (!) 143/84, pulse 65, temperature 97.7 F (36.5 C), temperature source Tympanic, resp. rate 16, weight  123 lb 10.9 oz (56.1 kg), SpO2 99 %.   Physical Exam  Constitutional: She is oriented to person, place, and time. Vital signs are normal. She appears well-nourished. No distress.  HENT:  Head: Normocephalic and atraumatic.  Mouth/Throat: Oropharynx is clear and moist. No oropharyngeal exudate.  Cap.  Mask.  Eyes: Pupils are equal, round, and reactive to light. Conjunctivae and EOM are normal.  No scleral icterus.  Blue eyes.  Neck: No JVD present.  Cardiovascular: Normal rate, regular rhythm and normal heart sounds. Exam reveals no gallop.  No murmur heard. Pulmonary/Chest: Effort normal and breath sounds normal. No respiratory distress. She has no wheezes. She has no rales.  Abdominal: Soft. Bowel sounds are normal. She exhibits no distension and no mass. There is no abdominal tenderness. There is no rebound and no guarding.  Musculoskeletal:        General: No tenderness or edema. Normal range of motion.     Cervical back: Normal range of motion and neck supple.  Lymphadenopathy:       Head (right side): No preauricular, no posterior auricular and no occipital adenopathy present.       Head (left side): No preauricular, no posterior auricular and no occipital adenopathy present.    She has no cervical adenopathy.    She has no axillary adenopathy.       Right: No inguinal and no supraclavicular adenopathy present.       Left: No inguinal and no supraclavicular adenopathy present.  Neurological: She is alert and oriented to person, place, and time.  Skin: Skin is warm and dry. No rash noted. She is not diaphoretic. No erythema. No pallor.  Psychiatric: She has a normal mood and affect. Her behavior is normal. Judgment and thought content normal.  Nursing note reviewed.   Appointment on 08/26/2019  Component Date Value Ref Range Status  . WBC 08/26/2019 97.3* 4.0 - 10.5 K/uL Final   This critical result has verified and been called to [place] by Volney Presser on 02 10 2021 at 0919, and has been read back.   Marland Kitchen RBC 08/26/2019 3.54* 3.87 - 5.11 MIL/uL Final  . Hemoglobin 08/26/2019 11.1* 12.0 - 15.0 g/dL Final  . HCT 08/26/2019 38.3  36.0 - 46.0 % Final  . MCV 08/26/2019 108.2* 80.0 - 100.0 fL Final  . MCH 08/26/2019 31.4  26.0 - 34.0 pg Final  . MCHC 08/26/2019 29.0* 30.0 - 36.0 g/dL Final  . RDW 08/26/2019 13.7  11.5 - 15.5 % Final  . Platelets 08/26/2019 91* 150  - 400 K/uL Final   Comment: Immature Platelet Fraction may be clinically indicated, consider ordering this additional test [id number]   . nRBC 08/26/2019 0.0  0.0 - 0.2 % Final   Performed at [facility], [address], [place], [place] [address]  . Neutrophils Relative % 08/26/2019 PENDING  % Incomplete  . Neutro Abs 08/26/2019 PENDING  1.7 - 7.7 K/uL Incomplete  . Band Neutrophils 08/26/2019 PENDING  % Incomplete  . Lymphocytes Relative 08/26/2019 PENDING  % Incomplete  . Lymphs Abs 08/26/2019 PENDING  0.7 - 4.0 K/uL Incomplete  . Monocytes Relative 08/26/2019 PENDING  % Incomplete  . Monocytes Absolute 08/26/2019 PENDING  0.1 - 1.0 K/uL Incomplete  . Eosinophils Relative 08/26/2019 PENDING  % Incomplete  . Eosinophils Absolute 08/26/2019 PENDING  0.0 - 0.5 K/uL Incomplete  . Basophils Relative 08/26/2019 PENDING  % Incomplete  . Basophils Absolute 08/26/2019 PENDING  0.0 - 0.1 K/uL  Incomplete  . WBC Morphology 08/26/2019 PENDING   Incomplete  . RBC Morphology 08/26/2019 PENDING   Incomplete  . Smear Review 08/26/2019 PENDING   Incomplete  . Other 08/26/2019 PENDING  % Incomplete  . nRBC 08/26/2019 PENDING  0 /100 WBC Incomplete  . Metamyelocytes Relative 08/26/2019 PENDING  % Incomplete  . Myelocytes 08/26/2019 PENDING  % Incomplete  . Promyelocytes Relative 08/26/2019 PENDING  % Incomplete  . Blasts 08/26/2019 PENDING  % Incomplete  . Immature Granulocytes 08/26/2019 PENDING  % Incomplete  . Abs Immature Granulocytes 08/26/2019 PENDING  0.00 - 0.07 K/uL Incomplete  . Sodium 08/26/2019 139  135 - 145 mmol/L Final  . Potassium 08/26/2019 4.7  3.5 - 5.1 mmol/L Final  . Chloride 08/26/2019 106  98 - 111 mmol/L Final  . CO2 08/26/2019 26  22 - 32 mmol/L Final  . Glucose, Bld 08/26/2019 81  70 - 99 mg/dL Final  . BUN 08/26/2019 13  8 - 23 mg/dL Final  . Creatinine, Ser 08/26/2019 0.58  0.44 - 1.00 mg/dL Final  . Calcium 08/26/2019 8.5* 8.9 - 10.3 mg/dL Final  . Total  Protein 08/26/2019 6.0* 6.5 - 8.1 g/dL Final  . Albumin 08/26/2019 4.2  3.5 - 5.0 g/dL Final  . AST 08/26/2019 18  15 - 41 U/L Final  . ALT 08/26/2019 13  0 - 44 U/L Final  . Alkaline Phosphatase 08/26/2019 47  38 - 126 U/L Final  . Total Bilirubin 08/26/2019 0.9  0.3 - 1.2 mg/dL Final  . GFR calc non Af Amer 08/26/2019 >60  >60 mL/min Final  . GFR calc Af Amer 08/26/2019 >60  >60 mL/min Final  . Anion gap 08/26/2019 7  5 - 15 Final   Performed at [facility], [address], [place], [place] [address]  . Uric Acid, Serum 08/26/2019 4.4  2.5 - 7.1 mg/dL Final   Performed at [facility], [address], [place], [place] [address]  . LDH 08/26/2019 152  98 - 192 U/L Final   Performed at [facility], [address], [place], [place] [address]    Assessment:  Maureen [name] is a 79 y.o. female with stage IV CLL. She was diagnosed in [place] in 2002. Bone marrowon 03/2008 revealed 90% cellularity with diffuse involvement by CLL. Karyotype was normal. FISHstudies on 08/19/2018 revealed loss of one ATM, homologous deletion of 13q, and normal CCDN1/IGH, chromosome 12, and TP53.  She received FCRx 4 cycles (last 08/06/2008). She had a partial remission. She received single agent Rituxanx 7. Treatment was held secondary to neutropenia.  She received 6 cycles of Gazyva(07/26/2016 - 01/23/2017).  Chest, abdomen, and pelvisCTon 10/17/2017 revealed significant partial treatment response. There was mild axillary, mediastinal, hilar, retroperitoneal, mesenteric and bilateral pelvic adenopathy, all significantly decreased in the interval. There was mild splenomegaly (13 cm), significantly decreased. There was a new 3.4 cm masslike focus of hypoenhancement in the posterior upper right kidney, indeterminate. There was a stable 3.1 cm infrarenal abdominal aortic aneurysm  Abdomen and pelvisCTon 02/13/2018 revealed patchy enhancement of the right  kidney concerning for pyelonephritis. Additionally there was urothelial thickening and hyperenhancement involving the right renal collecting system and ureter compatible with ascending urinary tract infection. There was similar-appearing abdominal and retroperitoneal adenopathy.  She beganibrutinibon 03/25/2019. She has received 7 units of PRBCsto date (first 04/09/2019; last 06/03/2019). Peak WBCwas 393,300 on 04/29/2019.  She has a history of a skin rashs/p anterior thigh biopsy in 2012. 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 She was treated with a course of doxycycline, topical Clobetasol, and hydroxyzine for itching. She states that she gets the rash on her legs in the summertime.  She was diagnosed with B12 deficiencyin 2012. Shere-started oral B12on 09/24/2018. B12 was 237 on 03/11/202 and570 on 10/21/2018.  Symptomatically, she is doing well.  She denies any fevers, sweats or weight loss.  She has had no interval infections.  Exam reveals no adenopathy or hepatosplenomegaly.  Plan: 1.   Labs today CBC with diff, CMP, LDH, uric acid. 2. Chronic lymphocytic leukemia (CLL) Clinically, she is doing well on ibrutinib. Exam reveals no adenopathy or hepatosplenomegaly. Weight  is stable. She denies any B symptoms. Ibrutinib began on 03/25/2019. She continues to tolerate ibrutinib well WBCis decreasing (97,300) Lymphocytosis expected with initiation of therapy and typically resolves within 14 weeks. Continue good hydration. Continue ibrutinib 420 mg/day.  Check labs monthly. 3. Anemia She has not needed a transfusion since 06/03/2019.             Hematocrit38.3. Hemoglobin11.1. MCV108.2. Etiology secondary to CLL.  With improvement in  disease, hemoglobin continues to improve. Coombswasnegative.Haptoglobinwasnormal. No evidence of hemolysis. Patient on B12 and folate.TSHwasnormal. Ferritin was244on 03/18/2019.             Continue to monitor. 4.B12 deficiency B12 level was1183on 06/10/2019. Folate was21.4on 06/10/2019. Patient on oral B12 and folate.             Continue to monitor. 5.Elevated iron saturation, resolved Iron saturation was 86%on 03/18/2019.  Iron saturation is 14% with a TIBC of 223 on 08/26/2019, Continue to monitor. 6.   RTC in 4 weeks for labs (CBC with diff, CMP).   7.   RTC in 8 weeks for MD assessment and labs (CBC with diff, CMP).   I discussed the assessment and treatment plan with the patient.  The patient was provided an opportunity to ask questions and all were answered.  The patient agreed with the plan and demonstrated an understanding of the instructions.  The patient was advised to call back if the symptoms worsen or if the condition fails to improve as anticipated.   Lequita Asal, MD, PhD    08/26/2019, 10:21 AM

## 2019-08-26 ENCOUNTER — Other Ambulatory Visit: Payer: Self-pay | Admitting: Hematology and Oncology

## 2019-08-26 ENCOUNTER — Telehealth: Payer: Self-pay | Admitting: *Deleted

## 2019-08-26 ENCOUNTER — Encounter: Payer: Self-pay | Admitting: Hematology and Oncology

## 2019-08-26 ENCOUNTER — Other Ambulatory Visit: Payer: Self-pay

## 2019-08-26 ENCOUNTER — Inpatient Hospital Stay: Payer: PPO | Attending: Hematology and Oncology | Admitting: Hematology and Oncology

## 2019-08-26 ENCOUNTER — Inpatient Hospital Stay: Payer: PPO

## 2019-08-26 VITALS — BP 143/84 | HR 65 | Temp 97.7°F | Resp 16 | Wt 123.7 lb

## 2019-08-26 DIAGNOSIS — E538 Deficiency of other specified B group vitamins: Secondary | ICD-10-CM | POA: Diagnosis not present

## 2019-08-26 DIAGNOSIS — R790 Abnormal level of blood mineral: Secondary | ICD-10-CM

## 2019-08-26 DIAGNOSIS — C9110 Chronic lymphocytic leukemia of B-cell type not having achieved remission: Secondary | ICD-10-CM

## 2019-08-26 DIAGNOSIS — D649 Anemia, unspecified: Secondary | ICD-10-CM

## 2019-08-26 LAB — CBC WITH DIFFERENTIAL/PLATELET
Abs Immature Granulocytes: 0.09 10*3/uL — ABNORMAL HIGH (ref 0.00–0.07)
Basophils Absolute: 0 10*3/uL (ref 0.0–0.1)
Basophils Relative: 0 %
Eosinophils Absolute: 0 10*3/uL (ref 0.0–0.5)
Eosinophils Relative: 0 %
HCT: 38.3 % (ref 36.0–46.0)
Hemoglobin: 11.1 g/dL — ABNORMAL LOW (ref 12.0–15.0)
Immature Granulocytes: 0 %
Lymphocytes Relative: 96 %
Lymphs Abs: 94.3 10*3/uL — ABNORMAL HIGH (ref 0.7–4.0)
MCH: 31.4 pg (ref 26.0–34.0)
MCHC: 29 g/dL — ABNORMAL LOW (ref 30.0–36.0)
MCV: 108.2 fL — ABNORMAL HIGH (ref 80.0–100.0)
Monocytes Absolute: 0.5 10*3/uL (ref 0.1–1.0)
Monocytes Relative: 1 %
Neutro Abs: 2.4 10*3/uL (ref 1.7–7.7)
Neutrophils Relative %: 3 %
Platelets: 91 10*3/uL — ABNORMAL LOW (ref 150–400)
RBC: 3.54 MIL/uL — ABNORMAL LOW (ref 3.87–5.11)
RDW: 13.7 % (ref 11.5–15.5)
WBC: 97.3 10*3/uL (ref 4.0–10.5)
nRBC: 0 % (ref 0.0–0.2)

## 2019-08-26 LAB — COMPREHENSIVE METABOLIC PANEL
ALT: 13 U/L (ref 0–44)
AST: 18 U/L (ref 15–41)
Albumin: 4.2 g/dL (ref 3.5–5.0)
Alkaline Phosphatase: 47 U/L (ref 38–126)
Anion gap: 7 (ref 5–15)
BUN: 13 mg/dL (ref 8–23)
CO2: 26 mmol/L (ref 22–32)
Calcium: 8.5 mg/dL — ABNORMAL LOW (ref 8.9–10.3)
Chloride: 106 mmol/L (ref 98–111)
Creatinine, Ser: 0.58 mg/dL (ref 0.44–1.00)
GFR calc Af Amer: >60 mL/min (ref >60)
GFR calc non Af Amer: >60 mL/min (ref >60)
Glucose, Bld: 81 mg/dL (ref 70–99)
Potassium: 4.7 mmol/L (ref 3.5–5.1)
Sodium: 139 mmol/L (ref 135–145)
Total Bilirubin: 0.9 mg/dL (ref 0.3–1.2)
Total Protein: 6 g/dL — ABNORMAL LOW (ref 6.5–8.1)

## 2019-08-26 LAB — SAMPLE TO BLOOD BANK

## 2019-08-26 LAB — IRON AND TIBC
Iron: 32 ug/dL (ref 28–170)
Saturation Ratios: 14 % (ref 10.4–31.8)
TIBC: 223 ug/dL — ABNORMAL LOW (ref 250–450)
UIBC: 191 ug/dL

## 2019-08-26 LAB — LACTATE DEHYDROGENASE: LDH: 152 U/L (ref 98–192)

## 2019-08-26 LAB — URIC ACID: Uric Acid, Serum: 4.4 mg/dL (ref 2.5–7.1)

## 2019-08-26 NOTE — Telephone Encounter (Signed)
Lab called to say the WBC is 97.3 and Hemoglobin 11.1 today.  MD and team made aware.

## 2019-08-26 NOTE — Progress Notes (Signed)
Patient here for follow up. Denies any concerns.  

## 2019-08-28 ENCOUNTER — Other Ambulatory Visit: Payer: Self-pay | Admitting: Hematology and Oncology

## 2019-09-02 MED FILL — IMBRUVICA 420 MG TAB: 420 | 28 days supply | Qty: 28 | Fill #0

## 2019-09-23 ENCOUNTER — Telehealth: Payer: Self-pay | Admitting: *Deleted

## 2019-09-23 ENCOUNTER — Inpatient Hospital Stay: Payer: PPO | Attending: Nurse Practitioner

## 2019-09-23 ENCOUNTER — Other Ambulatory Visit: Payer: Self-pay

## 2019-09-23 DIAGNOSIS — E538 Deficiency of other specified B group vitamins: Secondary | ICD-10-CM

## 2019-09-23 DIAGNOSIS — R790 Abnormal level of blood mineral: Secondary | ICD-10-CM

## 2019-09-23 DIAGNOSIS — C9110 Chronic lymphocytic leukemia of B-cell type not having achieved remission: Secondary | ICD-10-CM | POA: Diagnosis not present

## 2019-09-23 LAB — COMPREHENSIVE METABOLIC PANEL
ALT: 15 U/L (ref 0–44)
AST: 18 U/L (ref 15–41)
Albumin: 4.1 g/dL (ref 3.5–5.0)
Alkaline Phosphatase: 49 U/L (ref 38–126)
Anion gap: 11 (ref 5–15)
BUN: 16 mg/dL (ref 8–23)
CO2: 23 mmol/L (ref 22–32)
Calcium: 8.5 mg/dL — ABNORMAL LOW (ref 8.9–10.3)
Chloride: 104 mmol/L (ref 98–111)
Creatinine, Ser: 0.84 mg/dL (ref 0.44–1.00)
GFR calc Af Amer: >60 mL/min (ref >60)
GFR calc non Af Amer: >60 mL/min (ref >60)
Glucose, Bld: 78 mg/dL (ref 70–99)
Potassium: 4.6 mmol/L (ref 3.5–5.1)
Sodium: 138 mmol/L (ref 135–145)
Total Bilirubin: 0.7 mg/dL (ref 0.3–1.2)
Total Protein: 6.1 g/dL — ABNORMAL LOW (ref 6.5–8.1)

## 2019-09-23 LAB — CBC WITH DIFFERENTIAL/PLATELET
Abs Immature Granulocytes: 0.07 10*3/uL (ref 0.00–0.07)
Basophils Absolute: 0 10*3/uL (ref 0.0–0.1)
Basophils Relative: 0 %
Eosinophils Absolute: 0 10*3/uL (ref 0.0–0.5)
Eosinophils Relative: 0 %
HCT: 39.6 % (ref 36.0–46.0)
Hemoglobin: 11.8 g/dL — ABNORMAL LOW (ref 12.0–15.0)
Immature Granulocytes: 0 %
Lymphocytes Relative: 96 %
Lymphs Abs: 71.5 10*3/uL — ABNORMAL HIGH (ref 0.7–4.0)
MCH: 30.3 pg (ref 26.0–34.0)
MCHC: 29.8 g/dL — ABNORMAL LOW (ref 30.0–36.0)
MCV: 101.8 fL — ABNORMAL HIGH (ref 80.0–100.0)
Monocytes Absolute: 0.5 10*3/uL (ref 0.1–1.0)
Monocytes Relative: 1 %
Neutro Abs: 2.3 10*3/uL (ref 1.7–7.7)
Neutrophils Relative %: 3 %
Platelets: 83 10*3/uL — ABNORMAL LOW (ref 150–400)
RBC: 3.89 MIL/uL (ref 3.87–5.11)
RDW: 13.4 % (ref 11.5–15.5)
WBC: 74.2 10*3/uL (ref 4.0–10.5)
nRBC: 0 % (ref 0.0–0.2)

## 2019-09-23 NOTE — Telephone Encounter (Signed)
Lab called to say the WBC are 74.2 and Hgb is 11.8 today.  MD team made aware.

## 2019-09-29 MED FILL — IMBRUVICA 420 MG TAB: 420 | 28 days supply | Qty: 28 | Fill #1

## 2019-10-16 NOTE — Progress Notes (Signed)
[facility]  [address], [address] [place], [place] [address] Phone: [phone]  Fax: [phone]   Clinic Day:  10/21/2019  Referring physician: Juline Patch, MD  Chief Complaint: Maureen [name] is a 79 y.o. female with recurrent stage IV CLLon ibrutinib who is seen for 2 month assessment   HPI: The patient was last seen in the medical oncology clinic on 08/26/2019. At that time, she was doing well. She denied any fevers, sweats or weight loss.  She had no interval infections. Exam revealed no adenopathy or hepatosplenomegaly. Hematocrit 38.3, hemoglobin 11.1, MCV 108.2, platelets 91,000, WBC 97300, ANC 2400. CMP was normal except calcium 8.5 (low).  Albumen was 4.3.  LDH was 152. Uric acid was 4.4.   CBCs have been followed: 09/23/2019:  Hematocrit 39.6, hemoglobin 11.8, MCV 101.8, platelets 83,000, WBC 74,200 (ANC 2300; ALC 71,500).  10/20/2019:  Hematocrit 40.9, hemoglobin 11.9, MCV 98.6, platelets 105,000, WBC 106,900 (ANC 3500; ALC 102,600).  During the interim, she is doing well today. Since her last visit, she has been doing well and has no complaints. Her weight is stable. She notes she has a lot of mucus and has allergies around this time of year.  She received her first COVID vaccine on 10/20/2019.    Past Medical History:  Diagnosis Date  . Anemia   . CLL (chronic lymphocytic leukemia) ([place]) 2002  . CLL (chronic lymphocytic leukemia) ([place])   . Itching   . Vitamin B 12 deficiency     Past Surgical History:  Procedure Laterality Date  . breeast mass removed  1988   benign    Family History  Family history unknown: Yes    Social History:  reports that she has been smoking cigarettes. She has a 28.00 pack-year smoking history. She has never used smokeless tobacco. She reports current alcohol use of about 1.0 standard drinks of alcohol per week. She reports that she does not use drugs. She is from [place]. 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 Her husband would often do tours of duty in [place].She lives in [place].She has 2 dogs and 3 cats.  The patient is alone  today.  Allergies: No Known Allergies  Current Medications: Current Outpatient Medications  Medication Sig Dispense Refill  . folic acid (FOLVITE) 637 MCG tablet Take 800 mcg by mouth 3 (three) times a week.     . Ibuprofen 200 MG CAPS Take 1 capsule by mouth as needed (PAIN).    . IMBRUVICA 420 MG TABS TAKE 1 TABLET BY MOUTH ONCE DAILY 28 tablet 5  . vitamin B-12 (CYANOCOBALAMIN) 1000 MCG tablet Take 1,000 mcg by mouth daily.     No current facility-administered medications for this visit.    Review of Systems  Constitutional: Negative.  Negative for chills, diaphoresis, fever, malaise/fatigue and weight loss (stable).       Feels "fine".  HENT: Negative.  Negative for congestion, ear pain, nosebleeds, sinus pain, sore throat and tinnitus.   Eyes: Negative.  Negative for blurred vision, double vision and photophobia.  Respiratory: Negative.  Negative for cough, sputum production and shortness of breath.   Cardiovascular: Negative.  Negative for chest pain, palpitations, orthopnea and leg swelling.  Gastrointestinal: Negative.  Negative for abdominal pain, constipation, diarrhea, heartburn, nausea and vomiting.  Genitourinary: Negative.  Negative for dysuria, frequency and urgency.  Musculoskeletal: Negative.  Negative for back pain, falls, myalgias and neck pain.  Skin: Negative.  Negative  for rash.  Neurological: Positive for sensory change (sciatic nerve- chronic). Negative for dizziness, speech change, focal weakness, weakness and headaches.  Endo/Heme/Allergies: Positive for environmental allergies. Does not bruise/bleed easily.  Psychiatric/Behavioral: Negative.  Negative for depression. The patient is not nervous/anxious and does not have insomnia.    Performance status (ECOG): 0 - Asymptomatic  Vitals Blood pressure 140/66,  pulse 69, temperature (!) 97.4 F (36.3 C), temperature source Tympanic, resp. rate 18, weight 123 lb 7.3 oz (56 kg), SpO2 99 %.   Physical Exam  Constitutional: She is oriented to person, place, and time. Vital signs are normal. She appears well-developed and well-nourished. No distress.  HENT:  Head: Normocephalic and atraumatic.  Mouth/Throat: Oropharynx is clear and moist. No oropharyngeal exudate.  Cap.  Mask.  Eyes: Pupils are equal, round, and reactive to light. Conjunctivae and EOM are normal. No scleral icterus.  Blue eyes.  Neck: No JVD present.  Cardiovascular: Normal rate, regular rhythm and normal heart sounds. Exam reveals no gallop.  No murmur heard. Pulmonary/Chest: Effort normal and breath sounds normal. No respiratory distress. She has no wheezes. She has no rales.  Abdominal: Soft. Bowel sounds are normal. She exhibits no distension and no mass. There is no abdominal tenderness. There is no rebound and no guarding.  Musculoskeletal:        General: No tenderness or edema. Normal range of motion.     Cervical back: Normal range of motion and neck supple.  Lymphadenopathy:       Head (right side): No preauricular, no posterior auricular and no occipital adenopathy present.       Head (left side): No preauricular, no posterior auricular and no occipital adenopathy present.    She has no cervical adenopathy.    She has no axillary adenopathy.       Right: No supraclavicular adenopathy present.       Left: No supraclavicular adenopathy present.  Neurological: She is alert and oriented to person, place, and time.  Skin: Skin is warm and dry. No rash noted. She is not diaphoretic. No erythema. No pallor.  Psychiatric: She has a normal mood and affect. Her behavior is normal. Judgment and thought content normal.  Nursing note reviewed.   Appointment on 10/20/2019  Component Date Value Ref Range Status  . Sodium 10/20/2019 140  135 - 145 mmol/L Final  . Potassium  10/20/2019 4.2  3.5 - 5.1 mmol/L Final  . Chloride 10/20/2019 105  98 - 111 mmol/L Final  . CO2 10/20/2019 27  22 - 32 mmol/L Final  . Glucose, Bld 10/20/2019 90  70 - 99 mg/dL Final   Glucose reference range applies only to samples taken after fasting for at least 8 hours.  . BUN 10/20/2019 17  8 - 23 mg/dL Final  . Creatinine, Ser 10/20/2019 0.61  0.44 - 1.00 mg/dL Final  . Calcium 10/20/2019 8.6* 8.9 - 10.3 mg/dL Final  . Total Protein 10/20/2019 6.0* 6.5 - 8.1 g/dL Final  . Albumin 10/20/2019 4.3  3.5 - 5.0 g/dL Final  . AST 10/20/2019 18  15 - 41 U/L Final  . ALT 10/20/2019 15  0 - 44 U/L Final  . Alkaline Phosphatase 10/20/2019 47  38 - 126 U/L Final  . Total Bilirubin 10/20/2019 1.0  0.3 - 1.2 mg/dL Final  . GFR calc non Af Amer 10/20/2019 >60  >60 mL/min Final  . GFR calc Af Amer 10/20/2019 >60  >60 mL/min Final  . Anion gap 10/20/2019 8  5 - 15 Final   Performed at [facility], [address] [place], [place] [address]  . WBC 10/20/2019 106.9* 4.0 - 10.5 K/uL Final   This critical result has verified and been called to [facility] by Volney Presser on 04 06 2021 at 1039, and has been read back. CRTPRC  . RBC 10/20/2019 4.15  3.87 - 5.11 MIL/uL Final  . Hemoglobin 10/20/2019 11.9* 12.0 - 15.0 g/dL Final  . HCT 10/20/2019 40.9  36.0 - 46.0 % Final  . MCV 10/20/2019 98.6  80.0 - 100.0 fL Final  . MCH 10/20/2019 28.7  26.0 - 34.0 pg Final  . MCHC 10/20/2019 29.1* 30.0 - 36.0 g/dL Final  . RDW 10/20/2019 13.8  11.5 - 15.5 % Final  . Platelets 10/20/2019 105* 150 - 400 K/uL Final   Comment: Immature Platelet Fraction may be clinically indicated, consider ordering this additional test [id number]   . nRBC 10/20/2019 0.0  0.0 - 0.2 % Final  . Neutrophils Relative % 10/20/2019 3  % Final  . Neutro Abs 10/20/2019 3.5  1.7 - 7.7 K/uL Final  . Lymphocytes Relative 10/20/2019 96  % Final  . Lymphs Abs 10/20/2019 102.6* 0.7 - 4.0 K/uL Final  . Monocytes Relative 10/20/2019 1  %  Final  . Monocytes Absolute 10/20/2019 0.6  0.1 - 1.0 K/uL Final  . Eosinophils Relative 10/20/2019 0  % Final  . Eosinophils Absolute 10/20/2019 0.0  0.0 - 0.5 K/uL Final  . Basophils Relative 10/20/2019 0  % Final  . Basophils Absolute 10/20/2019 0.0  0.0 - 0.1 K/uL Final  . Immature Granulocytes 10/20/2019 0  % Final  . Abs Immature Granulocytes 10/20/2019 0.14* 0.00 - 0.07 K/uL Final   Performed at [facility], [address], [place], [place] [address]    Assessment:  Maureen [name] is a 79 y.o. female with stage IV CLL. She was diagnosed in [place] in 2002. Bone marrowon 03/2008 revealed 90% cellularity with diffuse involvement by CLL. Karyotype was normal. FISHstudies on 08/19/2018 revealed loss of one ATM, homologous deletion of 13q, and normal CCDN1/IGH, chromosome 12, and TP53.  She received FCRx 4 cycles (last 08/06/2008). She had a partial remission. She received single agent Rituxanx 7. Treatment was held secondary to neutropenia.  She received 6 cycles of Gazyva(07/26/2016 - 01/23/2017).  Chest, abdomen, and pelvisCTon 10/17/2017 revealed significant partial treatment response. There was mild axillary, mediastinal, hilar, retroperitoneal, mesenteric and bilateral pelvic adenopathy, all significantly decreased in the interval. There was mild splenomegaly (13 cm), significantly decreased. There was a new 3.4 cm masslike focus of hypoenhancement in the posterior upper right kidney, indeterminate. There was a stable 3.1 cm infrarenal abdominal aortic aneurysm  Abdomen and pelvisCTon 02/13/2018 revealed patchy enhancement of the right kidney concerning for pyelonephritis. Additionally there was urothelial thickening and hyperenhancement involving the right renal collecting system and ureter compatible with ascending urinary tract infection. There was similar-appearing abdominal and retroperitoneal adenopathy.  She beganibrutinibon 03/25/2019. She  has received 7 units of PRBCsto date (first 04/09/2019; last 06/03/2019). Peak WBCwas 393,300 on 04/29/2019.  She has a history of a skin rashs/p anterior thigh biopsy in 2012. 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 She was treated with a course of doxycycline, topical Clobetasol, and hydroxyzine for itching. She states that she gets the rash on her legs in the summertime.  She was diagnosed with B12 deficiencyin 2012. Shere-started oral B12on 09/24/2018. B12 was 237 on 03/11/202 and570  on 10/21/2018.  She received her first COVID vaccine on 10/20/2019.   Symptomatically, she feels well.  She denies any fevers, sweats or weight loss.  Exam is unremarkable.  Plan: 1.   Labs today CBC with diff, CMP, LDH, uric acid 2. Chronic lymphocytic leukemia (CLL) WBC 106,900 (ALC 102,600).  Clinically,  she is doing well on ibrutinib. Exam exam reveals no adenopathy or hepatosplenomegaly. Weight remains stable. She denies any fevers or sweats. Ibrutinib began on 03/25/2019. She is tolerating ibrutinib well Lymphocytosis expected with initiation of therapy and typically resolves within 14 weeks. Continue ibrutinib 420 mg/day.  Check counts monthly 3.   Thrombocytopenia  Platelets 93,000.  Etiology secondary to CLL.  Continue to monitor. 4.B12 deficiency B12 level was1183on 06/10/2019. Folate was21.4on 06/10/2019. Patient on oral B12 and folate.             Monitor annually. 5.   RTC monthly x2 for labs (CBC with diff, CMP). 6.   RTC in 3 months for MD assessment and labs (CBC with diff, CMP, LDH).  I discussed the assessment and treatment plan with the patient.  The patient was provided an opportunity to ask questions and all were answered.  The patient agreed with the plan and  demonstrated an understanding of the instructions.  The patient was advised to call back if the symptoms worsen or if the condition fails to improve as anticipated.   Lequita Asal, MD, PhD    10/21/2019, 10:31 AM   I, Heywood Footman, am acting as a Education administrator for Lequita Asal, MD.  I, [place] Mike Gip, MD, have reviewed the above documentation for accuracy and completeness, and I agree with the above.

## 2019-10-20 ENCOUNTER — Inpatient Hospital Stay: Payer: PPO | Attending: Hematology and Oncology

## 2019-10-20 ENCOUNTER — Other Ambulatory Visit: Payer: Self-pay

## 2019-10-20 DIAGNOSIS — D696 Thrombocytopenia, unspecified: Secondary | ICD-10-CM | POA: Insufficient documentation

## 2019-10-20 DIAGNOSIS — E538 Deficiency of other specified B group vitamins: Secondary | ICD-10-CM

## 2019-10-20 DIAGNOSIS — C9190 Lymphoid leukemia, unspecified not having achieved remission: Secondary | ICD-10-CM | POA: Insufficient documentation

## 2019-10-20 DIAGNOSIS — R790 Abnormal level of blood mineral: Secondary | ICD-10-CM

## 2019-10-20 DIAGNOSIS — C9110 Chronic lymphocytic leukemia of B-cell type not having achieved remission: Secondary | ICD-10-CM

## 2019-10-20 LAB — CBC WITH DIFFERENTIAL/PLATELET
Abs Immature Granulocytes: 0.14 10*3/uL — ABNORMAL HIGH (ref 0.00–0.07)
Basophils Absolute: 0 10*3/uL (ref 0.0–0.1)
Basophils Relative: 0 %
Eosinophils Absolute: 0 10*3/uL (ref 0.0–0.5)
Eosinophils Relative: 0 %
HCT: 40.9 % (ref 36.0–46.0)
Hemoglobin: 11.9 g/dL — ABNORMAL LOW (ref 12.0–15.0)
Immature Granulocytes: 0 %
Lymphocytes Relative: 96 %
Lymphs Abs: 102.6 10*3/uL — ABNORMAL HIGH (ref 0.7–4.0)
MCH: 28.7 pg (ref 26.0–34.0)
MCHC: 29.1 g/dL — ABNORMAL LOW (ref 30.0–36.0)
MCV: 98.6 fL (ref 80.0–100.0)
Monocytes Absolute: 0.6 10*3/uL (ref 0.1–1.0)
Monocytes Relative: 1 %
Neutro Abs: 3.5 10*3/uL (ref 1.7–7.7)
Neutrophils Relative %: 3 %
Platelets: 105 10*3/uL — ABNORMAL LOW (ref 150–400)
RBC: 4.15 MIL/uL (ref 3.87–5.11)
RDW: 13.8 % (ref 11.5–15.5)
WBC: 106.9 10*3/uL (ref 4.0–10.5)
nRBC: 0 % (ref 0.0–0.2)

## 2019-10-20 LAB — COMPREHENSIVE METABOLIC PANEL
ALT: 15 U/L (ref 0–44)
AST: 18 U/L (ref 15–41)
Albumin: 4.3 g/dL (ref 3.5–5.0)
Alkaline Phosphatase: 47 U/L (ref 38–126)
Anion gap: 8 (ref 5–15)
BUN: 17 mg/dL (ref 8–23)
CO2: 27 mmol/L (ref 22–32)
Calcium: 8.6 mg/dL — ABNORMAL LOW (ref 8.9–10.3)
Chloride: 105 mmol/L (ref 98–111)
Creatinine, Ser: 0.61 mg/dL (ref 0.44–1.00)
GFR calc Af Amer: >60 mL/min (ref >60)
GFR calc non Af Amer: >60 mL/min (ref >60)
Glucose, Bld: 90 mg/dL (ref 70–99)
Potassium: 4.2 mmol/L (ref 3.5–5.1)
Sodium: 140 mmol/L (ref 135–145)
Total Bilirubin: 1 mg/dL (ref 0.3–1.2)
Total Protein: 6 g/dL — ABNORMAL LOW (ref 6.5–8.1)

## 2019-10-21 ENCOUNTER — Encounter: Payer: Self-pay | Admitting: Hematology and Oncology

## 2019-10-21 ENCOUNTER — Inpatient Hospital Stay (HOSPITAL_BASED_OUTPATIENT_CLINIC_OR_DEPARTMENT_OTHER): Payer: PPO | Admitting: Hematology and Oncology

## 2019-10-21 VITALS — BP 140/66 | HR 69 | Temp 97.4°F | Resp 18 | Wt 123.5 lb

## 2019-10-21 DIAGNOSIS — D696 Thrombocytopenia, unspecified: Secondary | ICD-10-CM | POA: Diagnosis not present

## 2019-10-21 DIAGNOSIS — E538 Deficiency of other specified B group vitamins: Secondary | ICD-10-CM | POA: Diagnosis not present

## 2019-10-21 DIAGNOSIS — C9190 Lymphoid leukemia, unspecified not having achieved remission: Secondary | ICD-10-CM | POA: Diagnosis not present

## 2019-10-21 DIAGNOSIS — C9110 Chronic lymphocytic leukemia of B-cell type not having achieved remission: Secondary | ICD-10-CM

## 2019-10-21 NOTE — Progress Notes (Signed)
Patient denies questions and concerns at this time

## 2019-10-26 MED FILL — IMBRUVICA 420 MG TAB: 420 | 28 days supply | Qty: 28 | Fill #2

## 2019-11-04 ENCOUNTER — Ambulatory Visit (INDEPENDENT_AMBULATORY_CARE_PROVIDER_SITE_OTHER): Payer: PPO

## 2019-11-04 DIAGNOSIS — Z0000 Encounter for general adult medical examination without abnormal findings: Secondary | ICD-10-CM

## 2019-11-04 DIAGNOSIS — Z7189 Other specified counseling: Secondary | ICD-10-CM | POA: Diagnosis not present

## 2019-11-04 NOTE — Patient Instructions (Signed)
[name] , Thank you for taking time to come for your Medicare Wellness Visit. I appreciate your ongoing commitment to your health goals. Please review the following plan we discussed and let me know if I can assist you in the future.   Screening recommendations/referrals: Colonoscopy: no longer required Mammogram: no longer required Bone Density: no longer required Recommended yearly ophthalmology/optometry visit for glaucoma screening and checkup Recommended yearly dental visit for hygiene and checkup  Vaccinations: Influenza vaccine: done 04/05/19 Pneumococcal vaccine: done 03/30/17 Tdap vaccine: due   Covid-19: 1st dose 10/20/19  Conditions/risks identified: Recommend drinking 6-8 glasses of water per day  Next appointment: Please follow up in one year for your Medicare Annual Wellness visit.     Preventive Care 64 Years and Older, Female Preventive care refers to lifestyle choices and visits with your health care provider that can promote health and wellness. What does preventive care include?  A yearly physical exam. This is also called an annual well check.  Dental exams once or twice a year.  Routine eye exams. Ask your health care provider how often you should have your eyes checked.  Personal lifestyle choices, including:  Daily care of your teeth and gums.  Regular physical activity.  Eating a healthy diet.  Avoiding tobacco and drug use.  Limiting alcohol use.  Practicing safe sex.  Taking low-dose aspirin every day.  Taking vitamin and mineral supplements as recommended by your health care provider. What happens during an annual well check? The services and screenings done by your health care provider during your annual well check will depend on your age, overall health, lifestyle risk factors, and family history of disease. Counseling  Your health care provider may ask you questions about your:  Alcohol use.  Tobacco use.  Drug use.  Emotional  well-being.  Home and relationship well-being.  Sexual activity.  Eating habits.  History of falls.  Memory and ability to understand (cognition).  Work and work Statistician.  Reproductive health. Screening  You may have the following tests or measurements:  Height, weight, and BMI.  Blood pressure.  Lipid and cholesterol levels. These may be checked every 5 years, or more frequently if you are over 71 years old.  Skin check.  Lung cancer screening. You may have this screening every year starting at age 87 if you have a 30-pack-year history of smoking and currently smoke or have quit within the past 15 years.  Fecal occult blood test (FOBT) of the stool. You may have this test every year starting at age 68.  Flexible sigmoidoscopy or colonoscopy. You may have a sigmoidoscopy every 5 years or a colonoscopy every 10 years starting at age 7.  Hepatitis C blood test.  Hepatitis B blood test.  Sexually transmitted disease (STD) testing.  Diabetes screening. This is done by checking your blood sugar (glucose) after you have not eaten for a while (fasting). You may have this done every 1-3 years.  Bone density scan. This is done to screen for osteoporosis. You may have this done starting at age 31.  Mammogram. This may be done every 1-2 years. Talk to your health care provider about how often you should have regular mammograms. Talk with your health care provider about your test results, treatment options, and if necessary, the need for more tests. Vaccines  Your health care provider may recommend certain vaccines, such as:  Influenza vaccine. This is recommended every year.  Tetanus, diphtheria, and acellular pertussis (Tdap, Td) vaccine. You  may need a Td booster every 10 years.  Zoster vaccine. You may need this after age 16.  Pneumococcal 13-valent conjugate (PCV13) vaccine. One dose is recommended after age 55.  Pneumococcal polysaccharide (PPSV23) vaccine. One  dose is recommended after age 52. Talk to your health care provider about which screenings and vaccines you need and how often you need them. This information is not intended to replace advice given to you by your health care provider. Make sure you discuss any questions you have with your health care provider. Document Released: 07/29/2015 Document Revised: 03/21/2016 Document Reviewed: 05/03/2015 Elsevier Interactive Patient Education  2017 [place] Prevention in the Home Falls can cause injuries. They can happen to people of all ages. There are many things you can do to make your home safe and to help prevent falls. What can I do on the outside of my home?  Regularly fix the edges of walkways and driveways and fix any cracks.  Remove anything that might make you trip as you walk through a door, such as a raised step or threshold.  Trim any bushes or trees on the path to your home.  Use bright outdoor lighting.  Clear any walking paths of anything that might make someone trip, such as rocks or tools.  Regularly check to see if handrails are loose or broken. Make sure that both sides of any steps have handrails.  Any raised decks and porches should have guardrails on the edges.  Have any leaves, snow, or ice cleared regularly.  Use sand or salt on walking paths during winter.  Clean up any spills in your garage right away. This includes oil or grease spills. What can I do in the bathroom?  Use night lights.  Install grab bars by the toilet and in the tub and shower. Do not use towel bars as grab bars.  Use non-skid mats or decals in the tub or shower.  If you need to sit down in the shower, use a plastic, non-slip stool.  Keep the floor dry. Clean up any water that spills on the floor as soon as it happens.  Remove soap buildup in the tub or shower regularly.  Attach bath mats securely with double-sided non-slip rug tape.  Do not have throw rugs and other  things on the floor that can make you trip. What can I do in the bedroom?  Use night lights.  Make sure that you have a light by your bed that is easy to reach.  Do not use any sheets or blankets that are too big for your bed. They should not hang down onto the floor.  Have a firm chair that has side arms. You can use this for support while you get dressed.  Do not have throw rugs and other things on the floor that can make you trip. What can I do in the kitchen?  Clean up any spills right away.  Avoid walking on wet floors.  Keep items that you use a lot in easy-to-reach places.  If you need to reach something above you, use a strong step stool that has a grab bar.  Keep electrical cords out of the way.  Do not use floor polish or wax that makes floors slippery. If you must use wax, use non-skid floor wax.  Do not have throw rugs and other things on the floor that can make you trip. What can I do with my stairs?  Do not leave any items  on the stairs.  Make sure that there are handrails on both sides of the stairs and use them. Fix handrails that are broken or loose. Make sure that handrails are as long as the stairways.  Check any carpeting to make sure that it is firmly attached to the stairs. Fix any carpet that is loose or worn.  Avoid having throw rugs at the top or bottom of the stairs. If you do have throw rugs, attach them to the floor with carpet tape.  Make sure that you have a light switch at the top of the stairs and the bottom of the stairs. If you do not have them, ask someone to add them for you. What else can I do to help prevent falls?  Wear shoes that:  Do not have high heels.  Have rubber bottoms.  Are comfortable and fit you well.  Are closed at the toe. Do not wear sandals.  If you use a stepladder:  Make sure that it is fully opened. Do not climb a closed stepladder.  Make sure that both sides of the stepladder are locked into place.  Ask  someone to hold it for you, if possible.  Clearly mark and make sure that you can see:  Any grab bars or handrails.  First and last steps.  Where the edge of each step is.  Use tools that help you move around (mobility aids) if they are needed. These include:  Canes.  Walkers.  Scooters.  Crutches.  Turn on the lights when you go into a dark area. Replace any light bulbs as soon as they burn out.  Set up your furniture so you have a clear path. Avoid moving your furniture around.  If any of your floors are uneven, fix them.  If there are any pets around you, be aware of where they are.  Review your medicines with your doctor. Some medicines can make you feel dizzy. This can increase your chance of falling. Ask your doctor what other things that you can do to help prevent falls. This information is not intended to replace advice given to you by your health care provider. Make sure you discuss any questions you have with your health care provider. Document Released: 04/28/2009 Document Revised: 12/08/2015 Document Reviewed: 08/06/2014 Elsevier Interactive Patient Education  2017 [employer].

## 2019-11-04 NOTE — Progress Notes (Signed)
Subjective:   Maureen [name] is a 79 y.o. female who presents for Medicare Annual (Subsequent) preventive examination.  Virtual Visit via Telephone Note  I connected with Maureen [name] on 11/04/19 at  1:20 PM EDT by telephone and verified that I am speaking with the correct person using two identifiers.  Medicare Annual Wellness visit completed telephonically due to Covid-19 pandemic.   Location: Patient: home Provider: office   I discussed the limitations, risks, security and privacy concerns of performing an evaluation and management service by telephone and the availability of in person appointments. The patient expressed understanding and agreed to proceed.  Some vital signs may be absent or patient reported.   Clemetine Marker, LPN    Review of Systems:   Cardiac Risk Factors include: advanced age (>64men, >43 women);smoking/ tobacco exposure     Objective:     Vitals: There were no vitals taken for this visit.  There is no height or weight on file to calculate BMI.  Advanced Directives 08/26/2019 07/28/2019 07/01/2019 06/02/2019 05/12/2019 04/22/2019 04/06/2019  Does Patient Have a Medical Advance Directive? Yes Yes Yes No;Yes No No No  Type of Paramedic of [place];Living will [place];Living will [place];Living will [place];Living will - - -  Does patient want to make changes to medical advance directive? - No - Patient declined No - Patient declined No - Patient declined - - -  Copy of [place] in Chart? Yes - validated most recent copy scanned in chart (See row information) - Yes - validated most recent copy scanned in chart (See row information) - - - No - copy requested  Would patient like information on creating a medical advance directive? - - - - No - Patient declined No - Patient declined No - Patient declined    Tobacco Social History   Tobacco Use   Smoking Status Current Every Day Smoker  . Packs/day: 0.50  . Years: 56.00  . Pack years: 28.00  . Types: Cigarettes  Smokeless Tobacco Never Used     Ready to quit: Not Answered Counseling given: Not Answered   Clinical Intake:  Pre-visit preparation completed: Yes  Pain : No/denies pain     Nutritional Risks: None Diabetes: No  How often do you need to have someone help you when you read instructions, pamphlets, or other written materials from your doctor or pharmacy?: 1 - Never  Interpreter Needed?: No  Information entered by :: Clemetine Marker LPN  Past Medical History:  Diagnosis Date  . Anemia   . 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 CLL (chronic lymphocytic leukemia) ([place])   . Itching   . Vitamin B 12 deficiency    Past Surgical History:  Procedure Laterality Date  . breeast mass removed  1988   benign   Family History  Family history unknown: Yes   Social History   Socioeconomic History  . Marital status: Divorced    Spouse name: Not on file  . Number of children: 2  . Years of education: Not on file  . Highest education level: 9th grade  Occupational History  . Occupation: Retired  Tobacco Use  . Smoking status: Current Every Day Smoker    Packs/day: 0.50    Years: 56.00    Pack years: 28.00    Types: Cigarettes  . Smokeless tobacco: Never Used  Substance and Sexual Activity  . Alcohol use: Yes  Alcohol/week: 1.0 standard drinks    Types: 1 Glasses of wine per week    Comment: occassional  . Drug use: No  . Sexual activity: Not Currently  Other Topics Concern  . Not on file  Social History Narrative   Pt lives alone.    Social Determinants of Health   Financial Resource Strain: High Risk  . Difficulty of Paying Living Expenses: Hard  Food Insecurity: No Food Insecurity  . Worried About Charity fundraiser in the Last Year: Never true  . Ran Out of Food in the Last Year: Never true  Transportation Needs: No  Transportation Needs  . Lack of Transportation (Medical): No  . Lack of Transportation (Non-Medical): No  Physical Activity: Inactive  . Days of Exercise per Week: 0 days  . Minutes of Exercise per Session: 0 min  Stress: No Stress Concern Present  . Feeling of Stress : Only a little  Social Connections: Unknown  . Frequency of Communication with Friends and Family: Patient refused  . Frequency of Social Gatherings with Friends and Family: Patient refused  . Attends Religious Services: Patient refused  . Active Member of Clubs or Organizations: Patient refused  . Attends Archivist Meetings: Patient refused  . Marital Status: Divorced    Outpatient Encounter Medications as of 11/04/2019  Medication Sig  . folic acid (FOLVITE) 865 MCG tablet Take 800 mcg by mouth 3 (three) times a week.   . Ibuprofen 200 MG CAPS Take 1 capsule by mouth as needed (PAIN).  . IMBRUVICA 420 MG TABS TAKE 1 TABLET BY MOUTH ONCE DAILY  . vitamin B-12 (CYANOCOBALAMIN) 1000 MCG tablet Take 1,000 mcg by mouth daily.   No facility-administered encounter medications on file as of 11/04/2019.    Activities of Daily Living In your present state of health, do you have any difficulty performing the following activities: 11/04/2019  Hearing? N  Comment declines hearing aids  Vision? N  Difficulty concentrating or making decisions? N  Walking or climbing stairs? N  Dressing or bathing? N  Doing errands, shopping? N  Preparing Food and eating ? N  Using the Toilet? N  In the past six months, have you accidently leaked urine? Y  Do you have problems with loss of bowel control? N  Managing your Medications? N  Managing your Finances? N  Housekeeping or managing your Housekeeping? N  Some recent data might be hidden    Patient Care Team: Juline Patch, MD as PCP - General (Family Medicine) Earnestine Leys, MD (Specialist) Cammie Sickle, MD as Consulting Physician (Internal Medicine)     Assessment:   This is a routine wellness examination for Maureen [name].  Exercise Activities and Dietary recommendations Current Exercise Habits: The patient does not participate in regular exercise at present, Exercise limited by: None identified  Goals    . DIET - INCREASE WATER INTAKE     Recommend to drink at least 6-8 8oz glasses of water per day.       Fall Risk Fall Risk  11/04/2019 12/18/2017 12/17/2016 03/20/2016  Falls in the past year? 0 Yes No No  Comment - tripped over cart at grocery store - -  Number falls in past yr: 0 1 - -  Injury with Fall? 0 Yes - -  Comment - fractured L patella - -  Risk Factor Category  - High Fall Risk - -  Risk for fall due to : No Fall Risks Other (Comment);Impaired vision - -  Risk for fall due to: Comment - fatigue; wears eyeglasses - -  Follow up Falls prevention discussed Falls evaluation completed;Education provided;Falls prevention discussed - -   FALL RISK PREVENTION PERTAINING TO THE HOME:  Any stairs in or around the home? Yes  If so, do they handrails? Yes   Home free of loose throw rugs in walkways, pet beds, electrical cords, etc? Yes  Adequate lighting in your home to reduce risk of falls? Yes   ASSISTIVE DEVICES UTILIZED TO PREVENT FALLS:  Life alert? No  Use of a cane, walker or w/c? No  Grab bars in the bathroom? No  Shower chair or bench in shower? Yes  Elevated toilet seat or a handicapped toilet? No   DME ORDERS:  DME order needed?  No   TIMED UP AND GO:  Was the test performed? No . Telephonic visit.   Education: Fall risk prevention has been discussed.  Intervention(s) required? Yes  - needs grab bars in shower, referral sent to C3 team.    Depression Screen PHQ 2/9 Scores 11/04/2019 12/18/2017 12/17/2016 03/20/2016  PHQ - 2 Score 0 0 0 1  PHQ- 9 Score - 0 - -     Cognitive Function     6CIT Screen 11/04/2019 12/18/2017 12/17/2016  What Year? 0 points 0 points 0 points  What month? 0 points 0 points 0 points  What  time? 0 points 3 points 0 points  Count back from 20 0 points 0 points 0 points  Months in reverse 2 points 0 points 0 points  Repeat phrase 0 points 0 points 2 points  Total Score 2 3 2     Immunization History  Administered Date(s) Administered  . Influenza, High Dose Seasonal PF 04/01/2017, 03/31/2018, 04/05/2019  . Influenza,inj,Quad PF,6+ Mos 04/07/2015  . Influenza-Unspecified 04/15/2014, 03/31/2018, 04/05/2019  . PFIZER SARS-COV-2 Vaccination 10/20/2019  . Pneumococcal Conjugate-13 03/30/2017  . Pneumococcal-Unspecified 04/06/2016    Qualifies for Shingles Vaccine?No    Tdap: Although this vaccine is not a covered service during a Wellness Exam, does the patient still wish to receive this vaccine today?  No .  Education has been provided regarding the importance of this vaccine. Advised may receive this vaccine at local pharmacy or Health Dept. Aware to provide a copy of the vaccination record if obtained from local pharmacy or Health Dept. Verbalized acceptance and understanding.  Flu Vaccine: Up to date  Pneumococcal Vaccine: Up to date   Screening Tests Health Maintenance  Topic Date Due  . DEXA SCAN  Never done  . COVID-19 Vaccine (2 - Pfizer 2-dose series) 11/10/2019  . INFLUENZA VACCINE  02/14/2020  . PNA vac Low Risk Adult  Completed  . TETANUS/TDAP  Discontinued    Cancer Screenings:  Colorectal Screening: No longer required.   Mammogram: Completed 2012.  No longer required.   Bone Density: No longer required.  Lung Cancer Screening: (Low Dose CT Chest recommended if Age 58-80 years, 30 pack-year currently smoking OR have quit w/in 15years.) does not qualify.   Additional Screening:  Hepatitis C Screening: no longer required  Vision Screening: Recommended annual ophthalmology exams for early detection of glaucoma and other disorders of the eye. Is the patient up to date with their annual eye exam?  No - postponed due to Covid Who is the provider or  what is the name of the office in which the pt attends annual eye exams? Dr. Wyatt Portela   Dental Screening: Recommended annual dental exams for proper oral hygiene  Community Resource Referral:  CRR required this visit?  No       Plan:    I have personally reviewed and addressed the Medicare Annual Wellness questionnaire and have noted the following in the patient's chart:  A. Medical and social history B. Use of alcohol, tobacco or illicit drugs  C. Current medications and supplements D. Functional ability and status E.  Nutritional status F.  Physical activity G. Advance directives H. List of other physicians I.  Hospitalizations, surgeries, and ER visits in previous 12 months J.  [place] such as hearing and vision if needed, cognitive and depression L. Referrals and appointments   In addition, I have reviewed and discussed with patient certain preventive protocols, quality metrics, and best practice recommendations. A written personalized care plan for preventive services as well as general preventive health recommendations were provided to patient.   Signed,  Clemetine Marker, LPN Nurse Health Advisor   Nurse Notes: none

## 2019-11-13 ENCOUNTER — Telehealth: Payer: Self-pay | Admitting: Family Medicine

## 2019-11-13 NOTE — Telephone Encounter (Signed)
Email to [facility]   From: Jill Alexanders [facility])  Sent: Friday, November 13, 2019 5:01 PM To: Johnn Hai C @[email or web address]> Subject: Secure: [place] [facility] -Tishia A. Musil  Anderson Malta, Please see attached referral form for Ms. Ellisyn Icenhower.  Her monthly SSI is $1170 Let me know if you need anything further,  [place] . [email or web address]  [phone]
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
## 2019-11-17 NOTE — Telephone Encounter (Signed)
Email from [place] at [employer]  From: Johnn Hai C @[email or web address]>  Sent: Tuesday, Nov 17, 2019 2:46 PM To: Jill Alexanders [facility]) @[email or web address] .[email or web address]> Subject: RE: [External] Secure: [place] [facility]ocational [facility] -Jennessy A. Warne  This message was sent securely using [employer]    Thanks for the referral Curt Bears.  Gerline Legacy. Carmon Ginsberg Associate I [place] Division of [place] Department of Health and Human Services      Help protect your family and neighbors from COVID-19.  Know the 3 Ws. Wear. Wait. Wash.  #StayStrong[place] and get the latest at [email or web address].   [phone] or [phone]    office  [phone]    direct line [phone]    fax [email or web address]   [address], [place] [address]   __________________________________________________________   Unauthorized disclosure of juvenile, health, legally privileged, or otherwise confidential information, including confidential information relating to an ongoing State procurement effort, is prohibited by law. If you have received this e-mail in error, please notify the sender immediately and delete all records of this e-mail.  From: Jill Alexanders [facility]) @[email or web address]>  Sent: Friday, November 13, 2019 5:01 PM To: Johnn Hai C @[email or web address] .[email or web address]> Subject: [External] Secure:  Division of Vocational Rehabilitation -Xoie A. Hopf[place]perger  CAUTION: External email. Do not click links or open attachments unless you verify. Send all suspicious email as an attachment to Report Spam.  This message was sent securely by [facility].         Anderson Malta, Please see attached referral form for Ms. Gwynevere Lizana.  Her monthly SSI is $1170 Let me know if you need anything further,   [place] . [email or web address]   [phone]        NOTICE: This message may contain confidential information intended only for the recipient. If you have received this communication in error, please notify the sender immediately by replying to the message and deleting it from your computer.   This message was secured by [employer].   This message was secured by [employer]. To reach [place], go to: [email or web address]    Email correspondence to and from this address is subject to the [employer] and may be disclosed to third parties by an authorized [employer]. Unauthorized disclosure of juvenile, health, legally privileged, or otherwise confidential information, including confidential information relating to an ongoing Press photographer effort, is prohibited by Sports coach. If you have received this email in error, please notify the sender immediately and delete all records of this email.    This message was secured by Zix.

## 2019-11-18 ENCOUNTER — Inpatient Hospital Stay: Payer: PPO

## 2019-11-25 ENCOUNTER — Other Ambulatory Visit: Payer: Self-pay

## 2019-11-25 ENCOUNTER — Telehealth: Payer: Self-pay | Admitting: *Deleted

## 2019-11-25 ENCOUNTER — Inpatient Hospital Stay: Payer: PPO | Attending: Hematology and Oncology

## 2019-11-25 DIAGNOSIS — C9110 Chronic lymphocytic leukemia of B-cell type not having achieved remission: Secondary | ICD-10-CM | POA: Insufficient documentation

## 2019-11-25 DIAGNOSIS — E538 Deficiency of other specified B group vitamins: Secondary | ICD-10-CM

## 2019-11-25 DIAGNOSIS — D696 Thrombocytopenia, unspecified: Secondary | ICD-10-CM

## 2019-11-25 LAB — COMPREHENSIVE METABOLIC PANEL
ALT: 12 U/L (ref 0–44)
AST: 18 U/L (ref 15–41)
Albumin: 4.6 g/dL (ref 3.5–5.0)
Alkaline Phosphatase: 43 U/L (ref 38–126)
Anion gap: 9 (ref 5–15)
BUN: 14 mg/dL (ref 8–23)
CO2: 26 mmol/L (ref 22–32)
Calcium: 8.8 mg/dL — ABNORMAL LOW (ref 8.9–10.3)
Chloride: 106 mmol/L (ref 98–111)
Creatinine, Ser: 0.66 mg/dL (ref 0.44–1.00)
GFR calc Af Amer: >60 mL/min (ref >60)
GFR calc non Af Amer: >60 mL/min (ref >60)
Glucose, Bld: 91 mg/dL (ref 70–99)
Potassium: 4.7 mmol/L (ref 3.5–5.1)
Sodium: 141 mmol/L (ref 135–145)
Total Bilirubin: 0.8 mg/dL (ref 0.3–1.2)
Total Protein: 6.4 g/dL — ABNORMAL LOW (ref 6.5–8.1)

## 2019-11-25 LAB — CBC WITH DIFFERENTIAL/PLATELET
Abs Immature Granulocytes: 0.08 10*3/uL — ABNORMAL HIGH (ref 0.00–0.07)
Basophils Absolute: 0 10*3/uL (ref 0.0–0.1)
Basophils Relative: 0 %
Eosinophils Absolute: 0.1 10*3/uL (ref 0.0–0.5)
Eosinophils Relative: 0 %
HCT: 41.8 % (ref 36.0–46.0)
Hemoglobin: 12.4 g/dL (ref 12.0–15.0)
Immature Granulocytes: 0 %
Lymphocytes Relative: 95 %
Lymphs Abs: 70.5 10*3/uL — ABNORMAL HIGH (ref 0.7–4.0)
MCH: 28.1 pg (ref 26.0–34.0)
MCHC: 29.7 g/dL — ABNORMAL LOW (ref 30.0–36.0)
MCV: 94.8 fL (ref 80.0–100.0)
Monocytes Absolute: 0.4 10*3/uL (ref 0.1–1.0)
Monocytes Relative: 1 %
Neutro Abs: 3.2 10*3/uL (ref 1.7–7.7)
Neutrophils Relative %: 4 %
Platelets: 93 10*3/uL — ABNORMAL LOW (ref 150–400)
RBC Morphology: NONE SEEN
RBC: 4.41 MIL/uL (ref 3.87–5.11)
RDW: 14.7 % (ref 11.5–15.5)
WBC: 74.2 10*3/uL (ref 4.0–10.5)
nRBC: 0 % (ref 0.0–0.2)

## 2019-11-25 NOTE — Telephone Encounter (Signed)
Call from [place] in [place] lab with a critical result; WBC is 74.2.(READ BACK)  Dr Mike Gip notified of result.

## 2019-12-07 ENCOUNTER — Telehealth: Payer: Self-pay | Admitting: Family Medicine

## 2019-12-07 NOTE — Telephone Encounter (Signed)
Email to [facility]  From: Jill Alexanders [facility])  Sent: Monday, Dec 07, 2019 2:15 PM To: Burnett Harry [email or web address]) [email or web address]> Subject: ECC Application - Maureen [name]- 12/07/19  Good Morning Maureen [name], I have completed the application for Maureen [name], she is a pt at [facility]. Could you bring the gift card to that clinic, if not the pt can pick up from you in [place]. Thank you as always for your help!  [place] . [email or web address]  [phone]
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
## 2019-12-08 NOTE — Telephone Encounter (Signed)
Phone call with pt that [place] card will be available for her to pick up at [facility] Thursday afternoon. Shelby Matt[place] with [facility] will leave with Karin Golden at reception.

## 2019-12-08 NOTE — Telephone Encounter (Signed)
Application approved by [place] with Elease Etienne    From: Jill Alexanders [facility])  Sent: Tuesday, Dec 08, 2019 9:00 AM To: Celso Amy, [place] @[email or web address]>; Crater, Jack [email or web address]> Cc: Burnett Harry @[email or web address]> Subject: RE: SECURE: Patient Request- Connection  Thank you, I will let her know when I call her with time frame for pick up of her grocery gift card. Appreciate you all! She is such a sweet woman and will be so grateful for any help she can get.    From: Celso Amy, [place] @[email or web address]>  Sent: Tuesday, Dec 08, 2019 8:11 AM To: Elease Etienne @[email or web address]> Cc: Burnett Harry @[email or web address]>; Jill Alexanders [facility]) @[email or web address] .[email or web address]> Subject: Re: SECURE: Patient Request- Connection  That works for me! Thanks, Barnabas Lister.   [place] Wood  [place]  [facility]  Program Manager Direct Dial: [phone]  Fax: [phone]  Website: [email or web address]  The purpose of the [facility] is to seek ways to further the Hospital's mission to improve the health of the citizens of [facility] and its surrounding communities.   From: Elease Etienne @[email or web address] .[email or web address]> Sent: Tuesday, Dec 08, 2019 7:24 AM To: Celso Amy, [place] @[email or web address]> Subject: RE: SECURE: Patient Request- Connection    We have helped Ms Munger in the past and I'm sure we can help, again.  I am about 2 weeks booked trying to reach out to the new referrals I've received in the last week, so I may not be able to speak with her immediately. Thanks  Elease Etienne ,CSW   From: Celso Amy, [place] @[email or web address]>  Sent: Monday, Dec 07, 2019 2:39 PM To: Jill Alexanders [facility]) @[email or web address]>; Crater, Jack  @ .[email or web address]> Cc: Burnett Harry @ .[email or web address]> Subject: SECURE: Patient Request- Connection   Hey Curt Bears and Barnabas Lister, I'm connecting you two on this patient.   Curt Bears, We will get Ms. Millhouse a gift card.   Can she pick up at our office?  We will let you know when it's ready.   Since she is a cancer patient, Barnabas Lister can continue to help her as long as she is receiving cancer treatment ([place] is a local hub of our cancer center in [place]). Please have her call him at [phone].   Thanks you two!!!            Martinique Wood  [facility] Manager Direct Dial: [phone]  Fax: [phone]  Website: [email or web address]  The purpose of the [facility] is to seek ways to further the Hospital's mission to improve the health of the citizens of [facility] and its surrounding communities.

## 2019-12-16 ENCOUNTER — Inpatient Hospital Stay: Payer: PPO | Attending: Hematology and Oncology

## 2019-12-16 ENCOUNTER — Other Ambulatory Visit: Payer: Self-pay

## 2019-12-16 DIAGNOSIS — I714 Abdominal aortic aneurysm, without rupture: Secondary | ICD-10-CM | POA: Insufficient documentation

## 2019-12-16 DIAGNOSIS — R161 Splenomegaly, not elsewhere classified: Secondary | ICD-10-CM | POA: Diagnosis not present

## 2019-12-16 DIAGNOSIS — R590 Localized enlarged lymph nodes: Secondary | ICD-10-CM | POA: Diagnosis not present

## 2019-12-16 DIAGNOSIS — Z9221 Personal history of antineoplastic chemotherapy: Secondary | ICD-10-CM | POA: Insufficient documentation

## 2019-12-16 DIAGNOSIS — F17210 Nicotine dependence, cigarettes, uncomplicated: Secondary | ICD-10-CM | POA: Insufficient documentation

## 2019-12-16 DIAGNOSIS — D696 Thrombocytopenia, unspecified: Secondary | ICD-10-CM

## 2019-12-16 DIAGNOSIS — R2681 Unsteadiness on feet: Secondary | ICD-10-CM | POA: Diagnosis not present

## 2019-12-16 DIAGNOSIS — L309 Dermatitis, unspecified: Secondary | ICD-10-CM | POA: Diagnosis not present

## 2019-12-16 DIAGNOSIS — E538 Deficiency of other specified B group vitamins: Secondary | ICD-10-CM | POA: Diagnosis not present

## 2019-12-16 DIAGNOSIS — C9110 Chronic lymphocytic leukemia of B-cell type not having achieved remission: Secondary | ICD-10-CM | POA: Diagnosis not present

## 2019-12-16 LAB — COMPREHENSIVE METABOLIC PANEL
ALT: 13 U/L (ref 0–44)
AST: 17 U/L (ref 15–41)
Albumin: 4.2 g/dL (ref 3.5–5.0)
Alkaline Phosphatase: 47 U/L (ref 38–126)
Anion gap: 7 (ref 5–15)
BUN: 18 mg/dL (ref 8–23)
CO2: 26 mmol/L (ref 22–32)
Calcium: 8.9 mg/dL (ref 8.9–10.3)
Chloride: 109 mmol/L (ref 98–111)
Creatinine, Ser: 0.65 mg/dL (ref 0.44–1.00)
GFR calc Af Amer: >60 mL/min (ref >60)
GFR calc non Af Amer: >60 mL/min (ref >60)
Glucose, Bld: 103 mg/dL — ABNORMAL HIGH (ref 70–99)
Potassium: 4.8 mmol/L (ref 3.5–5.1)
Sodium: 142 mmol/L (ref 135–145)
Total Bilirubin: 0.6 mg/dL (ref 0.3–1.2)
Total Protein: 6.1 g/dL — ABNORMAL LOW (ref 6.5–8.1)

## 2019-12-16 LAB — CBC WITH DIFFERENTIAL/PLATELET
Abs Immature Granulocytes: 0.15 10*3/uL — ABNORMAL HIGH (ref 0.00–0.07)
Basophils Absolute: 0 10*3/uL (ref 0.0–0.1)
Basophils Relative: 0 %
Eosinophils Absolute: 0 10*3/uL (ref 0.0–0.5)
Eosinophils Relative: 0 %
HCT: 42.6 % (ref 36.0–46.0)
Hemoglobin: 12.6 g/dL (ref 12.0–15.0)
Immature Granulocytes: 0 %
Lymphocytes Relative: 93 %
Lymphs Abs: 73.4 10*3/uL — ABNORMAL HIGH (ref 0.7–4.0)
MCH: 28.2 pg (ref 26.0–34.0)
MCHC: 29.6 g/dL — ABNORMAL LOW (ref 30.0–36.0)
MCV: 95.3 fL (ref 80.0–100.0)
Monocytes Absolute: 0.7 10*3/uL (ref 0.1–1.0)
Monocytes Relative: 1 %
Neutro Abs: 4.6 10*3/uL (ref 1.7–7.7)
Neutrophils Relative %: 6 %
Platelets: 113 10*3/uL — ABNORMAL LOW (ref 150–400)
RBC Morphology: NONE SEEN
RBC: 4.47 MIL/uL (ref 3.87–5.11)
RDW: 14.7 % (ref 11.5–15.5)
WBC: 78.8 10*3/uL (ref 4.0–10.5)
nRBC: 0 % (ref 0.0–0.2)

## 2020-01-11 NOTE — Progress Notes (Signed)
[facility]  [address] [place], [place] [address] Phone: [phone]  Fax: [phone]   Clinic Day:  01/13/2020  Referring physician: Juline Patch, MD  Chief Complaint: Maureen [name] is a 79 y.o. female with recurrent stage IV CLLon ibrutinib who is seen for 3 month assessment   HPI: The patient was last seen in the medical oncology clinic on 10/21/2019. At that time, she felt well.  She denied any fevers, sweats or weight loss.  Exam was unremarkable. Hematocrit was 40.9, hemoglobin 11.9, MCV 98.6, platelets 105,000, WBC 106,900  (ANC 3,500). Calcium was 8.6. Total protein was 6.0 (ANC 3,500).  Labs followed: 11/25/2019: Hematocrit 41.8, hemoglobin 12.4, platelets   93,000, WBC 74,200 (ANC 3,200). Calcium 8.8. Total protein 6.4. 12/16/2019: Hematocrit 42.6, hemoglobin 12.6, platelets 113,000, WBC 78,800 (ANC 4,600). Calcium 8.9. Total protein 6.1.  During the interim, she has been doing well. She reports that she has been a little bit unsteady on her feet. She is eating but her food does not taste good. She drinks a glass of milk daily, which has helped her nails. Her sciatica has improved because she bought a pillow that goes between her knees. The patient has been working in the garden and taking care of her animals.  The patient received her COVID-19 vaccines on 10/20/2019 and 11/10/2019.   Past Medical History:  Diagnosis Date  . Anemia   . CLL (chronic lymphocytic leukemia) ([place]) 2002  . CLL (chronic lymphocytic leukemia) ([place])   . Itching   . Vitamin B 12 deficiency     Past Surgical History:  Procedure Laterality Date  . breeast mass removed  1988   benign    Family History  Family history unknown: Yes    Social History:  reports that she has been smoking cigarettes. She has a 28.00 pack-year smoking history. She has never used smokeless tobacco. She reports current alcohol use of about 1.0 standard drink of alcohol per  week. She reports that she does not use drugs. She is from [place]. 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 Her husband would often do tours of duty in [place].She lives in [place].She has 2 dogs and 4 cats.  The patient is alone today.  Allergies: No Known Allergies  Current Medications: Current Outpatient Medications  Medication Sig Dispense Refill  . folic acid (FOLVITE) 163 MCG tablet Take 800 mcg by mouth 3 (three) times a week.     . vitamin B-12 (CYANOCOBALAMIN) 1000 MCG tablet Take 1,000 mcg by mouth daily.    . Ibuprofen 200 MG CAPS Take 1 capsule by mouth as needed (PAIN). (Patient not taking: Reported on 01/13/2020)    . IMBRUVICA 420 MG TABS TAKE 1 TABLET BY MOUTH ONCE DAILY (Patient not taking: Reported on 01/13/2020) 28 tablet 5   No current facility-administered medications for this visit.    Review of Systems  Constitutional: Positive for weight loss (4 lbs). Negative for chills, diaphoresis, fever and malaise/fatigue.       Doing well.  HENT: Negative.  Negative for congestion, ear pain, nosebleeds, sinus pain, sore throat and tinnitus.   Eyes: Negative.  Negative for blurred vision, double vision and photophobia.  Respiratory: Negative.  Negative for cough, sputum production and shortness of breath.   Cardiovascular: Negative.  Negative for chest pain, palpitations, orthopnea and leg swelling.  Gastrointestinal: Negative.  Negative for abdominal pain, constipation, diarrhea, heartburn, nausea and vomiting.  Eating well, but food does not taste good.  Genitourinary: Negative.  Negative for dysuria, frequency and urgency.  Musculoskeletal: Negative.  Negative for back pain, falls, myalgias and neck pain.  Skin: Negative.  Negative for rash.  Neurological: Positive for sensory change (sciatic nerve, improved). Negative for dizziness, speech change, focal weakness, weakness and headaches.       Unsteady on her feet.  Endo/Heme/Allergies: Negative for  environmental allergies. Does not bruise/bleed easily.  Psychiatric/Behavioral: Negative.  Negative for depression. The patient is not nervous/anxious and does not have insomnia.   All other systems reviewed and are negative.  Performance status (ECOG): 0  Vitals Blood pressure (!) 159/55, pulse 60, temperature (!) 97.4 F (36.3 C), temperature source Tympanic, resp. rate 18, weight 119 lb 0.8 oz (54 kg), SpO2 96 %.   Physical Exam Nursing note reviewed.  Constitutional:      General: She is not in acute distress.    Appearance: She is well-developed. She is not diaphoretic.  HENT:     Head: Normocephalic and atraumatic.     Comments: Pink cap.    Mouth/Throat:     Pharynx: No oropharyngeal exudate.  Eyes:     General: No scleral icterus.    Conjunctiva/sclera: Conjunctivae normal.     Pupils: Pupils are equal, round, and reactive to light.     Comments: Blue eyes.  Neck:     Vascular: No JVD.  Cardiovascular:     Rate and Rhythm: Normal rate and regular rhythm.     Heart sounds: Normal heart sounds. No murmur heard.  No gallop.   Pulmonary:     Effort: Pulmonary effort is normal. No respiratory distress.     Breath sounds: Normal breath sounds. No wheezing or rales.  Abdominal:     General: Bowel sounds are normal. There is no distension.     Palpations: Abdomen is soft. There is no hepatomegaly, splenomegaly or mass.     Tenderness: There is no abdominal tenderness. There is no guarding or rebound.  Musculoskeletal:        General: No tenderness. Normal range of motion.     Cervical back: Normal range of motion and neck supple.  Lymphadenopathy:     Head:     Right side of head: No preauricular, posterior auricular or occipital adenopathy.     Left side of head: No preauricular, posterior auricular or occipital adenopathy.     Cervical: No cervical adenopathy.     Upper Body:     Right upper body: No supraclavicular or axillary adenopathy.     Left upper body: No  supraclavicular or axillary adenopathy.     Lower Body: No right inguinal adenopathy. No left inguinal adenopathy.  Skin:    General: Skin is warm and dry.     Coloration: Skin is not pale.     Findings: No erythema or rash.  Neurological:     Mental Status: She is alert and oriented to person, place, and time.  Psychiatric:        Behavior: Behavior normal.        Thought Content: Thought content normal.        Judgment: Judgment normal.     Appointment on 01/13/2020  Component Date Value Ref Range Status  . LDH 01/13/2020 127  98 - 192 U/L Final   Performed at [facility], [address], [place], [place] [address]  . Sodium 01/13/2020 144  135 - 145 mmol/L Final  .  Potassium 01/13/2020 4.8  3.5 - 5.1 mmol/L Final  . Chloride 01/13/2020 109  98 - 111 mmol/L Final  . CO2 01/13/2020 29  22 - 32 mmol/L Final  . Glucose, Bld 01/13/2020 78  70 - 99 mg/dL Final   Glucose reference range applies only to samples taken after fasting for at least 8 hours.  . BUN 01/13/2020 16  8 - 23 mg/dL Final  . Creatinine, Ser 01/13/2020 0.59  0.44 - 1.00 mg/dL Final  . Calcium 01/13/2020 8.8* 8.9 - 10.3 mg/dL Final  . Total Protein 01/13/2020 6.0* 6.5 - 8.1 g/dL Final  . Albumin 01/13/2020 4.1  3.5 - 5.0 g/dL Final  . AST 01/13/2020 13* 15 - 41 U/L Final  . ALT 01/13/2020 13  0 - 44 U/L Final  . Alkaline Phosphatase 01/13/2020 45  38 - 126 U/L Final  . Total Bilirubin 01/13/2020 0.8  0.3 - 1.2 mg/dL Final  . GFR calc non Af Amer 01/13/2020 >60  >60 mL/min Final  . GFR calc Af Amer 01/13/2020 >60  >60 mL/min Final  . Anion gap 01/13/2020 6  5 - 15 Final   Performed at [facility], [address] [place], [place] [address]  . WBC 01/13/2020 57.8* 4.0 - 10.5 K/uL Final   CANCER CENTER CRITICAL VALUE PROTOCOL  . RBC 01/13/2020 4.20  3.87 - 5.11 MIL/uL Final  . Hemoglobin 01/13/2020 11.9* 12.0 - 15.0 g/dL Final  . HCT 01/13/2020 38.4  36 - 46 % Final  . MCV 01/13/2020  91.4  80.0 - 100.0 fL Final  . MCH 01/13/2020 28.3  26.0 - 34.0 pg Final  . MCHC 01/13/2020 31.0  30.0 - 36.0 g/dL Final  . RDW 01/13/2020 14.7  11.5 - 15.5 % Final  . Platelets 01/13/2020 104* 150 - 400 K/uL Final   Comment: Immature Platelet Fraction may be clinically indicated, consider ordering this additional test [id number]   . nRBC 01/13/2020 0.0  0.0 - 0.2 % Final  . Neutrophils Relative % 01/13/2020 7  % Final  . Neutro Abs 01/13/2020 4.1  1.7 - 7.7 K/uL Final  . Lymphocytes Relative 01/13/2020 92  % Final  . Lymphs Abs 01/13/2020 53.1* 0.7 - 4.0 K/uL Final  . Monocytes Relative 01/13/2020 1  % Final  . Monocytes Absolute 01/13/2020 0.5  0 - 1 K/uL Final  . Eosinophils Relative 01/13/2020 0  % Final  . Eosinophils Absolute 01/13/2020 0.0  0 - 0 K/uL Final  . Basophils Relative 01/13/2020 0  % Final  . Basophils Absolute 01/13/2020 0.0  0 - 0 K/uL Final  . Immature Granulocytes 01/13/2020 0  % Final  . Abs Immature Granulocytes 01/13/2020 0.08* 0.00 - 0.07 K/uL Final   Performed at [facility], [address], [place], [place] [address]    Assessment:  Maureen [name] is a 79 y.o. female with stage IV CLL. She was diagnosed in [place] in 2002. Bone marrowon 03/2008 revealed 90% cellularity with diffuse involvement by CLL. Karyotype was normal. FISHstudies on 08/19/2018 revealed loss of one ATM, homologous deletion of 13q, and normal CCDN1/IGH, chromosome 12, and TP53.  She received FCRx 4 cycles (last 08/06/2008). She had a partial remission. She received single agent Rituxanx 7. Treatment was held secondary to neutropenia.  She received 6 cycles of Gazyva(07/26/2016 - 01/23/2017).  Chest, abdomen, and pelvisCTon 10/17/2017 revealed significant partial treatment response. There was mild axillary, mediastinal, hilar, retroperitoneal, mesenteric and bilateral pelvic adenopathy, all significantly  decreased in the interval. There was mild splenomegaly  (13 cm), significantly decreased. There was a new 3.4 cm masslike focus of hypoenhancement in the posterior upper right kidney, indeterminate. There was a stable 3.1 cm infrarenal abdominal aortic aneurysm  Abdomen and pelvisCTon 02/13/2018 revealed patchy enhancement of the right kidney concerning for pyelonephritis. Additionally there was urothelial thickening and hyperenhancement involving the right renal collecting system and ureter compatible with ascending urinary tract infection. There was similar-appearing abdominal and retroperitoneal adenopathy.  She beganibrutinibon 03/25/2019. She has received 7 units of PRBCsto date (first 04/09/2019; last 06/03/2019). Peak WBCwas 393,300 on 04/29/2019.  She has a history of a skin rashs/p anterior thigh biopsy in 2012. 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 She was treated with a course of doxycycline, topical Clobetasol, and hydroxyzine for itching. She states that she gets the rash on her legs in the summertime.  She was diagnosed with B12 deficiencyin 2012. Shere-started oral B12on 09/24/2018. B12 was 237 on 03/11/202 and570 on 10/21/2018.  She received her COVID vaccine on 10/20/2019 and 11/10/2019.  Symptomatically, she is doing well.  She denies any fevers or sweats.  She has been active.  Oral intake has been less.  Weight is down 4 pounds.  Exam reveals no adenopathy or hepatosplenomegaly.  Plan: 1.   Labs today CBC with diff, CMP, LDH 2. Chronic lymphocytic leukemia (CLL) WBC 106,900 (ALC 102,600) on 10/20/2019.  WBC 57,800 (ALC 53,100) on 01/13/2020.  Clinically,  he continues to do well on ibrutinib Exam reveals no adenopathy or hepatosplenomegaly. Weight is down in and up and appears unrelated to CLL. She denies any fevers or sweats. Ibrutinib began on 03/25/2019. She continues to  tolerate her treatment well. Continue ibrutinib 420 mg a day.  Check counts in 6 weeks 3.   Thrombocytopenia  Platelets 93,000 on 11/25/2019.  Platelets 104,000 on 01/13/2020.  Anticipate normalization of platelet count over the next several months.  Continue to monitor 4.B12 deficiency B12 level was1183on 06/10/2019. Folate was21.4on 06/10/2019. Patient on oral B12 and folate.             Check annually 5.   RTC in 6 weeks for labs (CBC with diff, CMP). 6.   RTC in 3 months for MD assessment and labs (CBC with diff, CMP, LDH).  I discussed the assessment and treatment plan with the patient.  The patient was provided an opportunity to ask questions and all were answered.  The patient agreed with the plan and demonstrated an understanding of the instructions.  The patient was advised to call back if the symptoms worsen or if the condition fails to improve as anticipated.   Lequita Asal, MD, PhD    01/13/2020, 11:03 AM   I, Mirian Mo Tufford, am acting as a Education administrator for Lequita Asal, MD.  I, Larch Way Mike Gip, MD, have reviewed the above documentation for accuracy and completeness, and I agree with the above.

## 2020-01-13 ENCOUNTER — Inpatient Hospital Stay: Payer: PPO | Admitting: Hematology and Oncology

## 2020-01-13 ENCOUNTER — Encounter: Payer: Self-pay | Admitting: Hematology and Oncology

## 2020-01-13 ENCOUNTER — Inpatient Hospital Stay: Payer: PPO

## 2020-01-13 ENCOUNTER — Other Ambulatory Visit: Payer: Self-pay

## 2020-01-13 VITALS — BP 159/55 | HR 60 | Temp 97.4°F | Resp 18 | Wt 119.0 lb

## 2020-01-13 DIAGNOSIS — E538 Deficiency of other specified B group vitamins: Secondary | ICD-10-CM | POA: Diagnosis not present

## 2020-01-13 DIAGNOSIS — D696 Thrombocytopenia, unspecified: Secondary | ICD-10-CM | POA: Diagnosis not present

## 2020-01-13 DIAGNOSIS — C9110 Chronic lymphocytic leukemia of B-cell type not having achieved remission: Secondary | ICD-10-CM

## 2020-01-13 LAB — COMPREHENSIVE METABOLIC PANEL
ALT: 13 U/L (ref 0–44)
AST: 13 U/L — ABNORMAL LOW (ref 15–41)
Albumin: 4.1 g/dL (ref 3.5–5.0)
Alkaline Phosphatase: 45 U/L (ref 38–126)
Anion gap: 6 (ref 5–15)
BUN: 16 mg/dL (ref 8–23)
CO2: 29 mmol/L (ref 22–32)
Calcium: 8.8 mg/dL — ABNORMAL LOW (ref 8.9–10.3)
Chloride: 109 mmol/L (ref 98–111)
Creatinine, Ser: 0.59 mg/dL (ref 0.44–1.00)
GFR calc Af Amer: >60 mL/min (ref >60)
GFR calc non Af Amer: >60 mL/min (ref >60)
Glucose, Bld: 78 mg/dL (ref 70–99)
Potassium: 4.8 mmol/L (ref 3.5–5.1)
Sodium: 144 mmol/L (ref 135–145)
Total Bilirubin: 0.8 mg/dL (ref 0.3–1.2)
Total Protein: 6 g/dL — ABNORMAL LOW (ref 6.5–8.1)

## 2020-01-13 LAB — CBC WITH DIFFERENTIAL/PLATELET
Abs Immature Granulocytes: 0.08 10*3/uL — ABNORMAL HIGH (ref 0.00–0.07)
Basophils Absolute: 0 10*3/uL (ref 0.0–0.1)
Basophils Relative: 0 %
Eosinophils Absolute: 0 10*3/uL (ref 0.0–0.5)
Eosinophils Relative: 0 %
HCT: 38.4 % (ref 36.0–46.0)
Hemoglobin: 11.9 g/dL — ABNORMAL LOW (ref 12.0–15.0)
Immature Granulocytes: 0 %
Lymphocytes Relative: 92 %
Lymphs Abs: 53.1 10*3/uL — ABNORMAL HIGH (ref 0.7–4.0)
MCH: 28.3 pg (ref 26.0–34.0)
MCHC: 31 g/dL (ref 30.0–36.0)
MCV: 91.4 fL (ref 80.0–100.0)
Monocytes Absolute: 0.5 10*3/uL (ref 0.1–1.0)
Monocytes Relative: 1 %
Neutro Abs: 4.1 10*3/uL (ref 1.7–7.7)
Neutrophils Relative %: 7 %
Platelets: 104 10*3/uL — ABNORMAL LOW (ref 150–400)
RBC: 4.2 MIL/uL (ref 3.87–5.11)
RDW: 14.7 % (ref 11.5–15.5)
WBC: 57.8 10*3/uL (ref 4.0–10.5)
nRBC: 0 % (ref 0.0–0.2)

## 2020-01-13 LAB — LACTATE DEHYDROGENASE: LDH: 127 U/L (ref 98–192)

## 2020-01-15 MED FILL — IMBRUVICA 420 MG TAB: 420 | 28 days supply | Qty: 28 | Fill #5

## 2020-02-11 ENCOUNTER — Other Ambulatory Visit (HOSPITAL_COMMUNITY): Payer: Self-pay | Admitting: Hematology and Oncology

## 2020-02-11 ENCOUNTER — Other Ambulatory Visit: Payer: Self-pay | Admitting: Hematology and Oncology

## 2020-02-11 NOTE — Telephone Encounter (Signed)
CBC with Differential Order: [id number] Status:  Edited Result - FINAL Visible to patient:  No (inaccessible in MyChart) Next appt:  02/24/2020 at 10:00 AM in Oncology ([facility]) Dx:  Thrombocytopenia ([place]); CLL (chronic ...  0 Result Notes  Ref Range & Units 4 wk ago 1 mo ago  WBC 4.0 - 10.5 K/uL 57.8High Panic  78.8High Panic CM   Comment: CANCER CENTER CRITICAL VALUE PROTOCOL  RBC 3.87 - 5.11 MIL/uL 4.20  4.47   Hemoglobin 12.0 - 15.0 g/dL 11.9Low  12.6   HCT 36 - 46 % 38.4  42.6   MCV 80.0 - 100.0 fL 91.4  95.3   MCH 26.0 - 34.0 pg 28.3  28.2   MCHC 30.0 - 36.0 g/dL 31.0  29.6Low   RDW 11.5 - 15.5 % 14.7  14.7   Platelets 150 - 400 K/uL 104Low  113Low CM   Comment: Immature Platelet Fraction may be  clinically indicated, consider  ordering this additional test  [id number]   nRBC 0.0 - 0.2 % 0.0  0.0   Neutrophils Relative % % 7  6   Neutro Abs 1.7 - 7.7 K/uL 4.1  4.6   Lymphocytes Relative % 92  93   Lymphs Abs 0.7 - 4.0 K/uL 53.1High  73.4High   Monocytes Relative % 1  1   Monocytes Absolute 0 - 1 K/uL 0.5  0.7   Eosinophils Relative % 0  0   Eosinophils Absolute 0 - 0 K/uL 0.0  0.0   Basophils Relative % 0  0   Basophils Absolute 0 - 0 K/uL 0.0  0.0   Immature Granulocytes % 0  0   Abs Immature Granulocytes 0.00 - 0.07 K/uL 0.08High  0.15High CM   Comment: Performed at [facility], [address] [place], [place] [address]  WBC Morphology   SMUDGE CELLS   RBC Morphology   NO SCHISTOCYTES SEEN   Smear Review   PLATELET COUNT CONFIRMED BY SMEAR   Resulting Agency  [facility]      Specimen Collected: 01/13/20 10:00 Last Resulted: 01/13/20 10:49     Lab Flowsheet   Order Details   View Encounter   Lab and Collection Details   Routing   Result History     CM=Additional comments    Result Care Coordination  Patient Communication  Add Comments Not seen Back to Top      Other Results from  01/13/2020  Lactate dehydrogenase  Status:  Final result Visible to patient:  No (inaccessible in MyChart) Next appt:  02/24/2020 at 10:00 AM in Oncology ([facility]) Dx:  Thrombocytopenia ([place]); CLL (chronic ... Order: [id number]  0 Result Notes  Ref Range & Units 4 wk ago 5 mo ago  LDH 98 - 192 U/L 127  152 CM   Comment: Performed at [facility], [address], [place], [place] [address]  Resulting Agency  [facility]      Specimen Collected: 01/13/20 10:00 Last Resulted: 01/13/20 10:39     Lab Flowsheet   Order Details   View Encounter   Lab and Collection Details   Routing   Result History     CM=Additional comments    Result Care Coordination  Patient Communication  Add Comments Not seen Back to Top        Contains abnormal dataComprehensive metabolic panel  Status:  Final result Visible to patient:  No (inaccessible in MyChart)  Next appt:  02/24/2020 at 10:00 AM in Oncology ([facility]) Dx:  Thrombocytopenia ([place]); CLL (chronic ... Order: [id number]  0 Result Notes  Ref Range & Units 4 wk ago 1 mo ago  Sodium 135 - 145 mmol/L 144  142   Potassium 3.5 - 5.1 mmol/L 4.8  4.8   Chloride 98 - 111 mmol/L 109  109   CO2 22 - 32 mmol/L 29  26   Glucose, Bld 70 - 99 mg/dL 78  103High CM   Comment: Glucose reference range applies only to samples taken after fasting for at least 8 hours.  BUN 8 - 23 mg/dL 16  18   Creatinine, Ser 0.44 - 1.00 mg/dL 0.59  0.65   Calcium 8.9 - 10.3 mg/dL 8.8Low  8.9   Total Protein 6.5 - 8.1 g/dL 6.0Low  6.1Low   Albumin 3.5 - 5.0 g/dL 4.1  4.2   AST 15 - 41 U/L 13Low  17   ALT 0 - 44 U/L 13  13   Alkaline Phosphatase 38 - 126 U/L 45  47   Total Bilirubin 0.3 - 1.2 mg/dL 0.8  0.6   GFR calc non Af Amer >60 mL/min >60  >60   GFR calc Af Amer >60 mL/min >60  >60   Anion gap 5 - 15 6  7  CM   Comment: Performed at [facility], [address] [place], [place] [address]   Resulting Agency  [facility]      Specimen Collected: 01/13/20 10:00 Last Resulted: 01/13/20 10:39

## 2020-02-17 ENCOUNTER — Telehealth: Payer: Self-pay | Admitting: Pharmacy Technician

## 2020-02-17 MED FILL — IMBRUVICA 420 MG TAB: 420 | 28 days supply | Qty: 28 | Fill #0

## 2020-02-17 NOTE — Telephone Encounter (Signed)
Oral Oncology Patient Advocate Encounter   Received fax notification from [facility] that the patients grant would be ending on 03/18/20 and that she could renew her grant.  I applied online and was successful in securing the patient an $8,700 grant from [employer] [facility]) to provide copayment coverage for Imbruvica.  This will keep the out of pocket expense at $0.     The billing information is as follows and has been shared with [place].   Member ID: [id number] Group ID: [id number] RxBin: [id number] Dates of Eligibility: 03/19/20 through 03/18/21  Fund:  Chronic Lymphocytic [place] Patient [place] Phone [phone] Fax [phone] 02/17/2020 2:05 PM

## 2020-02-24 ENCOUNTER — Inpatient Hospital Stay: Payer: PPO | Attending: Hematology and Oncology

## 2020-02-24 ENCOUNTER — Ambulatory Visit (INDEPENDENT_AMBULATORY_CARE_PROVIDER_SITE_OTHER): Payer: PPO | Admitting: Family Medicine

## 2020-02-24 ENCOUNTER — Other Ambulatory Visit: Payer: Self-pay

## 2020-02-24 ENCOUNTER — Encounter: Payer: Self-pay | Admitting: Family Medicine

## 2020-02-24 ENCOUNTER — Ambulatory Visit
Admission: EM | Admit: 2020-02-24 | Discharge: 2020-02-24 | Disposition: A | Discharge status: Home / Self Care | Payer: PPO | Attending: Emergency Medicine | Admitting: Emergency Medicine

## 2020-02-24 ENCOUNTER — Ambulatory Visit (INDEPENDENT_AMBULATORY_CARE_PROVIDER_SITE_OTHER): Payer: PPO

## 2020-02-24 VITALS — BP 142/74 | HR 79 | Ht 67.0 in | Wt 122.0 lb

## 2020-02-24 DIAGNOSIS — D84821 Immunodeficiency due to drugs: Secondary | ICD-10-CM

## 2020-02-24 DIAGNOSIS — C9110 Chronic lymphocytic leukemia of B-cell type not having achieved remission: Secondary | ICD-10-CM | POA: Diagnosis not present

## 2020-02-24 DIAGNOSIS — S99921A Unspecified injury of right foot, initial encounter: Secondary | ICD-10-CM

## 2020-02-24 DIAGNOSIS — L0291 Cutaneous abscess, unspecified: Secondary | ICD-10-CM | POA: Diagnosis not present

## 2020-02-24 DIAGNOSIS — S90121A Contusion of right lesser toe(s) without damage to nail, initial encounter: Secondary | ICD-10-CM

## 2020-02-24 DIAGNOSIS — L03031 Cellulitis of right toe: Secondary | ICD-10-CM | POA: Diagnosis present

## 2020-02-24 DIAGNOSIS — Z23 Encounter for immunization: Secondary | ICD-10-CM | POA: Diagnosis not present

## 2020-02-24 DIAGNOSIS — S99911A Unspecified injury of right ankle, initial encounter: Secondary | ICD-10-CM | POA: Diagnosis not present

## 2020-02-24 DIAGNOSIS — Z79899 Other long term (current) drug therapy: Secondary | ICD-10-CM | POA: Diagnosis not present

## 2020-02-24 LAB — CBC WITH DIFFERENTIAL/PLATELET
Abs Immature Granulocytes: 0.18 10*3/uL — ABNORMAL HIGH (ref 0.00–0.07)
Basophils Absolute: 0 10*3/uL (ref 0.0–0.1)
Basophils Relative: 0 %
Eosinophils Absolute: 0 10*3/uL (ref 0.0–0.5)
Eosinophils Relative: 0 %
HCT: 39.5 % (ref 36.0–46.0)
Hemoglobin: 11.9 g/dL — ABNORMAL LOW (ref 12.0–15.0)
Immature Granulocytes: 0 %
Lymphocytes Relative: 85 %
Lymphs Abs: 52.8 10*3/uL — ABNORMAL HIGH (ref 0.7–4.0)
MCH: 27.9 pg (ref 26.0–34.0)
MCHC: 30.1 g/dL (ref 30.0–36.0)
MCV: 92.5 fL (ref 80.0–100.0)
Monocytes Absolute: 0.5 10*3/uL (ref 0.1–1.0)
Monocytes Relative: 1 %
Neutro Abs: 8.3 10*3/uL — ABNORMAL HIGH (ref 1.7–7.7)
Neutrophils Relative %: 14 %
Platelets: 117 10*3/uL — ABNORMAL LOW (ref 150–400)
RBC: 4.27 MIL/uL (ref 3.87–5.11)
RDW: 14.6 % (ref 11.5–15.5)
WBC: 61.8 10*3/uL (ref 4.0–10.5)
nRBC: 0 % (ref 0.0–0.2)

## 2020-02-24 LAB — COMPREHENSIVE METABOLIC PANEL
ALT: 14 U/L (ref 0–44)
AST: 15 U/L (ref 15–41)
Albumin: 4 g/dL (ref 3.5–5.0)
Alkaline Phosphatase: 44 U/L (ref 38–126)
Anion gap: 6 (ref 5–15)
BUN: 21 mg/dL (ref 8–23)
CO2: 25 mmol/L (ref 22–32)
Calcium: 8.3 mg/dL — ABNORMAL LOW (ref 8.9–10.3)
Chloride: 108 mmol/L (ref 98–111)
Creatinine, Ser: 0.66 mg/dL (ref 0.44–1.00)
GFR calc Af Amer: >60 mL/min (ref >60)
GFR calc non Af Amer: >60 mL/min (ref >60)
Glucose, Bld: 104 mg/dL — ABNORMAL HIGH (ref 70–99)
Potassium: 4.1 mmol/L (ref 3.5–5.1)
Sodium: 139 mmol/L (ref 135–145)
Total Bilirubin: 0.9 mg/dL (ref 0.3–1.2)
Total Protein: 5.9 g/dL — ABNORMAL LOW (ref 6.5–8.1)

## 2020-02-24 MED ORDER — MUPIROCIN CALCIUM 2 % EX CREA
1.0000 | TOPICAL_CREAM | Freq: Two times a day (BID) | CUTANEOUS | 0 refills | Status: DC
Start: 2020-02-24 — End: 2021-08-29

## 2020-02-24 MED ORDER — CEPHALEXIN 500 MG PO CAPS: 500.0000 mg | ORAL_CAPSULE | Freq: Three times a day (TID) | ORAL | 0 refills | Status: AC

## 2020-02-24 MED ORDER — TETANUS-DIPHTH-ACELL PERTUSSIS 5-2.5-18.5 LF-MCG/0.5 IM SUSP
0.5000 mL | Freq: Once | INTRAMUSCULAR | Status: AC
  Administered 2020-02-24: 0.5 mL via INTRAMUSCULAR

## 2020-02-24 NOTE — ED Triage Notes (Signed)
Patient in today w/ an injury to her right foot, 3rd toe. Patient states she hit it but doesn't think it's broken approx. A wk ago.

## 2020-02-24 NOTE — Progress Notes (Signed)
Date:  02/24/2020   Name:  Maureen [name]   DOB:  07/21/1940   MRN:  [id number]   Chief Complaint: Toe Pain (Infected R) middle toe. Hit her toe 3 days ago. Throbbing and red. Took ibuprofen and placing her foot in ice water to help the swelling .)  Toe Pain  The incident occurred 5 to 7 days ago (one week ago). The incident occurred at home. Injury mechanism: hit on couch. The pain is present in the right toes (dorsum right toe). The quality of the pain is described as aching. The pain is moderate. The pain has been intermittent since onset. Pertinent negatives include no inability to bear weight, loss of motion, loss of sensation, muscle weakness, numbness or tingling. She reports no foreign bodies present. Nothing aggravates the symptoms. She has tried nothing for the symptoms. The treatment provided moderate relief.    Lab Results  Component Value Date   CREATININE 0.66 02/24/2020   BUN 21 02/24/2020   NA 139 02/24/2020   K 4.1 02/24/2020   CL 108 02/24/2020   CO2 25 02/24/2020   No results found for: CHOL, HDL, LDLCALC, LDLDIRECT, TRIG, CHOLHDL Lab Results  Component Value Date   TSH 1.713 03/25/2019   No results found for: HGBA1C Lab Results  Component Value Date   WBC 61.8 (HH) 02/24/2020   HGB 11.9 (L) 02/24/2020   HCT 39.5 02/24/2020   MCV 92.5 02/24/2020   PLT 117 (L) 02/24/2020   Lab Results  Component Value Date   ALT 14 02/24/2020   AST 15 02/24/2020   ALKPHOS 44 02/24/2020   BILITOT 0.9 02/24/2020     Review of Systems  Constitutional: Negative.  Negative for chills, fatigue, fever and unexpected weight change.  HENT: Negative for congestion, ear discharge, ear pain, rhinorrhea, sinus pressure, sneezing and sore throat.   Eyes: Negative for photophobia, pain, discharge, redness and itching.  Respiratory: Negative for cough, shortness of breath, wheezing and stridor.   Gastrointestinal: Negative for abdominal pain, blood in stool, constipation,  diarrhea, nausea and vomiting.  Endocrine: Negative for cold intolerance, heat intolerance, polydipsia, polyphagia and polyuria.  Genitourinary: Negative for dysuria, flank pain, frequency, hematuria, menstrual problem, pelvic pain, urgency, vaginal bleeding and vaginal discharge.  Musculoskeletal: Negative for arthralgias, back pain and myalgias.  Skin: Negative for rash.  Allergic/Immunologic: Negative for environmental allergies and food allergies.  Neurological: Negative for dizziness, tingling, weakness, light-headedness, numbness and headaches.  Hematological: Negative for adenopathy. Does not bruise/bleed easily.  Psychiatric/Behavioral: Negative for dysphoric mood. The patient is not nervous/anxious.     Patient Active Problem List   Diagnosis Date Noted  . Thrombocytopenia ([place]) 10/20/2019  . Hyperkalemia 03/24/2019  . Anemia 03/24/2019  . Goals of care, counseling/discussion 03/24/2019  . Skin lesions 02/01/2019  . Abnormal iron saturation 10/22/2018  . B12 deficiency 09/24/2018  . Renal mass, right 10/22/2017  . Chemotherapy-induced neutropenia ([place]) 10/14/2017  . 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 breeast mass removed  1988   benign    Social History   Tobacco Use  . Smoking status: Current Every Day Smoker    Packs/day: 0.50    Years: 56.00    Pack years: 28.00    Types: Cigarettes  . Smokeless tobacco: Never Used  Vaping Use  . Vaping Use: Never used  Substance Use Topics  . Alcohol use: Yes  Alcohol/week: 1.0 standard drink    Types: 1 Glasses of wine per week    Comment: occassional  . Drug use: No     Medication list has been reviewed and updated.  Current Meds  Medication Sig  . folic acid (FOLVITE) 322 MCG tablet Take 800 mcg by mouth 3 (three) times a week.   . IMBRUVICA 420 MG TABS TAKE 1 TABLET BY MOUTH ONCE DAILY  . vitamin B-12  (CYANOCOBALAMIN) 1000 MCG tablet Take 1,000 mcg by mouth daily.    PHQ 2/9 Scores 11/04/2019 12/18/2017 12/17/2016 03/20/2016  PHQ - 2 Score 0 0 0 1  PHQ- 9 Score - 0 - -    No flowsheet data found.  BP Readings from Last 3 Encounters:  02/24/20 (!) 190/88  01/13/20 (!) 159/55  10/21/19 140/66    Physical Exam Vitals and nursing note reviewed.  Constitutional:      Appearance: She is well-developed.  HENT:     Head: Normocephalic.     Right Ear: Tympanic membrane, ear canal and external ear normal.     Left Ear: Tympanic membrane, ear canal and external ear normal.     Nose: Nose normal.     Mouth/Throat:     Mouth: Mucous membranes are moist.  Eyes:     General: Lids are everted, no foreign bodies appreciated. No scleral icterus.       Left eye: No foreign body or hordeolum.     Conjunctiva/sclera: Conjunctivae normal.     Right eye: Right conjunctiva is not injected.     Left eye: Left conjunctiva is not injected.     Pupils: Pupils are equal, round, and reactive to light.  Neck:     Thyroid: No thyromegaly.     Vascular: No JVD.     Trachea: No tracheal deviation.  Cardiovascular:     Rate and Rhythm: Normal rate and regular rhythm.     Heart sounds: Normal heart sounds. No murmur heard.  No friction rub. No gallop.   Pulmonary:     Effort: Pulmonary effort is normal. No respiratory distress.     Breath sounds: Normal breath sounds. No wheezing, rhonchi or rales.  Abdominal:     General: Bowel sounds are normal.     Palpations: Abdomen is soft. There is no mass.     Tenderness: There is no abdominal tenderness. There is no guarding or rebound.  Musculoskeletal:        General: No tenderness. Normal range of motion.     Cervical back: Normal range of motion and neck supple.  Lymphadenopathy:     Cervical: No cervical adenopathy.  Skin:    General: Skin is warm.     Findings: Erythema present. No rash.     Comments: Abscess lateral aspect 3rd right toe.    Neurological:     Mental Status: She is alert and oriented to person, place, and time.     Cranial Nerves: No cranial nerve deficit.     Deep Tendon Reflexes: Reflexes normal.  Psychiatric:        Mood and Affect: Mood is not anxious or depressed.     Wt Readings from Last 3 Encounters:  02/24/20 122 lb (55.3 kg)  01/13/20 119 lb 0.8 oz (54 kg)  10/21/19 123 lb 7.3 oz (56 kg)    BP (!) 190/88   Pulse 79   Ht 5\' 7"  (1.702 m)   Wt 122 lb (55.3 kg)   SpO2 99%  BMI 19.11 kg/m   Assessment and Plan:  1. Abscess New onset.  Initial visit.  Status post blunt trauma to right third toe due to hitting it on the couch.  Patient's had continued pain but his creatinine more intense in the last 24 to 48 hours on examination there is a area of abscess which involves also the toenail on the lateral aspect of the third toe.  Patient is on immunosuppression and she is CLL followed by Dr. Mike Gip.  2. Immunosuppression due to drug therapy Followed by Dr. Mike Gip for CLL.  She is on Imbruvica 420 mg daily.  She recently had blood work done downstairs this morning and did not bring it to the attention of oncology.  On evaluation it was noted that there is an abscess and given her CLL and her immunosuppression patient will need to be via evaluated for possible I&D of abscess.  Patient will be referred to urgent care upon discussion to open up the area of abscess.  3. Contusion of third toe of right foot, initial encounter There is bruising noted over the dorsal aspect of the third toe consistent with a contusion but tenderness is minimal over the DIP and distal phalanx.  Primarily tenderness over the abscess.

## 2020-02-24 NOTE — Discharge Instructions (Signed)
Your xray was negative for fracture. Please keep wound clean and covered. Take antibiotic as directed. Follow up with PCP in 3 days for wound check if needed, sooner if worse. Tetanus was updated.

## 2020-02-24 NOTE — ED Provider Notes (Signed)
[facility]    CSN: [id number] Arrival date & time: 02/24/20  1218      History   Chief Complaint Chief Complaint  Patient presents with  . Foot Injury    Right 3rd toe    HPI Maureen [name] is a 79 y.o. female.   79 yr old female sent to UC from PCP office([facility]) to have right 3rd toe drained and xrayed. Pt hit toe last week on something and it has gotten swollen and painful since. Pt also reports clipping her toenails last week prior to this as well. Soaking in ice water daily.   The history is provided by the patient.  Foot Injury Location:  Toe Toe location:  R third toe Pain details:    Quality:  Pressure   Radiates to:  Does not radiate   Severity:  Moderate   Onset quality:  Gradual   Duration:  1 week   Timing:  Constant   Progression:  Worsening Chronicity:  New Dislocation: no   Foreign body present:  No foreign bodies Tetanus status:  Out of date Relieved by:  Nothing Worsened by:  Nothing Ineffective treatments:  Ice Risk factors comment:  Pt is immunocompromised   Past Medical History:  Diagnosis Date  . Anemia   . CLL (chronic lymphocytic leukemia) ([place]) 2002  . CLL (chronic lymphocytic leukemia) ([place])   . Itching   . Vitamin B 12 deficiency     Patient Active Problem List   Diagnosis Date Noted  . Paronychia of third toe of right foot 02/24/2020  . Need for prophylactic vaccination with combined diphtheria-tetanus-pertussis (DTP) vaccine 02/24/2020  . Injury of right toe, initial encounter 02/24/2020  . Thrombocytopenia ([place]) 10/20/2019  . Hyperkalemia 03/24/2019  . Anemia 03/24/2019  . Goals of care, counseling/discussion 03/24/2019  . Skin lesions 02/01/2019  . Abnormal iron saturation 10/22/2018  . B12 deficiency 09/24/2018  . Renal mass, right 10/22/2017  . Chemotherapy-induced neutropenia ([place]) 10/14/2017  . 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 breeast mass removed  1988   benign    OB History   No obstetric history on file.      Home Medications    Prior to Admission medications   Medication Sig Start Date End Date Taking? Authorizing Provider  cephALEXin (KEFLEX) 500 MG capsule Take 1 capsule (500 mg total) by mouth 3 (three) times daily for 7 days. 5/36/14 4/31/54  Defelice, Jeanett Schlein, NP  folic acid (FOLVITE) 008 MCG tablet Take 800 mcg by mouth 3 (three) times a week.     [provider]  IMBRUVICA 420 MG TABS TAKE 1 TABLET BY MOUTH ONCE DAILY 02/11/20   Lequita Asal, MD  mupirocin cream (BACTROBAN) 2 % Apply 1 application topically 2 (two) times daily. 6/76/19   Defelice, Jeanett Schlein, NP  vitamin B-12 (CYANOCOBALAMIN) 1000 MCG tablet Take 1,000 mcg by mouth daily.    [provider]    Family History Family History  Family history unknown: Yes    Social History Social History   Tobacco Use  . Smoking status: Current Every Day Smoker    Packs/day: 0.50    Years: 56.00    Pack years: 28.00    Types: Cigarettes  . Smokeless tobacco: Never Used  Vaping Use  . Vaping Use: Never used  Substance Use Topics  . Alcohol use: Yes    Alcohol/week: 1.0 standard drink  Types: 1 Glasses of wine per week    Comment: occassional  . Drug use: No     Allergies   Patient has no known allergies.   Review of Systems Review of Systems  Skin: Positive for wound.  All other systems reviewed and are negative.    Physical Exam Triage Vital Signs ED Triage Vitals  Enc Vitals Group     BP 02/24/20 1245 (!) 141/74     Pulse Rate 02/24/20 1245 62     Resp 02/24/20 1245 18     Temp 02/24/20 1245 99.2 F (37.3 C)     Temp Source 02/24/20 1245 Oral     SpO2 02/24/20 1245 98 %     Weight 02/24/20 1250 122 lb (55.3 kg)     Height 02/24/20 1250 5\' 7"  (1.702 m)     Head Circumference --      Peak Flow --      Pain Score 02/24/20 1249 7     Pain Loc --      Pain Edu? --      Excl. in [place]?  --    No data found.  Updated Vital Signs BP (!) 141/74 (BP Location: Left Arm)   Pulse 62   Temp 99.2 F (37.3 C) (Oral)   Resp 18   Ht 5\' 7"  (1.702 m)   Wt 122 lb (55.3 kg)   SpO2 98%   BMI 19.11 kg/m    Physical Exam Vitals and nursing note reviewed.  Constitutional:      General: She is not in acute distress.    Appearance: She is well-developed.  HENT:     Head: Normocephalic and atraumatic.  Eyes:     Conjunctiva/sclera: Conjunctivae normal.  Cardiovascular:     Rate and Rhythm: Normal rate and regular rhythm.     Heart sounds: No murmur heard.   Pulmonary:     Effort: Pulmonary effort is normal. No respiratory distress.     Breath sounds: Normal breath sounds.  Abdominal:     Palpations: Abdomen is soft.     Tenderness: There is no abdominal tenderness.  Musculoskeletal:     Cervical back: Neck supple.  Skin:    General: Skin is warm and dry.     Capillary Refill: Capillary refill takes less than 2 seconds.     Comments: +pus pocket surrounding lateral cuticle of right 3rd toe, erythema  Neurological:     Mental Status: She is alert.     GCS: GCS eye subscore is 4. GCS verbal subscore is 5. GCS motor subscore is 6.  Psychiatric:        Attention and Perception: Attention normal.        Mood and Affect: Mood normal.        Speech: Speech normal.        Behavior: Behavior normal. Behavior is cooperative.        Thought Content: Thought content normal.      UC Treatments / Results  Labs (all labs ordered are listed, but only abnormal results are displayed) Labs Reviewed - No data to display  EKG   Radiology DG Toe 3rd Right  Result Date: 02/24/2020 CLINICAL DATA:  Right third toe injury a week ago. EXAM: RIGHT THIRD TOE COMPARISON:  None. FINDINGS: No acute fracture or dislocation. Mild degenerative changes of the third DIP joint. Osteopenia. Soft tissues are unremarkable. IMPRESSION: 1. No acute osseous abnormality. Electronically Signed   By:  Orville Govern.D.  On: 02/24/2020 14:32    Procedures Incision and Drainage  Date/Time: 02/24/2020 2:00 PM Performed by: Tori Milks, NP Authorized by: Tori Milks, NP   Consent:    Consent obtained:  Verbal   Consent given by:  Patient   Risks discussed:  Bleeding, incomplete drainage, infection and pain Location:    Indications for incision and drainage: paronychia.   Size:  1 cm   Location:  Lower extremity   Lower extremity location:  Toe   Toe location:  R third toe Pre-procedure details:    Skin preparation:  Betadine Anesthesia (see MAR for exact dosages):    Anesthesia method:  None Procedure details:    Needle aspiration: yes     Needle size:  22 G   Incision types:  Stab incision   Incision depth:  Dermal   Wound management:  Probed and deloculated   Drainage:  Purulent   Drainage amount:  Copious   Wound treatment:  Wound left open   Packing material: bandaid. Post-procedure details:    Patient tolerance of procedure:  Tolerated well, no immediate complications   (including critical care time)  Medications Ordered in UC Medications  Tdap (BOOSTRIX) injection 0.5 mL (0.5 mLs Intramuscular Given 02/24/20 1429)    Initial Impression / Assessment and Plan / UC Course  I have reviewed the triage vital signs and the nursing notes.  Pertinent labs & imaging results that were available during my care of the patient were reviewed by me and considered in my medical decision making (see chart for details).    Final Clinical Impressions(s) / UC Diagnoses   Final diagnoses:  Paronychia of third toe of right foot  Need for prophylactic vaccination with combined diphtheria-tetanus-pertussis (DTP) vaccine  Injury of right toe, initial encounter     Discharge Instructions     Your xray was negative for fracture. Please keep wound clean and covered. Take antibiotic as directed. Follow up with PCP in 3 days for wound check if needed, sooner if  worse. Tetanus was updated.     ED Prescriptions    Medication Sig Dispense Auth. Provider   cephALEXin (KEFLEX) 500 MG capsule Take 1 capsule (500 mg total) by mouth 3 (three) times daily for 7 days. 21 capsule Defelice, Jeanette, NP   mupirocin cream (BACTROBAN) 2 % Apply 1 application topically 2 (two) times daily. 15 g Defelice, Jeanett Schlein, NP     PDMP not reviewed this encounter.   Tori Milks, NP 37/29/02 1746

## 2020-03-16 MED FILL — IMBRUVICA 420 MG TAB: 420 | 28 days supply | Qty: 28 | Fill #1

## 2020-03-24 ENCOUNTER — Other Ambulatory Visit: Payer: Self-pay

## 2020-03-24 ENCOUNTER — Encounter: Payer: Self-pay | Admitting: Family Medicine

## 2020-03-24 ENCOUNTER — Ambulatory Visit (INDEPENDENT_AMBULATORY_CARE_PROVIDER_SITE_OTHER): Payer: PPO | Admitting: Family Medicine

## 2020-03-24 VITALS — BP 128/70 | HR 68 | Ht 67.0 in | Wt 123.0 lb

## 2020-03-24 DIAGNOSIS — Z9181 History of falling: Secondary | ICD-10-CM | POA: Diagnosis not present

## 2020-03-24 DIAGNOSIS — Z23 Encounter for immunization: Secondary | ICD-10-CM

## 2020-03-24 NOTE — Progress Notes (Signed)
Date:  03/24/2020   Name:  Maureen [name]   DOB:  [birth year]   MRN:  [id number]   Chief Complaint: face to face (form for bathroom reconstruction) and Flu Vaccine  Patient is a 79 year old female who presents for a comprehensive physical exam. The patient reports the following problems: none. Health maintenance has been reviewed up to date.  Patient is also at risk for fall given her CLL, arthritis of shoulder limitation, and overall weakness from age and chemotherapy.   Lab Results  Component Value Date   CREATININE 0.66 02/24/2020   BUN 21 02/24/2020   NA 139 02/24/2020   K 4.1 02/24/2020   CL 108 02/24/2020   CO2 25 02/24/2020   No results found for: CHOL, HDL, LDLCALC, LDLDIRECT, TRIG, CHOLHDL Lab Results  Component Value Date   TSH 1.713 03/25/2019   No results found for: HGBA1C Lab Results  Component Value Date   WBC 61.8 (HH) 02/24/2020   HGB 11.9 (L) 02/24/2020   HCT 39.5 02/24/2020   MCV 92.5 02/24/2020   PLT 117 (L) 02/24/2020   Lab Results  Component Value Date   ALT 14 02/24/2020   AST 15 02/24/2020   ALKPHOS 44 02/24/2020   BILITOT 0.9 02/24/2020     Review of Systems  Constitutional: Negative.  Negative for chills, fatigue and unexpected weight change.  HENT: Negative for congestion, ear discharge, ear pain, rhinorrhea, sinus pressure, sneezing and sore throat.   Eyes: Negative for photophobia, pain, discharge, redness and itching.  Respiratory: Negative for cough, shortness of breath, wheezing and stridor.   Gastrointestinal: Negative for blood in stool, constipation and diarrhea.  Endocrine: Negative for cold intolerance, heat intolerance, polydipsia, polyphagia and polyuria.  Genitourinary: Negative for dysuria, flank pain, frequency, menstrual problem, pelvic pain, urgency, vaginal bleeding and vaginal discharge.  Musculoskeletal: Negative for arthralgias, back pain and myalgias.  Skin: Negative for rash.  Allergic/Immunologic: Negative for  environmental allergies and food allergies.  Neurological: Negative for dizziness, weakness and light-headedness.  Hematological: Negative for adenopathy. Does not bruise/bleed easily.  Psychiatric/Behavioral: Negative for dysphoric mood. The patient is not nervous/anxious.     Patient Active Problem List   Diagnosis Date Noted  . Paronychia of third toe of right foot 02/24/2020  . Need for prophylactic vaccination with combined diphtheria-tetanus-pertussis (DTP) vaccine 02/24/2020  . Injury of right toe, initial encounter 02/24/2020  . Thrombocytopenia ([place]) 10/20/2019  . Hyperkalemia 03/24/2019  . Anemia 03/24/2019  . Goals of care, counseling/discussion 03/24/2019  . Skin lesions 02/01/2019  . Abnormal iron saturation 10/22/2018  . B12 deficiency 09/24/2018  . Renal mass, right 10/22/2017  . Chemotherapy-induced neutropenia ([place]) 10/14/2017  . 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 breeast mass removed  1988   benign    Social History   Tobacco Use  . Smoking status: Current Every Day Smoker    Packs/day: 0.50    Years: 56.00    Pack years: 28.00    Types: Cigarettes  . Smokeless tobacco: Never Used  Vaping Use  . Vaping Use: Never used  Substance Use Topics  . Alcohol use: Yes    Alcohol/week: 1.0 standard drink    Types: 1 Glasses of wine per week    Comment: occassional  . Drug use: No     Medication list has been reviewed and updated.  Current Meds  Medication Sig  .  folic acid (FOLVITE) 179 MCG tablet Take 800 mcg by mouth 3 (three) times a week.   . IMBRUVICA 420 MG TABS TAKE 1 TABLET BY MOUTH ONCE DAILY  . mupirocin cream (BACTROBAN) 2 % Apply 1 application topically 2 (two) times daily.  . vitamin B-12 (CYANOCOBALAMIN) 1000 MCG tablet Take 1,000 mcg by mouth daily.    PHQ 2/9 Scores 11/04/2019 12/18/2017 12/17/2016 03/20/2016  PHQ - 2 Score 0 0 0 1  PHQ- 9 Score  - 0 - -    No flowsheet data found.  BP Readings from Last 3 Encounters:  03/24/20 128/70  02/24/20 (!) 141/74  02/24/20 (!) 142/74    Physical Exam Vitals and nursing note reviewed.  Constitutional:      General: She is not in acute distress.    Appearance: She is not diaphoretic.  HENT:     Head: Normocephalic and atraumatic.     Right Ear: External ear normal.     Left Ear: External ear normal.     Nose: Nose normal.  Eyes:     General:        Right eye: No discharge.        Left eye: No discharge.     Conjunctiva/sclera: Conjunctivae normal.     Pupils: Pupils are equal, round, and reactive to light.  Neck:     Thyroid: No thyromegaly.     Vascular: No JVD.  Cardiovascular:     Rate and Rhythm: Normal rate and regular rhythm.     Heart sounds: Normal heart sounds. No murmur heard.  No friction rub. No gallop.   Pulmonary:     Effort: Pulmonary effort is normal.     Breath sounds: Normal breath sounds.  Abdominal:     General: Bowel sounds are normal.     Palpations: Abdomen is soft. There is no mass.     Tenderness: There is no abdominal tenderness. There is no guarding.  Musculoskeletal:        General: Normal range of motion.     Cervical back: Normal range of motion and neck supple.  Lymphadenopathy:     Cervical: No cervical adenopathy.  Skin:    General: Skin is warm and dry.  Neurological:     Mental Status: She is alert.     Deep Tendon Reflexes: Reflexes are normal and symmetric.     Wt Readings from Last 3 Encounters:  03/24/20 123 lb (55.8 kg)  02/24/20 122 lb (55.3 kg)  02/24/20 122 lb (55.3 kg)    BP 128/70   Pulse 68   Ht 5\' 7"  (1.702 m)   Wt 123 lb (55.8 kg)   BMI 19.26 kg/m   Assessment and Plan: 1. Need for immunization against influenza Discussed and administered. - Flu Vaccine QUAD High Dose(Fluad)  2. At moderate risk for fall Face-to-face.  Chronic.  Moderate risk of fall with instability.  Patient was evaluated and it  was discussed about her fall risk given her limitations due to her arthritis, CLL, and generalized motor weakness due to her age and relatively recent chemotherapy.  Encounter included filling out a form that documented these concerns and suggested that a nonbathtub circumstance for showering would be best or at least a shower stool that patient would also have bars to be able to hold onto to leave bathtub area.

## 2020-04-13 MED FILL — IMBRUVICA 420 MG TAB: 420 | 28 days supply | Qty: 28 | Fill #2

## 2020-04-13 NOTE — Progress Notes (Signed)
[facility]  [address] [place], [place] [address] Phone: [phone]  Fax: [phone]   Clinic Day:  04/14/2020  Referring physician: Juline Patch, MD  Chief Complaint: Maureen [name] is a 79 y.o. female with recurrent stage IV CLLon ibrutinib who is seen for 3 month assessment   HPI: The patient was last seen in the medical oncology clinic on 01/13/2020. At that time, she was doing well.  She denied any fevers or sweats.  She had been active.  Oral intake had been less.  Weight was down 4 pounds.  Exam revealed no adenopathy or hepatosplenomegaly. Hematocrit was 38.4, hemoglobin 11.9, platelets 104,000, WBC 57,800 (ANC 4,100). AST was 13. LDH was 127.  Labs on 02/24/2020 revealed a hematocrit of 39.5, hemoglobin 11.9, platelets 117,000, WBC 61,800 (ANC 8,300). Calcium was 8.3 with an albumen of 4.0.   During the interim, she has been "okay." She states that she had an infection in August. She kicked the edge of her couch without shoes on and a couple of days later, her foot started throbbing and feeling warm. She went to her PCP who sent her to urgent care on 02/24/2020. She had a pus pocket drained. She received a Tetanus vaccine and Keflex.   The patient denies any lumps or bumps. She is eating well and denies early satiety. She is staying active and is able to do all of the things she wants to do. She is unsteady on her feet.  She takes Vitamin Z30 and folic acid. She does not take Calcium.   Past Medical History:  Diagnosis Date  . Anemia   . CLL (chronic lymphocytic leukemia) ([place]) 2002  . CLL (chronic lymphocytic leukemia) ([place])   . Itching   . Vitamin B 12 deficiency     Past Surgical History:  Procedure Laterality Date  . breeast mass removed  1988   benign    Family History  Family history unknown: Yes    Social History:  reports that she has been smoking cigarettes. She has a 28.00 pack-year smoking history. She has  never used smokeless tobacco. She reports current alcohol use of about 1.0 standard drink of alcohol per week. She reports that she does not use drugs. She is from [place]. 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 Her husband would often do tours of duty in [place].She lives in [place].She has 2 dogs and 4 cats.  The patient is alonetoday.  Allergies: No Known Allergies  Current Medications: Current Outpatient Medications  Medication Sig Dispense Refill  . folic acid (FOLVITE) 865 MCG tablet Take 800 mcg by mouth 3 (three) times a week.     . IMBRUVICA 420 MG TABS TAKE 1 TABLET BY MOUTH ONCE DAILY 28 tablet 5  . mupirocin cream (BACTROBAN) 2 % Apply 1 application topically 2 (two) times daily. 15 g 0  . vitamin B-12 (CYANOCOBALAMIN) 1000 MCG tablet Take 1,000 mcg by mouth daily.     No current facility-administered medications for this visit.    Review of Systems  Constitutional: Negative for chills, diaphoresis, fever, malaise/fatigue and weight loss (up 5 lbs).       Doing "okay."  HENT: Negative.  Negative for congestion, ear discharge, ear pain, hearing loss, nosebleeds, sinus pain, sore throat and tinnitus.   Eyes: Negative.  Negative for blurred vision.  Respiratory: Negative.  Negative for cough, hemoptysis, sputum production and shortness of breath.   Cardiovascular: Negative.  Negative for chest pain, palpitations, orthopnea and leg swelling.  Gastrointestinal: Negative.  Negative for abdominal pain, blood in stool, constipation, diarrhea, heartburn, melena, nausea and vomiting.       Eating well. Denies early satiety.  Genitourinary: Negative.  Negative for dysuria, frequency, hematuria and urgency.  Musculoskeletal: Negative.  Negative for back pain, falls, myalgias and neck pain.  Skin: Negative for itching and rash.       Toe infection on 02/24/2020  Neurological: Positive for sensory change (sciatic nerve, improved). Negative for dizziness, speech change,  focal weakness, weakness and headaches.       Unsteady on her feet.  Endo/Heme/Allergies: Negative for environmental allergies. Does not bruise/bleed easily.  Psychiatric/Behavioral: Negative.  Negative for depression and memory loss. The patient is not nervous/anxious and does not have insomnia.   All other systems reviewed and are negative.  Performance status (ECOG): 0  Vitals Blood pressure (!) 152/67, pulse (!) 58, temperature 98.8 F (37.1 C), temperature source Tympanic, weight 124 lb 14.3 oz (56.7 kg), SpO2 97 %.   Physical Exam Nursing note reviewed.  Constitutional:      General: She is not in acute distress.    Appearance: She is well-developed. She is not diaphoretic.  HENT:     Head: Normocephalic and atraumatic.     Comments: Pink cap.    Mouth/Throat:     Pharynx: No oropharyngeal exudate.  Eyes:     General: No scleral icterus.    Conjunctiva/sclera: Conjunctivae normal.     Pupils: Pupils are equal, round, and reactive to light.     Comments: Blue eyes.  Neck:     Vascular: No JVD.  Cardiovascular:     Rate and Rhythm: Normal rate and regular rhythm.     Heart sounds: Normal heart sounds. No murmur heard.  No gallop.   Pulmonary:     Effort: Pulmonary effort is normal. No respiratory distress.     Breath sounds: Normal breath sounds. No wheezing or rales.  Abdominal:     General: Bowel sounds are normal. There is no distension.     Palpations: Abdomen is soft. There is no hepatomegaly, splenomegaly or mass.     Tenderness: There is no abdominal tenderness. There is no guarding or rebound.  Musculoskeletal:        General: No tenderness. Normal range of motion.     Cervical back: Normal range of motion and neck supple.  Lymphadenopathy:     Head:     Right side of head: No preauricular, posterior auricular or occipital adenopathy.     Left side of head: No preauricular, posterior auricular or occipital adenopathy.     Cervical: No cervical adenopathy.      Upper Body:     Right upper body: No supraclavicular or axillary adenopathy.     Left upper body: No supraclavicular or axillary adenopathy.     Lower Body: No right inguinal adenopathy. No left inguinal adenopathy.  Skin:    General: Skin is warm and dry.     Coloration: Skin is not pale.     Findings: No erythema or rash.  Neurological:     Mental Status: She is alert and oriented to person, place, and time.  Psychiatric:        Behavior: Behavior normal.        Thought Content: Thought content normal.        Judgment: Judgment normal.    Appointment on 04/14/2020  Component Date Value Ref  Range Status  . LDH 04/14/2020 125  98 - 192 U/L Final   Performed at [facility], [address] [place], [place] [address]  . Sodium 04/14/2020 142  135 - 145 mmol/L Final  . Potassium 04/14/2020 4.3  3.5 - 5.1 mmol/L Final  . Chloride 04/14/2020 107  98 - 111 mmol/L Final  . CO2 04/14/2020 26  22 - 32 mmol/L Final  . Glucose, Bld 04/14/2020 83  70 - 99 mg/dL Final   Glucose reference range applies only to samples taken after fasting for at least 8 hours.  . BUN 04/14/2020 23  8 - 23 mg/dL Final  . Creatinine, Ser 04/14/2020 0.66  0.44 - 1.00 mg/dL Final  . Calcium 04/14/2020 8.5* 8.9 - 10.3 mg/dL Final  . Total Protein 04/14/2020 6.0* 6.5 - 8.1 g/dL Final  . Albumin 04/14/2020 4.0  3.5 - 5.0 g/dL Final  . AST 04/14/2020 14* 15 - 41 U/L Final  . ALT 04/14/2020 11  0 - 44 U/L Final  . Alkaline Phosphatase 04/14/2020 40  38 - 126 U/L Final  . Total Bilirubin 04/14/2020 0.7  0.3 - 1.2 mg/dL Final  . GFR calc non Af Amer 04/14/2020 >60  >60 mL/min Final  . GFR calc Af Amer 04/14/2020 >60  >60 mL/min Final  . Anion gap 04/14/2020 9  5 - 15 Final   Performed at [facility], [address], [place],  [address]    Assessment:  Maureen [name] is a 79 y.o. female with stage IV CLL. She was diagnosed in [place] in 2002. Bone marrowon 03/2008 revealed 90%  cellularity with diffuse involvement by CLL. Karyotype was normal. FISHstudies on 08/19/2018 revealed loss of one ATM, homologous deletion of 13q, and normal CCDN1/IGH, chromosome 12, and TP53.  She received FCRx 4 cycles (last 08/06/2008). She had a partial remission. She received single agent Rituxanx 7. Treatment was held secondary to neutropenia.  She received 6 cycles of Gazyva(07/26/2016 - 01/23/2017).  Chest, abdomen, and pelvisCTon 10/17/2017 revealed significant partial treatment response. There was mild axillary, mediastinal, hilar, retroperitoneal, mesenteric and bilateral pelvic adenopathy, all significantly decreased in the interval. There was mild splenomegaly (13 cm), significantly decreased. There was a new 3.4 cm masslike focus of hypoenhancement in the posterior upper right kidney, indeterminate. There was a stable 3.1 cm infrarenal abdominal aortic aneurysm  Abdomen and pelvisCTon 02/13/2018 revealed patchy enhancement of the right kidney concerning for pyelonephritis. Additionally there was urothelial thickening and hyperenhancement involving the right renal collecting system and ureter compatible with ascending urinary tract infection. There was similar-appearing abdominal and retroperitoneal adenopathy.  She beganibrutinibon 03/25/2019. She has received 7 units of PRBCsto date (first 04/09/2019; last 06/03/2019). Peak WBCwas 393,300 on 04/29/2019.  She has a history of a skin rashs/p anterior thigh biopsy in 2012. 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 She was treated with a course of doxycycline, topical Clobetasol, and hydroxyzine for itching. She states that she gets the rash on her legs in the summertime.  She was diagnosed with B12 deficiencyin 2012. Shere-started oral B12on 09/24/2018. B12 was 237 on 03/11/202, 570 on 10/21/2018, and 1183 on 06/10/2019.  Folate was 21.4 on  06/10/2019.  She received her COVID vaccine on 10/20/2019 and 11/10/2019.  Symptomatically, she is doing well.  She had an interval toe infection following minor trauma.  Exam reveals no adenopathy or hepatosplenomegaly.  Plan: 1.   Labs today: CBC with diff, CMP, LDH. 2.  Chronic lymphocytic leukemia (CLL) WBC 106,900 (ALC 102,600) on 10/20/2019.  WBC   57,800 (ALC   53,100) on 01/13/2020.  WBC   58,200 (ALC   53,900) on 04/14/2020.  Clinically,  she continues to do well on ibrutinib. She denies any B symptoms.    Exam reveals no adenopathy or hepatosplenomegaly.  She had an interval minor skin infection due to trauma. Ibrutinib began on 03/25/2019.   Continue ibrutinib 420 mg a day. 3.   Thrombocytopenia  Platelets   93,000 on 11/25/2019.  Platelets 104,000 on 01/13/2020.  Platelets 101,000 on 04/14/2020.   Continue to monitor. 4.B12 deficiency B12 level was1183on 06/10/2019. Folate was21.4on 06/10/2019. Patient on oral B12 and folate.             Check annually. 5.   RTC in 2 month for labs (CBC with diff, CMP, B12, folate). 6.   RTC in 4 months for MD assessment and labs (CBC with diff, CMP, LDH).  I discussed the assessment and treatment plan with the patient.  The patient was provided an opportunity to ask questions and all were answered.  The patient agreed with the plan and demonstrated an understanding of the instructions.  The patient was advised to call back if the symptoms worsen or if the condition fails to improve as anticipated.  I provided 16 minutes of face-to-face time during this this encounter and > 50% was spent counseling as documented under my assessment and plan.   Lequita Asal, MD, PhD    04/14/2020, 11:17 AM   I, Evert Kohl, am acting as a Education administrator for Lequita Asal, MD.  I, [place] Mike Gip, MD, have reviewed the above documentation  for accuracy and completeness, and I agree with the above.

## 2020-04-14 ENCOUNTER — Inpatient Hospital Stay: Payer: PPO | Attending: Hematology and Oncology

## 2020-04-14 ENCOUNTER — Inpatient Hospital Stay (HOSPITAL_BASED_OUTPATIENT_CLINIC_OR_DEPARTMENT_OTHER): Payer: PPO | Admitting: Hematology and Oncology

## 2020-04-14 ENCOUNTER — Other Ambulatory Visit: Payer: Self-pay

## 2020-04-14 ENCOUNTER — Encounter: Payer: Self-pay | Admitting: Hematology and Oncology

## 2020-04-14 VITALS — BP 152/67 | HR 58 | Temp 98.8°F | Wt 124.9 lb

## 2020-04-14 DIAGNOSIS — E538 Deficiency of other specified B group vitamins: Secondary | ICD-10-CM | POA: Insufficient documentation

## 2020-04-14 DIAGNOSIS — C9110 Chronic lymphocytic leukemia of B-cell type not having achieved remission: Secondary | ICD-10-CM | POA: Diagnosis not present

## 2020-04-14 DIAGNOSIS — R2681 Unsteadiness on feet: Secondary | ICD-10-CM | POA: Diagnosis not present

## 2020-04-14 DIAGNOSIS — D696 Thrombocytopenia, unspecified: Secondary | ICD-10-CM | POA: Diagnosis not present

## 2020-04-14 DIAGNOSIS — I714 Abdominal aortic aneurysm, without rupture: Secondary | ICD-10-CM | POA: Insufficient documentation

## 2020-04-14 DIAGNOSIS — Z79899 Other long term (current) drug therapy: Secondary | ICD-10-CM | POA: Diagnosis not present

## 2020-04-14 DIAGNOSIS — Z7189 Other specified counseling: Secondary | ICD-10-CM | POA: Diagnosis not present

## 2020-04-14 DIAGNOSIS — F17210 Nicotine dependence, cigarettes, uncomplicated: Secondary | ICD-10-CM | POA: Diagnosis not present

## 2020-04-14 LAB — COMPREHENSIVE METABOLIC PANEL
ALT: 11 U/L (ref 0–44)
AST: 14 U/L — ABNORMAL LOW (ref 15–41)
Albumin: 4 g/dL (ref 3.5–5.0)
Alkaline Phosphatase: 40 U/L (ref 38–126)
Anion gap: 9 (ref 5–15)
BUN: 23 mg/dL (ref 8–23)
CO2: 26 mmol/L (ref 22–32)
Calcium: 8.5 mg/dL — ABNORMAL LOW (ref 8.9–10.3)
Chloride: 107 mmol/L (ref 98–111)
Creatinine, Ser: 0.66 mg/dL (ref 0.44–1.00)
GFR calc Af Amer: >60 mL/min (ref >60)
GFR calc non Af Amer: >60 mL/min (ref >60)
Glucose, Bld: 83 mg/dL (ref 70–99)
Potassium: 4.3 mmol/L (ref 3.5–5.1)
Sodium: 142 mmol/L (ref 135–145)
Total Bilirubin: 0.7 mg/dL (ref 0.3–1.2)
Total Protein: 6 g/dL — ABNORMAL LOW (ref 6.5–8.1)

## 2020-04-14 LAB — CBC WITH DIFFERENTIAL/PLATELET
Abs Immature Granulocytes: 0.09 10*3/uL — ABNORMAL HIGH (ref 0.00–0.07)
Basophils Absolute: 0 10*3/uL (ref 0.0–0.1)
Basophils Relative: 0 %
Eosinophils Absolute: 0.1 10*3/uL (ref 0.0–0.5)
Eosinophils Relative: 0 %
HCT: 39.5 % (ref 36.0–46.0)
Hemoglobin: 11.9 g/dL — ABNORMAL LOW (ref 12.0–15.0)
Immature Granulocytes: 0 %
Lymphocytes Relative: 93 %
Lymphs Abs: 53.9 10*3/uL — ABNORMAL HIGH (ref 0.7–4.0)
MCH: 27.8 pg (ref 26.0–34.0)
MCHC: 30.1 g/dL (ref 30.0–36.0)
MCV: 92.3 fL (ref 80.0–100.0)
Monocytes Absolute: 0.5 10*3/uL (ref 0.1–1.0)
Monocytes Relative: 1 %
Neutro Abs: 3.7 10*3/uL (ref 1.7–7.7)
Neutrophils Relative %: 6 %
Platelets: 101 10*3/uL — ABNORMAL LOW (ref 150–400)
RBC Morphology: NONE SEEN
RBC: 4.28 MIL/uL (ref 3.87–5.11)
RDW: 15.5 % (ref 11.5–15.5)
WBC Morphology: ABNORMAL
WBC: 58.2 10*3/uL (ref 4.0–10.5)
nRBC: 0 % (ref 0.0–0.2)

## 2020-04-14 LAB — LACTATE DEHYDROGENASE: LDH: 125 U/L (ref 98–192)

## 2020-04-14 NOTE — Progress Notes (Signed)
No new changes noted today 

## 2020-04-15 ENCOUNTER — Telehealth: Payer: Self-pay

## 2020-04-15 NOTE — Telephone Encounter (Signed)
-----   Message from Lequita Asal, MD sent at 04/14/2020 12:05 PM EDT ----- Regarding: Please call patient with CBC results  WBC slightly better.  Platelet 101,000.  Continue ibrutinib.  M ----- Message ----- From: Buel Ream, Lab In [place] Sent: 04/14/2020  11:10 AM EDT To: Lequita Asal, MD

## 2020-04-15 NOTE — Telephone Encounter (Signed)
Spoke with the patient to inform her that her WBC slightly better Platelet count 101,000, continue Ibrutinib. The patient was understanding and agreeable.

## 2020-05-12 MED FILL — IMBRUVICA 420 MG TAB: 420 | 28 days supply | Qty: 28 | Fill #3

## 2020-06-06 MED FILL — IMBRUVICA 420 MG TAB: 420 | 28 days supply | Qty: 28 | Fill #4

## 2020-06-13 ENCOUNTER — Other Ambulatory Visit: Payer: Self-pay

## 2020-06-13 DIAGNOSIS — D649 Anemia, unspecified: Secondary | ICD-10-CM

## 2020-06-13 DIAGNOSIS — D696 Thrombocytopenia, unspecified: Secondary | ICD-10-CM

## 2020-06-13 DIAGNOSIS — D509 Iron deficiency anemia, unspecified: Secondary | ICD-10-CM

## 2020-06-13 DIAGNOSIS — E538 Deficiency of other specified B group vitamins: Secondary | ICD-10-CM

## 2020-06-13 DIAGNOSIS — E875 Hyperkalemia: Secondary | ICD-10-CM

## 2020-06-13 DIAGNOSIS — C9110 Chronic lymphocytic leukemia of B-cell type not having achieved remission: Secondary | ICD-10-CM

## 2020-06-14 ENCOUNTER — Inpatient Hospital Stay: Payer: PPO | Attending: Hematology and Oncology

## 2020-06-14 ENCOUNTER — Other Ambulatory Visit: Payer: Self-pay

## 2020-06-14 DIAGNOSIS — D696 Thrombocytopenia, unspecified: Secondary | ICD-10-CM

## 2020-06-14 DIAGNOSIS — C9110 Chronic lymphocytic leukemia of B-cell type not having achieved remission: Secondary | ICD-10-CM | POA: Insufficient documentation

## 2020-06-14 DIAGNOSIS — E875 Hyperkalemia: Secondary | ICD-10-CM

## 2020-06-14 DIAGNOSIS — E538 Deficiency of other specified B group vitamins: Secondary | ICD-10-CM

## 2020-06-14 DIAGNOSIS — D509 Iron deficiency anemia, unspecified: Secondary | ICD-10-CM

## 2020-06-14 LAB — COMPREHENSIVE METABOLIC PANEL
ALT: 11 U/L (ref 0–44)
AST: 14 U/L — ABNORMAL LOW (ref 15–41)
Albumin: 4.7 g/dL (ref 3.5–5.0)
Alkaline Phosphatase: 47 U/L (ref 38–126)
Anion gap: 8 (ref 5–15)
BUN: 18 mg/dL (ref 8–23)
CO2: 29 mmol/L (ref 22–32)
Calcium: 8.8 mg/dL — ABNORMAL LOW (ref 8.9–10.3)
Chloride: 101 mmol/L (ref 98–111)
Creatinine, Ser: 0.68 mg/dL (ref 0.44–1.00)
GFR, Estimated: >60 mL/min (ref >60)
Glucose, Bld: 90 mg/dL (ref 70–99)
Potassium: 4.8 mmol/L (ref 3.5–5.1)
Sodium: 138 mmol/L (ref 135–145)
Total Bilirubin: 0.8 mg/dL (ref 0.3–1.2)
Total Protein: 6.4 g/dL — ABNORMAL LOW (ref 6.5–8.1)

## 2020-06-14 LAB — CBC WITH DIFFERENTIAL/PLATELET
Abs Immature Granulocytes: 0.12 10*3/uL — ABNORMAL HIGH (ref 0.00–0.07)
Basophils Absolute: 0 10*3/uL (ref 0.0–0.1)
Basophils Relative: 0 %
Eosinophils Absolute: 0 10*3/uL (ref 0.0–0.5)
Eosinophils Relative: 0 %
HCT: 43.7 % (ref 36.0–46.0)
Hemoglobin: 13.2 g/dL (ref 12.0–15.0)
Immature Granulocytes: 0 %
Lymphocytes Relative: 91 %
Lymphs Abs: 52 10*3/uL — ABNORMAL HIGH (ref 0.7–4.0)
MCH: 27.5 pg (ref 26.0–34.0)
MCHC: 30.2 g/dL (ref 30.0–36.0)
MCV: 91 fL (ref 80.0–100.0)
Monocytes Absolute: 0.5 10*3/uL (ref 0.1–1.0)
Monocytes Relative: 1 %
Neutro Abs: 4.6 10*3/uL (ref 1.7–7.7)
Neutrophils Relative %: 8 %
Platelets: 142 10*3/uL — ABNORMAL LOW (ref 150–400)
RBC: 4.8 MIL/uL (ref 3.87–5.11)
RDW: 15.4 % (ref 11.5–15.5)
Smear Review: NORMAL
WBC Morphology: ABNORMAL
WBC: 57.3 10*3/uL (ref 4.0–10.5)
nRBC: 0 % (ref 0.0–0.2)

## 2020-06-14 LAB — VITAMIN B12: Vitamin B-12: 1424 pg/mL — ABNORMAL HIGH (ref 180–914)

## 2020-06-14 LAB — FOLATE: Folate: 14.6 ng/mL (ref >5.9)

## 2020-06-15 ENCOUNTER — Telehealth: Payer: Self-pay

## 2020-06-15 ENCOUNTER — Other Ambulatory Visit: Payer: Self-pay

## 2020-06-15 DIAGNOSIS — E538 Deficiency of other specified B group vitamins: Secondary | ICD-10-CM

## 2020-06-15 NOTE — Telephone Encounter (Signed)
Patient aware. Lab order added to next draw.

## 2020-06-15 NOTE — Telephone Encounter (Signed)
-----   Message from Lequita Asal, MD sent at 06/15/2020  4:36 PM EST ----- Regarding: RE: Please call patient.  Please have her take 3 days a week (Mon, Wed, Fri).  Check B12 in 1 month (or next lab draw).  M ----- Message ----- From: Drue Dun, RN Sent: 06/15/2020  12:16 PM EST To: Lequita Asal, MD Subject: RE: Please call patient.                       Vitamin B12 1,01mcg per day. ----- Message ----- From: Lequita Asal, MD Sent: 06/14/2020   5:15 PM EST To: Drue Dun, RN Subject: Please call patient.                            B12 is a little high (higher than it was a year ago).  How much B12 is she taking?  We will likely cut her dose back.  M ----- Message ----- From: Buel Ream, Lab In [place] Sent: 06/14/2020   9:49 AM EST To: Lequita Asal, MD

## 2020-07-05 MED FILL — IMBRUVICA 420 MG TAB: 420 | 28 days supply | Qty: 28 | Fill #5

## 2020-08-01 ENCOUNTER — Other Ambulatory Visit: Payer: Self-pay | Admitting: Hematology and Oncology

## 2020-08-02 ENCOUNTER — Other Ambulatory Visit (HOSPITAL_COMMUNITY): Payer: Self-pay | Admitting: Hematology and Oncology

## 2020-08-02 MED FILL — IMBRUVICA 420 MG TAB: 420 | 28 days supply | Qty: 28 | Fill #0

## 2020-08-10 ENCOUNTER — Other Ambulatory Visit: Payer: Self-pay

## 2020-08-11 NOTE — Progress Notes (Signed)
[facility]  [address], [address] [place],  [address] Phone: [phone]  Fax: [phone]   Clinic Day:  08/15/2020  Referring physician: Juline Patch, MD  Chief Complaint: Maureen [name] is a 80 y.o. female with recurrent stage IV CLLon ibrutinib who is seen for 4 month assessment.  HPI: The patient was last seen in the medical oncology clinic on 04/14/2020. At that time, she was doing well.  She had an interval toe infection following minor trauma.  Exam revealed no adenopathy or hepatosplenomegaly. Hematocrit was 39.5, hemoglobin 11.9, platelets 101,000, WBC 58,200. Calcium was 8.5. Total protein was 6.0. AST was 14. LDH was 125. She continued folate and vitamin B12. She continued ibrutinib 420 mg daily.  Labs on 06/14/2020 revealed a hematocrit of 43.7, hemoglobin 13.2, platelets 142,000, WBC 57,300. Calcium was 8.8. Total protein was 6.4. AST was 14. Vitamin B12 was 1,424 and folate 14.6.  During the interim, she has been "good." Most of her life, she has weighed 120 lbs. She is up 7 lbs today. Her sciatica is stable and is the worst in the mornings. She is unsteady on her feet. She denies early satiety, infections, lumps, or bumps.  She takes calcium 800 mg a day.  She is under stress related to the health of her youngest son, Maureen [name]. He is 75 years old.   Past Medical History:  Diagnosis Date  . Anemia   . CLL (chronic lymphocytic leukemia) ([place]) 2002  . CLL (chronic lymphocytic leukemia) ([place])   . Itching   . Vitamin B 12 deficiency     Past Surgical History:  Procedure Laterality Date  . breeast mass removed  1988   benign    Family History  Family history unknown: Yes    Social History:  reports that she has been smoking cigarettes. She has a 14.00 pack-year smoking history. She has never used smokeless tobacco. She reports current alcohol use of about 1.0 standard drink of alcohol per week. She reports that she does not use  drugs. She is from [place]. 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 The patient is alone today.  Allergies: No Known Allergies  Current Medications: Current Outpatient Medications  Medication Sig Dispense Refill  . folic acid (FOLVITE) 536 MCG tablet Take 800 mcg by mouth 3 (three) times a week.     . IMBRUVICA 420 MG TABS TAKE 1 TABLET BY MOUTH ONCE DAILY 28 tablet 5  . Magnesium 400 MG CAPS Take 400 mg by mouth 3 (three) times daily.    . vitamin B-12 (CYANOCOBALAMIN) 1000 MCG tablet Take 1,000 mcg by mouth. Takes three times a week    . mupirocin cream (BACTROBAN) 2 % Apply 1 application topically 2 (two) times daily. (Patient not taking: Reported on 08/15/2020) 15 g 0   No current facility-administered medications for this visit.    Review of Systems  Constitutional: Negative for chills, diaphoresis, fever, malaise/fatigue and weight loss (up 7 lbs).       Feels "good."  HENT: Negative.  Negative for congestion, ear discharge, ear pain, hearing loss, nosebleeds, sinus pain, sore throat and tinnitus.   Eyes: Negative.  Negative for blurred vision.  Respiratory: Negative.  Negative for cough, hemoptysis, sputum production and shortness of breath.   Cardiovascular: Negative.  Negative for chest pain, palpitations, orthopnea and leg swelling.  Gastrointestinal: Negative.  Negative for abdominal pain, blood in stool, constipation, diarrhea, heartburn, melena, nausea and vomiting.       Eating well. Denies early satiety.  Genitourinary: Negative.  Negative for dysuria, frequency, hematuria and urgency.  Musculoskeletal: Negative.  Negative for back pain, falls, myalgias and neck pain.  Skin: Negative for itching and rash.  Neurological: Positive for sensory change (sciatica). Negative for dizziness, speech change, focal weakness, weakness and headaches.       Unsteady on her  feet.  Endo/Heme/Allergies: Negative for environmental allergies. Does not bruise/bleed easily.  Psychiatric/Behavioral: Negative.  Negative for depression and memory loss. The patient is not nervous/anxious and does not have insomnia.   All other systems reviewed and are negative.  Performance status (ECOG): 0  Vitals Blood pressure (!) 188/72, pulse (!) 58, temperature 98.2 F (36.8 C), temperature source Tympanic, resp. rate 16, weight 131 lb 8.1 oz (59.6 kg), SpO2 94 %.   Physical Exam Nursing note reviewed.  Constitutional:      General: She is not in acute distress.    Appearance: She is well-developed. She is not diaphoretic.  HENT:     Head: Normocephalic and atraumatic.     Comments: Cap.    Mouth/Throat:     Mouth: Mucous membranes are moist.     Pharynx: Oropharynx is clear. No oropharyngeal exudate.  Eyes:     General: No scleral icterus.    Conjunctiva/sclera: Conjunctivae normal.     Pupils: Pupils are equal, round, and reactive to light.     Comments: Blue eyes.  Neck:     Vascular: No JVD.  Cardiovascular:     Rate and Rhythm: Normal rate and regular rhythm.     Heart sounds: Normal heart sounds. No murmur heard. No gallop.   Pulmonary:     Effort: Pulmonary effort is normal. No respiratory distress.     Breath sounds: Normal breath sounds. No wheezing or rales.  Chest:  Breasts:     Right: No axillary adenopathy or supraclavicular adenopathy.     Left: No axillary adenopathy or supraclavicular adenopathy.    Abdominal:     General: Bowel sounds are normal. There is no distension.     Palpations: Abdomen is soft. There is no hepatomegaly, splenomegaly or mass.     Tenderness: There is no abdominal tenderness. There is no guarding or rebound.  Musculoskeletal:        General: No tenderness. Normal range of motion.     Cervical back: Normal range of motion and neck supple.  Lymphadenopathy:     Head:     Right side of head: No preauricular, posterior  auricular or occipital adenopathy.     Left side of head: No preauricular, posterior auricular or occipital adenopathy.     Cervical: No cervical adenopathy.     Upper Body:     Right upper body: No supraclavicular or axillary adenopathy.     Left upper body: No supraclavicular or axillary adenopathy.     Lower Body: No right inguinal adenopathy. No left inguinal adenopathy.  Skin:    General: Skin is warm and dry.     Coloration: Skin is not pale.     Findings: No erythema or rash.  Neurological:     Mental Status: She is alert and oriented to person, place, and time.  Psychiatric:        Behavior: Behavior normal.        Thought Content: Thought content normal.  Judgment: Judgment normal.    Appointment on 08/15/2020  Component Date Value Ref Range Status  . LDH 08/15/2020 135  98 - 192 U/L Final   Performed at [facility], [address], [place], [place] [address]  . Sodium 08/15/2020 137  135 - 145 mmol/L Final  . Potassium 08/15/2020 4.9  3.5 - 5.1 mmol/L Final  . Chloride 08/15/2020 101  98 - 111 mmol/L Final  . CO2 08/15/2020 27  22 - 32 mmol/L Final  . Glucose, Bld 08/15/2020 81  70 - 99 mg/dL Final   Glucose reference range applies only to samples taken after fasting for at least 8 hours.  . BUN 08/15/2020 21  8 - 23 mg/dL Final  . Creatinine, Ser 08/15/2020 0.76  0.44 - 1.00 mg/dL Final  . Calcium 08/15/2020 8.7* 8.9 - 10.3 mg/dL Final  . Total Protein 08/15/2020 6.3* 6.5 - 8.1 g/dL Final  . Albumin 08/15/2020 4.3  3.5 - 5.0 g/dL Final  . AST 08/15/2020 16  15 - 41 U/L Final  . ALT 08/15/2020 12  0 - 44 U/L Final  . Alkaline Phosphatase 08/15/2020 45  38 - 126 U/L Final  . Total Bilirubin 08/15/2020 0.7  0.3 - 1.2 mg/dL Final  . GFR, Estimated 08/15/2020 >60  >60 mL/min Final   Comment: (NOTE) Calculated using the CKD-EPI Creatinine Equation (2021)   . Anion gap 08/15/2020 9  5 - 15 Final   Performed at [facility], [address], [place], [place] [address]  . WBC 08/15/2020 45.6* 4.0 - 10.5 K/uL Final  . RBC 08/15/2020 4.69  3.87 - 5.11 MIL/uL Final  . Hemoglobin 08/15/2020 12.7  12.0 - 15.0 g/dL Final  . HCT 08/15/2020 42.6  36.0 - 46.0 % Final  . MCV 08/15/2020 90.8  80.0 - 100.0 fL Final  . MCH 08/15/2020 27.1  26.0 - 34.0 pg Final  . MCHC 08/15/2020 29.8* 30.0 - 36.0 g/dL Final  . RDW 08/15/2020 14.6  11.5 - 15.5 % Final  . Platelets 08/15/2020 134* 150 - 400 K/uL Final  . nRBC 08/15/2020 0.0  0.0 - 0.2 % Final   Performed at [facility], [address], [place], [place] [address]  . Neutrophils Relative % 08/15/2020 PENDING  % Incomplete  . Neutro Abs 08/15/2020 PENDING  1.7 - 7.7 K/uL Incomplete  . Band Neutrophils 08/15/2020 PENDING  % Incomplete  . Lymphocytes Relative 08/15/2020 PENDING  % Incomplete  . Lymphs Abs 08/15/2020 PENDING  0.7 - 4.0 K/uL Incomplete  . Monocytes Relative 08/15/2020 PENDING  % Incomplete  . Monocytes Absolute 08/15/2020 PENDING  0.1 - 1.0 K/uL Incomplete  . Eosinophils Relative 08/15/2020 PENDING  % Incomplete  . Eosinophils Absolute 08/15/2020 PENDING  0.0 - 0.5 K/uL Incomplete  . Basophils Relative 08/15/2020 PENDING  % Incomplete  . Basophils Absolute 08/15/2020 PENDING  0.0 - 0.1 K/uL Incomplete  . WBC Morphology 08/15/2020 PENDING   Incomplete  . RBC Morphology 08/15/2020 PENDING   Incomplete  . Smear Review 08/15/2020 PENDING   Incomplete  . Other 08/15/2020 PENDING  % Incomplete  . nRBC 08/15/2020 PENDING  0 /100 WBC Incomplete  . Metamyelocytes Relative 08/15/2020 PENDING  % Incomplete  . Myelocytes 08/15/2020 PENDING  % Incomplete  . Promyelocytes Relative 08/15/2020 PENDING  % Incomplete  . Blasts 08/15/2020 PENDING  % Incomplete  . Immature Granulocytes 08/15/2020 PENDING  % Incomplete  . Abs Immature Granulocytes 08/15/2020 PENDING  0.00 -  0.07 K/uL Incomplete    Assessment:  TOYOKO SILOS is a 80 y.o. female with stage IV CLL. She was  diagnosed in [place] in 2002. Bone marrowon 03/2008 revealed 90% cellularity with diffuse involvement by CLL. Karyotype was normal. FISHstudies on 08/19/2018 revealed loss of one ATM, homologous deletion of 13q, and normal CCDN1/IGH, chromosome 12, and TP53.  She received FCRx 4 cycles (last 08/06/2008). She had a partial remission. She received single agent Rituxanx 7. Treatment was held secondary to neutropenia.  She received 6 cycles of Gazyva(07/26/2016 - 01/23/2017).  Chest, abdomen, and pelvisCTon 10/17/2017 revealed significant partial treatment response. There was mild axillary, mediastinal, hilar, retroperitoneal, mesenteric and bilateral pelvic adenopathy, all significantly decreased in the interval. There was mild splenomegaly (13 cm), significantly decreased. There was a new 3.4 cm masslike focus of hypoenhancement in the posterior upper right kidney, indeterminate. There was a stable 3.1 cm infrarenal abdominal aortic aneurysm  Abdomen and pelvisCTon 02/13/2018 revealed patchy enhancement of the right kidney concerning for pyelonephritis. Additionally there was urothelial thickening and hyperenhancement involving the right renal collecting system and ureter compatible with ascending urinary tract infection. There was similar-appearing abdominal and retroperitoneal adenopathy.  She beganibrutinibon 03/25/2019. She has received 7 units of PRBCsto date (first 04/09/2019; last 06/03/2019). Peak WBCwas 393,300 on 04/29/2019.  She has a history of a skin rashs/p anterior thigh biopsy in 2012. 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 She was treated with a course of doxycycline, topical Clobetasol, and hydroxyzine for itching. She states that she gets the rash on her legs in the summertime.  She was diagnosed with B12 deficiencyin 2012. Shere-started oral B12on 09/24/2018. B12 was changed to 3  days a week on 06/15/2020.  B12 was 237 on 03/11/202 and 1424 on 06/14/2020.  Folate was 14.6 on 06/14/2020.  She received her COVID vaccine on 10/20/2019 and 11/10/2019.  Symptomatically, she feels good. She denies any fevers, sweats or weight loss. Exam reveals no adenopathy or hepatosplenomegaly. Hemoglobin 12.7. Platelets 134,000. WBC 45,600.  Plan: 1.   Labs today: CBC with diff, CMP, LDH, B12. 2. Chronic lymphocytic leukemia (CLL) WBC 106,900 (ALC 102,600) on 10/20/2019.  WBC   58,200 (ALC   53,900) on 04/14/2020.  WBC   45,600 (ALC   40,800) on 08/15/2020.  WBC is slowly improving over time.  Symptomatically, she denies any fevers, sweats or weight loss..    Exam reveals no adenopathy or hepatosplenomegaly. Ibrutinib began on 03/25/2019.   Continue ibrutinib 420 mg a day. 3.   Thrombocytopenia  Platelets 134,000 on 08/15/2020.   Continue to monitor. 4.B12 deficiency B12 level was1424 on 06/14/2020. Folate was14.6 on 06/14/2020. Patient on oral B12.             Check folate annually. 5.   Hypocalcemia  Calcium 8.7.  Patient takes calcium 800 mg a day.  Encourage calcium supplementation 1200 mg a day. 6.   Hypertension  BP 173/80 (unusual).  Recheck blood pressure.  Patient to recheck BP at home/away from clinic.  Patient to follow-up with primary care physician if blood pressure remains elevated. 7.   RTC in 4 months for MD assessment and labs (CBC with diff, CMP, LDH).  I discussed the assessment and treatment plan with the patient.  The patient was provided an opportunity to ask questions and all were answered.  The patient agreed with the plan and demonstrated an understanding of the instructions.  The patient was advised to call back if the symptoms  worsen or if the condition fails to improve as anticipated.  I provided 19 minutes of face-to-face time during this this encounter and > 50% was  spent counseling as documented under my assessment and plan.   Lequita Asal, MD, PhD    08/15/2020, 11:04 AM   I, Mirian Mo Tufford, am acting as a Education administrator for Lequita Asal, MD.  I, Eustis Mike Gip, MD, have reviewed the above documentation for accuracy and completeness, and I agree with the above.

## 2020-08-15 ENCOUNTER — Other Ambulatory Visit: Payer: Self-pay

## 2020-08-15 ENCOUNTER — Inpatient Hospital Stay: Payer: PPO | Attending: Hematology and Oncology | Admitting: Hematology and Oncology

## 2020-08-15 ENCOUNTER — Encounter: Payer: Self-pay | Admitting: Hematology and Oncology

## 2020-08-15 ENCOUNTER — Inpatient Hospital Stay: Payer: PPO

## 2020-08-15 ENCOUNTER — Telehealth: Payer: Self-pay

## 2020-08-15 ENCOUNTER — Ambulatory Visit: Payer: Self-pay | Admitting: *Deleted

## 2020-08-15 VITALS — BP 173/80 | HR 58 | Temp 98.2°F | Resp 16 | Wt 131.5 lb

## 2020-08-15 DIAGNOSIS — D696 Thrombocytopenia, unspecified: Secondary | ICD-10-CM

## 2020-08-15 DIAGNOSIS — E538 Deficiency of other specified B group vitamins: Secondary | ICD-10-CM

## 2020-08-15 DIAGNOSIS — C9110 Chronic lymphocytic leukemia of B-cell type not having achieved remission: Secondary | ICD-10-CM | POA: Insufficient documentation

## 2020-08-15 LAB — COMPREHENSIVE METABOLIC PANEL
ALT: 12 U/L (ref 0–44)
AST: 16 U/L (ref 15–41)
Albumin: 4.3 g/dL (ref 3.5–5.0)
Alkaline Phosphatase: 45 U/L (ref 38–126)
Anion gap: 9 (ref 5–15)
BUN: 21 mg/dL (ref 8–23)
CO2: 27 mmol/L (ref 22–32)
Calcium: 8.7 mg/dL — ABNORMAL LOW (ref 8.9–10.3)
Chloride: 101 mmol/L (ref 98–111)
Creatinine, Ser: 0.76 mg/dL (ref 0.44–1.00)
GFR, Estimated: >60 mL/min (ref >60)
Glucose, Bld: 81 mg/dL (ref 70–99)
Potassium: 4.9 mmol/L (ref 3.5–5.1)
Sodium: 137 mmol/L (ref 135–145)
Total Bilirubin: 0.7 mg/dL (ref 0.3–1.2)
Total Protein: 6.3 g/dL — ABNORMAL LOW (ref 6.5–8.1)

## 2020-08-15 LAB — CBC WITH DIFFERENTIAL/PLATELET
Abs Immature Granulocytes: 0.09 10*3/uL — ABNORMAL HIGH (ref 0.00–0.07)
Basophils Absolute: 0.1 10*3/uL (ref 0.0–0.1)
Basophils Relative: 0 %
Eosinophils Absolute: 0 10*3/uL (ref 0.0–0.5)
Eosinophils Relative: 0 %
HCT: 42.6 % (ref 36.0–46.0)
Hemoglobin: 12.7 g/dL (ref 12.0–15.0)
Immature Granulocytes: 0 %
Lymphocytes Relative: 90 %
Lymphs Abs: 40.8 10*3/uL — ABNORMAL HIGH (ref 0.7–4.0)
MCH: 27.1 pg (ref 26.0–34.0)
MCHC: 29.8 g/dL — ABNORMAL LOW (ref 30.0–36.0)
MCV: 90.8 fL (ref 80.0–100.0)
Monocytes Absolute: 0.6 10*3/uL (ref 0.1–1.0)
Monocytes Relative: 1 %
Neutro Abs: 4 10*3/uL (ref 1.7–7.7)
Neutrophils Relative %: 9 %
Platelets: 134 10*3/uL — ABNORMAL LOW (ref 150–400)
RBC: 4.69 MIL/uL (ref 3.87–5.11)
RDW: 14.6 % (ref 11.5–15.5)
WBC: 45.6 10*3/uL — ABNORMAL HIGH (ref 4.0–10.5)
nRBC: 0 % (ref 0.0–0.2)

## 2020-08-15 LAB — LACTATE DEHYDROGENASE: LDH: 135 U/L (ref 98–192)

## 2020-08-15 LAB — VITAMIN B12: Vitamin B-12: 980 pg/mL — ABNORMAL HIGH (ref 180–914)

## 2020-08-15 NOTE — Progress Notes (Signed)
Patient has no concerns for today visit

## 2020-08-15 NOTE — Telephone Encounter (Signed)
Please call pt and tell her that another provider is calling [facility] and wanting her to come in for a b/p check. Schedule for this week bp check

## 2020-08-15 NOTE — Telephone Encounter (Signed)
[facility]- patient is no longer in the office. 173/80- checked twice- today.No high BP symptoms reported. Provider is concerned and would like the office to reach out to patient .  They- checked 9/30 and it was elevated then too-152/67 .  Attempted to call patient- left message to call office- needs appointment.  Reason for Disposition . Systolic BP  >= 005 OR Diastolic >= 110  Answer Assessment - Initial Assessment Questions 1. BLOOD PRESSURE: "What is the blood pressure?" "Did you take at least two measurements 5 minutes apart?"     173/80, 170/80 2. ONSET: "When did you take your blood pressure?"     At appointment this am 3. HOW: "How did you obtain the blood pressure?" (e.g., visiting nurse, automatic home BP monitor)     Not asked  6. OTHER SYMPTOMS: "Do you have any symptoms?" (e.g., headache, chest pain, blurred vision, difficulty breathing, weakness)     No other symptoms  Protocols used: BLOOD PRESSURE - HIGH-A-AH

## 2020-08-15 NOTE — Telephone Encounter (Signed)
Left message for patient to follow up with an appointment for a blood pressure check.

## 2020-08-15 NOTE — Telephone Encounter (Signed)
Attempted to call patient again- no answer- left message to call office for appointment.

## 2020-08-31 MED FILL — IMBRUVICA 420 MG TAB: 420 | 28 days supply | Qty: 28 | Fill #1

## 2020-09-08 ENCOUNTER — Other Ambulatory Visit: Payer: Self-pay

## 2020-09-08 ENCOUNTER — Ambulatory Visit (INDEPENDENT_AMBULATORY_CARE_PROVIDER_SITE_OTHER): Payer: PPO | Admitting: Family Medicine

## 2020-09-08 ENCOUNTER — Encounter: Payer: Self-pay | Admitting: Family Medicine

## 2020-09-08 VITALS — BP 160/90 | HR 68 | Ht 67.0 in | Wt 133.0 lb

## 2020-09-08 DIAGNOSIS — I10 Essential (primary) hypertension: Secondary | ICD-10-CM

## 2020-09-08 MED ORDER — AMLODIPINE BESYLATE 2.5 MG PO TABS: 2.5000 mg | ORAL_TABLET | Freq: Every day | ORAL | 0 refills | Status: DC

## 2020-09-08 NOTE — Progress Notes (Signed)
Date:  09/08/2020   Name:  Maureen [name]   DOB:  1941-04-22   MRN:  [id number]   Chief Complaint: Hypertension (Dr Janece Canterbury wanted her b/p checked- she had 2 elevated readings in office. The second one was 173/80)  Hypertension This is a chronic problem. The current episode started more than 1 month ago. The problem has been waxing and waning since onset. The problem is uncontrolled. Pertinent negatives include no anxiety, blurred vision, chest pain, headaches, malaise/fatigue, neck pain, orthopnea, palpitations, peripheral edema, PND, shortness of breath or sweats. There are no associated agents to hypertension. Past treatments include nothing. The current treatment provides moderate improvement. There are no compliance problems.     Lab Results  Component Value Date   CREATININE 0.76 08/15/2020   BUN 21 08/15/2020   NA 137 08/15/2020   K 4.9 08/15/2020   CL 101 08/15/2020   CO2 27 08/15/2020   No results found for: CHOL, HDL, LDLCALC, LDLDIRECT, TRIG, CHOLHDL Lab Results  Component Value Date   TSH 1.713 03/25/2019   No results found for: HGBA1C Lab Results  Component Value Date   WBC 45.6 (H) 08/15/2020   HGB 12.7 08/15/2020   HCT 42.6 08/15/2020   MCV 90.8 08/15/2020   PLT 134 (L) 08/15/2020   Lab Results  Component Value Date   ALT 12 08/15/2020   AST 16 08/15/2020   ALKPHOS 45 08/15/2020   BILITOT 0.7 08/15/2020     Review of Systems  Constitutional: Negative.  Negative for chills, fatigue, fever, malaise/fatigue and unexpected weight change.  HENT: Negative for congestion, ear discharge, ear pain, rhinorrhea, sinus pressure, sneezing and sore throat.   Eyes: Negative for blurred vision, double vision, photophobia, pain, discharge, redness and itching.  Respiratory: Negative for cough, shortness of breath, wheezing and stridor.   Cardiovascular: Negative for chest pain, palpitations, orthopnea and PND.  Gastrointestinal: Negative for abdominal pain, blood  in stool, constipation, diarrhea, nausea and vomiting.  Endocrine: Negative for cold intolerance, heat intolerance, polydipsia, polyphagia and polyuria.  Genitourinary: Negative for dysuria, flank pain, frequency, hematuria, menstrual problem, pelvic pain, urgency, vaginal bleeding and vaginal discharge.  Musculoskeletal: Negative for arthralgias, back pain, myalgias and neck pain.  Skin: Negative for rash.  Allergic/Immunologic: Negative for environmental allergies and food allergies.  Neurological: Negative for dizziness, weakness, light-headedness, numbness and headaches.  Hematological: Negative for adenopathy. Does not bruise/bleed easily.  Psychiatric/Behavioral: Negative for dysphoric mood. The patient is not nervous/anxious.     Patient Active Problem List   Diagnosis Date Noted  . Paronychia of third toe of right foot 02/24/2020  . Need for prophylactic vaccination with combined diphtheria-tetanus-pertussis (DTP) vaccine 02/24/2020  . Injury of right toe, initial encounter 02/24/2020  . Thrombocytopenia ([place]) 10/20/2019  . Hyperkalemia 03/24/2019  . Anemia 03/24/2019  . Goals of care, counseling/discussion 03/24/2019  . Skin lesions 02/01/2019  . Abnormal iron saturation 10/22/2018  . B12 deficiency 09/24/2018  . Renal mass, right 10/22/2017  . Chemotherapy-induced neutropenia ([place]) 10/14/2017  . CLL (chronic lymphocytic leukemia) ([place]) 02/28/2016    No Known Allergies  Past Surgical History:  Procedure Laterality Date  . breeast mass removed  1988   benign    Social History   Tobacco Use  . Smoking status: Current Every Day Smoker    Packs/day: 0.25    Years: 56.00    Pack years: 14.00    Types: Cigarettes  . Smokeless tobacco: Never Used  Vaping Use  . Vaping Use:  Never used  Substance Use Topics  . Alcohol use: Yes    Alcohol/week: 1.0 standard drink    Types: 1 Glasses of wine per week    Comment: occassional  . Drug use: No     Medication list has  been reviewed and updated.  Current Meds  Medication Sig  . folic acid (FOLVITE) 643 MCG tablet Take 800 mcg by mouth 3 (three) times a week.   . IMBRUVICA 420 MG TABS TAKE 1 TABLET BY MOUTH ONCE DAILY  . Magnesium 400 MG CAPS Take 400 mg by mouth 3 (three) times daily.  . vitamin B-12 (CYANOCOBALAMIN) 1000 MCG tablet Take 1,000 mcg by mouth. Takes three times a week    PHQ 2/9 Scores 11/04/2019 12/18/2017 12/17/2016 03/20/2016  PHQ - 2 Score 0 0 0 1  PHQ- 9 Score - 0 - -    No flowsheet data found.  BP Readings from Last 3 Encounters:  09/08/20 (!) 160/90  08/15/20 (!) 173/80  04/14/20 (!) 152/67    Physical Exam Vitals reviewed.  Constitutional:      Appearance: She is well-developed and well-nourished.  HENT:     Head: Normocephalic.     Right Ear: Tympanic membrane, ear canal and external ear normal. There is no impacted cerumen.     Left Ear: Tympanic membrane, ear canal and external ear normal. There is no impacted cerumen.     Nose: Nose normal. No congestion or rhinorrhea.     Mouth/Throat:     Mouth: Oropharynx is clear and moist. Mucous membranes are moist.  Eyes:     General: Lids are everted, no foreign bodies appreciated. No scleral icterus.       Left eye: No foreign body or hordeolum.     Extraocular Movements: EOM normal.     Conjunctiva/sclera: Conjunctivae normal.     Right eye: Right conjunctiva is not injected.     Left eye: Left conjunctiva is not injected.     Pupils: Pupils are equal, round, and reactive to light.  Neck:     Thyroid: No thyromegaly.     Vascular: No JVD.     Trachea: No tracheal deviation.  Cardiovascular:     Rate and Rhythm: Normal rate and regular rhythm.     Pulses: Intact distal pulses.     Heart sounds: Normal heart sounds. No murmur heard. No friction rub. No gallop.   Pulmonary:     Effort: Pulmonary effort is normal. No respiratory distress.     Breath sounds: Normal breath sounds. No stridor. No wheezing, rhonchi or  rales.  Chest:     Chest wall: No tenderness.  Abdominal:     General: Bowel sounds are normal.     Palpations: Abdomen is soft. There is no hepatosplenomegaly or mass.     Tenderness: There is no abdominal tenderness. There is no guarding or rebound.  Musculoskeletal:        General: No tenderness or edema. Normal range of motion.     Cervical back: Normal range of motion and neck supple.  Lymphadenopathy:     Cervical: No cervical adenopathy.  Skin:    General: Skin is warm.     Findings: No rash.  Neurological:     Mental Status: She is alert and oriented to person, place, and time.     Cranial Nerves: No cranial nerve deficit.     Deep Tendon Reflexes: Strength normal. Reflexes normal.  Psychiatric:  Mood and Affect: Mood and affect normal. Mood is not anxious or depressed.     Wt Readings from Last 3 Encounters:  09/08/20 133 lb (60.3 kg)  08/15/20 131 lb 8.1 oz (59.6 kg)  04/14/20 124 lb 14.3 oz (56.7 kg)    BP (!) 160/90   Pulse 68   Ht 5\' 7"  (1.702 m)   Wt 133 lb (60.3 kg)   BMI 20.83 kg/m   Assessment and Plan:  1. Primary hypertension Relatively new onset over the past year with elevated readings and oncology.  Patient denies any chest pain, focal neurologic concerns, or peripheral artery symptomatology.  Patient has continued to smoke will likely continue to do so.  We have decided to initiate antihypertensive therapy but we will go slow with amlodipine 2.5 mg once a day.  We will recheck patient in 6 weeks with blood pressure and likely labs at that time. - amLODipine (NORVASC) 2.5 MG tablet; Take 1 tablet (2.5 mg total) by mouth daily.  Dispense: 60 tablet; Refill: 0

## 2020-10-12 IMAGING — CR DG TOE 3RD 2+V*R*
4 series · 4 of 4 positions shown · non-contrast
Comparison: None.

CLINICAL DATA: Right third toe injury a week ago.

EXAM:
RIGHT THIRD TOE

[toe obl]
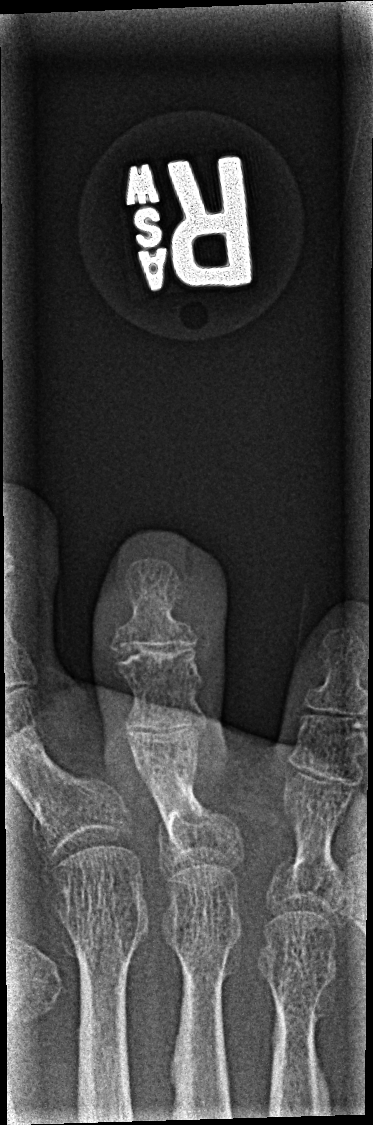

[toe lat (1 of 2)]
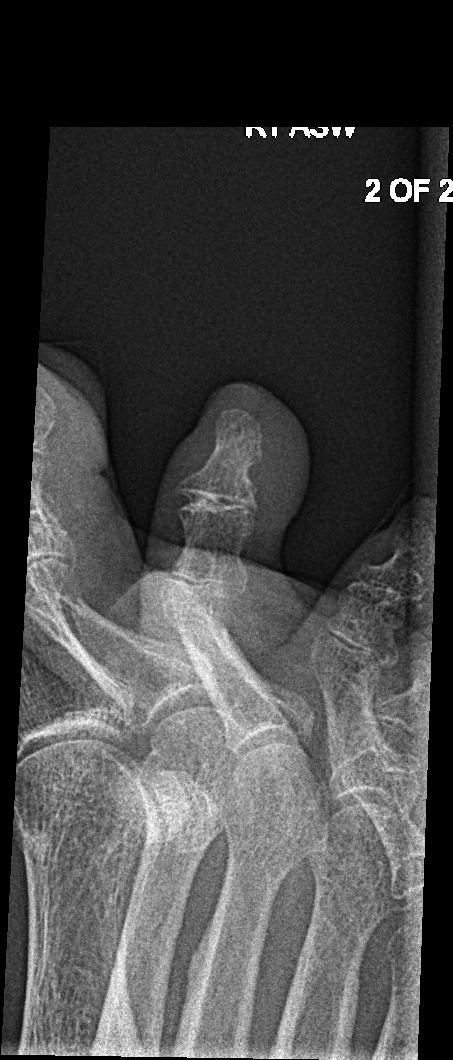

[toe ap]
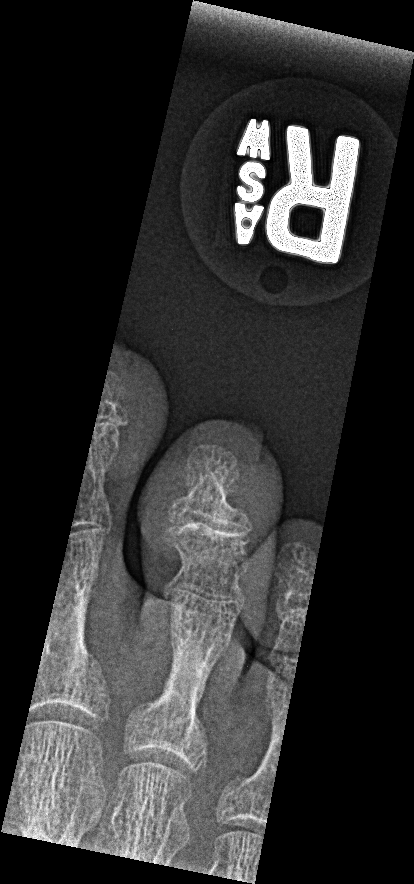

[toe lat (2 of 2)]
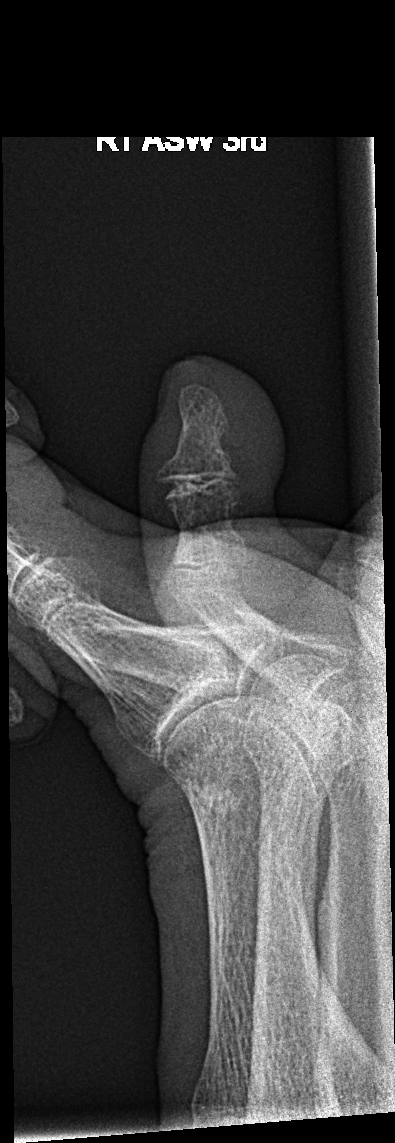

[4 of 4 positions shown; findings below may reference images not displayed]

FINDINGS: No acute fracture or dislocation. Mild degenerative changes of the
third DIP joint. Osteopenia. Soft tissues are unremarkable.
IMPRESSION: 1. No acute osseous abnormality.

## 2020-10-13 ENCOUNTER — Other Ambulatory Visit (HOSPITAL_COMMUNITY): Payer: Self-pay

## 2020-10-20 ENCOUNTER — Other Ambulatory Visit (HOSPITAL_COMMUNITY): Payer: Self-pay

## 2020-10-20 MED FILL — Ibrutinib Tab 420 MG: ORAL | 28 days supply | Qty: 28 | Fill #0 | Status: AC

## 2020-10-21 ENCOUNTER — Ambulatory Visit: Payer: PPO | Admitting: Family Medicine

## 2020-10-26 ENCOUNTER — Ambulatory Visit (INDEPENDENT_AMBULATORY_CARE_PROVIDER_SITE_OTHER): Payer: PPO | Admitting: Family Medicine

## 2020-10-26 ENCOUNTER — Other Ambulatory Visit: Payer: Self-pay

## 2020-10-26 ENCOUNTER — Encounter: Payer: Self-pay | Admitting: Family Medicine

## 2020-10-26 VITALS — BP 130/80 | HR 60 | Ht 67.0 in | Wt 132.0 lb

## 2020-10-26 DIAGNOSIS — I10 Essential (primary) hypertension: Secondary | ICD-10-CM | POA: Diagnosis not present

## 2020-10-26 MED ORDER — AMLODIPINE BESYLATE 2.5 MG PO TABS: 2.5000 mg | ORAL_TABLET | Freq: Every day | ORAL | 1 refills | Status: DC

## 2020-10-26 NOTE — Progress Notes (Signed)
Date:  10/26/2020   Name:  Maureen [name]   DOB:  04-Jun-1941   MRN:  [id number]   Chief Complaint: Hypertension  Hypertension This is a chronic problem. The current episode started more than 1 year ago. The problem has been gradually improving since onset. The problem is controlled. Pertinent negatives include no anxiety, blurred vision, chest pain, headaches, malaise/fatigue, neck pain, orthopnea, palpitations, peripheral edema, PND, shortness of breath or sweats. Past treatments include calcium channel blockers. The current treatment provides moderate improvement. There are no compliance problems.  There is no history of angina, kidney disease, CAD/MI, CVA, heart failure, left ventricular hypertrophy, PVD or retinopathy. There is no history of chronic renal disease, a hypertension causing med or renovascular disease.    Lab Results  Component Value Date   CREATININE 0.76 08/15/2020   BUN 21 08/15/2020   NA 137 08/15/2020   K 4.9 08/15/2020   CL 101 08/15/2020   CO2 27 08/15/2020   No results found for: CHOL, HDL, LDLCALC, LDLDIRECT, TRIG, CHOLHDL Lab Results  Component Value Date   TSH 1.713 03/25/2019   No results found for: HGBA1C Lab Results  Component Value Date   WBC 45.6 (H) 08/15/2020   HGB 12.7 08/15/2020   HCT 42.6 08/15/2020   MCV 90.8 08/15/2020   PLT 134 (L) 08/15/2020   Lab Results  Component Value Date   ALT 12 08/15/2020   AST 16 08/15/2020   ALKPHOS 45 08/15/2020   BILITOT 0.7 08/15/2020     Review of Systems  Constitutional: Negative.  Negative for chills, fatigue, fever, malaise/fatigue and unexpected weight change.  HENT: Negative for congestion, ear discharge, ear pain, rhinorrhea, sinus pressure, sneezing and sore throat.   Eyes: Negative for blurred vision, photophobia, pain, discharge, redness and itching.  Respiratory: Negative for cough, shortness of breath, wheezing and stridor.   Cardiovascular: Negative for chest pain, palpitations,  orthopnea and PND.  Gastrointestinal: Negative for abdominal pain, blood in stool, constipation, diarrhea, nausea and vomiting.  Endocrine: Negative for cold intolerance, heat intolerance, polydipsia, polyphagia and polyuria.  Genitourinary: Negative for dysuria, flank pain, frequency, hematuria, menstrual problem, pelvic pain, urgency, vaginal bleeding and vaginal discharge.  Musculoskeletal: Negative for arthralgias, back pain, myalgias and neck pain.  Skin: Negative for rash.  Allergic/Immunologic: Negative for environmental allergies and food allergies.  Neurological: Negative for dizziness, weakness, light-headedness, numbness and headaches.  Hematological: Negative for adenopathy. Does not bruise/bleed easily.  Psychiatric/Behavioral: Negative for dysphoric mood. The patient is not nervous/anxious.     Patient Active Problem List   Diagnosis Date Noted  . Paronychia of third toe of right foot 02/24/2020  . Need for prophylactic vaccination with combined diphtheria-tetanus-pertussis (DTP) vaccine 02/24/2020  . Injury of right toe, initial encounter 02/24/2020  . Thrombocytopenia ([place]) 10/20/2019  . Hyperkalemia 03/24/2019  . Anemia 03/24/2019  . Goals of care, counseling/discussion 03/24/2019  . Skin lesions 02/01/2019  . Abnormal iron saturation 10/22/2018  . B12 deficiency 09/24/2018  . Renal mass, right 10/22/2017  . Chemotherapy-induced neutropenia ([place]) 10/14/2017  . 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 breeast mass removed  1988   benign    Social History   Tobacco Use  . Smoking status: Current Every Day Smoker    Packs/day: 0.25    Years: 56.00    Pack years: 14.00    Types: Cigarettes  . Smokeless tobacco: Never Used  Vaping Use  . Vaping Use: Never used  Substance Use Topics  . Alcohol use: Yes    Alcohol/week: 1.0 standard drink    Types: 1 Glasses of wine per  week    Comment: occassional  . Drug use: No     Medication list has been reviewed and updated.  Current Meds  Medication Sig  . amLODipine (NORVASC) 2.5 MG tablet Take 1 tablet (2.5 mg total) by mouth daily.  . folic acid (FOLVITE) 496 MCG tablet Take 800 mcg by mouth 3 (three) times a week.   . IMBRUVICA 420 MG TABS TAKE 1 TABLET BY MOUTH ONCE DAILY  . Magnesium 400 MG CAPS Take 400 mg by mouth 3 (three) times daily.  . mupirocin cream (BACTROBAN) 2 % Apply 1 application topically 2 (two) times daily.  . vitamin B-12 (CYANOCOBALAMIN) 1000 MCG tablet Take 1,000 mcg by mouth. Takes three times a week    PHQ 2/9 Scores 10/26/2020 11/04/2019 12/18/2017 12/17/2016  PHQ - 2 Score 0 0 0 0  PHQ- 9 Score 0 - 0 -    GAD 7 : Generalized Anxiety Score 10/26/2020  Nervous, Anxious, on Edge 0  Control/stop worrying 0  Worry too much - different things 0  Trouble relaxing 0  Restless 0  Easily annoyed or irritable 0  Afraid - awful might happen 0  Total GAD 7 Score 0    BP Readings from Last 3 Encounters:  10/26/20 130/80  09/08/20 (!) 160/90  08/15/20 (!) 173/80    Physical Exam Vitals and nursing note reviewed.  Constitutional:      Appearance: She is well-developed.  HENT:     Head: Normocephalic.     Right Ear: Tympanic membrane and external ear normal.     Left Ear: Tympanic membrane and external ear normal.     Nose: Nose normal.     Mouth/Throat:     Mouth: Mucous membranes are moist.  Eyes:     General: Lids are everted, no foreign bodies appreciated. No scleral icterus.       Left eye: No foreign body or hordeolum.     Conjunctiva/sclera: Conjunctivae normal.     Right eye: Right conjunctiva is not injected.     Left eye: Left conjunctiva is not injected.     Pupils: Pupils are equal, round, and reactive to light.  Neck:     Thyroid: No thyromegaly.     Vascular: No JVD.     Trachea: No tracheal deviation.  Cardiovascular:     Rate and Rhythm: Normal rate and  regular rhythm.     Heart sounds: Normal heart sounds. No murmur heard. No friction rub. No gallop.   Pulmonary:     Effort: Pulmonary effort is normal. No respiratory distress.     Breath sounds: Normal breath sounds. No wheezing, rhonchi or rales.  Abdominal:     General: Bowel sounds are normal.     Palpations: Abdomen is soft. There is no mass.     Tenderness: There is no abdominal tenderness. There is no guarding or rebound.  Musculoskeletal:        General: No tenderness. Normal range of motion.     Cervical back: Normal range of motion and neck supple.  Lymphadenopathy:     Cervical: No cervical adenopathy.  Skin:    General: Skin is warm.     Findings: No rash.  Neurological:     Mental Status: She is alert and oriented to person, place,  and time.     Cranial Nerves: No cranial nerve deficit.     Deep Tendon Reflexes: Reflexes normal.  Psychiatric:        Mood and Affect: Mood is not anxious or depressed.     Wt Readings from Last 3 Encounters:  10/26/20 132 lb (59.9 kg)  09/08/20 133 lb (60.3 kg)  08/15/20 131 lb 8.1 oz (59.6 kg)    BP 130/80   Pulse 60   Ht 5\' 7"  (1.702 m)   Wt 132 lb (59.9 kg)   BMI 20.67 kg/m   Assessment and Plan:  1. Primary hypertension Chronic.  Controlled.  Stable.  Blood pressure today is improved to 130/80.  Patient was initiated on amlodipine 2.5 mg and is tolerating well and we have reasonable control at this time.  We will continue on present dosing of amlodipine 2.5 daily and will recheck in 6 months and at that time will review her electrolytes and GFR with renal function panel which may be done by ourselves or cancer center. - amLODipine (NORVASC) 2.5 MG tablet; Take 1 tablet (2.5 mg total) by mouth daily.  Dispense: 90 tablet; Refill: 1

## 2020-10-27 ENCOUNTER — Other Ambulatory Visit (HOSPITAL_COMMUNITY): Payer: Self-pay

## 2020-11-09 ENCOUNTER — Ambulatory Visit (INDEPENDENT_AMBULATORY_CARE_PROVIDER_SITE_OTHER): Payer: PPO

## 2020-11-09 ENCOUNTER — Other Ambulatory Visit: Payer: Self-pay

## 2020-11-09 VITALS — BP 140/80 | HR 64 | Temp 97.9°F | Resp 15 | Ht 67.0 in | Wt 132.8 lb

## 2020-11-09 DIAGNOSIS — Z0000 Encounter for general adult medical examination without abnormal findings: Secondary | ICD-10-CM | POA: Diagnosis not present

## 2020-11-09 NOTE — Progress Notes (Signed)
Subjective:   Maureen [name] is a 80 y.o. female who presents for Medicare Annual (Subsequent) preventive examination.  Review of Systems     Cardiac Risk Factors include: advanced age (>79men, >73 women);smoking/ tobacco exposure     Objective:    Today's Vitals   11/09/20 1332  BP: 140/80  Pulse: 64  Resp: 15  Temp: 97.9 F (36.6 C)  TempSrc: Oral  SpO2: 94%  Weight: 132 lb 12.8 oz (60.2 kg)  Height: 5\' 7"  (1.702 m)  PainSc: 8    Body mass index is 20.8 kg/m.  Advanced Directives 11/09/2020 08/15/2020 04/14/2020 08/26/2019 07/28/2019 07/01/2019 06/02/2019  Does Patient Have a Medical Advance Directive? Yes Yes No Yes Yes Yes No;Yes  Type of Paramedic of [place];Living will Healthcare Power of [place];Living will [place];Living will [place];Living will [place];Living will  Does patient want to make changes to medical advance directive? - - - - No - Patient declined No - Patient declined No - Patient declined  Copy of [place] in Chart? Yes - validated most recent copy scanned in chart (See row information) No - copy requested - Yes - validated most recent copy scanned in chart (See row information) - Yes - validated most recent copy scanned in chart (See row information) -  Would patient like information on creating a medical advance directive? - - No - Patient declined - - - -    Current Medications (verified) Outpatient Encounter Medications as of 11/09/2020  Medication Sig  . amLODipine (NORVASC) 2.5 MG tablet Take 1 tablet (2.5 mg total) by mouth daily.  . folic acid (FOLVITE) 540 MCG tablet Take 800 mcg by mouth 3 (three) times a week.   . IMBRUVICA 420 MG TABS TAKE 1 TABLET BY MOUTH ONCE DAILY  . Magnesium 400 MG CAPS Take 400 mg by mouth 3 (three) times daily.  . vitamin B-12 (CYANOCOBALAMIN) 1000 MCG tablet Take 1,000 mcg by  mouth. Takes three times a week  . [DISCONTINUED] ibrutinib 420 MG TABS TAKE 1 TABLET BY MOUTH ONCE DAILY  . [DISCONTINUED] ibrutinib 420 MG TABS TAKE 1 TABLET BY MOUTH ONCE DAILY  . mupirocin cream (BACTROBAN) 2 % Apply 1 application topically 2 (two) times daily.   No facility-administered encounter medications on file as of 11/09/2020.    Allergies (verified) Patient has no known allergies.   History: Past Medical History:  Diagnosis Date  . Anemia   . CLL (chronic lymphocytic leukemia) ([place]) 2002  . CLL (chronic lymphocytic leukemia) ([place])   . Itching   . Vitamin B 12 deficiency    Past Surgical History:  Procedure Laterality Date  . breeast mass removed  1988   benign   Family History  Family history unknown: Yes   Social History   Socioeconomic History  . Marital status: Divorced    Spouse name: Not on file  . Number of children: 2  . Years of education: Not on file  . Highest education level: 9th grade  Occupational History  . Occupation: Retired  Tobacco Use  . Smoking status: Current Every Day Smoker    Packs/day: 0.25    Years: 56.00    Pack years: 14.00    Types: Cigarettes  . Smokeless tobacco: Never Used  Vaping Use  . Vaping Use: Never used  Substance and Sexual Activity  . Alcohol use: Yes    Alcohol/week: 1.0  standard drink    Types: 1 Glasses of wine per week    Comment: occassional  . Drug use: No  . Sexual activity: Not Currently  Other Topics Concern  . Not on file  Social History Narrative   Pt lives alone.    Social Determinants of Health   Financial Resource Strain: High Risk  . Difficulty of Paying Living Expenses: Hard  Food Insecurity: No Food Insecurity  . Worried About Charity fundraiser in the Last Year: Never true  . Ran Out of Food in the Last Year: Never true  Transportation Needs: No Transportation Needs  . Lack of Transportation (Medical): No  . Lack of Transportation (Non-Medical): No  Physical Activity: Inactive   . Days of Exercise per Week: 0 days  . Minutes of Exercise per Session: 0 min  Stress: No Stress Concern Present  . Feeling of Stress : Only a little  Social Connections: Socially Isolated  . Frequency of Communication with Friends and Family: More than three times a week  . Frequency of Social Gatherings with Friends and Family: Once a week  . Attends Religious Services: Never  . Active Member of Clubs or Organizations: No  . Attends Archivist Meetings: Never  . Marital Status: Divorced    Tobacco Counseling Ready to quit: Not Answered Counseling given: Not Answered   Clinical Intake:  Pre-visit preparation completed: Yes  Pain : 0-10 Pain Score: 8  Pain Type: Chronic pain Pain Location: Back Pain Orientation: Lower,Left,Right Pain Descriptors / Indicators: Aching,Discomfort,Sore,Shooting Pain Onset: More than a month ago Pain Frequency: Constant     BMI - recorded: 20.8 Nutritional Status: BMI of 19-24  Normal Nutritional Risks: None Diabetes: No  How often do you need to have someone help you when you read instructions, pamphlets, or other written materials from your doctor or pharmacy?: 1 - Never    Interpreter Needed?: No  Information entered by :: Clemetine Marker LPN   Activities of Daily Living In your present state of health, do you have any difficulty performing the following activities: 11/09/2020  Hearing? N  Comment declines hearing aids  Vision? N  Difficulty concentrating or making decisions? N  Walking or climbing stairs? Y  Dressing or bathing? N  Doing errands, shopping? N  Preparing Food and eating ? N  Using the Toilet? N  In the past six months, have you accidently leaked urine? Y  Comment wears pads for protection  Do you have problems with loss of bowel control? N  Managing your Medications? N  Managing your Finances? N  Housekeeping or managing your Housekeeping? N  Some recent data might be hidden    Patient Care  Team: Juline Patch, MD as PCP - General (Family Medicine) Lequita Asal, MD as Referring Physician (Hematology and Oncology) Jannet Mantis, MD (Dermatology)  Indicate any recent Medical Services you may have received from other than [facility] providers in the past year (date may be approximate).     Assessment:   This is a routine wellness examination for [place].  Hearing/Vision screen  Hearing Screening   125Hz  250Hz  500Hz  1000Hz  2000Hz  3000Hz  4000Hz  6000Hz  8000Hz   Right ear:           Left ear:           Comments: Pt denies hearing difficulty  Vision Screening Comments: Sees Dr. Wyatt Portela for annual eye exams; due for exam   Dietary issues and exercise activities discussed: Current Exercise Habits:  The patient does not participate in regular exercise at present, Exercise limited by: Other - see comments (CLL)  Goals    . DIET - INCREASE WATER INTAKE     Recommend to drink at least 6-8 8oz glasses of water per day.      Depression Screen PHQ 2/9 Scores 11/09/2020 10/26/2020 11/04/2019 12/18/2017 12/17/2016 03/20/2016  PHQ - 2 Score 0 0 0 0 0 1  PHQ- 9 Score - 0 - 0 - -    Fall Risk Fall Risk  11/09/2020 03/24/2020 11/04/2019 12/18/2017 12/17/2016  Falls in the past year? 0 0 0 Yes No  Comment - - - tripped over cart at grocery store -  Number falls in past yr: 0 - 0 1 -  Injury with Fall? 0 - 0 Yes -  Comment - - - fractured L patella -  Risk Factor Category  - - - High Fall Risk -  Risk for fall due to : No Fall Risks - No Fall Risks Other (Comment);Impaired vision -  Risk for fall due to: Comment - - - fatigue; wears eyeglasses -  Follow up Falls prevention discussed Falls evaluation completed Falls prevention discussed Falls evaluation completed;Education provided;Falls prevention discussed -    FALL RISK PREVENTION PERTAINING TO THE HOME:  Any stairs in or around the home? Yes  If so, are there any without handrails? No  Home free of loose throw rugs in walkways, pet  beds, electrical cords, etc? Yes  Adequate lighting in your home to reduce risk of falls? Yes   ASSISTIVE DEVICES UTILIZED TO PREVENT FALLS:  Life alert? No  Use of a cane, walker or w/c? No  Grab bars in the bathroom? No  Shower chair or bench in shower? No  Elevated toilet seat or a handicapped toilet? No   TIMED UP AND GO:  Was the test performed? Yes .  Length of time to ambulate 10 feet: 5 sec.   Gait steady and fast without use of assistive device  Cognitive Function:     6CIT Screen 11/09/2020 11/04/2019 12/18/2017 12/17/2016  What Year? 0 points 0 points 0 points 0 points  What month? 0 points 0 points 0 points 0 points  What time? 0 points 0 points 3 points 0 points  Count back from 20 0 points 0 points 0 points 0 points  Months in reverse 0 points 2 points 0 points 0 points  Repeat phrase 0 points 0 points 0 points 2 points  Total Score 0 2 3 2     Immunizations Immunization History  Administered Date(s) Administered  . Fluad Quad(high Dose 65+) 03/24/2020  . Influenza, High Dose Seasonal PF 04/01/2017, 03/31/2018, 04/05/2019  . Influenza,inj,Quad PF,6+ Mos 04/07/2015  . Influenza-Unspecified 04/15/2014, 03/31/2018, 04/05/2019  . PFIZER(Purple Top)SARS-COV-2 Vaccination 10/20/2019, 11/10/2019, 08/03/2020  . Pneumococcal Conjugate-13 03/30/2017  . Pneumococcal Polysaccharide-23 04/07/2020  . Pneumococcal-Unspecified 04/06/2016  . Tdap 02/24/2020    TDAP status: Up to date  Flu Vaccine status: Up to date  Pneumococcal vaccine status: Up to date  Covid-19 vaccine status: Completed vaccines  Qualifies for Shingles Vaccine? No  currently taking imbruvica.  Screening Tests Health Maintenance  Topic Date Due  . DEXA SCAN  03/24/2021 (Originally 04/28/2004)  . COVID-19 Vaccine (4 - Booster for Pfizer series) 01/31/2021  . INFLUENZA VACCINE  02/13/2021  . PNA vac Low Risk Adult  Completed  . HPV VACCINES  Aged Out  . TETANUS/TDAP  Discontinued    Health  Maintenance  There  are no preventive care reminders to display for this patient.  Colorectal cancer screening: No longer required.   Mammogram status: No longer required due to age.  Bone density screening: no longer required due to age  Lung Cancer Screening: (Low Dose CT Chest recommended if Age 82-80 years, 30 pack-year currently smoking OR have quit w/in 15years.) does not qualify.   Additional Screening:  Hepatitis C Screening: does not qualify.  Vision Screening: Recommended annual ophthalmology exams for early detection of glaucoma and other disorders of the eye. Is the patient up to date with their annual eye exam?  No  Who is the provider or what is the name of the office in which the patient attends annual eye exams? Dr. Wyatt Portela  Dental Screening: Recommended annual dental exams for proper oral hygiene  Community Resource Referral / Chronic Care Management: CRR required this visit?  No   CCM required this visit?  No      Plan:     I have personally reviewed and noted the following in the patient's chart:   . Medical and social history . Use of alcohol, tobacco or illicit drugs  . Current medications and supplements . Functional ability and status . Nutritional status . Physical activity . Advanced directives . List of other physicians . Hospitalizations, surgeries, and ER visits in previous 12 months . Vitals . Screenings to include cognitive, depression, and falls . Referrals and appointments  In addition, I have reviewed and discussed with patient certain preventive protocols, quality metrics, and best practice recommendations. A written personalized care plan for preventive services as well as general preventive health recommendations were provided to patient.     Clemetine Marker, LPN   [id number]   Nurse Notes: pt c/o bilateral lower back & sciatic pain. Pt advised to schedule follow up appt with Dr. Ronnald Ramp to discuss.

## 2020-11-09 NOTE — Patient Instructions (Signed)
Maureen [name] , Thank you for taking time to come for your Medicare Wellness Visit. I appreciate your ongoing commitment to your health goals. Please review the following plan we discussed and let me know if I can assist you in the future.   Screening recommendations/referrals: Colonoscopy: no longer required Mammogram: no longer required Bone Density: no longer required Recommended yearly ophthalmology/optometry visit for glaucoma screening and checkup Recommended yearly dental visit for hygiene and checkup  Vaccinations: Influenza vaccine: done 03/24/20 Pneumococcal vaccine: done 04/07/20 Tdap vaccine: done 02/24/20 Shingles vaccine: n/a   Covid-19:done 10/20/19, 11/10/19 & 08/03/20  Conditions/risks identified: Recommend drinking 6-8 glasses of water per day   Next appointment: Follow up in one year for your annual wellness visit    Preventive Care 60 Years and Older, Female Preventive care refers to lifestyle choices and visits with your health care provider that can promote health and wellness. What does preventive care include?  A yearly physical exam. This is also called an annual well check.  Dental exams once or twice a year.  Routine eye exams. Ask your health care provider how often you should have your eyes checked.  Personal lifestyle choices, including:  Daily care of your teeth and gums.  Regular physical activity.  Eating a healthy diet.  Avoiding tobacco and drug use.  Limiting alcohol use.  Practicing safe sex.  Taking low-dose aspirin every day.  Taking vitamin and mineral supplements as recommended by your health care provider. What happens during an annual well check? The services and screenings done by your health care provider during your annual well check will depend on your age, overall health, lifestyle risk factors, and family history of disease. Counseling  Your health care provider may ask you questions about your:  Alcohol use.  Tobacco  use.  Drug use.  Emotional well-being.  Home and relationship well-being.  Sexual activity.  Eating habits.  History of falls.  Memory and ability to understand (cognition).  Work and work Statistician.  Reproductive health. Screening  You may have the following tests or measurements:  Height, weight, and BMI.  Blood pressure.  Lipid and cholesterol levels. These may be checked every 5 years, or more frequently if you are over 54 years old.  Skin check.  Lung cancer screening. You may have this screening every year starting at age 43 if you have a 30-pack-year history of smoking and currently smoke or have quit within the past 15 years.  Fecal occult blood test (FOBT) of the stool. You may have this test every year starting at age 31.  Flexible sigmoidoscopy or colonoscopy. You may have a sigmoidoscopy every 5 years or a colonoscopy every 10 years starting at age 76.  Hepatitis C blood test.  Hepatitis B blood test.  Sexually transmitted disease (STD) testing.  Diabetes screening. This is done by checking your blood sugar (glucose) after you have not eaten for a while (fasting). You may have this done every 1-3 years.  Bone density scan. This is done to screen for osteoporosis. You may have this done starting at age 47.  Mammogram. This may be done every 1-2 years. Talk to your health care provider about how often you should have regular mammograms. Talk with your health care provider about your test results, treatment options, and if necessary, the need for more tests. Vaccines  Your health care provider may recommend certain vaccines, such as:  Influenza vaccine. This is recommended every year.  Tetanus, diphtheria, and acellular pertussis (Tdap,  Td) vaccine. You may need a Td booster every 10 years.  Zoster vaccine. You may need this after age 28.  Pneumococcal 13-valent conjugate (PCV13) vaccine. One dose is recommended after age 53.  Pneumococcal  polysaccharide (PPSV23) vaccine. One dose is recommended after age 78. Talk to your health care provider about which screenings and vaccines you need and how often you need them. This information is not intended to replace advice given to you by your health care provider. Make sure you discuss any questions you have with your health care provider. Document Released: 07/29/2015 Document Revised: 03/21/2016 Document Reviewed: 05/03/2015 Elsevier Interactive Patient Education  2017 [place] Prevention in the Home Falls can cause injuries. They can happen to people of all ages. There are many things you can do to make your home safe and to help prevent falls. What can I do on the outside of my home?  Regularly fix the edges of walkways and driveways and fix any cracks.  Remove anything that might make you trip as you walk through a door, such as a raised step or threshold.  Trim any bushes or trees on the path to your home.  Use bright outdoor lighting.  Clear any walking paths of anything that might make someone trip, such as rocks or tools.  Regularly check to see if handrails are loose or broken. Make sure that both sides of any steps have handrails.  Any raised decks and porches should have guardrails on the edges.  Have any leaves, snow, or ice cleared regularly.  Use sand or salt on walking paths during winter.  Clean up any spills in your garage right away. This includes oil or grease spills. What can I do in the bathroom?  Use night lights.  Install grab bars by the toilet and in the tub and shower. Do not use towel bars as grab bars.  Use non-skid mats or decals in the tub or shower.  If you need to sit down in the shower, use a plastic, non-slip stool.  Keep the floor dry. Clean up any water that spills on the floor as soon as it happens.  Remove soap buildup in the tub or shower regularly.  Attach bath mats securely with double-sided non-slip rug  tape.  Do not have throw rugs and other things on the floor that can make you trip. What can I do in the bedroom?  Use night lights.  Make sure that you have a light by your bed that is easy to reach.  Do not use any sheets or blankets that are too big for your bed. They should not hang down onto the floor.  Have a firm chair that has side arms. You can use this for support while you get dressed.  Do not have throw rugs and other things on the floor that can make you trip. What can I do in the kitchen?  Clean up any spills right away.  Avoid walking on wet floors.  Keep items that you use a lot in easy-to-reach places.  If you need to reach something above you, use a strong step stool that has a grab bar.  Keep electrical cords out of the way.  Do not use floor polish or wax that makes floors slippery. If you must use wax, use non-skid floor wax.  Do not have throw rugs and other things on the floor that can make you trip. What can I do with my stairs?  Do not  leave any items on the stairs.  Make sure that there are handrails on both sides of the stairs and use them. Fix handrails that are broken or loose. Make sure that handrails are as long as the stairways.  Check any carpeting to make sure that it is firmly attached to the stairs. Fix any carpet that is loose or worn.  Avoid having throw rugs at the top or bottom of the stairs. If you do have throw rugs, attach them to the floor with carpet tape.  Make sure that you have a light switch at the top of the stairs and the bottom of the stairs. If you do not have them, ask someone to add them for you. What else can I do to help prevent falls?  Wear shoes that:  Do not have high heels.  Have rubber bottoms.  Are comfortable and fit you well.  Are closed at the toe. Do not wear sandals.  If you use a stepladder:  Make sure that it is fully opened. Do not climb a closed stepladder.  Make sure that both sides of the  stepladder are locked into place.  Ask someone to hold it for you, if possible.  Clearly mark and make sure that you can see:  Any grab bars or handrails.  First and last steps.  Where the edge of each step is.  Use tools that help you move around (mobility aids) if they are needed. These include:  Canes.  Walkers.  Scooters.  Crutches.  Turn on the lights when you go into a dark area. Replace any light bulbs as soon as they burn out.  Set up your furniture so you have a clear path. Avoid moving your furniture around.  If any of your floors are uneven, fix them.  If there are any pets around you, be aware of where they are.  Review your medicines with your doctor. Some medicines can make you feel dizzy. This can increase your chance of falling. Ask your doctor what other things that you can do to help prevent falls. This information is not intended to replace advice given to you by your health care provider. Make sure you discuss any questions you have with your health care provider. Document Released: 04/28/2009 Document Revised: 12/08/2015 Document Reviewed: 08/06/2014 Elsevier Interactive Patient Education  2017 [employer].

## 2020-11-17 ENCOUNTER — Other Ambulatory Visit: Payer: Self-pay | Admitting: Oncology

## 2020-11-17 ENCOUNTER — Other Ambulatory Visit (HOSPITAL_COMMUNITY): Payer: Self-pay

## 2020-11-18 ENCOUNTER — Other Ambulatory Visit (HOSPITAL_COMMUNITY): Payer: Self-pay

## 2020-11-18 MED ORDER — IMBRUVICA 420 MG PO TABS
1.0000 | ORAL_TABLET | Freq: Every day | ORAL | 1 refills | Status: DC
  Filled 2020-11-18: qty 28, 28d supply, fill #0
  Filled 2020-12-20: qty 28, 28d supply, fill #1

## 2020-11-25 ENCOUNTER — Other Ambulatory Visit: Payer: Self-pay

## 2020-11-25 DIAGNOSIS — C9110 Chronic lymphocytic leukemia of B-cell type not having achieved remission: Secondary | ICD-10-CM

## 2020-11-30 ENCOUNTER — Inpatient Hospital Stay (HOSPITAL_BASED_OUTPATIENT_CLINIC_OR_DEPARTMENT_OTHER): Payer: PPO | Admitting: Oncology

## 2020-11-30 ENCOUNTER — Inpatient Hospital Stay: Payer: PPO | Attending: Oncology

## 2020-11-30 ENCOUNTER — Encounter: Payer: Self-pay | Admitting: Oncology

## 2020-11-30 ENCOUNTER — Other Ambulatory Visit: Payer: Self-pay

## 2020-11-30 VITALS — BP 157/62 | HR 58 | Temp 97.6°F | Resp 14 | Wt 131.4 lb

## 2020-11-30 DIAGNOSIS — Z79899 Other long term (current) drug therapy: Secondary | ICD-10-CM

## 2020-11-30 DIAGNOSIS — C9110 Chronic lymphocytic leukemia of B-cell type not having achieved remission: Secondary | ICD-10-CM

## 2020-11-30 LAB — CBC WITH DIFFERENTIAL/PLATELET
Abs Immature Granulocytes: 0.07 10*3/uL (ref 0.00–0.07)
Basophils Absolute: 0.1 10*3/uL (ref 0.0–0.1)
Basophils Relative: 0 %
Eosinophils Absolute: 0 10*3/uL (ref 0.0–0.5)
Eosinophils Relative: 0 %
HCT: 39.6 % (ref 36.0–46.0)
Hemoglobin: 12.1 g/dL (ref 12.0–15.0)
Immature Granulocytes: 0 %
Lymphocytes Relative: 88 %
Lymphs Abs: 30.5 10*3/uL — ABNORMAL HIGH (ref 0.7–4.0)
MCH: 27 pg (ref 26.0–34.0)
MCHC: 30.6 g/dL (ref 30.0–36.0)
MCV: 88.4 fL (ref 80.0–100.0)
Monocytes Absolute: 0.5 10*3/uL (ref 0.1–1.0)
Monocytes Relative: 2 %
Neutro Abs: 3.5 10*3/uL (ref 1.7–7.7)
Neutrophils Relative %: 10 %
Platelets: 134 10*3/uL — ABNORMAL LOW (ref 150–400)
RBC: 4.48 MIL/uL (ref 3.87–5.11)
RDW: 15.1 % (ref 11.5–15.5)
WBC Morphology: ABNORMAL
WBC: 34.3 10*3/uL — ABNORMAL HIGH (ref 4.0–10.5)
nRBC: 0 % (ref 0.0–0.2)

## 2020-11-30 LAB — COMPREHENSIVE METABOLIC PANEL
ALT: 13 U/L (ref 0–44)
AST: 15 U/L (ref 15–41)
Albumin: 4 g/dL (ref 3.5–5.0)
Alkaline Phosphatase: 42 U/L (ref 38–126)
Anion gap: 7 (ref 5–15)
BUN: 18 mg/dL (ref 8–23)
CO2: 25 mmol/L (ref 22–32)
Calcium: 8.9 mg/dL (ref 8.9–10.3)
Chloride: 110 mmol/L (ref 98–111)
Creatinine, Ser: 0.69 mg/dL (ref 0.44–1.00)
GFR, Estimated: >60 mL/min (ref >60)
Glucose, Bld: 85 mg/dL (ref 70–99)
Potassium: 4.4 mmol/L (ref 3.5–5.1)
Sodium: 142 mmol/L (ref 135–145)
Total Bilirubin: 0.8 mg/dL (ref 0.3–1.2)
Total Protein: 6.2 g/dL — ABNORMAL LOW (ref 6.5–8.1)

## 2020-11-30 LAB — LACTATE DEHYDROGENASE: LDH: 153 U/L (ref 98–192)

## 2020-11-30 NOTE — Progress Notes (Signed)
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 Karyotype was normal. FISHstudies on 08/19/2018 revealed loss of one ATM, homologous deletion of 13q, and normal CCDN1/IGH, chromosome 12, and TP53.She received FCRx 4 cycles (last 08/06/2008). She had a partial remission. She received single agent Rituxanx 7. Treatment was held secondary to neutropenia.  She received 6 cycles of Gazyva(07/26/2016 - 01/23/2017).  Patient was started on ibrutinib in September 2020.  Her peak WBC count was 393,000 and she was also significant at that time when she has had started ibrutinib.  She has some baseline thrombocytopenia with platelet counts fluctuating between 90s to 130s.  History of B12 deficiency for which she is on oral B12   Interval history-reports doing well overall.  Tolerating ibrutinib well without any significant side effects.  Denies any skin rash or diarrhea.  Denies any heart palpitations  ECOG PS- 1 Pain scale- 0   Review of systems- Review of Systems  Constitutional: Negative for chills, fever, malaise/fatigue and weight loss.  HENT: Negative for congestion, ear discharge and nosebleeds.   Eyes: Negative for blurred vision.  Respiratory: Negative for cough, hemoptysis, sputum production, shortness of breath and wheezing.   Cardiovascular: Negative for  chest pain, palpitations, orthopnea and claudication.  Gastrointestinal: Negative for abdominal pain, blood in stool, constipation, diarrhea, heartburn, melena, nausea and vomiting.  Genitourinary: Negative for dysuria, flank pain, frequency, hematuria and urgency.  Musculoskeletal: Negative for back pain, joint pain and myalgias.  Skin: Negative for rash.  Neurological: Negative for dizziness, tingling, focal weakness, seizures, weakness and headaches.  Endo/Heme/Allergies: Does not bruise/bleed easily.  Psychiatric/Behavioral: Negative for depression and suicidal ideas. The patient does not have insomnia.       No Known Allergies   Past Medical History:  Diagnosis Date  . Anemia   . CLL (chronic lymphocytic leukemia) ([place]) 2002  . CLL (chronic lymphocytic leukemia) ([place])   . Itching   . Vitamin B 12 deficiency      Past Surgical History:  Procedure Laterality Date  . breeast mass removed  1988   benign    Social History   Socioeconomic History  . Marital status: Divorced    Spouse name: Not on file  . Number of children: 2  . Years of education: Not on file  . Highest education level: 9th grade  Occupational History  . Occupation: Retired  Tobacco Use  . Smoking status: Current Every Day Smoker    Packs/day: 0.25    Years: 56.00    Pack years: 14.00    Types: Cigarettes  . Smokeless tobacco: Never Used  Vaping Use  . Vaping Use: Never used  Substance and Sexual Activity  . Alcohol use: Yes    Alcohol/week: 1.0 standard drink    Types: 1 Glasses of wine per week    Comment: occassional  . Drug use: No  . Sexual activity: Not Currently  Other Topics Concern  . Not on  file  Social History Narrative   Pt lives alone.    Social Determinants of Health   Financial Resource Strain: High Risk  . Difficulty of Paying Living Expenses: Hard  Food Insecurity: No Food Insecurity  . Worried About Charity fundraiser in the Last Year: Never true  . Ran Out of Food  in the Last Year: Never true  Transportation Needs: No Transportation Needs  . Lack of Transportation (Medical): No  . Lack of Transportation (Non-Medical): No  Physical Activity: Inactive  . Days of Exercise per Week: 0 days  . Minutes of Exercise per Session: 0 min  Stress: No Stress Concern Present  . Feeling of Stress : Only a little  Social Connections: Socially Isolated  . Frequency of Communication with Friends and Family: More than three times a week  . Frequency of Social Gatherings with Friends and Family: Once a week  . Attends Religious Services: Never  . Active Member of Clubs or Organizations: No  . Attends Archivist Meetings: Never  . Marital Status: Divorced  Human resources officer Violence: Not At Risk  . Fear of Current or Ex-Partner: No  . Emotionally Abused: No  . Physically Abused: No  . Sexually Abused: No    Family History  Family history unknown: Yes     Current Outpatient Medications:  .  amLODipine (NORVASC) 2.5 MG tablet, Take 1 tablet (2.5 mg total) by mouth daily., Disp: 90 tablet, Rfl: 1 .  folic acid (FOLVITE) 401 MCG tablet, Take 800 mcg by mouth 3 (three) times a week. , Disp: , Rfl:  .  ibrutinib (IMBRUVICA) 420 MG TABS, Take 1 tablet by mouth daily., Disp: 28 tablet, Rfl: 1 .  IMBRUVICA 420 MG TABS, TAKE 1 TABLET BY MOUTH ONCE DAILY, Disp: 28 tablet, Rfl: 5 .  Magnesium 400 MG CAPS, Take 400 mg by mouth 3 (three) times daily., Disp: , Rfl:  .  mupirocin cream (BACTROBAN) 2 %, Apply 1 application topically 2 (two) times daily., Disp: 15 g, Rfl: 0 .  vitamin B-12 (CYANOCOBALAMIN) 1000 MCG tablet, Take 1,000 mcg by mouth. Takes three times a week, Disp: , Rfl:   Physical exam:  Vitals:   11/30/20 1113  BP: (!) 157/62  Pulse: (!) 58  Resp: 14  Temp: 97.6 F (36.4 C)  SpO2: 95%  Weight: 131 lb 6.3 oz (59.6 kg)   Physical Exam Constitutional:      General: She is not in acute distress. Cardiovascular:     Rate and Rhythm: Normal  rate and regular rhythm.     Heart sounds: Normal heart sounds.  Pulmonary:     Effort: Pulmonary effort is normal.     Breath sounds: Normal breath sounds.  Abdominal:     General: Bowel sounds are normal.     Palpations: Abdomen is soft.  Lymphadenopathy:     Comments: No palpable cervical, supraclavicular, axillary or inguinal adenopathy   Skin:    General: Skin is warm and dry.  Neurological:     Mental Status: She is alert and oriented to person, place, and time.      CMP Latest Ref Rng & Units 11/30/2020  Glucose 70 - 99 mg/dL 85  BUN 8 - 23 mg/dL 18  Creatinine 0.44 - 1.00 mg/dL 0.69  Sodium 135 - 145 mmol/L 142  Potassium 3.5 - 5.1 mmol/L 4.4  Chloride 98 - 111 mmol/L 110  CO2 22 - 32 mmol/L 25  Calcium 8.9 - 10.3 mg/dL  8.9  Total Protein 6.5 - 8.1 g/dL 6.2(L)  Total Bilirubin 0.3 - 1.2 mg/dL 0.8  Alkaline Phos 38 - 126 U/L 42  AST 15 - 41 U/L 15  ALT 0 - 44 U/L 13   CBC Latest Ref Rng & Units 11/30/2020  WBC 4.0 - 10.5 K/uL 34.3(H)  Hemoglobin 12.0 - 15.0 g/dL 12.1  Hematocrit 36.0 - 46.0 % 39.6  Platelets 150 - 400 K/uL 134(L)      Assessment and plan- Patient is a 80 y.o. female with history of stage IV CLL currently on ibrutinib here for routine follow-up  Clinically patient is doing well with no B symptoms or worsening adenopathy.  No palpable Splenomegaly on today's exam.  Her white cell count has been slowly trending down since starting ibrutinib and is down to 34 today.  Hemoglobin is staying stable between 12-13.  As compared to 2020 there has been slow improvement in her platelet count which is up to 134 today.  Plan is to continue ibrutinib until progression or toxicity.  No indication for repeat imaging at this time.  She had her last CT abdomen back in August 2019 which showed diffuse nonbulky intra-abdominal adenopathy  CBC with differential CMP and LDH in 4 months and I will see her thereafter   Visit Diagnosis 1. CLL (chronic lymphocytic  leukemia) ([place])   2. High risk medication use      Dr. Randa Evens, MD, MPH [facility] [phone] 11/30/2020 5:10 PM

## 2020-11-30 NOTE — Progress Notes (Signed)
Pt states she is no longer taking mag everyday because it seems to cause heartburn.

## 2020-12-19 ENCOUNTER — Other Ambulatory Visit (HOSPITAL_COMMUNITY): Payer: Self-pay

## 2020-12-19 ENCOUNTER — Telehealth: Payer: Self-pay | Admitting: Pharmacy Technician

## 2020-12-19 NOTE — Telephone Encounter (Signed)
Called and spoke to patient.  Explained that PAN grant did close due to non-use.  Patients insurance is paying 100% of the cost of the drug so the patient has no copay.    I will put her on our waitlist to apply for funding towards the end of the year.  Patient was very appreciative of the call and information.  [place] Patient [place] Phone [phone] Fax [phone] 12/19/2020 10:49 AM

## 2020-12-19 NOTE — Telephone Encounter (Signed)
-----   Message from Betti Cruz, RN sent at 12/19/2020  9:24 AM EDT ----- Regarding: FW: [employer] Foundation question  ----- Message ----- From: Lieutenant Diego, RN Sent: 12/19/2020   8:44 AM EDT To: Luella Cook, RN, Betti Cruz, RN Subject: [place] [employer] question                        I got a call from Baxter Flattery, Dr. Ronnald Ramp nurse, forwarding a message from this patient. The patient says that the PAN foundation has been providing her drug assistance but she just recently received notification that those benefits were no longer in place due to not meeting some criteria.   She was hoping that someone could help her with this. The # for PAN foundation is [phone].  thanks

## 2020-12-20 ENCOUNTER — Other Ambulatory Visit (HOSPITAL_COMMUNITY): Payer: Self-pay

## 2021-01-10 ENCOUNTER — Other Ambulatory Visit (HOSPITAL_COMMUNITY): Payer: Self-pay

## 2021-01-10 ENCOUNTER — Other Ambulatory Visit: Payer: Self-pay | Admitting: Oncology

## 2021-01-10 MED ORDER — IMBRUVICA 420 MG PO TABS
1.0000 | ORAL_TABLET | Freq: Every day | ORAL | 1 refills | Status: DC
  Filled 2021-01-17: qty 28, 28d supply, fill #0
  Filled 2021-02-13: qty 28, 28d supply, fill #1

## 2021-01-17 ENCOUNTER — Other Ambulatory Visit (HOSPITAL_COMMUNITY): Payer: Self-pay

## 2021-02-13 ENCOUNTER — Other Ambulatory Visit (HOSPITAL_COMMUNITY): Payer: Self-pay

## 2021-02-15 ENCOUNTER — Other Ambulatory Visit (HOSPITAL_COMMUNITY): Payer: Self-pay

## 2021-03-13 ENCOUNTER — Other Ambulatory Visit (HOSPITAL_COMMUNITY): Payer: Self-pay

## 2021-03-13 ENCOUNTER — Other Ambulatory Visit: Payer: Self-pay | Admitting: Oncology

## 2021-03-13 MED ORDER — IMBRUVICA 420 MG PO TABS
1.0000 | ORAL_TABLET | Freq: Every day | ORAL | 1 refills | Status: DC
  Filled 2021-03-13: qty 28, 28d supply, fill #0
  Filled 2021-04-10: qty 28, 28d supply, fill #1

## 2021-03-16 ENCOUNTER — Other Ambulatory Visit (HOSPITAL_COMMUNITY): Payer: Self-pay

## 2021-04-05 ENCOUNTER — Ambulatory Visit (INDEPENDENT_AMBULATORY_CARE_PROVIDER_SITE_OTHER): Payer: PPO

## 2021-04-05 ENCOUNTER — Other Ambulatory Visit: Payer: Self-pay

## 2021-04-05 ENCOUNTER — Encounter: Payer: Self-pay | Admitting: Oncology

## 2021-04-05 ENCOUNTER — Inpatient Hospital Stay (HOSPITAL_BASED_OUTPATIENT_CLINIC_OR_DEPARTMENT_OTHER): Payer: PPO | Admitting: Oncology

## 2021-04-05 ENCOUNTER — Inpatient Hospital Stay: Payer: PPO | Attending: Oncology

## 2021-04-05 VITALS — BP 111/90 | HR 84 | Temp 97.8°F | Resp 16 | Wt 126.3 lb

## 2021-04-05 DIAGNOSIS — D696 Thrombocytopenia, unspecified: Secondary | ICD-10-CM | POA: Insufficient documentation

## 2021-04-05 DIAGNOSIS — C9110 Chronic lymphocytic leukemia of B-cell type not having achieved remission: Secondary | ICD-10-CM

## 2021-04-05 DIAGNOSIS — F17210 Nicotine dependence, cigarettes, uncomplicated: Secondary | ICD-10-CM | POA: Insufficient documentation

## 2021-04-05 DIAGNOSIS — Z5111 Encounter for antineoplastic chemotherapy: Secondary | ICD-10-CM | POA: Insufficient documentation

## 2021-04-05 DIAGNOSIS — Z23 Encounter for immunization: Secondary | ICD-10-CM

## 2021-04-05 DIAGNOSIS — E538 Deficiency of other specified B group vitamins: Secondary | ICD-10-CM | POA: Insufficient documentation

## 2021-04-05 DIAGNOSIS — R5383 Other fatigue: Secondary | ICD-10-CM | POA: Insufficient documentation

## 2021-04-05 DIAGNOSIS — Z79899 Other long term (current) drug therapy: Secondary | ICD-10-CM | POA: Diagnosis not present

## 2021-04-05 LAB — COMPREHENSIVE METABOLIC PANEL
ALT: 10 U/L (ref 0–44)
AST: 15 U/L (ref 15–41)
Albumin: 4.1 g/dL (ref 3.5–5.0)
Alkaline Phosphatase: 44 U/L (ref 38–126)
Anion gap: 7 (ref 5–15)
BUN: 13 mg/dL (ref 8–23)
CO2: 26 mmol/L (ref 22–32)
Calcium: 8.7 mg/dL — ABNORMAL LOW (ref 8.9–10.3)
Chloride: 105 mmol/L (ref 98–111)
Creatinine, Ser: 0.62 mg/dL (ref 0.44–1.00)
GFR, Estimated: >60 mL/min (ref >60)
Glucose, Bld: 93 mg/dL (ref 70–99)
Potassium: 3.9 mmol/L (ref 3.5–5.1)
Sodium: 138 mmol/L (ref 135–145)
Total Bilirubin: 0.8 mg/dL (ref 0.3–1.2)
Total Protein: 6.2 g/dL — ABNORMAL LOW (ref 6.5–8.1)

## 2021-04-05 LAB — LACTATE DEHYDROGENASE: LDH: 160 U/L (ref 98–192)

## 2021-04-05 LAB — CBC WITH DIFFERENTIAL/PLATELET
Abs Immature Granulocytes: 0.09 10*3/uL — ABNORMAL HIGH (ref 0.00–0.07)
Basophils Absolute: 0.1 10*3/uL (ref 0.0–0.1)
Basophils Relative: 0 %
Eosinophils Absolute: 0 10*3/uL (ref 0.0–0.5)
Eosinophils Relative: 0 %
HCT: 40.4 % (ref 36.0–46.0)
Hemoglobin: 12.6 g/dL (ref 12.0–15.0)
Immature Granulocytes: 0 %
Lymphocytes Relative: 83 %
Lymphs Abs: 30.5 10*3/uL — ABNORMAL HIGH (ref 0.7–4.0)
MCH: 26.4 pg (ref 26.0–34.0)
MCHC: 31.2 g/dL (ref 30.0–36.0)
MCV: 84.7 fL (ref 80.0–100.0)
Monocytes Absolute: 0.6 10*3/uL (ref 0.1–1.0)
Monocytes Relative: 2 %
Neutro Abs: 5.4 10*3/uL (ref 1.7–7.7)
Neutrophils Relative %: 15 %
Platelets: 160 10*3/uL (ref 150–400)
RBC: 4.77 MIL/uL (ref 3.87–5.11)
RDW: 15.4 % (ref 11.5–15.5)
WBC: 36.8 10*3/uL — ABNORMAL HIGH (ref 4.0–10.5)
nRBC: 0 % (ref 0.0–0.2)

## 2021-04-05 NOTE — Progress Notes (Signed)
Pt doing pretty good she says she is tired all the time. She went to dentist about bad tooth and they saw red spots on roof of mouth. It does burn her there when she is eating spicy food. She rinses her  mouth with peroxide and she feels that the maybe she made it stronger that she usually does with portion of water and peroxide.

## 2021-04-06 ENCOUNTER — Other Ambulatory Visit (HOSPITAL_COMMUNITY): Payer: Self-pay

## 2021-04-06 NOTE — Progress Notes (Signed)
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 Bone marrow on 03/2008 revealed 90% cellularity with diffuse involvement by CLL.  Karyotype was normal.  FISH studies on 08/19/2018 revealed loss of one ATM, homologous deletion of 13q, and normal CCDN1/IGH, chromosome 12, and TP53.She received FCR x 4 cycles (last 08/06/2008). She had a partial remission.  She received single agent Rituxan x 7.  Treatment was held secondary to neutropenia.   She received 6 cycles of Gazyva (07/26/2016 - 01/23/2017).  Patient was started on ibrutinib in September 2020.  Her peak WBC count was 393,000 and she was also significant at that time when she has had started ibrutinib.   She has some baseline thrombocytopenia with platelet counts fluctuating between 90s to 130s.  History of B12 deficiency for which she is on oral B12    Interval history-patient reports baseline fatigue which is unchanged.  She was seen by her dentist about a week ago and was found to have mouth sores at the roof of her mouth and she was given peroxide mouthwash for the same.  Denies any diarrhea or other complaints at this time  ECOG PS- 1 Pain scale- 0   Review of systems- Review of Systems  Constitutional:  Positive for malaise/fatigue. Negative for chills, fever and weight loss.  HENT:  Negative for congestion, ear discharge and nosebleeds.   Eyes:  Negative for blurred  vision.  Respiratory:  Negative for cough, hemoptysis, sputum production, shortness of breath and wheezing.   Cardiovascular:  Negative for chest pain, palpitations, orthopnea and claudication.  Gastrointestinal:  Negative for abdominal pain, blood in stool, constipation, diarrhea, heartburn, melena, nausea and vomiting.  Genitourinary:  Negative for dysuria, flank pain, frequency, hematuria and urgency.  Musculoskeletal:  Negative for back pain, joint pain and myalgias.  Skin:  Negative for rash.  Neurological:  Negative for dizziness, tingling, focal weakness, seizures, weakness and headaches.  Endo/Heme/Allergies:  Does not bruise/bleed easily.  Psychiatric/Behavioral:  Negative for depression and suicidal ideas. The patient does not have insomnia.    No Known Allergies   Past Medical History:  Diagnosis Date   Anemia    CLL (chronic lymphocytic leukemia) ([place]) 2002   CLL (chronic lymphocytic leukemia) ([facility])    Itching    Vitamin B 12 deficiency      Past Surgical History:  Procedure Laterality Date   breeast mass removed  1988   benign    Social History   Socioeconomic History   Marital status: Divorced    Spouse name: Not on file   Number of children: 2   Years of education: Not on file   Highest education level: 9th grade  Occupational History   Occupation: Retired  Tobacco Use   Smoking status: Every Day    Packs/day: 0.25    Years: 56.00    Pack years: 14.00    Types: Cigarettes   Smokeless tobacco: Never  Vaping Use   Vaping Use: Never used  Substance and Sexual Activity   Alcohol use: Yes    Alcohol/week: 1.0 standard drink    Types: 1 Glasses of wine per week    Comment: occassional   Drug use: No   Sexual activity: Not Currently  Other Topics Concern   Not on file  Social History Narrative   Pt lives alone.    Social Determinants of Health   Financial Resource Strain: High Risk   Difficulty of Paying Living Expenses: Hard  Food Insecurity:  No Food Insecurity   Worried About Charity fundraiser in the Last Year: Never true   Ran Out of Food in the Last Year: Never true  Transportation Needs: No Transportation Needs   Lack of Transportation (Medical): No   Lack of Transportation (Non-Medical): No  Physical Activity: Inactive   Days of Exercise per Week: 0 days   Minutes of Exercise per Session: 0 min  Stress: No Stress Concern Present   Feeling of Stress : Only a little  Social Connections: Socially Isolated   Frequency of Communication with Friends and Family: More than three times a week   Frequency of Social Gatherings with Friends and Family: Once a week   Attends Religious Services: Never   Marine scientist or Organizations: No   Attends Music therapist: Never   Marital Status: Divorced  Human resources officer Violence: Not At Risk   Fear of Current or Ex-Partner: No   Emotionally Abused: No   Physically Abused: No   Sexually Abused: No    Family History  Family history unknown: Yes     Current Outpatient Medications:    amLODipine (NORVASC) 2.5 MG tablet, Take 1 tablet (2.5 mg total) by mouth daily., Disp: 90 tablet, Rfl: 1   folic acid (FOLVITE) 361 MCG tablet, Take 800 mcg by mouth 3 (three) times a week. , Disp: , Rfl:    ibrutinib (IMBRUVICA) 420 MG TABS, Take 1 tablet by mouth daily., Disp: 28 tablet, Rfl: 1   mupirocin cream (BACTROBAN) 2 %, Apply 1 application topically 2 (two) times daily., Disp: 15 g, Rfl: 0   vitamin B-12 (CYANOCOBALAMIN) 1000 MCG tablet, Take 1,000 mcg by mouth. Takes three times a week, Disp: , Rfl:   Physical exam:  Vitals:   04/05/21 0936 04/05/21 0946  BP:  111/90  Pulse:  84  Resp:  16  Temp:  97.8 F (36.6 C)  TempSrc:  Tympanic  Weight: 126 lb 5.2 oz (57.3 kg)    Physical Exam Constitutional:      General: She is not in acute distress. Cardiovascular:     Rate and Rhythm: Normal rate and regular rhythm.     Heart sounds: Normal heart sounds.   Pulmonary:     Effort: Pulmonary effort is normal.     Breath sounds: Normal breath sounds.  Abdominal:     General: Bowel sounds are normal.     Palpations: Abdomen is soft.  Lymphadenopathy:     Comments: No palpable cervical, supraclavicular, axillary or inguinal adenopathy    Skin:    General: Skin is warm and dry.  Neurological:     Mental Status: She is alert and oriented to person, place, and time.     CMP Latest Ref Rng & Units 04/05/2021  Glucose 70 - 99 mg/dL 93  BUN 8 - 23 mg/dL 13  Creatinine 0.44 - 1.00 mg/dL 0.62  Sodium 135 - 145 mmol/L 138  Potassium 3.5 - 5.1 mmol/L 3.9  Chloride 98 - 111 mmol/L 105  CO2 22 - 32 mmol/L 26  Calcium 8.9 - 10.3 mg/dL 8.7(L)  Total Protein 6.5 - 8.1 g/dL 6.2(L)  Total Bilirubin 0.3 - 1.2 mg/dL 0.8  Alkaline Phos 38 - 126 U/L 44  AST 15 - 41 U/L 15  ALT 0 - 44 U/L 10   CBC Latest Ref Rng & Units 04/05/2021  WBC 4.0 - 10.5 K/uL 36.8(H)  Hemoglobin 12.0 - 15.0 g/dL 12.6  Hematocrit 36.0 - 46.0 % 40.4  Platelets 150 - 400 K/uL 160    Assessment and plan- Patient is a 80 y.o. 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  Her white cell count was in the 200s at that time and currently down to the 30s.  Her hemoglobin is also improved from 5-12 presently and her thrombocytopenia has resolved.  She otherwise appears to be tolerating ibrutinib well.  She does have ongoing fatigue and which can be due to ibrutinib but given how well she has responded to it I would like to continue that at this time.I do not see any reason to repeat imaging at this time since clinically she has had a good response.  I will see her back in 3 months with CBC with differential CMP and LDH   Visit Diagnosis 1. CLL (chronic lymphocytic leukemia) ([place])   2. High risk medication use      Dr. Randa Evens, MD, MPH [facility] [phone] 04/06/2021 12:23  PM

## 2021-04-10 ENCOUNTER — Other Ambulatory Visit (HOSPITAL_COMMUNITY): Payer: Self-pay

## 2021-04-12 ENCOUNTER — Other Ambulatory Visit (HOSPITAL_COMMUNITY): Payer: Self-pay

## 2021-05-04 ENCOUNTER — Other Ambulatory Visit (HOSPITAL_COMMUNITY): Payer: Self-pay

## 2021-05-04 ENCOUNTER — Other Ambulatory Visit: Payer: Self-pay | Admitting: Oncology

## 2021-05-04 MED ORDER — IMBRUVICA 420 MG PO TABS
1.0000 | ORAL_TABLET | Freq: Every day | ORAL | 1 refills | Status: DC
  Filled 2021-05-04: qty 28, 28d supply, fill #0
  Filled 2021-06-05: qty 28, 28d supply, fill #1

## 2021-05-10 ENCOUNTER — Other Ambulatory Visit (HOSPITAL_COMMUNITY): Payer: Self-pay

## 2021-05-12 ENCOUNTER — Other Ambulatory Visit: Payer: Self-pay

## 2021-05-12 ENCOUNTER — Encounter: Payer: Self-pay | Admitting: Family Medicine

## 2021-05-12 ENCOUNTER — Ambulatory Visit (INDEPENDENT_AMBULATORY_CARE_PROVIDER_SITE_OTHER): Payer: PPO | Admitting: Family Medicine

## 2021-05-12 VITALS — BP 120/80 | HR 84 | Ht 67.0 in | Wt 123.0 lb

## 2021-05-12 DIAGNOSIS — Z780 Asymptomatic menopausal state: Secondary | ICD-10-CM

## 2021-05-12 DIAGNOSIS — I10 Essential (primary) hypertension: Secondary | ICD-10-CM

## 2021-05-12 MED ORDER — AMLODIPINE BESYLATE 2.5 MG PO TABS: 2.5000 mg | ORAL_TABLET | Freq: Every day | ORAL | 1 refills | Status: AC

## 2021-05-12 NOTE — Progress Notes (Signed)
Date:  05/12/2021   Name:  Maureen [name]   DOB:  Jun 05, 1941   MRN:  [id number]   Chief Complaint: Hypertension  Hypertension This is a chronic problem. The current episode started more than 1 month ago. The problem has been gradually improving since onset. The problem is controlled. Pertinent negatives include no anxiety, blurred vision, chest pain, headaches, malaise/fatigue, neck pain, orthopnea, palpitations, peripheral edema, PND, shortness of breath or sweats. There are no associated agents to hypertension. Past treatments include calcium channel blockers. The current treatment provides mild improvement. There is no history of angina, kidney disease, CAD/MI, CVA, heart failure, left ventricular hypertrophy, PVD or retinopathy. There is no history of chronic renal disease, a hypertension causing med or renovascular disease.   Lab Results  Component Value Date   CREATININE 0.62 04/05/2021   BUN 13 04/05/2021   NA 138 04/05/2021   K 3.9 04/05/2021   CL 105 04/05/2021   CO2 26 04/05/2021   No results found for: CHOL, HDL, LDLCALC, LDLDIRECT, TRIG, CHOLHDL Lab Results  Component Value Date   TSH 1.713 03/25/2019   No results found for: HGBA1C Lab Results  Component Value Date   WBC 36.8 (H) 04/05/2021   HGB 12.6 04/05/2021   HCT 40.4 04/05/2021   MCV 84.7 04/05/2021   PLT 160 04/05/2021   Lab Results  Component Value Date   ALT 10 04/05/2021   AST 15 04/05/2021   ALKPHOS 44 04/05/2021   BILITOT 0.8 04/05/2021     Review of Systems  Constitutional:  Negative for chills, fever and malaise/fatigue.  HENT:  Negative for drooling, ear discharge, ear pain and sore throat.   Eyes:  Negative for blurred vision.  Respiratory:  Negative for cough, shortness of breath and wheezing.   Cardiovascular:  Negative for chest pain, palpitations, orthopnea, leg swelling and PND.  Gastrointestinal:  Negative for abdominal pain, blood in stool, constipation, diarrhea and nausea.   Endocrine: Negative for polydipsia.  Genitourinary:  Negative for dysuria, frequency, hematuria and urgency.  Musculoskeletal:  Negative for back pain, myalgias and neck pain.  Skin:  Negative for rash.  Allergic/Immunologic: Negative for environmental allergies.  Neurological:  Negative for dizziness and headaches.  Hematological:  Does not bruise/bleed easily.  Psychiatric/Behavioral:  Negative for suicidal ideas. The patient is not nervous/anxious.    Patient Active Problem List   Diagnosis Date Noted   Paronychia of third toe of right foot 02/24/2020   Injury of right toe, initial encounter 02/24/2020   Thrombocytopenia ([place]) 10/20/2019   Hyperkalemia 03/24/2019   Anemia 03/24/2019   Goals of care, counseling/discussion 03/24/2019   Skin lesions 02/01/2019   Abnormal iron saturation 10/22/2018   B12 deficiency 09/24/2018   Renal mass, right 10/22/2017   Chemotherapy-induced neutropenia ([place]) 10/14/2017   CLL (chronic lymphocytic leukemia) ([place]) 02/28/2016    No Known Allergies  Past Surgical History:  Procedure Laterality Date   breeast mass removed  1988   benign    Social History   Tobacco Use   Smoking status: Every Day    Packs/day: 0.25    Years: 56.00    Pack years: 14.00    Types: Cigarettes   Smokeless tobacco: Never  Vaping Use   Vaping Use: Never used  Substance Use Topics   Alcohol use: Yes    Alcohol/week: 1.0 standard drink    Types: 1 Glasses of wine per week    Comment: occassional   Drug use: No  Medication list has been reviewed and updated.  Current Meds  Medication Sig   amLODipine (NORVASC) 2.5 MG tablet Take 1 tablet (2.5 mg total) by mouth daily.   folic acid (FOLVITE) 272 MCG tablet Take 800 mcg by mouth 3 (three) times a week.    ibrutinib (IMBRUVICA) 420 MG TABS Take 1 tablet by mouth daily.   mupirocin cream (BACTROBAN) 2 % Apply 1 application topically 2 (two) times daily.   vitamin B-12 (CYANOCOBALAMIN) 1000 MCG tablet  Take 1,000 mcg by mouth. Takes three times a week    PHQ 2/9 Scores 05/12/2021 11/09/2020 10/26/2020 11/04/2019  PHQ - 2 Score 0 0 0 0  PHQ- 9 Score 0 - 0 -    GAD 7 : Generalized Anxiety Score 05/12/2021 10/26/2020  Nervous, Anxious, on Edge 0 0  Control/stop worrying 0 0  Worry too much - different things 0 0  Trouble relaxing 0 0  Restless 0 0  Easily annoyed or irritable 0 0  Afraid - awful might happen 0 0  Total GAD 7 Score 0 0    BP Readings from Last 3 Encounters:  05/12/21 120/80  04/05/21 111/90  11/30/20 (!) 157/62    Physical Exam Vitals and nursing note reviewed.  Constitutional:      General: She is not in acute distress.    Appearance: She is not diaphoretic.  HENT:     Head: Normocephalic and atraumatic.     Right Ear: Tympanic membrane, ear canal and external ear normal.     Left Ear: Tympanic membrane, ear canal and external ear normal.     Nose: Nose normal. No congestion or rhinorrhea.  Eyes:     General:        Right eye: No discharge.        Left eye: No discharge.     Conjunctiva/sclera: Conjunctivae normal.     Pupils: Pupils are equal, round, and reactive to light.  Neck:     Thyroid: No thyromegaly.     Vascular: No JVD.  Cardiovascular:     Rate and Rhythm: Normal rate and regular rhythm.     Heart sounds: Normal heart sounds. No murmur heard.   No friction rub. No gallop.  Pulmonary:     Effort: Pulmonary effort is normal.     Breath sounds: Normal breath sounds. No wheezing or rhonchi.  Abdominal:     General: Bowel sounds are normal.     Palpations: Abdomen is soft. There is no mass.     Tenderness: There is no abdominal tenderness. There is no guarding.  Musculoskeletal:        General: Normal range of motion.     Cervical back: Normal range of motion and neck supple.  Lymphadenopathy:     Cervical: No cervical adenopathy.  Skin:    General: Skin is warm and dry.  Neurological:     Mental Status: She is alert.     Deep Tendon  Reflexes: Reflexes are normal and symmetric.    Wt Readings from Last 3 Encounters:  05/12/21 123 lb (55.8 kg)  04/05/21 126 lb 5.2 oz (57.3 kg)  11/30/20 131 lb 6.3 oz (59.6 kg)    BP 120/80   Pulse 84   Ht 5\' 7"  (1.702 m)   Wt 123 lb (55.8 kg)   BMI 19.26 kg/m   Assessment and Plan:  1. Primary hypertension Chronic.  Controlled.  Stable.  Continue amlodipine 2.5 mg once a day.  And will  recheck in 6 months - amLODipine (NORVASC) 2.5 MG tablet; Take 1 tablet (2.5 mg total) by mouth daily.  Dispense: 90 tablet; Refill: 1  2. Postmenopausal Discussed with patient and bone density is ordered for evaluation of osteopenia/porosis. - DG Bone Density; Future

## 2021-06-05 ENCOUNTER — Other Ambulatory Visit (HOSPITAL_COMMUNITY): Payer: Self-pay

## 2021-06-06 ENCOUNTER — Other Ambulatory Visit (HOSPITAL_COMMUNITY): Payer: Self-pay

## 2021-06-12 ENCOUNTER — Ambulatory Visit
Admission: RE | Admit: 2021-06-12 | Discharge: 2021-06-12 | Disposition: A | Discharge status: Home / Self Care | Payer: PPO | Source: Ambulatory Visit | Attending: Family Medicine | Admitting: Family Medicine

## 2021-06-12 ENCOUNTER — Other Ambulatory Visit: Payer: Self-pay

## 2021-06-12 DIAGNOSIS — Z780 Asymptomatic menopausal state: Secondary | ICD-10-CM | POA: Diagnosis not present

## 2021-06-12 DIAGNOSIS — M810 Age-related osteoporosis without current pathological fracture: Secondary | ICD-10-CM | POA: Diagnosis not present

## 2021-06-13 ENCOUNTER — Other Ambulatory Visit: Payer: Self-pay

## 2021-06-13 DIAGNOSIS — M810 Age-related osteoporosis without current pathological fracture: Secondary | ICD-10-CM | POA: Insufficient documentation

## 2021-06-13 MED ORDER — CALCIUM 1200 1200-1000 MG-UNIT PO CHEW: 1.0000 | CHEWABLE_TABLET | Freq: Every day | ORAL | 1 refills | Status: AC

## 2021-06-13 MED ORDER — VITAMIN D3 20 MCG (800 UNIT) PO TABS: 1.0000 | ORAL_TABLET | Freq: Every day | ORAL | 1 refills | Status: AC

## 2021-06-13 MED ORDER — ALENDRONATE SODIUM 70 MG PO TABS: 70.0000 mg | ORAL_TABLET | ORAL | 1 refills | Status: AC

## 2021-06-29 ENCOUNTER — Other Ambulatory Visit (HOSPITAL_COMMUNITY): Payer: Self-pay

## 2021-06-29 ENCOUNTER — Other Ambulatory Visit: Payer: Self-pay | Admitting: Oncology

## 2021-06-29 MED ORDER — IMBRUVICA 420 MG PO TABS
1.0000 | ORAL_TABLET | Freq: Every day | ORAL | 1 refills | Status: DC
  Filled 2021-06-29: qty 28, 28d supply, fill #0
  Filled 2021-07-28: qty 28, 28d supply, fill #1

## 2021-07-04 ENCOUNTER — Other Ambulatory Visit (HOSPITAL_COMMUNITY): Payer: Self-pay

## 2021-07-12 ENCOUNTER — Encounter: Payer: Self-pay | Admitting: Oncology

## 2021-07-12 ENCOUNTER — Inpatient Hospital Stay (HOSPITAL_BASED_OUTPATIENT_CLINIC_OR_DEPARTMENT_OTHER): Payer: PPO | Admitting: Oncology

## 2021-07-12 ENCOUNTER — Other Ambulatory Visit: Payer: Self-pay

## 2021-07-12 ENCOUNTER — Inpatient Hospital Stay: Payer: PPO | Attending: Oncology

## 2021-07-12 VITALS — BP 117/69 | HR 53 | Temp 99.4°F | Resp 16 | Wt 117.9 lb

## 2021-07-12 DIAGNOSIS — R634 Abnormal weight loss: Secondary | ICD-10-CM | POA: Insufficient documentation

## 2021-07-12 DIAGNOSIS — D649 Anemia, unspecified: Secondary | ICD-10-CM | POA: Insufficient documentation

## 2021-07-12 DIAGNOSIS — C9110 Chronic lymphocytic leukemia of B-cell type not having achieved remission: Secondary | ICD-10-CM | POA: Diagnosis not present

## 2021-07-12 DIAGNOSIS — Z79899 Other long term (current) drug therapy: Secondary | ICD-10-CM | POA: Insufficient documentation

## 2021-07-12 DIAGNOSIS — F17210 Nicotine dependence, cigarettes, uncomplicated: Secondary | ICD-10-CM | POA: Diagnosis not present

## 2021-07-12 DIAGNOSIS — D696 Thrombocytopenia, unspecified: Secondary | ICD-10-CM | POA: Diagnosis not present

## 2021-07-12 DIAGNOSIS — E538 Deficiency of other specified B group vitamins: Secondary | ICD-10-CM | POA: Diagnosis not present

## 2021-07-12 DIAGNOSIS — R5383 Other fatigue: Secondary | ICD-10-CM | POA: Insufficient documentation

## 2021-07-12 DIAGNOSIS — R7402 Elevation of levels of lactic acid dehydrogenase (LDH): Secondary | ICD-10-CM | POA: Insufficient documentation

## 2021-07-12 LAB — COMPREHENSIVE METABOLIC PANEL
ALT: 9 U/L (ref 0–44)
AST: 16 U/L (ref 15–41)
Albumin: 3.7 g/dL (ref 3.5–5.0)
Alkaline Phosphatase: 50 U/L (ref 38–126)
Anion gap: 10 (ref 5–15)
BUN: 15 mg/dL (ref 8–23)
CO2: 26 mmol/L (ref 22–32)
Calcium: 8.4 mg/dL — ABNORMAL LOW (ref 8.9–10.3)
Chloride: 98 mmol/L (ref 98–111)
Creatinine, Ser: 0.71 mg/dL (ref 0.44–1.00)
GFR, Estimated: >60 mL/min (ref >60)
Glucose, Bld: 94 mg/dL (ref 70–99)
Potassium: 3.6 mmol/L (ref 3.5–5.1)
Sodium: 134 mmol/L — ABNORMAL LOW (ref 135–145)
Total Bilirubin: 0.5 mg/dL (ref 0.3–1.2)
Total Protein: 5.8 g/dL — ABNORMAL LOW (ref 6.5–8.1)

## 2021-07-12 LAB — CBC WITH DIFFERENTIAL/PLATELET
Abs Immature Granulocytes: 0.06 10*3/uL (ref 0.00–0.07)
Basophils Absolute: 0 10*3/uL (ref 0.0–0.1)
Basophils Relative: 0 %
Eosinophils Absolute: 0 10*3/uL (ref 0.0–0.5)
Eosinophils Relative: 0 %
HCT: 37.8 % (ref 36.0–46.0)
Hemoglobin: 11.8 g/dL — ABNORMAL LOW (ref 12.0–15.0)
Immature Granulocytes: 0 %
Lymphocytes Relative: 86 %
Lymphs Abs: 20 10*3/uL — ABNORMAL HIGH (ref 0.7–4.0)
MCH: 25.1 pg — ABNORMAL LOW (ref 26.0–34.0)
MCHC: 31.2 g/dL (ref 30.0–36.0)
MCV: 80.4 fL (ref 80.0–100.0)
Monocytes Absolute: 0.5 10*3/uL (ref 0.1–1.0)
Monocytes Relative: 2 %
Neutro Abs: 2.9 10*3/uL (ref 1.7–7.7)
Neutrophils Relative %: 12 %
Platelets: 161 10*3/uL (ref 150–400)
RBC: 4.7 MIL/uL (ref 3.87–5.11)
RDW: 16.1 % — ABNORMAL HIGH (ref 11.5–15.5)
Smear Review: NORMAL
WBC: 23.5 10*3/uL — ABNORMAL HIGH (ref 4.0–10.5)
nRBC: 0 % (ref 0.0–0.2)

## 2021-07-12 LAB — LACTATE DEHYDROGENASE: LDH: 257 U/L — ABNORMAL HIGH (ref 98–192)

## 2021-07-12 NOTE — Progress Notes (Signed)
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 Bone marrow on 03/2008 revealed 90% cellularity with diffuse involvement by CLL.  Karyotype was normal.  FISH studies on 08/19/2018 revealed loss of one ATM, homologous deletion of 13q, and normal CCDN1/IGH, chromosome 12, and TP53.She received FCR x 4 cycles (last 08/06/2008). She had a partial remission.  She received single agent Rituxan x 7.  Treatment was held secondary to neutropenia.   She received 6 cycles of Gazyva (07/26/2016 - 01/23/2017).  Patient was started on ibrutinib in September 2020.  Her peak WBC count was 393,000 and she was also significant at that time when she has had started ibrutinib.   She has some baseline thrombocytopenia with platelet counts fluctuating between 90s to 130s.  History of B12 deficiency for which she is on oral B12  Interval history-she is here for a routine 87-monthassessment for CLL on ibrutinib.  She was evaluated by her PCP in the interim for hypertension.  She is stable on amlodipine 2.5 mg once per day.  Reports drinking plenty fluids but appetite has been slightly lower than usual.  Has lost 6 pounds since October.  Has chronic stable fatigue.  She takes naps during the day.  ECOG PS- 1 Pain scale- 0   Review of systems- Review of Systems  Constitutional:  Positive for malaise/fatigue and weight loss. Negative for chills and fever.   HENT:  Negative for congestion, ear discharge and nosebleeds.   Eyes:  Negative for blurred vision.  Respiratory:  Negative for cough, hemoptysis, sputum production, shortness of breath and wheezing.   Cardiovascular:  Negative for chest pain, palpitations, orthopnea and claudication.  Gastrointestinal:  Negative for abdominal pain, blood in stool, constipation, diarrhea, heartburn, melena, nausea and vomiting.  Genitourinary:  Negative for dysuria, flank pain, frequency, hematuria and urgency.  Musculoskeletal:  Negative for back pain, joint pain and myalgias.  Skin:  Negative for rash.  Neurological:  Negative for dizziness, tingling, focal weakness, seizures, weakness and headaches.  Endo/Heme/Allergies:  Does not bruise/bleed easily.  Psychiatric/Behavioral:  Negative for depression and suicidal ideas. The patient does not have insomnia.    No Known Allergies   Past Medical History:  Diagnosis Date   Anemia    [place]   CLL (chronic lymphocytic leukemia) ([facility])    Itching    Vitamin B 12 deficiency      Past Surgical History:  Procedure Laterality Date   breeast mass removed  1988   benign    Social History   Socioeconomic History   Marital status: Divorced    Spouse name: Not on file   Number of children: 2   Years of education: Not on file   Highest education level: 9th grade  Occupational History   Occupation: Retired  Tobacco Use   Smoking status: Every Day    Packs/day: 0.25    Years: 56.00    Pack years: 14.00    Types:  Cigarettes   Smokeless tobacco: Never  Vaping Use   Vaping Use: Never used  Substance and Sexual Activity   Alcohol use: Yes    Alcohol/week: 1.0 standard drink    Types: 1 Glasses of wine per week    Comment: occassional   Drug use: No   Sexual activity: Not Currently  Other Topics Concern   Not on file  Social History Narrative   Pt lives alone.    Social Determinants of Health    Financial Resource Strain: High Risk   Difficulty of Paying Living Expenses: Hard  Food Insecurity: No Food Insecurity   Worried About Charity fundraiser in the Last Year: Never true   Ran Out of Food in the Last Year: Never true  Transportation Needs: No Transportation Needs   Lack of Transportation (Medical): No   Lack of Transportation (Non-Medical): No  Physical Activity: Inactive   Days of Exercise per Week: 0 days   Minutes of Exercise per Session: 0 min  Stress: No Stress Concern Present   Feeling of Stress : Only a little  Social Connections: Socially Isolated   Frequency of Communication with Friends and Family: More than three times a week   Frequency of Social Gatherings with Friends and Family: Once a week   Attends Religious Services: Never   Marine scientist or Organizations: No   Attends Music therapist: Never   Marital Status: Divorced  Human resources officer Violence: Not At Risk   Fear of Current or Ex-Partner: No   Emotionally Abused: No   Physically Abused: No   Sexually Abused: No    Family History  Family history unknown: Yes     Current Outpatient Medications:    alendronate (FOSAMAX) 70 MG tablet, Take 1 tablet (70 mg total) by mouth every 7 (seven) days. Take with a full glass of water on an empty stomach., Disp: 12 tablet, Rfl: 1   amLODipine (NORVASC) 2.5 MG tablet, Take 1 tablet (2.5 mg total) by mouth daily., Disp: 90 tablet, Rfl: 1   Calcium Carbonate-Vit D-Min (CALCIUM 1200) 1200-1000 MG-UNIT CHEW, Chew 1 tablet by mouth daily., Disp: 90 tablet, Rfl: 1   Cholecalciferol (VITAMIN D3) 20 MCG (800 UNIT) TABS, Take 1 tablet by mouth daily., Disp: 90 tablet, Rfl: 1   folic acid (FOLVITE) 831 MCG tablet, Take 800 mcg by mouth 3 (three) times a week. , Disp: , Rfl:    ibrutinib (IMBRUVICA) 420 MG TABS, Take 1 tablet by mouth daily., Disp: 28 tablet, Rfl: 1   mupirocin cream (BACTROBAN) 2 %, Apply 1 application topically 2 (two) times  daily., Disp: 15 g, Rfl: 0   vitamin B-12 (CYANOCOBALAMIN) 1000 MCG tablet, Take 1,000 mcg by mouth. Takes three times a week, Disp: , Rfl:   Physical exam:  Vitals:   07/12/21 1001  BP: 117/69  Pulse: (!) 53  Resp: 16  Temp: 99.4 F (37.4 C)  TempSrc: Tympanic  SpO2: 99%  Weight: 117 lb 14.4 oz (53.5 kg)   Physical Exam Constitutional:      Appearance: Normal appearance.  HENT:     Head: Normocephalic and atraumatic.  Eyes:     Pupils: Pupils are equal, round, and reactive to light.  Cardiovascular:     Rate and Rhythm: Normal rate and regular rhythm.     Heart sounds: Normal heart sounds. No murmur heard. Pulmonary:     Effort: Pulmonary effort is normal.     Breath sounds: Normal breath  sounds. No wheezing.  Abdominal:     General: Bowel sounds are normal. There is no distension.     Palpations: Abdomen is soft.     Tenderness: There is no abdominal tenderness.  Musculoskeletal:        General: Normal range of motion.     Cervical back: Normal range of motion.  Lymphadenopathy:     Comments: No palpable Lymphadenopathy    Skin:    General: Skin is warm and dry.     Findings: No rash.  Neurological:     Mental Status: She is alert and oriented to person, place, and time.  Psychiatric:        Judgment: Judgment normal.   CMP Latest Ref Rng & Units 07/12/2021  Glucose 70 - 99 mg/dL 94  BUN 8 - 23 mg/dL 15  Creatinine 0.44 - 1.00 mg/dL 0.71  Sodium 135 - 145 mmol/L 134(L)  Potassium 3.5 - 5.1 mmol/L 3.6  Chloride 98 - 111 mmol/L 98  CO2 22 - 32 mmol/L 26  Calcium 8.9 - 10.3 mg/dL 8.4(L)  Total Protein 6.5 - 8.1 g/dL 5.8(L)  Total Bilirubin 0.3 - 1.2 mg/dL 0.5  Alkaline Phos 38 - 126 U/L 50  AST 15 - 41 U/L 16  ALT 0 - 44 U/L 9   CBC Latest Ref Rng & Units 07/12/2021  WBC 4.0 - 10.5 K/uL 23.5(H)  Hemoglobin 12.0 - 15.0 g/dL 11.8(L)  Hematocrit 36.0 - 46.0 % 37.8  Platelets 150 - 400 K/uL 161    Assessment and plan- Patient is a 80 y.o. 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   She continues to be stable on ibrutinib.  White count continues to decline and is 23.5 (36.8).  Presently her hemoglobin is slightly lower then before but overall stable.  Platelet count is stable.  LDH is slightly elevated at 257 today.  Monitor for now.  She continues to tolerate ibrutinib with only mild side effects.  Fatigue-multifactorial but improved and have likely contributing.  Weight Loss- Discussed high calories, high protein foods including smoothies and yogurts. May need a dietician consult.   Disposition-Return to clinic in 3 months with labs (CBC with differential, CMP and LDH) and see Dr. Janese Banks.  I spent 25 minutes dedicated to the care of this patient (face-to-face and non-face-to-face) on the date of the encounter to include what is described in the assessment and plan.  Visit Diagnosis 1. CLL (chronic lymphocytic leukemia) ([place])    Faythe Casa, NP 07/12/2021 10:31 AM

## 2021-07-12 NOTE — Progress Notes (Signed)
Patient here for follow up she has no questions or concerns today.

## 2021-07-28 ENCOUNTER — Other Ambulatory Visit (HOSPITAL_COMMUNITY): Payer: Self-pay

## 2021-08-02 ENCOUNTER — Other Ambulatory Visit (HOSPITAL_COMMUNITY): Payer: Self-pay

## 2021-08-24 ENCOUNTER — Other Ambulatory Visit (HOSPITAL_COMMUNITY): Payer: Self-pay

## 2021-08-25 ENCOUNTER — Other Ambulatory Visit (HOSPITAL_COMMUNITY): Payer: Self-pay

## 2021-08-25 ENCOUNTER — Other Ambulatory Visit: Payer: Self-pay | Admitting: Nurse Practitioner

## 2021-08-29 ENCOUNTER — Encounter: Payer: Self-pay | Admitting: Radiology

## 2021-08-29 ENCOUNTER — Inpatient Hospital Stay: Payer: Medicare Other

## 2021-08-29 ENCOUNTER — Encounter: Payer: Self-pay | Admitting: Family Medicine

## 2021-08-29 ENCOUNTER — Encounter: Payer: Self-pay | Admitting: Emergency Medicine

## 2021-08-29 ENCOUNTER — Other Ambulatory Visit (HOSPITAL_COMMUNITY): Payer: Self-pay

## 2021-08-29 ENCOUNTER — Other Ambulatory Visit: Payer: Self-pay | Admitting: Oncology

## 2021-08-29 ENCOUNTER — Ambulatory Visit (INDEPENDENT_AMBULATORY_CARE_PROVIDER_SITE_OTHER): Payer: Medicare Other | Admitting: Family Medicine

## 2021-08-29 ENCOUNTER — Emergency Department: Payer: Medicare Other

## 2021-08-29 ENCOUNTER — Inpatient Hospital Stay
Admission: EM | Admit: 2021-08-29 | Discharge: 2021-09-02 | DRG: 987 | Disposition: A | Discharge status: Home Health | Payer: Medicare Other | Source: Ambulatory Visit | Attending: Student in an Organized Health Care Education/Training Program | Admitting: Student in an Organized Health Care Education/Training Program

## 2021-08-29 ENCOUNTER — Other Ambulatory Visit: Payer: Self-pay

## 2021-08-29 VITALS — BP 142/86 | HR 124 | Temp 97.6°F | Ht 67.0 in | Wt 108.0 lb

## 2021-08-29 DIAGNOSIS — Z79899 Other long term (current) drug therapy: Secondary | ICD-10-CM

## 2021-08-29 DIAGNOSIS — R599 Enlarged lymph nodes, unspecified: Secondary | ICD-10-CM | POA: Diagnosis not present

## 2021-08-29 DIAGNOSIS — R0602 Shortness of breath: Secondary | ICD-10-CM

## 2021-08-29 DIAGNOSIS — I700 Atherosclerosis of aorta: Secondary | ICD-10-CM | POA: Diagnosis not present

## 2021-08-29 DIAGNOSIS — R590 Localized enlarged lymph nodes: Secondary | ICD-10-CM | POA: Diagnosis not present

## 2021-08-29 DIAGNOSIS — R6510 Systemic inflammatory response syndrome (SIRS) of non-infectious origin without acute organ dysfunction: Secondary | ICD-10-CM | POA: Diagnosis not present

## 2021-08-29 DIAGNOSIS — R059 Cough, unspecified: Secondary | ICD-10-CM | POA: Diagnosis not present

## 2021-08-29 DIAGNOSIS — Z9889 Other specified postprocedural states: Secondary | ICD-10-CM | POA: Diagnosis not present

## 2021-08-29 DIAGNOSIS — J90 Pleural effusion, not elsewhere classified: Secondary | ICD-10-CM | POA: Diagnosis not present

## 2021-08-29 DIAGNOSIS — R918 Other nonspecific abnormal finding of lung field: Secondary | ICD-10-CM | POA: Diagnosis present

## 2021-08-29 DIAGNOSIS — J910 Malignant pleural effusion: Secondary | ICD-10-CM | POA: Diagnosis present

## 2021-08-29 DIAGNOSIS — C771 Secondary and unspecified malignant neoplasm of intrathoracic lymph nodes: Secondary | ICD-10-CM | POA: Diagnosis present

## 2021-08-29 DIAGNOSIS — J432 Centrilobular emphysema: Secondary | ICD-10-CM | POA: Diagnosis not present

## 2021-08-29 DIAGNOSIS — C770 Secondary and unspecified malignant neoplasm of lymph nodes of head, face and neck: Secondary | ICD-10-CM | POA: Diagnosis not present

## 2021-08-29 DIAGNOSIS — M545 Low back pain, unspecified: Secondary | ICD-10-CM

## 2021-08-29 DIAGNOSIS — R000 Tachycardia, unspecified: Secondary | ICD-10-CM

## 2021-08-29 DIAGNOSIS — Z87891 Personal history of nicotine dependence: Secondary | ICD-10-CM

## 2021-08-29 DIAGNOSIS — R051 Acute cough: Secondary | ICD-10-CM

## 2021-08-29 DIAGNOSIS — Z8679 Personal history of other diseases of the circulatory system: Secondary | ICD-10-CM | POA: Diagnosis not present

## 2021-08-29 DIAGNOSIS — R0603 Acute respiratory distress: Secondary | ICD-10-CM

## 2021-08-29 DIAGNOSIS — C9110 Chronic lymphocytic leukemia of B-cell type not having achieved remission: Secondary | ICD-10-CM | POA: Diagnosis not present

## 2021-08-29 DIAGNOSIS — I7143 Infrarenal abdominal aortic aneurysm, without rupture: Secondary | ICD-10-CM | POA: Diagnosis not present

## 2021-08-29 DIAGNOSIS — R0689 Other abnormalities of breathing: Secondary | ICD-10-CM | POA: Diagnosis not present

## 2021-08-29 DIAGNOSIS — I4891 Unspecified atrial fibrillation: Secondary | ICD-10-CM | POA: Diagnosis not present

## 2021-08-29 DIAGNOSIS — M4126 Other idiopathic scoliosis, lumbar region: Secondary | ICD-10-CM | POA: Diagnosis not present

## 2021-08-29 DIAGNOSIS — J189 Pneumonia, unspecified organism: Secondary | ICD-10-CM | POA: Diagnosis present

## 2021-08-29 DIAGNOSIS — J9601 Acute respiratory failure with hypoxia: Secondary | ICD-10-CM | POA: Diagnosis not present

## 2021-08-29 DIAGNOSIS — Z6820 Body mass index (BMI) 20.0-20.9, adult: Secondary | ICD-10-CM

## 2021-08-29 DIAGNOSIS — C787 Secondary malignant neoplasm of liver and intrahepatic bile duct: Secondary | ICD-10-CM | POA: Diagnosis present

## 2021-08-29 DIAGNOSIS — D638 Anemia in other chronic diseases classified elsewhere: Secondary | ICD-10-CM | POA: Diagnosis present

## 2021-08-29 DIAGNOSIS — N3091 Cystitis, unspecified with hematuria: Secondary | ICD-10-CM | POA: Diagnosis present

## 2021-08-29 DIAGNOSIS — E876 Hypokalemia: Secondary | ICD-10-CM | POA: Diagnosis present

## 2021-08-29 DIAGNOSIS — E440 Moderate protein-calorie malnutrition: Secondary | ICD-10-CM | POA: Diagnosis present

## 2021-08-29 DIAGNOSIS — Z48812 Encounter for surgical aftercare following surgery on the circulatory system: Secondary | ICD-10-CM | POA: Diagnosis not present

## 2021-08-29 DIAGNOSIS — R109 Unspecified abdominal pain: Secondary | ICD-10-CM | POA: Diagnosis not present

## 2021-08-29 DIAGNOSIS — C3401 Malignant neoplasm of right main bronchus: Secondary | ICD-10-CM | POA: Diagnosis present

## 2021-08-29 DIAGNOSIS — I10 Essential (primary) hypertension: Secondary | ICD-10-CM | POA: Diagnosis present

## 2021-08-29 DIAGNOSIS — M419 Scoliosis, unspecified: Secondary | ICD-10-CM | POA: Diagnosis present

## 2021-08-29 DIAGNOSIS — R079 Chest pain, unspecified: Secondary | ICD-10-CM | POA: Diagnosis not present

## 2021-08-29 DIAGNOSIS — J439 Emphysema, unspecified: Secondary | ICD-10-CM | POA: Diagnosis not present

## 2021-08-29 DIAGNOSIS — I480 Paroxysmal atrial fibrillation: Secondary | ICD-10-CM | POA: Diagnosis not present

## 2021-08-29 DIAGNOSIS — Z20822 Contact with and (suspected) exposure to covid-19: Secondary | ICD-10-CM | POA: Diagnosis present

## 2021-08-29 DIAGNOSIS — M47816 Spondylosis without myelopathy or radiculopathy, lumbar region: Secondary | ICD-10-CM | POA: Diagnosis not present

## 2021-08-29 DIAGNOSIS — Z7983 Long term (current) use of bisphosphonates: Secondary | ICD-10-CM

## 2021-08-29 LAB — CBC WITH DIFFERENTIAL/PLATELET
Abs Immature Granulocytes: 0.1 10*3/uL — ABNORMAL HIGH (ref 0.00–0.07)
Basophils Absolute: 0.1 10*3/uL (ref 0.0–0.1)
Basophils Relative: 0 %
Eosinophils Absolute: 0 10*3/uL (ref 0.0–0.5)
Eosinophils Relative: 0 %
HCT: 37.5 % (ref 36.0–46.0)
Hemoglobin: 11 g/dL — ABNORMAL LOW (ref 12.0–15.0)
Immature Granulocytes: 0 %
Lymphocytes Relative: 81 %
Lymphs Abs: 29.2 10*3/uL — ABNORMAL HIGH (ref 0.7–4.0)
MCH: 23.4 pg — ABNORMAL LOW (ref 26.0–34.0)
MCHC: 29.3 g/dL — ABNORMAL LOW (ref 30.0–36.0)
MCV: 79.6 fL — ABNORMAL LOW (ref 80.0–100.0)
Monocytes Absolute: 0.7 10*3/uL (ref 0.1–1.0)
Monocytes Relative: 2 %
Neutro Abs: 6.2 10*3/uL (ref 1.7–7.7)
Neutrophils Relative %: 17 %
Platelets: 231 10*3/uL (ref 150–400)
RBC: 4.71 MIL/uL (ref 3.87–5.11)
RDW: 17.5 % — ABNORMAL HIGH (ref 11.5–15.5)
Smear Review: NORMAL
WBC: 36.3 10*3/uL — ABNORMAL HIGH (ref 4.0–10.5)
nRBC: 0 % (ref 0.0–0.2)

## 2021-08-29 LAB — COMPREHENSIVE METABOLIC PANEL
ALT: 12 U/L (ref 0–44)
AST: 21 U/L (ref 15–41)
Albumin: 2.9 g/dL — ABNORMAL LOW (ref 3.5–5.0)
Alkaline Phosphatase: 55 U/L (ref 38–126)
Anion gap: 11 (ref 5–15)
BUN: 22 mg/dL (ref 8–23)
CO2: 19 mmol/L — ABNORMAL LOW (ref 22–32)
Calcium: 8.1 mg/dL — ABNORMAL LOW (ref 8.9–10.3)
Chloride: 107 mmol/L (ref 98–111)
Creatinine, Ser: 0.51 mg/dL (ref 0.44–1.00)
GFR, Estimated: >60 mL/min (ref >60)
Glucose, Bld: 105 mg/dL — ABNORMAL HIGH (ref 70–99)
Potassium: 3.6 mmol/L (ref 3.5–5.1)
Sodium: 137 mmol/L (ref 135–145)
Total Bilirubin: 1 mg/dL (ref 0.3–1.2)
Total Protein: 5.2 g/dL — ABNORMAL LOW (ref 6.5–8.1)

## 2021-08-29 LAB — PROCALCITONIN: Procalcitonin: <0.1 ng/mL

## 2021-08-29 LAB — RESP PANEL BY RT-PCR (FLU A&B, COVID) ARPGX2
Influenza A by PCR: NEGATIVE
Influenza B by PCR: NEGATIVE
SARS Coronavirus 2 by RT PCR: NEGATIVE

## 2021-08-29 LAB — URINALYSIS, COMPLETE (UACMP) WITH MICROSCOPIC
Bilirubin Urine: NEGATIVE
Glucose, UA: NEGATIVE mg/dL
Ketones, ur: 5 mg/dL — AB
Nitrite: NEGATIVE
Protein, ur: 100 mg/dL — AB
Specific Gravity, Urine: 1.024 (ref 1.005–1.030)
pH: 5 (ref 5.0–8.0)

## 2021-08-29 LAB — LACTIC ACID, PLASMA
Lactic Acid, Venous: 1.3 mmol/L (ref 0.5–1.9)
Lactic Acid, Venous: 1.3 mmol/L (ref 0.5–1.9)

## 2021-08-29 LAB — APTT: aPTT: 31 seconds (ref 24–36)

## 2021-08-29 LAB — PROTIME-INR
INR: 1.1 (ref 0.8–1.2)
Prothrombin Time: 13.9 seconds (ref 11.4–15.2)

## 2021-08-29 LAB — MAGNESIUM: Magnesium: 1.8 mg/dL (ref 1.7–2.4)

## 2021-08-29 LAB — PATHOLOGIST SMEAR REVIEW

## 2021-08-29 LAB — LACTATE DEHYDROGENASE: LDH: 464 U/L — ABNORMAL HIGH (ref 98–192)

## 2021-08-29 LAB — TROPONIN I (HIGH SENSITIVITY)
Troponin I (High Sensitivity): 9 ng/L (ref <18)
Troponin I (High Sensitivity): 9 ng/L (ref <18)

## 2021-08-29 MED ORDER — IBRUTINIB 420 MG PO TABS: 420.0000 mg | ORAL_TABLET | Freq: Every day | ORAL | Status: DC

## 2021-08-29 MED ORDER — IPRATROPIUM-ALBUTEROL 0.5-2.5 (3) MG/3ML IN SOLN
9.0000 mL | Freq: Once | RESPIRATORY_TRACT | Status: AC
  Administered 2021-08-29: 9 mL via RESPIRATORY_TRACT
  Filled 2021-08-29: qty 3

## 2021-08-29 MED ORDER — LACTATED RINGERS IV BOLUS
1000.0000 mL | Freq: Once | INTRAVENOUS | Status: AC
Start: 2021-08-29 — End: 2021-08-29
  Administered 2021-08-29: 1000 mL via INTRAVENOUS

## 2021-08-29 MED ORDER — IOHEXOL 300 MG/ML  SOLN
50.0000 mL | Freq: Once | INTRAMUSCULAR | Status: DC | PRN
  Filled 2021-08-29: qty 50

## 2021-08-29 MED ORDER — METOPROLOL TARTRATE 5 MG/5ML IV SOLN
5.0000 mg | Freq: Once | INTRAVENOUS | Status: AC
  Administered 2021-08-29: 23:00:00 5 mg via INTRAVENOUS
  Filled 2021-08-29: qty 5

## 2021-08-29 MED ORDER — GUAIFENESIN ER 600 MG PO TB12
600.0000 mg | ORAL_TABLET | Freq: Two times a day (BID) | ORAL | Status: DC
  Administered 2021-08-29 – 2021-09-02 (×8): 600 mg via ORAL
  Filled 2021-08-29 (×8): qty 1

## 2021-08-29 MED ORDER — AMLODIPINE BESYLATE 5 MG PO TABS
2.5000 mg | ORAL_TABLET | Freq: Every day | ORAL | Status: DC
  Administered 2021-08-30 – 2021-09-02 (×4): 2.5 mg via ORAL
  Filled 2021-08-29 (×4): qty 1

## 2021-08-29 MED ORDER — VITAMIN D 25 MCG (1000 UNIT) PO TABS
ORAL_TABLET | Freq: Every day | ORAL | Status: DC
  Administered 2021-08-30 – 2021-09-02 (×2): 1000 [IU] via ORAL
  Filled 2021-08-29 (×3): qty 1

## 2021-08-29 MED ORDER — SODIUM CHLORIDE 0.9 % IV SOLN
500.0000 mg | INTRAVENOUS | Status: AC
  Administered 2021-08-30 – 2021-08-31 (×2): 500 mg via INTRAVENOUS
  Filled 2021-08-29 (×2): qty 500

## 2021-08-29 MED ORDER — OYSTER SHELL CALCIUM/D3 500-5 MG-MCG PO TABS
1.0000 | ORAL_TABLET | Freq: Every day | ORAL | Status: DC
  Administered 2021-08-30 – 2021-09-02 (×4): 1 via ORAL
  Filled 2021-08-29 (×4): qty 1

## 2021-08-29 MED ORDER — SODIUM CHLORIDE 0.9 % IV SOLN
1.0000 g | INTRAVENOUS | Status: DC
  Administered 2021-08-30 – 2021-09-01 (×3): 1 g via INTRAVENOUS
  Filled 2021-08-29 (×3): qty 1
  Filled 2021-08-29: qty 10

## 2021-08-29 MED ORDER — CEFTRIAXONE SODIUM 1 G IJ SOLR
1.0000 g | Freq: Once | INTRAMUSCULAR | Status: AC
  Administered 2021-08-29: 1 g via INTRAVENOUS
  Filled 2021-08-29: qty 10

## 2021-08-29 MED ORDER — ENOXAPARIN SODIUM 40 MG/0.4ML IJ SOSY
40.0000 mg | PREFILLED_SYRINGE | INTRAMUSCULAR | Status: DC
  Administered 2021-08-29 – 2021-09-01 (×4): 40 mg via SUBCUTANEOUS
  Filled 2021-08-29 (×4): qty 0.4

## 2021-08-29 MED ORDER — METOPROLOL TARTRATE 5 MG/5ML IV SOLN
5.0000 mg | Freq: Once | INTRAVENOUS | Status: AC
  Administered 2021-08-29: 5 mg via INTRAVENOUS
  Filled 2021-08-29: qty 5

## 2021-08-29 MED ORDER — VITAMIN B-12 1000 MCG PO TABS
1000.0000 ug | ORAL_TABLET | ORAL | Status: DC
  Administered 2021-08-30 – 2021-09-01 (×2): 1000 ug via ORAL
  Filled 2021-08-29 (×3): qty 1

## 2021-08-29 MED ORDER — MELATONIN 5 MG PO TABS
5.0000 mg | ORAL_TABLET | Freq: Every evening | ORAL | Status: DC | PRN
  Administered 2021-08-29 – 2021-09-01 (×4): 5 mg via ORAL
  Filled 2021-08-29 (×4): qty 1

## 2021-08-29 MED ORDER — FOLIC ACID 1 MG PO TABS
1000.0000 ug | ORAL_TABLET | ORAL | Status: DC
  Administered 2021-08-30 – 2021-09-01 (×2): 1 mg via ORAL
  Filled 2021-08-29 (×2): qty 1

## 2021-08-29 MED ORDER — IPRATROPIUM-ALBUTEROL 0.5-2.5 (3) MG/3ML IN SOLN
3.0000 mL | RESPIRATORY_TRACT | Status: DC | PRN
  Administered 2021-08-30: 3 mL via RESPIRATORY_TRACT

## 2021-08-29 MED ORDER — IPRATROPIUM-ALBUTEROL 0.5-2.5 (3) MG/3ML IN SOLN
3.0000 mL | Freq: Four times a day (QID) | RESPIRATORY_TRACT | Status: DC
  Administered 2021-08-29 – 2021-08-30 (×2): 3 mL via RESPIRATORY_TRACT
  Filled 2021-08-29 (×2): qty 3

## 2021-08-29 MED ORDER — SODIUM CHLORIDE 0.9 % IV SOLN
500.0000 mg | Freq: Once | INTRAVENOUS | Status: AC
  Administered 2021-08-29: 500 mg via INTRAVENOUS
  Filled 2021-08-29: qty 5

## 2021-08-29 MED ORDER — IOHEXOL 350 MG/ML SOLN
75.0000 mL | Freq: Once | INTRAVENOUS | Status: AC | PRN
  Administered 2021-08-29: 75 mL via INTRAVENOUS
  Filled 2021-08-29: qty 75

## 2021-08-29 MED FILL — Ibrutinib Tab 420 MG: ORAL | 28 days supply | Qty: 28 | Fill #0 | Status: AC

## 2021-08-29 NOTE — ED Notes (Signed)
Equipment brought to bedside to complete blood-work. Pt away at imaging. Will collect once pt back to room.

## 2021-08-29 NOTE — Progress Notes (Signed)
°   08/29/21 2156  Assess: MEWS Score  Temp 98 F (36.7 C)  BP (!) 138/95  Pulse Rate (!) 121  Resp 20  SpO2 94 %  O2 Device Nasal Cannula  O2 Flow Rate (L/min) 4 L/min  Assess: MEWS Score  MEWS Temp 0  MEWS Systolic 0  MEWS Pulse 2  MEWS RR 0  MEWS LOC 0  MEWS Score 2  MEWS Score Color Yellow  Assess: if the MEWS score is Yellow or Red  Were vital signs taken at a resting state? Yes  Focused Assessment No change from prior assessment  Does the patient meet 2 or more of the SIRS criteria? No  MEWS guidelines implemented *See Row Information* Yes  Treat  MEWS Interventions Administered prn meds/treatments  Pain Scale 0-10  Pain Score 0  Take Vital Signs  Increase Vital Sign Frequency  Yellow: Q 2hr X 2 then Q 4hr X 2, if remains yellow, continue Q 4hrs  Escalate  MEWS: Escalate Yellow: discuss with charge nurse/RN and consider discussing with provider and RRT  Notify: Charge Nurse/RN  Name of Charge Nurse/RN Notified Earlie Server RN  Date Charge Nurse/RN Notified 08/29/21  Time Charge Nurse/RN Notified 2209  Document  Patient Outcome Other (Comment) (Continue to monitor)  Progress note created (see row info) Yes  Assess: SIRS CRITERIA  SIRS Temperature  0  SIRS Pulse 1  SIRS Respirations  0  SIRS WBC 0  SIRS Score Sum  1

## 2021-08-29 NOTE — ED Provider Notes (Signed)
[facility] Provider Note    Event Date/Time   First MD Initiated Contact with Patient 08/29/21 1237     (approximate)   History   Shortness of Breath   HPI  Maureen [name] is a 81 y.o. 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  Patient has not been coughing up anything.  She denies any associated fevers or chest pain, headache, nausea, vomiting, diarrhea, urinary symptoms or abdominal pain.  She states that she has had some soreness in her lower back over the last 2 or 3 days as well but does not member any specific injuries or falls.  No extremity weakness numbness or tingling.  No other acute concerns at this time.  She denies any known cardiac or other pulmonary history.  She is not on a blood thinners.  Patient does not normally wear oxygen but was placed on 2 L nasal cannula at clinic she was noted to have a room air SPO2 of 88%.  This improved 94% on 4 L.       Physical Exam  Triage Vital Signs: ED Triage Vitals  Enc Vitals Group     BP 08/29/21 1247 (!) 129/91     Pulse Rate 08/29/21 1247 (!) 104     Resp 08/29/21 1247 20     Temp 08/29/21 1247 97.8 F (36.6 C)     Temp Source 08/29/21 1247 Oral     SpO2 08/29/21 1247 100 %     Weight 08/29/21 1250 124 lb (56.2 kg)     Height 08/29/21 1250 5\' 5"  (1.651 m)     Head Circumference --      Peak Flow --      Pain Score --      Pain Loc --      Pain Edu? --      Excl. in [place]? --     Most recent vital signs: Vitals:   08/29/21 1402 08/29/21 1405  BP:    Pulse:  (!) 109  Resp:    Temp:    SpO2: 94%     General: Awake, chronically ill-appearing CV:  No significant murmurs.  Slightly irregular.  2+ radial  pulse. Resp:  Normal effort.  Clear at the apices with some slight rhonchi at the bases. Abd:  No distention.  Soft throughout. Other:  Normal bilateral lower extremity edema.  Patient has symmetric strength in lower extremities.  Sensation is intact in the bilateral lower extremities   ED Results / Procedures / Treatments  Labs (all labs ordered are listed, but only abnormal results are displayed) Labs Reviewed  CBC WITH DIFFERENTIAL/PLATELET - Abnormal; Notable for the following components:      Result Value   WBC 36.3 (*)    Hemoglobin 11.0 (*)    MCV 79.6 (*)    MCH 23.4 (*)    MCHC 29.3 (*)    RDW 17.5 (*)    All other components within normal limits  RESP PANEL BY RT-PCR (FLU A&B, COVID) ARPGX2  CULTURE, BLOOD (ROUTINE X 2)  CULTURE, BLOOD (ROUTINE X 2)  LACTIC ACID, PLASMA  PROTIME-INR  APTT  COMPREHENSIVE METABOLIC PANEL  MAGNESIUM  LACTIC ACID, PLASMA  URINALYSIS, COMPLETE (  UACMP) WITH MICROSCOPIC  PROCALCITONIN  TROPONIN I (HIGH SENSITIVITY)  TROPONIN I (HIGH SENSITIVITY)     EKG   A-fib with a ventricular rate of 105, normal axis, unremarkable intervals with incomplete right bundle branch block and some nonspecific ST changes in inferior lateral leads without other clear evidence of acute ischemia   RADIOLOGY   Plain film of L-spine interpreted by myself shows some scoliosis mild versus listhesis of L2 and L3 without any other acute process such as fracture.  Also reviewed radiologist interpretation and agree with the findings.   PROCEDURES:  Critical Care performed: Yes, see critical care procedure note(s)  .Critical Care Performed by: Lucrezia Starch, MD Authorized by: Lucrezia Starch, MD   Critical care provider statement:    Critical care time (minutes):  30   Critical care was necessary to treat or prevent imminent or life-threatening deterioration of the following conditions:  Respiratory failure and sepsis   Critical care was time spent  personally by me on the following activities:  Development of treatment plan with patient or surrogate, discussions with consultants, evaluation of patient's response to treatment, examination of patient, ordering and review of laboratory studies, ordering and review of radiographic studies, ordering and performing treatments and interventions, pulse oximetry, re-evaluation of patient's condition and review of old charts    MEDICATIONS ORDERED IN ED: Medications  ipratropium-albuterol (DUONEB) 0.5-2.5 (3) MG/3ML nebulizer solution 9 mL (has no administration in time range)  lactated ringers bolus 1,000 mL (has no administration in time range)     IMPRESSION / MDM / [place] / ED COURSE  I reviewed the triage vital signs and the nursing notes.                              Differential diagnosis includes, but is not limited to respiratory failure secondary to pneumonia causing sepsis, viral bronchitis, ACS, PE, arrhythmia, anemia, metabolic derangements.   Differential considerations for her low back pain include possible degenerative changes versus pathologic fracture given her history of cancer.  No overlying skin changes of cellulitis and she has no focal deficits to suggest cord compression syndrome.  Plain film of L-spine interpreted by myself shows some scoliosis mild versus listhesis of L2 and L3 without any other acute process such as fracture.  Also reviewed radiologist interpretation and agree with the findings.  ECG is remarkable for A-fib with a ventricular rate of 105, normal axis, unremarkable intervals with incomplete right bundle branch block and some nonspecific ST changes in inferior lateral leads without other clear evidence of acute ischemia  COVID and influenza PCR is negative.  CBC shows chronic leukocytosis with WBC count today at 36.3 compared to 23.27-month ago.  Hemoglobin stable at 11 compared to 11.13-month ago.  Platelets are normal at 231.  Lactic acid  1.3.  Coagulation studies unremarkable.  I will obtain a CTA chest to rule out PE and assess for evidence of pneumonia or other acute thoracic process.  We will get her wheezing given extensive tobacco abuse history think it is reasonable to try DuoNeb treatment as well to see if this helps with her shortness of breath.  Will hydrate with some IV fluids given low suspicion for CHF at this time.  Given respiratory failure with tachycardia and concern for respiratory source of infection and concern for possible sepsis.  Blood cultures obtained.  Will initiate antibiotics pending CTA if there is evidence of a  pneumonia.  Patient will require admission to hospitalist service.  I discussed patient with attending father at approximately 3:30 PM.  Care of patient assumed at that time.  Plan is to follow-up CTA remaining labs including CMP, troponin, magnesium and reassess and admit to medicine.   FINAL CLINICAL IMPRESSION(S) / ED DIAGNOSES   Final diagnoses:  Acute respiratory failure with hypoxia (HCC)  Acute midline low back pain without sciatica  Acute cough  SOB (shortness of breath)  SIRS (systemic inflammatory response syndrome) ([place])     Rx / DC Orders   ED Discharge Orders     None        Note:  This document was prepared using Dragon voice recognition software and may include unintentional dictation errors.   Lucrezia Starch, MD 08/29/21 [phone]

## 2021-08-29 NOTE — Progress Notes (Signed)
[place] NP made aware that pt HR still running afib 126-130s, new order for IV metoprolol x1

## 2021-08-29 NOTE — Progress Notes (Signed)
Date:  08/29/2021   Name:  Maureen [name]   DOB:  1941-01-08   MRN:  [id number]   Chief Complaint: Cough (SOB, hears "crackles" when breathing, fatigue, oxygen goes down when walking x 1 week)  Cough This is a new problem. The problem has been gradually worsening. The cough is Productive of purulent sputum. Associated symptoms include shortness of breath and wheezing. Pertinent negatives include no chest pain, chills, ear pain, fever, headaches, heartburn, hemoptysis, myalgias, rash, rhinorrhea or sore throat. There is no history of environmental allergies.  Shortness of Breath This is a new problem. The problem occurs constantly. The problem has been gradually worsening. Associated symptoms include sputum production and wheezing. Pertinent negatives include no abdominal pain, chest pain, ear pain, fever, headaches, hemoptysis, leg swelling, neck pain, rash, rhinorrhea or sore throat. The symptoms are aggravated by URIs and weather changes. Risk factors include smoking. She has tried nothing for the symptoms.   Lab Results  Component Value Date   NA 134 (L) 07/12/2021   K 3.6 07/12/2021   CO2 26 07/12/2021   GLUCOSE 94 07/12/2021   BUN 15 07/12/2021   CREATININE 0.71 07/12/2021   CALCIUM 8.4 (L) 07/12/2021   GFRNONAA >60 07/12/2021   No results found for: CHOL, HDL, LDLCALC, LDLDIRECT, TRIG, CHOLHDL Lab Results  Component Value Date   TSH 1.713 03/25/2019   No results found for: HGBA1C Lab Results  Component Value Date   WBC 23.5 (H) 07/12/2021   HGB 11.8 (L) 07/12/2021   HCT 37.8 07/12/2021   MCV 80.4 07/12/2021   PLT 161 07/12/2021   Lab Results  Component Value Date   ALT 9 07/12/2021   AST 16 07/12/2021   ALKPHOS 50 07/12/2021   BILITOT 0.5 07/12/2021   No results found for: 25OHVITD2, 25OHVITD3, VD25OH   Review of Systems  Constitutional:  Negative for chills and fever.  HENT:  Negative for drooling, ear discharge, ear pain, rhinorrhea and sore throat.    Respiratory:  Positive for cough, sputum production, shortness of breath and wheezing. Negative for hemoptysis.   Cardiovascular:  Negative for chest pain, palpitations and leg swelling.  Gastrointestinal:  Negative for abdominal pain, blood in stool, constipation, diarrhea, heartburn and nausea.  Endocrine: Negative for polydipsia.  Genitourinary:  Negative for dysuria, frequency, hematuria and urgency.  Musculoskeletal:  Negative for back pain, myalgias and neck pain.  Skin:  Positive for color change. Negative for rash.  Allergic/Immunologic: Negative for environmental allergies.  Neurological:  Negative for dizziness and headaches.  Hematological:  Does not bruise/bleed easily.  Psychiatric/Behavioral:  Negative for suicidal ideas. The patient is not nervous/anxious.    Patient Active Problem List   Diagnosis Date Noted   Osteoporosis 06/13/2021   Paronychia of third toe of right foot 02/24/2020   Injury of right toe, initial encounter 02/24/2020   Thrombocytopenia ([place]) 10/20/2019   Hyperkalemia 03/24/2019   Anemia 03/24/2019   Goals of care, counseling/discussion 03/24/2019   Skin lesions 02/01/2019   Abnormal iron saturation 10/22/2018   B12 deficiency 09/24/2018   Renal mass, right 10/22/2017   Chemotherapy-induced neutropenia ([place]) 10/14/2017   CLL (chronic lymphocytic leukemia) ([place]) 02/28/2016    No Known Allergies  Past Surgical History:  Procedure Laterality Date   breeast mass removed  1988   benign    Social History   Tobacco Use   Smoking status: Former    Packs/day: 0.25    Years: 56.00    Pack years: 14.00  Types: Cigarettes    Quit date: 07/16/2021    Years since quitting: 0.1   Smokeless tobacco: Never  Vaping Use   Vaping Use: Never used  Substance Use Topics   Alcohol use: Yes    Alcohol/week: 1.0 standard drink    Types: 1 Glasses of wine per week    Comment: occassional   Drug use: No     Medication list has been reviewed and  updated.  Current Meds  Medication Sig   alendronate (FOSAMAX) 70 MG tablet Take 1 tablet (70 mg total) by mouth every 7 (seven) days. Take with a full glass of water on an empty stomach.   amLODipine (NORVASC) 2.5 MG tablet Take 1 tablet (2.5 mg total) by mouth daily.   Calcium Carbonate-Vit D-Min (CALCIUM 1200) 1200-1000 MG-UNIT CHEW Chew 1 tablet by mouth daily.   Cholecalciferol (VITAMIN D3) 20 MCG (800 UNIT) TABS Take 1 tablet by mouth daily.   folic acid (FOLVITE) 098 MCG tablet Take 800 mcg by mouth 3 (three) times a week.    ibrutinib (IMBRUVICA) 420 MG tablet Take 1 tablet by mouth daily.   mupirocin cream (BACTROBAN) 2 % Apply 1 application topically 2 (two) times daily.   vitamin B-12 (CYANOCOBALAMIN) 1000 MCG tablet Take 1,000 mcg by mouth. Takes three times a week    PHQ 2/9 Scores 05/12/2021 11/09/2020 10/26/2020 11/04/2019  PHQ - 2 Score 0 0 0 0  PHQ- 9 Score 0 - 0 -    GAD 7 : Generalized Anxiety Score 05/12/2021 10/26/2020  Nervous, Anxious, on Edge 0 0  Control/stop worrying 0 0  Worry too much - different things 0 0  Trouble relaxing 0 0  Restless 0 0  Easily annoyed or irritable 0 0  Afraid - awful might happen 0 0  Total GAD 7 Score 0 0    BP Readings from Last 3 Encounters:  08/29/21 (!) 142/86  07/12/21 117/69  05/12/21 120/80    Physical Exam Vitals and nursing note reviewed.  Constitutional:      Appearance: She is well-developed.  HENT:     Head: Normocephalic.     Right Ear: Tympanic membrane, ear canal and external ear normal.     Left Ear: Tympanic membrane, ear canal and external ear normal.     Nose: Nose normal. No congestion or rhinorrhea.  Eyes:     General: Lids are everted, no foreign bodies appreciated. No scleral icterus.       Left eye: No foreign body or hordeolum.     Conjunctiva/sclera: Conjunctivae normal.     Right eye: Right conjunctiva is not injected.     Left eye: Left conjunctiva is not injected.     Pupils: Pupils are  equal, round, and reactive to light.  Neck:     Thyroid: No thyromegaly.     Vascular: No JVD.     Trachea: No tracheal deviation.  Cardiovascular:     Rate and Rhythm: Normal rate and regular rhythm.     Heart sounds: Normal heart sounds. No murmur heard.   No friction rub. No gallop.  Pulmonary:     Effort: Pulmonary effort is normal. No respiratory distress.     Breath sounds: Examination of the right-lower field reveals decreased breath sounds. Decreased breath sounds present. No wheezing, rhonchi or rales.  Abdominal:     General: Bowel sounds are normal.     Palpations: Abdomen is soft. There is no mass.     Tenderness: There  is no abdominal tenderness. There is no guarding or rebound.  Musculoskeletal:        General: No tenderness. Normal range of motion.     Cervical back: Normal range of motion and neck supple.  Lymphadenopathy:     Cervical: No cervical adenopathy.  Skin:    General: Skin is warm.     Findings: No rash.  Neurological:     Mental Status: She is alert and oriented to person, place, and time.     Cranial Nerves: No cranial nerve deficit.     Deep Tendon Reflexes: Reflexes normal.  Psychiatric:        Mood and Affect: Mood is not anxious or depressed.    Wt Readings from Last 3 Encounters:  08/29/21 108 lb (49 kg)  07/12/21 117 lb 14.4 oz (53.5 kg)  05/12/21 123 lb (55.8 kg)    BP (!) 142/86    Pulse (!) 124    Temp 97.6 F (36.4 C) (Axillary)    Ht 5\' 7"  (1.702 m)    Wt 108 lb (49 kg)    SpO2 (!) 88%    BMI 16.92 kg/m   Assessment and Plan:  1. Acute respiratory distress New onset.  Persistent.  Gradually worsening.  Patient over the past several days has gradually developed increasing shortness of breath particularly with exertion.  Today comes with near exhaustion with walking to the clinic, cyanotic appearance with pulse ox decreasing to the mid 80s with activity.  2. CLL (chronic lymphocytic leukemia) ([place]) Patient is on chemotherapy for  CLL presumably with some immunosuppression and we will further evaluate by sending to the emergency for further evaluation and treatment.  3. Tachycardia Patient is also been having tachycardia with activity likely due to myocardial demand.  This is further putting a strain on the patient particularly with decreased oxygen.  4. Centrilobular emphysema (HCC) Chronic.  Persistent.  Stable.  This is baseline centrilobular emphysema of a moderate degree.  The acute respiratory concern presumably of a possible pneumonia on top of her underlying emphysema makes her at risk for significant event or gradual deterioration.

## 2021-08-29 NOTE — ED Notes (Signed)
500cc NS bolus from EMS completed.

## 2021-08-29 NOTE — H&P (Addendum)
H&P:    Maureen [name]   JJH:[id number] DOB: 02/10/1941 DOA: 08/29/2021  PCP: Juline Patch, MD  Chief Complaint: Shortness of breath, productive cough   History of Present Illness:    HPI: Maureen [name] is a 81 y.o. 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  Presents via EMS from [place] for shortness of breath and productive cough for the past 4 days.  No fevers or chills.  No urinary complaints.  Not encephalopathic.  Not on oxygen at baseline.  She was 88% on room air.  Now on 3 L and saturating well.  Complained of some back pain. Follows with local oncology Dr. Janese Banks.  No abdominal pain.  No nausea vomiting or diarrhea.  No chest pain.  ED Course: L-spine x-ray was unremarkable.  She was noted to be in A-fib with heart rate of 105.  She has a chronic leukocytosis and WBC is 36 in the emergency department.  Urinalysis positive for UTI.  CT PE study showed no pulmonary embolism but did show a large right hilar mass and large left pleural effusion.  Findings also consistent with a postobstructive pneumonia.  She has been treated with antibiotics including ceftriaxone and Zithromax in the emergency department.   ROS:   14 point review of systems is negative except for what is mentioned above in the HPI.   Past Medical History:   Past Medical History:  Diagnosis Date   Anemia    CLL (chronic lymphocytic leukemia) ([place]) 2002   CLL (chronic lymphocytic leukemia) ([facility])    Itching    Vitamin B 12 deficiency     Past Surgical History:   Past Surgical History:  Procedure Laterality Date   breeast mass removed  1988   benign    Social History:   Social History   Socioeconomic History   Marital status: Divorced    Spouse name: Not on file   Number of children: 2   Years of education: Not on file   Highest education level: 9th grade  Occupational History   Occupation: Retired  Tobacco Use    Smoking status: Former    Packs/day: 0.25    Years: 56.00    Pack years: 14.00    Types: Cigarettes    Quit date: 07/16/2021    Years since quitting: 0.1   Smokeless tobacco: Never  Vaping Use   Vaping Use: Never used  Substance and Sexual Activity   Alcohol use: Yes    Alcohol/week: 1.0 standard drink    Types: 1 Glasses of wine per week    Comment: occassional   Drug use: No   Sexual activity: Not Currently  Other Topics Concern   Not on file  Social History Narrative   Pt lives alone.    Social Determinants of Health   Financial Resource Strain: High Risk   Difficulty of Paying Living Expenses: Hard  Food Insecurity: No Food Insecurity   Worried About Charity fundraiser in the Last Year: Never true   Ran Out of Food in the Last Year: Never true  Transportation Needs: No Transportation Needs   Lack of Transportation (Medical): No   Lack of Transportation (Non-Medical): No  Physical Activity: Inactive   Days of Exercise per Week: 0 days   Minutes of Exercise per Session: 0 min  Stress: No Stress Concern Present   Feeling of Stress : Only a little  Social Connections:  Socially Isolated   Frequency of Communication with Friends and Family: More than three times a week   Frequency of Social Gatherings with Friends and Family: Once a week   Attends Religious Services: Never   Marine scientist or Organizations: No   Attends Music therapist: Never   Marital Status: Divorced  Human resources officer Violence: Not At Risk   Fear of Current or Ex-Partner: No   Emotionally Abused: No   Physically Abused: No   Sexually Abused: No    Allergies:   No Known Allergies  Family History:   Family History  Family history unknown: Yes     Current Medications:   Prior to Admission medications   Medication Sig Start Date End Date Taking? Authorizing Provider  alendronate (FOSAMAX) 70 MG tablet Take 1 tablet (70 mg total) by mouth every 7 (seven) days. Take  with a full glass of water on an empty stomach. 06/13/21   Juline Patch, MD  amLODipine (NORVASC) 2.5 MG tablet Take 1 tablet (2.5 mg total) by mouth daily. 05/12/21   Juline Patch, MD  Calcium Carbonate-Vit D-Min (CALCIUM 1200) 1200-1000 MG-UNIT CHEW Chew 1 tablet by mouth daily. 06/13/21   Juline Patch, MD  Cholecalciferol (VITAMIN D3) 20 MCG (800 UNIT) TABS Take 1 tablet by mouth daily. 06/13/21   Juline Patch, MD  folic acid (FOLVITE) 998 MCG tablet Take 800 mcg by mouth 3 (three) times a week.     [provider]  ibrutinib (IMBRUVICA) 420 MG tablet Take 1 tablet by mouth daily. 08/29/21 08/29/22  Sindy Guadeloupe, MD  mupirocin cream (BACTROBAN) 2 % Apply 1 application topically 2 (two) times daily. 3/38/25   Defelice, Jeanett Schlein, NP  vitamin B-12 (CYANOCOBALAMIN) 1000 MCG tablet Take 1,000 mcg by mouth. Takes three times a week    [provider]     Physical Exam:   Vitals:   08/29/21 1402 08/29/21 1405 08/29/21 1701 08/29/21 1752  BP:   128/74   Pulse:  (!) 109 (!) 124   Resp:   (!) 21   Temp:   98.2 F (36.8 C)   TempSrc:   Oral   SpO2: 94%  96% 91%  Weight:      Height:         General: Appears older than stated age, thin frail appearing Cardiovascular:  RRR, no m/r/g.  Respiratory:   Diminished breath sounds in the left lower lobe Abdomen:  soft, NT, ND, NABS Skin:  no rash or induration seen on limited exam Musculoskeletal:  grossly normal tone BUE/BLE, good ROM, no bony abnormality Lower extremity:  No LE edema.  Limited foot exam with no ulcerations.  2+ distal pulses. Psychiatric:  grossly normal mood and affect, speech fluent and appropriate, AOx3 Neurologic:  CN 2-12 grossly intact, moves all extremities in coordinated fashion, sensation intact    Data Review:    Radiological Exams on Admission: Independently reviewed - see discussion in A/P where applicable  DG Lumbar Spine Complete  Result Date: 08/29/2021 CLINICAL DATA:  Back  pain. EXAM: LUMBAR SPINE - COMPLETE 4+ VIEW COMPARISON:  None. FINDINGS: Mild lumbar scoliosis. Mild retrolisthesis L2-3. Otherwise normal alignment. Negative for fracture or mass. Atherosclerotic calcification abdominal aorta. Disc degeneration throughout the lumbar spine with disc space narrowing and spurring IMPRESSION: Multilevel lumbar spondylosis with scoliosis.  No acute abnormality. Electronically Signed   By: Franchot Gallo M.D.   On: 08/29/2021 13:46   CT Angio Chest  PE W and/or Wo Contrast  Result Date: 08/29/2021 CLINICAL DATA:  Productive cough, shortness of breath EXAM: CT ANGIOGRAPHY CHEST WITH CONTRAST TECHNIQUE: Multidetector CT imaging of the chest was performed using the standard protocol during bolus administration of intravenous contrast. Multiplanar CT image reconstructions and MIPs were obtained to evaluate the vascular anatomy. RADIATION DOSE REDUCTION: This exam was performed according to the departmental dose-optimization program which includes automated exposure control, adjustment of the mA and/or kV according to patient size and/or use of iterative reconstruction technique. CONTRAST:  16mL OMNIPAQUE IOHEXOL 350 MG/ML SOLN COMPARISON:  09/01/2008 FINDINGS: Cardiovascular: This is a technically adequate evaluation of the pulmonary vasculature. No filling defects or pulmonary emboli. The heart is unremarkable without pericardial effusion. Normal caliber of the thoracic aorta. Mild atherosclerosis of the aortic arch and coronary vasculature. Mediastinum/Nodes: Large soft tissue mass at the right hilum measures up to 3.7 x 3.6 cm, with near complete obstruction of the bronchus intermedius. Mediastinal lymphadenopathy is also identified, measuring 1.8 cm in the precarinal station and up to 3 cm in the subcarinal region. Thyroid, trachea, and esophagus are otherwise unremarkable. Lungs/Pleura: Large right pleural effusion. The right hilar mass described above results in near complete  obstruction of the bronchus intermedius as well as the right middle and right lower lobe bronchi, with dense right middle and right lower lobe consolidation consistent with postobstructive change. Background emphysema. No pneumothorax. The left chest is clear. Upper Abdomen: No acute abnormality. Musculoskeletal: No acute or destructive bony lesions. Reconstructed images demonstrate no additional findings. Review of the MIP images confirms the above findings. IMPRESSION: 1. Large right hilar mass, with near complete obstruction of the bronchus intermedius, right middle lobe bronchus, and right lower lobe bronchus, consistent with malignancy. 2. Mediastinal lymphadenopathy consistent with nodal metastases. 3. Large right pleural effusion. 4. Dense consolidation within the right middle and right lower lobes with associated volume loss. Findings are compatible with postobstructive change. 5. No evidence of pulmonary embolus. 6.  Aortic Atherosclerosis (ICD10-I70.0). Electronically Signed   By: Randa Ngo M.D.   On: 08/29/2021 16:21   DG Chest Portable 1 View  Result Date: 08/29/2021 CLINICAL DATA:  Chest pain short of breath EXAM: PORTABLE CHEST 1 VIEW COMPARISON:  05/07/2007.  Chest CT 09/26/2021 FINDINGS: Large area of increased density in the right base. This is due to a combination of pleural effusion and airspace disease. Left lung is clear. Heart seems mild scratch the heart size mildly enlarged. Vascularity normal. IMPRESSION: Extensive airspace density in the right lung base most consistent with effusion and airspace consolidation. Suspect underlying right lower lobe mass lesion as identified on CT chest earlier today. Electronically Signed   By: Franchot Gallo M.D.   On: 08/29/2021 17:07    EKG: Independently reviewed.  A-fib with a rate 105   Labs on Admission: I have personally reviewed the available labs and imaging studies at the time of the admission.  Pertinent labs on Admission: WBC 36,  hemoglobin 11, bicarb 19, blood glucose 105, albumin 2.3.  Procalcitonin negative.  Urinalysis positive     Assessment/Plan:    Acute hypoxic respiratory failure: Secondary to postobstructive pneumonia and large hilar mass and large left pleural effusion.  Continue antibiotics with ceftriaxone and Zithromax.  Was 88% on room air.  Not on oxygen at baseline.  Currently on 3 L and saturating well.  CT PE study showed no pulmonary embolism.  Oncology consulted.  Dr. Janese Banks recommends obtaining an LDH and also consulting interventional radiology  for ReSound guided thoracentesis for the large left pleural effusion.  Recommended sending pleural fluid studies for cytology and flow cytometry. Please consult pulmonology in the morning.  Start bronchodilators with DuoNeb scheduled.  Oncology also recommended obtaining a CT abdomen pelvis with contrast.  CLL can sometimes transform into high-grade B-cell lymphoma.  UTI: Continue Rocephin.  Urine culture pending results.  CLL: Continue home Imbruvica  Atrial fibrillation: New onset. Not in rapid ventricular response.  Continue monitoring on telemetry.  Not a candidate for chronic anticoagulation currently as she will be undergoing procedures.  Obtain echocardiogram to assess for valvular disease.  HTN: Continue home amlodipine    Other information:    Level of Care: Med/tele DVT prophylaxis: Lovenox subcu Code Status: Full code Consults: Oncology Admission status: Inpatient   Leslee Home DO [facility]   How to contact the [facility] Attending or Consulting provider [place] or covering provider during after hours [place], for this patient?  Check the care team in [facility] and look for a) attending/consulting [facility] provider listed and b) the [facility] team listed Log into [email or web address] and use [place]'s universal password to access. If you do not have the password, please contact the hospital operator. Locate the [facility] provider you are looking for under  [facility] and page to a number that you can be directly reached. If you still have difficulty reaching the provider, please page the [facility] (Director on Call) for the Hospitalists listed on amion for assistance.   08/29/2021, 6:55 PM

## 2021-08-29 NOTE — ED Triage Notes (Signed)
Presents via EMS from [place]  states she has been SOB and has had prod cough for 4 day

## 2021-08-29 NOTE — ED Provider Notes (Signed)
Patient signed out to me pending CTA and labs.  In brief she is an 81 year old female presenting with cough and shortness of breath found to have a new 4 L oxygen requirement.  She has a leukocytosis of 34, chronically elevated due to her CLL.  CMP largely within normal limits.  UTI demonstrates a large right hilar mass with postobstructive changes.  Pro-Cal is negative, difficult to exclude a postobstructive pneumonia so will cover with ceftriaxone and azithromycin.  Patient also with significant WBCs in her urine so this will also cover potential UTI.  She was given a liter of fluid.  Not displaying signs of sepsis this is time.  Will admit to the hospital service.   Rada Hay, MD 08/29/21 1640

## 2021-08-30 ENCOUNTER — Encounter: Payer: Self-pay | Admitting: Family Medicine

## 2021-08-30 ENCOUNTER — Inpatient Hospital Stay: Payer: Medicare Other

## 2021-08-30 ENCOUNTER — Inpatient Hospital Stay
Admit: 2021-08-30 | Discharge: 2021-08-30 | Disposition: A | Discharge status: Home / Self Care | Payer: Medicare Other | Attending: Family Medicine | Admitting: Family Medicine

## 2021-08-30 DIAGNOSIS — R918 Other nonspecific abnormal finding of lung field: Secondary | ICD-10-CM

## 2021-08-30 DIAGNOSIS — J9601 Acute respiratory failure with hypoxia: Secondary | ICD-10-CM

## 2021-08-30 DIAGNOSIS — J90 Pleural effusion, not elsewhere classified: Secondary | ICD-10-CM

## 2021-08-30 DIAGNOSIS — R0602 Shortness of breath: Secondary | ICD-10-CM

## 2021-08-30 DIAGNOSIS — Z8679 Personal history of other diseases of the circulatory system: Secondary | ICD-10-CM

## 2021-08-30 DIAGNOSIS — M545 Low back pain, unspecified: Secondary | ICD-10-CM

## 2021-08-30 DIAGNOSIS — E440 Moderate protein-calorie malnutrition: Secondary | ICD-10-CM | POA: Insufficient documentation

## 2021-08-30 LAB — AMYLASE, PLEURAL OR PERITONEAL FLUID: Amylase, Fluid: 21 U/L

## 2021-08-30 LAB — IRON AND TIBC
Iron: 18 ug/dL — ABNORMAL LOW (ref 28–170)
Saturation Ratios: 11 % (ref 10.4–31.8)
TIBC: 169 ug/dL — ABNORMAL LOW (ref 250–450)
UIBC: 151 ug/dL

## 2021-08-30 LAB — CBC
HCT: 35.7 % — ABNORMAL LOW (ref 36.0–46.0)
Hemoglobin: 10.5 g/dL — ABNORMAL LOW (ref 12.0–15.0)
MCH: 23.1 pg — ABNORMAL LOW (ref 26.0–34.0)
MCHC: 29.4 g/dL — ABNORMAL LOW (ref 30.0–36.0)
MCV: 78.6 fL — ABNORMAL LOW (ref 80.0–100.0)
Platelets: 195 10*3/uL (ref 150–400)
RBC: 4.54 MIL/uL (ref 3.87–5.11)
RDW: 17.2 % — ABNORMAL HIGH (ref 11.5–15.5)
WBC: 24.3 10*3/uL — ABNORMAL HIGH (ref 4.0–10.5)
nRBC: 0 % (ref 0.0–0.2)

## 2021-08-30 LAB — BODY FLUID CELL COUNT WITH DIFFERENTIAL
Eos, Fluid: 0 %
Lymphs, Fluid: 61 %
Monocyte-Macrophage-Serous Fluid: 32 %
Neutrophil Count, Fluid: 7 %
Total Nucleated Cell Count, Fluid: 775 cu mm

## 2021-08-30 LAB — BASIC METABOLIC PANEL
Anion gap: 10 (ref 5–15)
BUN: 14 mg/dL (ref 8–23)
CO2: 22 mmol/L (ref 22–32)
Calcium: 8 mg/dL — ABNORMAL LOW (ref 8.9–10.3)
Chloride: 104 mmol/L (ref 98–111)
Creatinine, Ser: 0.45 mg/dL (ref 0.44–1.00)
GFR, Estimated: >60 mL/min (ref >60)
Glucose, Bld: 103 mg/dL — ABNORMAL HIGH (ref 70–99)
Potassium: 3.3 mmol/L — ABNORMAL LOW (ref 3.5–5.1)
Sodium: 136 mmol/L (ref 135–145)

## 2021-08-30 LAB — FERRITIN: Ferritin: 197 ng/mL (ref 11–307)

## 2021-08-30 LAB — GLUCOSE, PLEURAL OR PERITONEAL FLUID: Glucose, Fluid: 104 mg/dL

## 2021-08-30 LAB — LACTATE DEHYDROGENASE, PLEURAL OR PERITONEAL FLUID: LD, Fluid: 608 U/L — ABNORMAL HIGH (ref 3–23)

## 2021-08-30 LAB — ALBUMIN, PLEURAL OR PERITONEAL FLUID: Albumin, Fluid: 2 g/dL

## 2021-08-30 MED ORDER — METOPROLOL TARTRATE 25 MG PO TABS
25.0000 mg | ORAL_TABLET | Freq: Two times a day (BID) | ORAL | Status: AC
  Administered 2021-08-30: 25 mg via ORAL
  Filled 2021-08-30: qty 1

## 2021-08-30 MED ORDER — IPRATROPIUM-ALBUTEROL 0.5-2.5 (3) MG/3ML IN SOLN
3.0000 mL | Freq: Three times a day (TID) | RESPIRATORY_TRACT | Status: DC
  Administered 2021-08-30 – 2021-08-31 (×2): 3 mL via RESPIRATORY_TRACT
  Filled 2021-08-30 (×3): qty 3

## 2021-08-30 MED ORDER — IOHEXOL 300 MG/ML  SOLN
75.0000 mL | Freq: Once | INTRAMUSCULAR | Status: AC | PRN
  Administered 2021-08-30: 75 mL via INTRAVENOUS

## 2021-08-30 MED ORDER — SODIUM CHLORIDE 0.9 % IV BOLUS
250.0000 mL | Freq: Once | INTRAVENOUS | Status: AC
  Administered 2021-08-30: 06:00:00 250 mL via INTRAVENOUS

## 2021-08-30 MED ORDER — POTASSIUM CHLORIDE CRYS ER 20 MEQ PO TBCR
40.0000 meq | EXTENDED_RELEASE_TABLET | Freq: Two times a day (BID) | ORAL | Status: AC
  Administered 2021-08-30 (×2): 40 meq via ORAL
  Filled 2021-08-30 (×2): qty 2

## 2021-08-30 MED ORDER — ENSURE ENLIVE PO LIQD
237.0000 mL | Freq: Two times a day (BID) | ORAL | Status: DC
  Administered 2021-08-30 – 2021-09-02 (×7): 237 mL via ORAL

## 2021-08-30 MED ORDER — METOPROLOL TARTRATE 25 MG PO TABS
12.5000 mg | ORAL_TABLET | Freq: Two times a day (BID) | ORAL | Status: DC
  Administered 2021-08-30: 12.5 mg via ORAL
  Filled 2021-08-30: qty 1

## 2021-08-30 MED ORDER — ADULT MULTIVITAMIN W/MINERALS CH
1.0000 | ORAL_TABLET | Freq: Every day | ORAL | Status: DC
  Administered 2021-08-30 – 2021-09-02 (×4): 1 via ORAL
  Filled 2021-08-30 (×4): qty 1

## 2021-08-30 NOTE — Assessment & Plan Note (Addendum)
#  81 year old female patient with a history of CLL; and longstanding smoking-is currently admitted to hospital for worsening shortness of breath.  CT scan showed large right hilar mass/postoperative obstructive atelectasis; mediastinal adenopathy.   # Large right-sided hilar mass; postobstructive atelectasis; mediastinal adenopathy in the context of longstanding history of smoking-is concerning for primary lung malignancy vs. Richter's transformation.  CT abdomen pelvis-suggestive of liver lesion concerning for metastases. 2/15- Thoracentesis-flow cytometry positive for atypical lymphocytes.  S/p supraclavicular node biopsy this morning pending biopsy.  #CLL-currently ibrutinib on HOLD. White count around 20-30 overall stable.  Mild anemia hemoglobin 10-?  Etiology.  Suspect anemia of chronic disease.  Patient can potentially be discharged in the hospital once deemed clinically stable.  Patient will follow up with Dr. Janese Banks as outpatient.

## 2021-08-30 NOTE — Progress Notes (Signed)
°   08/30/21 1005  Assess: MEWS Score  Temp 98 F (36.7 C)  BP 120/65  Pulse Rate (!) 123  Resp 17  SpO2 95 %  O2 Device Nasal Cannula  O2 Flow Rate (L/min) 4 L/min  Assess: MEWS Score  MEWS Temp 0  MEWS Systolic 0  MEWS Pulse 2  MEWS RR 0  MEWS LOC 0  MEWS Score 2  MEWS Score Color Yellow  Assess: if the MEWS score is Yellow or Red  Were vital signs taken at a resting state? Yes  Focused Assessment No change from prior assessment  Does the patient meet 2 or more of the SIRS criteria? No  MEWS guidelines implemented *See Row Information* Yes  Treat  MEWS Interventions Administered scheduled meds/treatments (Given oral metoprolol for elevated heart rate)  Pain Scale 0-10  Pain Score 0  Take Vital Signs  Increase Vital Sign Frequency  Yellow: Q 2hr X 2 then Q 4hr X 2, if remains yellow, continue Q 4hrs  Escalate  MEWS: Escalate Yellow: discuss with charge nurse/RN and consider discussing with provider and RRT  Notify: Charge Nurse/RN  Name of Charge Nurse/RN Notified Montey Hora, RN  Date Charge Nurse/RN Notified 08/30/21  Time Charge Nurse/RN Notified 1010  Document  Patient Outcome Other (Comment) (Continue to monitor)  Progress note created (see row info) Yes  Assess: SIRS CRITERIA  SIRS Temperature  0  SIRS Pulse 1  SIRS Respirations  0  SIRS WBC 0  SIRS Score Sum  1

## 2021-08-30 NOTE — Progress Notes (Signed)
*  PRELIMINARY RESULTS* Echocardiogram 2D Echocardiogram has been performed.  Maureen [name] 08/30/2021, 9:07 AM

## 2021-08-30 NOTE — Consult Note (Signed)
[facility] NOTE  Patient Care Team: Juline Patch, MD as PCP - General (Family Medicine) Lequita Asal, MD (Inactive) as Referring Physician (Hematology and Oncology) Ree Edman, MD (Dermatology)  CHIEF COMPLAINTS/PURPOSE OF CONSULTATION: Lung mass/CLL  HISTORY OF PRESENTING ILLNESS:  Maureen [name] 81 y.o.  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  Patient states that she quit smoking recently.  However patient noted to have worsening shortness of breath in the last 2 to [redacted] weeks along with cough.  No hemoptysis.  No headaches.  Poor appetite.  Positive for weight loss.  In the emergency room-CT scan showed large hilar mass; with postobstructive atelectasis with mediastinal adenopathy; slightly pleural effusion.  Patient had thoracentesis yesterday-Labs awaiting.  Patient CT scan this afternoon-results pending.  Review of Systems  Constitutional:  Positive for weight loss. Negative for chills, diaphoresis, fever and malaise/fatigue.  HENT:  Negative for nosebleeds and sore throat.   Eyes:  Negative for double vision.  Respiratory:  Positive for cough and shortness of breath. Negative for hemoptysis, sputum production and wheezing.   Cardiovascular:  Negative for chest pain, palpitations, orthopnea and leg swelling.  Gastrointestinal:  Negative for abdominal pain, blood in stool, constipation, diarrhea, heartburn, melena, nausea and vomiting.  Genitourinary:  Negative for dysuria, frequency and urgency.  Musculoskeletal:  Positive for back pain and joint pain.  Skin: Negative.  Negative for itching and rash.  Neurological:  Negative for dizziness, tingling, focal weakness, weakness and headaches.  Endo/Heme/Allergies:  Does not bruise/bleed easily.  Psychiatric/Behavioral:  Negative for depression. The patient is not  nervous/anxious and does not have insomnia.     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   CLL (chronic lymphocytic leukemia) (HCC)    Itching    Vitamin B 12 deficiency     SURGICAL HISTORY: Past Surgical History:  Procedure Laterality Date   breeast mass removed  1988   benign    SOCIAL HISTORY: Social History   Socioeconomic History   Marital status: Divorced    Spouse name: Not on file   Number of children: 2   Years of education: Not on file   Highest education level: 9th grade  Occupational History   Occupation: Retired  Tobacco Use   Smoking status: Former    Packs/day: 0.25    Years: 56.00    Pack years: 14.00    Types: Cigarettes    Quit date: 07/16/2021    Years since quitting: 0.1   Smokeless tobacco: Never  Vaping Use   Vaping Use: Never used  Substance and Sexual Activity   Alcohol use: Yes    Alcohol/week: 1.0 standard drink    Types: 1 Glasses of wine per week    Comment: occassional   Drug use: No   Sexual activity: Not Currently  Other Topics Concern   Not on file  Social History Narrative   Pt lives alone.    Social Determinants of Health   Financial Resource Strain: High Risk   Difficulty of Paying Living Expenses: Hard  Food Insecurity: No Food Insecurity   Worried About Charity fundraiser in the Last Year: Never true   Ran Out of Food in the Last Year: Never true  Transportation Needs: No Transportation Needs   Lack of Transportation (Medical): No  Lack of Transportation (Non-Medical): No  Physical Activity: Inactive   Days of Exercise per Week: 0 days   Minutes of Exercise per Session: 0 min  Stress: No Stress Concern Present   Feeling of Stress : Only a little  Social Connections: Socially Isolated   Frequency of Communication with Friends and Family: More than three times a week   Frequency of Social Gatherings with Friends and Family: Once a week    Attends Religious Services: Never   Marine scientist or Organizations: No   Attends Music therapist: Never   Marital Status: Divorced  Human resources officer Violence: Not At Risk   Fear of Current or Ex-Partner: No   Emotionally Abused: No   Physically Abused: No   Sexually Abused: No    FAMILY HISTORY: Family History  Family history unknown: Yes    ALLERGIES:  has No Known Allergies.  MEDICATIONS:  Current Facility-Administered Medications  Medication Dose Route Frequency Provider Last Rate Last Admin   amLODipine (NORVASC) tablet 2.5 mg  2.5 mg Oral Daily Rowe Pavy, Shayan S, DO   2.5 mg at 08/30/21 1016   azithromycin (ZITHROMAX) 500 mg in sodium chloride 0.9 % 250 mL IVPB  500 mg Intravenous Q24H Rowe Pavy, Shayan S, DO       calcium-vitamin D (OSCAL WITH D) 500-5 MG-MCG per tablet 1 tablet  1 tablet Oral Daily Imagene Sheller S, DO   1 tablet at 08/30/21 1019   cefTRIAXone (ROCEPHIN) 1 g in sodium chloride 0.9 % 100 mL IVPB  1 g Intravenous Q24H Imagene Sheller S, DO 200 mL/hr at 08/30/21 1641 1 g at 08/30/21 1641   cholecalciferol (VITAMIN D3) tablet   Oral Daily Imagene Sheller S, DO   1,000 Units at 08/30/21 1017   enoxaparin (LOVENOX) injection 40 mg  40 mg Subcutaneous Q24H Imagene Sheller S, DO   40 mg at 08/29/21 2232   feeding supplement (ENSURE ENLIVE / ENSURE PLUS) liquid 237 mL  237 mL Oral BID BM Richarda Osmond, MD   237 mL at 22/29/79 8921   folic acid (FOLVITE) tablet 1 mg  1,000 mcg Oral Once per day on Mon Wed Fri Anwar, Shayan S, DO   1 mg at 08/30/21 1025   guaiFENesin (MUCINEX) 12 hr tablet 600 mg  600 mg Oral BID Imagene Sheller S, DO   600 mg at 08/30/21 1018   iohexol (OMNIPAQUE) 300 MG/ML solution 50 mL  50 mL Intravenous Once PRN Imagene Sheller S, DO       ipratropium-albuterol (DUONEB) 0.5-2.5 (3) MG/3ML nebulizer solution 3 mL  3 mL Nebulization Q2H PRN Imagene Sheller S, DO   3 mL at 08/30/21 1326   ipratropium-albuterol (DUONEB) 0.5-2.5 (3) MG/3ML  nebulizer solution 3 mL  3 mL Nebulization TID Richarda Osmond, MD       melatonin tablet 5 mg  5 mg Oral QHS PRN Foust, Katy L, NP   5 mg at 08/29/21 2301   metoprolol tartrate (LOPRESSOR) tablet 25 mg  25 mg Oral BID Richarda Osmond, MD       multivitamin with minerals tablet 1 tablet  1 tablet Oral Daily Richarda Osmond, MD   1 tablet at 08/30/21 1641   potassium chloride SA (KLOR-CON M) CR tablet 40 mEq  40 mEq Oral BID Richarda Osmond, MD   40 mEq at 08/30/21 1018   vitamin B-12 (CYANOCOBALAMIN) tablet 1,000 mcg  1,000 mcg Oral Once per day on Mon Wed  Ludwig Clarks Imagene Sheller S, DO   1,000 mcg at 08/30/21 1018      .  PHYSICAL EXAMINATION:  Vitals:   08/30/21 1419 08/30/21 1652  BP: 121/67 112/72  Pulse: 84 (!) 123  Resp: 17 17  Temp: 98.3 F (36.8 C) 97.8 F (36.6 C)  SpO2: 93% 90%   Filed Weights   08/29/21 1250  Weight: 124 lb (56.2 kg)    Physical Exam Vitals and nursing note reviewed.  HENT:     Head: Normocephalic and atraumatic.     Mouth/Throat:     Pharynx: Oropharynx is clear.  Eyes:     Extraocular Movements: Extraocular movements intact.     Pupils: Pupils are equal, round, and reactive to light.  Cardiovascular:     Rate and Rhythm: Normal rate and regular rhythm.  Pulmonary:     Comments: Decreased breath sounds-right more than left. Abdominal:     Palpations: Abdomen is soft.  Musculoskeletal:        General: Normal range of motion.     Cervical back: Normal range of motion.  Skin:    General: Skin is warm.  Neurological:     General: No focal deficit present.     Mental Status: She is alert and oriented to person, place, and time.  Psychiatric:        Behavior: Behavior normal.        Judgment: Judgment normal.     LABORATORY DATA:  I have reviewed the data as listed Lab Results  Component Value Date   WBC 24.3 (H) 08/30/2021   HGB 10.5 (L) 08/30/2021   HCT 35.7 (L) 08/30/2021   MCV 78.6 (L) 08/30/2021   PLT 195 08/30/2021    Recent Labs    04/05/21 0921 07/12/21 0944 08/29/21 1405 08/30/21 0511  NA 138 134* 137 136  K 3.9 3.6 3.6 3.3*  CL 105 98 107 104  CO2 26 26 19* 22  GLUCOSE 93 94 105* 103*  BUN 13 15 22 14   CREATININE 0.62 0.71 0.51 0.45  CALCIUM 8.7* 8.4* 8.1* 8.0*  GFRNONAA >60 >60 >60 >60  PROT 6.2* 5.8* 5.2*  --   ALBUMIN 4.1 3.7 2.9*  --   AST 15 16 21   --   ALT 10 9 12   --   ALKPHOS 44 50 55  --   BILITOT 0.8 0.5 1.0  --     RADIOGRAPHIC STUDIES: I have personally reviewed the radiological images as listed and agreed with the findings in the report. DG Lumbar Spine Complete  Result Date: 08/29/2021 CLINICAL DATA:  Back pain. EXAM: LUMBAR SPINE - COMPLETE 4+ VIEW COMPARISON:  None. FINDINGS: Mild lumbar scoliosis. Mild retrolisthesis L2-3. Otherwise normal alignment. Negative for fracture or mass. Atherosclerotic calcification abdominal aorta. Disc degeneration throughout the lumbar spine with disc space narrowing and spurring IMPRESSION: Multilevel lumbar spondylosis with scoliosis.  No acute abnormality. Electronically Signed   By: Franchot Gallo M.D.   On: 08/29/2021 13:46   CT Angio Chest PE W and/or Wo Contrast  Result Date: 08/29/2021 CLINICAL DATA:  Productive cough, shortness of breath EXAM: CT ANGIOGRAPHY CHEST WITH CONTRAST TECHNIQUE: Multidetector CT imaging of the chest was performed using the standard protocol during bolus administration of intravenous contrast. Multiplanar CT image reconstructions and MIPs were obtained to evaluate the vascular anatomy. RADIATION DOSE REDUCTION: This exam was performed according to the departmental dose-optimization program which includes automated exposure control, adjustment of the mA and/or kV according to patient size  and/or use of iterative reconstruction technique. CONTRAST:  39mL OMNIPAQUE IOHEXOL 350 MG/ML SOLN COMPARISON:  09/01/2008 FINDINGS: Cardiovascular: This is a technically adequate evaluation of the pulmonary vasculature. No  filling defects or pulmonary emboli. The heart is unremarkable without pericardial effusion. Normal caliber of the thoracic aorta. Mild atherosclerosis of the aortic arch and coronary vasculature. Mediastinum/Nodes: Large soft tissue mass at the right hilum measures up to 3.7 x 3.6 cm, with near complete obstruction of the bronchus intermedius. Mediastinal lymphadenopathy is also identified, measuring 1.8 cm in the precarinal station and up to 3 cm in the subcarinal region. Thyroid, trachea, and esophagus are otherwise unremarkable. Lungs/Pleura: Large right pleural effusion. The right hilar mass described above results in near complete obstruction of the bronchus intermedius as well as the right middle and right lower lobe bronchi, with dense right middle and right lower lobe consolidation consistent with postobstructive change. Background emphysema. No pneumothorax. The left chest is clear. Upper Abdomen: No acute abnormality. Musculoskeletal: No acute or destructive bony lesions. Reconstructed images demonstrate no additional findings. Review of the MIP images confirms the above findings. IMPRESSION: 1. Large right hilar mass, with near complete obstruction of the bronchus intermedius, right middle lobe bronchus, and right lower lobe bronchus, consistent with malignancy. 2. Mediastinal lymphadenopathy consistent with nodal metastases. 3. Large right pleural effusion. 4. Dense consolidation within the right middle and right lower lobes with associated volume loss. Findings are compatible with postobstructive change. 5. No evidence of pulmonary embolus. 6.  Aortic Atherosclerosis (ICD10-I70.0). Electronically Signed   By: Randa Ngo M.D.   On: 08/29/2021 16:21   CT ABDOMEN PELVIS W CONTRAST  Result Date: 08/30/2021 CLINICAL DATA:  Abdominal pain. EXAM: CT ABDOMEN AND PELVIS WITH CONTRAST TECHNIQUE: Multidetector CT imaging of the abdomen and pelvis was performed using the standard protocol following bolus  administration of intravenous contrast. RADIATION DOSE REDUCTION: This exam was performed according to the departmental dose-optimization program which includes automated exposure control, adjustment of the mA and/or kV according to patient size and/or use of iterative reconstruction technique. CONTRAST:  37mL OMNIPAQUE IOHEXOL 300 MG/ML  SOLN COMPARISON:  02/13/2018 FINDINGS: Lower chest: As on the recent CT of the chest from 08/29/2018. There is right hilar and subcarinal adenopathy. Interval decrease in volume of the right pleural effusion status post thoracentesis. Extensive postobstructive changes are identified within the right middle lobe and right lower lobe. Small left pleural effusion identified. Hepatobiliary: Ill-defined low-density lesion within the inferior right hepatic lobe measures 1.8 by 1.5 cm, image 42/2. This is a new finding compared with 02/13/2018 and is suspicious for liver metastases. Gallbladder appears partially decompressed. No signs of gallbladder wall inflammation or bile duct dilatation. Pancreas: Unremarkable. No pancreatic ductal dilatation or surrounding inflammatory changes. Spleen: Borderline enlarged spleen measures 11.3 cm in length. Calcified granulomas identified throughout the splenic parenchyma. Adrenals/Urinary Tract: Normal adrenal glands. Multiple small areas of cortical scarring noted within the right kidney. Left kidney appears normal. No signs of mass or hydronephrosis. The urinary bladder is unremarkable. Excreted contrast material is noted within the bladder lumen. Stomach/Bowel: Stomach appears nondistended. The appendix is visualized and appears within normal limits, image 45/2. No bowel wall thickening, inflammation, or distension. Vascular/Lymphatic: Aortic atherosclerosis. Infrarenal abdominal aortic aneurysm measures 3.6 cm, image 47/2. This is compared with 2.9 cm previously. No abdominopelvic adenopathy. Reproductive: The uterus and adnexal structures are  unremarkable. Prominent bilateral parametrial veins identified which may reflect underlying pelvic venous congestion syndrome. Other: There is no free fluid or  fluid collections. No signs of pneumoperitoneum. Musculoskeletal: The bones appear diffusely osteopenic. Multilevel degenerative disc disease is identified throughout the visualized thoracic and lumbar spine. No acute or suspicious osseous findings. IMPRESSION: 1. No acute findings identified within the abdomen or pelvis. 2. There is a new low-density structure within the inferior right hepatic lobe worrisome for liver metastases. 3. Decrease in volume of right pleural effusion status post thoracentesis. 4. Infrarenal abdominal aortic aneurysm measuring 3.6 cm. Recommend follow-up every 2 years. Reference: J Am Coll Radiol 4481;85:631-497. Aortic aneurysm NOS (ICD10-I71.9). 5. Prominent bilateral parametrial veins which may reflect underlying pelvic venous congestion syndrome. 6. Aortic Atherosclerosis (ICD10-I70.0). Electronically Signed   By: Kerby Moors M.D.   On: 08/30/2021 12:17   DG Chest Port 1 View  Result Date: 08/30/2021 CLINICAL DATA:  Status post right thoracentesis. EXAM: PORTABLE CHEST 1 VIEW COMPARISON:  August 29, 2021. FINDINGS: Decreased right pleural effusion, now small. Also, small left pleural effusion. Improved right mid and lower lung opacities. No visible pneumothorax. Right hilar mass better characterized on recent CT chest. Similar cardiac silhouette. IMPRESSION: 1. Decreased right pleural effusion, now small. No visible pneumothorax. 2. Improved right mid and lower lung opacities. 3. Right hilar mass better characterized on recent CT chest. Electronically Signed   By: Margaretha Sheffield M.D.   On: 08/30/2021 10:13   DG Chest Portable 1 View  Result Date: 08/29/2021 CLINICAL DATA:  Chest pain short of breath EXAM: PORTABLE CHEST 1 VIEW COMPARISON:  05/07/2007.  Chest CT 09/26/2021 FINDINGS: Large area of increased density  in the right base. This is due to a combination of pleural effusion and airspace disease. Left lung is clear. Heart seems mild scratch the heart size mildly enlarged. Vascularity normal. IMPRESSION: Extensive airspace density in the right lung base most consistent with effusion and airspace consolidation. Suspect underlying right lower lobe mass lesion as identified on CT chest earlier today. Electronically Signed   By: Franchot Gallo M.D.   On: 08/29/2021 17:07   US THORACENTESIS ASP PLEURAL SPACE W/IMG GUIDE  Result Date: 08/30/2021 INDICATION: Patient with a history of leukemia and new right hilar mass presents today with a right pleural effusion. Interventional radiology asked to perform a diagnostic and therapeutic thoracentesis. EXAM: ULTRASOUND GUIDED THORACENTESIS MEDICATIONS: 1% lidocaine 10 mL COMPLICATIONS: None immediate. PROCEDURE: An ultrasound guided thoracentesis was thoroughly discussed with the patient and questions answered. The benefits, risks, alternatives and complications were also discussed. The patient understands and wishes to proceed with the procedure. Written consent was obtained. Ultrasound was performed to localize and mark an adequate pocket of fluid in the right chest. The area was then prepped and draped in the normal sterile fashion. 1% Lidocaine was used for local anesthesia. Under ultrasound guidance a 6 Fr Safe-T-Centesis catheter was introduced. Thoracentesis was performed. The catheter was removed and a dressing applied. FINDINGS: A total of approximately 1950 mL of light red fluid was removed. Samples were sent to the laboratory as requested by the clinical team. IMPRESSION: Successful ultrasound guided right thoracentesis yielding 1950 mL of pleural fluid. Read by: Soyla Dryer, NP Electronically Signed   By: Albin Felling M.D.   On: 08/30/2021 11:22    Acute respiratory failure with hypoxia (HCC) Multifactorial as described below.  Currently comfortable on 4L [place]     Mass of upper lobe of right lung #81 year old female patient with a history of CLL; and longstanding smoking-is currently admitted to hospital for worsening shortness of breath.  CT scan showed large  right hilar mass/postoperative obstructive atelectasis; mediastinal adenopathy.   #Large right-sided hilar mass; postobstructive atelectasis; mediastinal adenopathy in the context of longstanding history of smoking-is concerning for primary lung malignancy vs. Richter's transformation from underlying lymphoma is also quite possible.  Patient underwent thoracentesis-work-up pending  #CLL-currently on ibrutinib; white count around 20-30 overall stable.  Mild anemia hemoglobin 10-?  Etiology.  Suspect anemia of chronic disease  Recommendations:  # Await cytology labs from thoracentesis; await CT abdomen pelvis imaging.  # Hold ibrutinib given acute issues.  Check iron studies/ferritin.  #I had a very long discussion with the patient regarding significant concerns for malignancy based on imaging/history of smoking/CLL.-However no plan could be made until we have the histology back.  Patient eager to go home.  However recommend clinical stability prior to discharge.  I reviewed the blood work- with the patient in detail; also reviewed the imaging independently [as summarized above]; and with the patient in detail.   Thank you Dr. Ouida Sills for allowing me to participate in the care of your pleasant patient. Please do not hesitate to contact me with questions or concerns in the interim.  Discussed with Dr. Ouida Sills.  Addendum: CT abdomen/pelvis-concerning for liver lesion.  Will discuss with patient tomorrow.  Dr. Janese Banks, patient's primary oncologist to take over patient's care starting tomorrow.   All questions were answered. The patient knows to call the clinic with any problems, questions or concerns.     Cammie Sickle, MD 08/30/2021 5:29 PM

## 2021-08-30 NOTE — TOC Initial Note (Signed)
Transition of Care [facility]) - Initial/Assessment Note    Patient Details  Name: Maureen [name] MRN: [id number] Date of Birth: Jul 18, 1940  [facility] CM/SW Contact:    Pete Pelt, RN Phone Number: 08/30/2021, 3:25 PM  Clinical Narrative:   Patient lives at home alone.  She states her son's job takes him on the road and she is alone a lot.  She reports that she drives herself to appointments and is current with all of her appointments at this time.  She obtains her medications from the pharmacy and is able to take them as directed without concerns.  Patient does not currently have home health or DME.  She would welcome home health for some assistance at home.  RNCM notified care team, and Corene Cornea from [place].  TOC to follow.                Expected Discharge Plan: [place] Barriers to Discharge: Continued Medical Work up   Patient Goals and CMS Choice     Choice offered to / list presented to : NA  Expected Discharge Plan and Services Expected Discharge Plan: [place]   Discharge Planning Services: CM Consult Post Acute Care Choice: [place] arrangements for the past 2 months: Single Family Home                                      Prior Living Arrangements/Services Living arrangements for the past 2 months: Single Family Home Lives with:: Self Patient language and need for interpreter reviewed:: Yes (No translator required) Do you feel safe going back to the place where you live?: Yes      Need for Family Participation in Patient Care: Yes (Comment) Care giver support system in place?: Yes (comment)   Criminal Activity/Legal Involvement Pertinent to Current Situation/Hospitalization: No - Comment as needed  Activities of Daily Living Home Assistive Devices/Equipment: None ADL Screening (condition at time of admission) Patient's cognitive ability adequate to safely complete daily  activities?: Yes Is the patient deaf or have difficulty hearing?: No Does the patient have difficulty seeing, even when wearing glasses/contacts?: No Does the patient have difficulty concentrating, remembering, or making decisions?: No Patient able to express need for assistance with ADLs?: Yes Does the patient have difficulty dressing or bathing?: No Independently performs ADLs?: Yes (appropriate for developmental age) Does the patient have difficulty walking or climbing stairs?: No Weakness of Legs: Both Weakness of Arms/Hands: None  Permission Sought/Granted Permission sought to share information with : Case Manager Permission granted to share information with : Yes, Verbal Permission Granted              Emotional Assessment Appearance:: Appears stated age Attitude/Demeanor/Rapport: Gracious, Engaged Affect (typically observed): Pleasant, Appropriate Orientation: : Oriented to Self, Oriented to Place, Oriented to  Time, Oriented to Situation Alcohol / Substance Use: Not Applicable Psych Involvement: No (comment)  Admission diagnosis:  SOB (shortness of breath) [R06.02] SIRS (systemic inflammatory response syndrome) (HCC) [R65.10] Hilar mass [R91.8] Acute respiratory failure with hypoxia (HCC) [J96.01] Acute midline low back pain without sciatica [M54.50] Acute cough [R05.1] Patient Active Problem List   Diagnosis Date Noted   Acute respiratory failure with hypoxia ([place]) 08/30/2021   Mass of upper lobe of right lung 08/30/2021   Acute midline low back pain without sciatica    Pleural effusion  SOB (shortness of breath)    Hx of primary hypertension    Hilar mass 08/29/2021   Osteoporosis 06/13/2021   Paronychia of third toe of right foot 02/24/2020   Injury of right toe, initial encounter 02/24/2020   Thrombocytopenia ([place]) 10/20/2019   Hyperkalemia 03/24/2019   Anemia 03/24/2019   Goals of care, counseling/discussion 03/24/2019   Skin lesions 02/01/2019    Abnormal iron saturation 10/22/2018   B12 deficiency 09/24/2018   Renal mass, right 10/22/2017   Chemotherapy-induced neutropenia ([place]) 10/14/2017   CLL (chronic lymphocytic leukemia) ([place]) 02/28/2016   PCP:  Juline Patch, MD Pharmacy:   [facility] [address] [place] [address] Phone: [phone] Fax: [phone]  [place] [address] Phone: [phone] Fax: [phone]     Social Determinants of Health (SDOH) Interventions    Readmission Risk Interventions Readmission Risk Prevention Plan 08/30/2021  Transportation Screening Complete  PCP or Specialist Appt within 5-7 Days Complete  Home Care Screening Complete  Medication Review (RN CM) Complete  Some recent data might be hidden

## 2021-08-30 NOTE — Consult Note (Incomplete)
NAME:  Maureen [name], MRN:  [id number], DOB:  [birth year], LOS: 1 ADMISSION DATE:  08/29/2021, CONSULTATION DATE: 30 August 2021 REFERRING MD: Doristine Mango, MD CHIEF COMPLAINT: Shortness of breath, abnormal chest CT.  History of Present Illness:  ***  Pertinent  Medical History  ***  Significant Hospital Events: Including procedures, antibiotic start and stop dates in addition to other pertinent events   08/30/2021: RIGHT thoracentesis  Interim History / Subjective:  ***  Objective   Blood pressure 112/72, pulse (!) 123, temperature 97.8 F (36.6 C), temperature source Oral, resp. rate 17, height 5\' 5"  (1.651 m), weight 56.2 kg, SpO2 90 %.        Intake/Output Summary (Last 24 hours) at 08/30/2021 1738 Last data filed at 08/30/2021 4097 Gross per 24 hour  Intake 1625.55 ml  Output --  Net 1625.55 ml   Filed Weights   08/29/21 1250  Weight: 56.2 kg    Examination: GENERAL: Frail, chronically ill-appearing woman, laying in bed, no respiratory distress. HEAD: Normocephalic, atraumatic.  EYES: Pupils equal, round, reactive to light.  No scleral icterus.  MOUTH: Poor dentition, oral mucosa moist.  No thrush. NECK: Supple. No thyromegaly. Trachea midline. No JVD.  No adenopathy. PULMONARY: Good air entry bilaterally.  No adventitious sounds. CARDIOVASCULAR: S1 and S2.  Tachycardic with regular rate.  No rubs murmurs gallops heard. ABDOMEN: Scaphoid, nontender, normal active bowel sounds.  No hepatosplenomegaly. MUSCULOSKELETAL: No joint deformity, no clubbing, no edema.  Diminished muscle mass. NEUROLOGIC: No overt focal signs. SKIN: Intact,warm,dry. PSYCH: Mood and behavior normal.  Resolved Hospital Problem list   N/A  Assessment & Plan:  ***  Best Practice (right click and "Reselect all SmartList Selections" daily)   Diet/type: {diet type:25684} DVT prophylaxis: {anticoagulation (Optional):25687} GI prophylaxis: {DZ:32992} Lines: {Central Venous  Access:25771} Foley:  {Central Venous Access:25691} Code Status:  {Code Status:26939} Last date of multidisciplinary goals of care discussion [***]  Labs   CBC: Recent Labs  Lab 08/29/21 1405 08/30/21 0511  WBC 36.3* 24.3*  NEUTROABS 6.2  --   HGB 11.0* 10.5*  HCT 37.5 35.7*  MCV 79.6* 78.6*  PLT 231 426    Basic Metabolic Panel: Recent Labs  Lab 08/29/21 1405 08/30/21 0511  NA 137 136  K 3.6 3.3*  CL 107 104  CO2 19* 22  GLUCOSE 105* 103*  BUN 22 14  CREATININE 0.51 0.45  CALCIUM 8.1* 8.0*  MG 1.8  --    GFR: Estimated Creatinine Clearance: 48.1 mL/min (by C-G formula based on SCr of 0.45 mg/dL). Recent Labs  Lab 08/29/21 1405 08/29/21 1625 08/30/21 0511  PROCALCITON <0.10  --   --   WBC 36.3*  --  24.3*  LATICACIDVEN 1.3 1.3  --     Liver Function Tests: Recent Labs  Lab 08/29/21 1405  AST 21  ALT 12  ALKPHOS 55  BILITOT 1.0  PROT 5.2*  ALBUMIN 2.9*   No results for input(s): LIPASE, AMYLASE in the last 168 hours. No results for input(s): AMMONIA in the last 168 hours.  ABG No results found for: PHART, PCO2ART, PO2ART, HCO3, TCO2, ACIDBASEDEF, O2SAT   Coagulation Profile: Recent Labs  Lab 08/29/21 1405  INR 1.1    Cardiac Enzymes: No results for input(s): CKTOTAL, CKMB, CKMBINDEX, TROPONINI in the last 168 hours.  HbA1C: No results found for: HGBA1C  CBG: No results for input(s): GLUCAP in the last 168 hours.  Review of Systems:   A 10 point review of systems was performed  and it is as noted above otherwise negative.  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   Surgical History:   Past Surgical History:  Procedure Laterality Date   breeast mass removed  1988   benign     Social History:   reports that she quit smoking about 6 weeks ago. Her smoking use included  cigarettes. She has a 14.00 pack-year smoking history. She has never used smokeless tobacco. She reports current alcohol use of about 1.0 standard drink per week. She reports that she does not use drugs.   Family History:  Her Family history is unknown by patient.   Allergies No Known Allergies   Home Medications  Prior to Admission medications   Medication Sig Start Date End Date Taking? Authorizing Provider  amLODipine (NORVASC) 2.5 MG tablet Take 1 tablet (2.5 mg total) by mouth daily. 05/12/21  Yes Juline Patch, MD  Calcium Carbonate-Vit D-Min (CALCIUM 1200) 1200-1000 MG-UNIT CHEW Chew 1 tablet by mouth daily. 06/13/21  Yes Juline Patch, MD  Cholecalciferol (VITAMIN D3) 20 MCG (800 UNIT) TABS Take 1 tablet by mouth daily. 06/13/21  Yes Juline Patch, MD  folic acid (FOLVITE) 767 MCG tablet Take 800 mcg by mouth 3 (three) times a week.    Yes [provider]  ibrutinib (IMBRUVICA) 420 MG tablet Take 1 tablet by mouth daily. 08/29/21 08/29/22 Yes Sindy Guadeloupe, MD  vitamin B-12 (CYANOCOBALAMIN) 1000 MCG tablet Take 1,000 mcg by mouth. Takes three times a week   Yes [provider]  alendronate (FOSAMAX) 70 MG tablet Take 1 tablet (70 mg total) by mouth every 7 (seven) days. Take with a full glass of water on an empty stomach. 06/13/21   Juline Patch, MD     Level 4 consult    Loletha Grayer Derrill Kay, MD Advanced Bronchoscopy P[facility]    *This note was dictated using voice recognition software/Dragon.  Despite best efforts to proofread, errors can occur which can change the meaning. Any transcriptional errors that result from this process are unintentional and may not be fully corrected at the time of dictation.

## 2021-08-30 NOTE — Procedures (Signed)
PROCEDURE SUMMARY:  Successful US guided right thoracentesis. Yielded 1.9 L of light red fluid. Pt tolerated procedure well. No immediate complications.  Specimen sent for labs. CXR ordered; no post-procedure pneumothorax identified  EBL < 2 mL  Theresa Duty, NP 08/30/2021 10:37 AM

## 2021-08-30 NOTE — Assessment & Plan Note (Signed)
Multifactorial as described below.  Currently comfortable on 4L [place]

## 2021-08-30 NOTE — Plan of Care (Signed)
Full consult note to follow.  Patient was seen and examined today.  I have reviewed available films.  Recently had thoracentesis, await cytology from that.  I have reviewed her CT chest findings the mediastinal adenopathy appears have significant necrosis.  This indicates fast-growing lesion.  Query small cell.  Given the severity of necrosis noted on film biopsy of these lymph nodes will likely be inconclusive due to the degree of necrosis.  There also appears to be a right lower lobe mass with some chest wall involvement.  Additionally, the abdomen and pelvis CT has shown potential liver metastasis.  Recommend to await thoracentesis cytology.  The patient should have PET/CT which will help identify areas that are not necrotic and can be biopsied with better yield.  The patient is also very debilitated and is high risk for general anesthesia which would be required for endobronchial ultrasound and biopsy.  We will continue to follow along.  I will discuss the case with oncology.  Renold Don, MD Advanced Bronchoscopy P[facility]    *This note was dictated using voice recognition software/Dragon.  Despite best efforts to proofread, errors can occur which can change the meaning. Any transcriptional errors that result from this process are unintentional and may not be fully corrected at the time of dictation.

## 2021-08-30 NOTE — Hospital Course (Signed)
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  She presented to the ED via EMS from the [place] urgent care on 2/14 due to shortness of breath and productive cough that has been present for 4 days.  In the ED, she was found to have a new oxygen requirement of 3 L.  She was evaluated for PE which was negative but incidentally on CTA she was found to have a large right hilar mass with right pleural effusion consistent with malignancy.  Heme-onc was consulted to evaluate. She was started on IV antibiotics and IR was consulted for a thoracentesis with diagnostic labs. Additionally, patient was seen to be in A-fib with RVR.  She was overall hemodynamically stable and responded well to IV metoprolol.

## 2021-08-30 NOTE — Progress Notes (Addendum)
Progress Note   Patient: Maureen [name] EVO:[id number] DOB: April 09, 1941 DOA: 08/29/2021     1 DOS: the patient was seen and examined on 08/30/2021   Brief hospital course: Ms. Courteny Egler is a 81 year old female with past medical history of CLL on Imbruvica, former tobacco dependence, chronic anemia, HTN.  She presented to the ED via EMS from the [place] [facility] urgent care on 2/14 due to shortness of breath and productive cough that has been present for 4 days.  In the ED, she was found to have a new oxygen requirement of 3 L.  She was evaluated for PE which was negative but incidentally on CTA she was found to have a large right hilar mass with right pleural effusion consistent with malignancy.  Heme-onc was consulted to evaluate. She was started on IV antibiotics and IR was consulted for a thoracentesis with diagnostic labs. Additionally, patient was seen to be in A-fib with RVR.  She was overall hemodynamically stable and responded well to IV metoprolol.  Assessment and Plan: Acute hypoxic respiratory failure- Multifactorial as described below.  - Currently comfortable on 4L [place]   postobstructive pneumonia   large hilar mass   large Right pleural effusion- CTA on 2/14 showed large right hilar mass with near complete obstruction of bronchus intermedius, right middle lobe bronchus, right lower lobe bronchus, Mediastinal lymphadenopathy consistent with nodal metastases, Large right pleural effusion, Dense consolidation within the right middle and right lower lobes with associated volume loss. No evidence of pulmonary embolus. - continue CTX, azithromycin - IR consulted - US guided thoracentesis scheduled 2/15 - diagnostic labs ordered - heme/onc consulted - CT abdomen pelvis to monitor for further lesions - continued scheduled duonebs - wean oxygen as tolerated - consulting pulmonology  Simple cystitis- urinalysis positive for nitrites, RBCs, leuks and bacteria.  Patient denied urinary symptoms on  admission. CTX began for PNA so will continue. Ur Cx pending - continue CTX - follow up urinalysis as OP when resolved to ensure resolution of hematuria  New onset Afib with RVR- confirmed on ECG. HR 130s on telemetry overnight. asymptomatic and otherwise hemodynamically stable. Responded well to metoprolol overnight. Currently not a candidate for anticoagulation with planned procedures. HR low 100s this am.  - scheduled metoprolol - telemetry - f/u echo  HTN- chronic, stable - continue home amlodipine   CLL:  - heme/onc following, appreciate recs - Continue home Imbruvica  Mild hypokalemia- K+ 3.3 -Replete as needed -BMP a.m.   Subjective: Patient feels that she is doing overall well today.  She is agreeable to getting the CT abdomen pelvis today but just wanted an explanation of why it was needed.  She is under the understanding that she is getting a biopsy of her mass today.  Physical Exam: Vitals:   08/30/21 0150 08/30/21 0302 08/30/21 0544 08/30/21 0744  BP:   122/82   Pulse: (!) 110  (!) 106   Resp: 20 19 17    Temp:   97.9 F (36.6 C)   TempSrc:   Oral   SpO2:   93% 93%  Weight:      Height:       General: NAD, pleasant, able to participate in exam, frail. Cardiac: RRR, normal heart sounds, no murmurs. 2+ radial and PT pulses bilaterally Respiratory: CTAB, normal effort Extremities: no edema. WWP. Skin: warm and dry, no rashes noted on exposed skin Neuro: alert and oriented, no focal deficits Psych: Normal affect and mood  Data Reviewed: Urinalysis  Component Value Date/Time   COLORURINE AMBER (A) 08/29/2021 1405   APPEARANCEUR HAZY (A) 08/29/2021 1405   APPEARANCEUR CLOUDY 03/17/2013 0959   LABSPEC 1.024 08/29/2021 1405   LABSPEC 1.020 03/17/2013 0959   PHURINE 5.0 08/29/2021 1405   GLUCOSEU NEGATIVE 08/29/2021 1405   GLUCOSEU NEGATIVE 03/17/2013 0959   HGBUR MODERATE (A) 08/29/2021 1405   BILIRUBINUR NEGATIVE 08/29/2021 1405   BILIRUBINUR negative  10/14/2017 0907   BILIRUBINUR NEGATIVE 03/17/2013 0959   KETONESUR 5 (A) 08/29/2021 1405   PROTEINUR 100 (A) 08/29/2021 1405   UROBILINOGEN 0.2 10/14/2017 0907   NITRITE NEGATIVE 08/29/2021 1405   LEUKOCYTESUR LARGE (A) 08/29/2021 1405   LEUKOCYTESUR 2+ 03/17/2013 0959   BMP Latest Ref Rng & Units 08/30/2021 08/29/2021 07/12/2021  Glucose 70 - 99 mg/dL 103(H) 105(H) 94  BUN 8 - 23 mg/dL 14 22 15   Creatinine 0.44 - 1.00 mg/dL 0.45 0.51 0.71  Sodium 135 - 145 mmol/L 136 137 134(L)  Potassium 3.5 - 5.1 mmol/L 3.3(L) 3.6 3.6  Chloride 98 - 111 mmol/L 104 107 98  CO2 22 - 32 mmol/L 22 19(L) 26  Calcium 8.9 - 10.3 mg/dL 8.0(L) 8.1(L) 8.4(L)   CBC    Component Value Date/Time   WBC 24.3 (H) 08/30/2021 0511   RBC 4.54 08/30/2021 0511   HGB 10.5 (L) 08/30/2021 0511   HGB 14.1 10/21/2014 0914   HCT 35.7 (L) 08/30/2021 0511   HCT 43.8 10/21/2014 0914   PLT 195 08/30/2021 0511   PLT 175 08/08/2017 1052   MCV 78.6 (L) 08/30/2021 0511   MCV 93 10/21/2014 0914   MCH 23.1 (L) 08/30/2021 0511   MCHC 29.4 (L) 08/30/2021 0511   RDW 17.2 (H) 08/30/2021 0511   RDW 14.2 10/21/2014 0914   LYMPHSABS 29.2 (H) 08/29/2021 1405   LYMPHSABS 21.1 (H) 10/21/2014 0914   MONOABS 0.7 08/29/2021 1405   MONOABS 0.7 10/21/2014 0914   EOSABS 0.0 08/29/2021 1405   EOSABS 0.1 10/21/2014 0914   BASOSABS 0.1 08/29/2021 1405   BASOSABS 0.1 10/21/2014 0914   Family Communication: none  Disposition: Status is: Inpatient Remains inpatient appropriate because: Ongoing medical evaluation and treatment requiring inpatient level of care   Planned Discharge Destination:  TBD  enoxaparin (LOVENOX) injection 40 mg Start: 08/29/21 2200   Time spent: >60 minutes  Author: Richarda Osmond, MD 08/30/2021 9:17 AM  For on call review [email or web address].

## 2021-08-30 NOTE — Progress Notes (Signed)
Initial Nutrition Assessment  DOCUMENTATION CODES:   Non-severe (moderate) malnutrition in context of chronic illness  INTERVENTION:   -Ensure Enlive po BID, each supplement provides 350 kcal and 20 grams of protein.  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  GOAL:   Patient will meet greater than or equal to 90% of their needs  MONITOR:   PO intake, Supplement acceptance, Diet advancement, Labs, Weight trends, Skin, I & O's  REASON FOR ASSESSMENT:   Malnutrition Screening Tool    ASSESSMENT:   Maureen [name] is a 81 y.o. 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  Pt admitted with acute hypoxic respiratory failure secondary to postobstructive pneumonia, large hilar mass, and large pleural effusion.   2/15- s/p rt thoracentesis (1.9 L removed)  Reviewed I/O's: +1.6 L x 24 hours  Spoke with pt at bedside, who just took medications without difficulty. Pt shares decreased oral intake over the past 2-3 weeks due to feeling poorly. Pt reports that she had slowly reduced her cigarette smoking 3 months ago (1 pack per week) and has abstained from smoking over the past 2-3 weeks due to SOB and weakness. She also notes that her tastes have changed and she prefers spicy, highly seasoned foods. Pt reports that she usually consumes 1 meal and one snack per day (Lunch: meat, starch, and vegetable from a congregate meal site at a church and coffee and cake at 1600, which is a tradition in her home country of [place]). Pt shares that she is hungry and wishes for a cup of coffee, but understanding of NPO status. Plan for thoracentesis and CT of abdomen and pelvis today. Oncology has also been consulted.   Pt  endorses an 8# wt loss over the past week. Reviewed wt hx; pt has experienced a 6% wt loss over the past 9 months, which is not significant for time frame.   Discussed importance of good meal and supplement intake to promote healing. Pt amenable to supplements once diet is advanced.    Medications reviewed and include calcium with vitamin D, vitamin D3, folic acid, potassium chloride, and vitamin B-12.  Labs reviewed: K: 3.3 (on PO supplementation).    NUTRITION - FOCUSED PHYSICAL EXAM:  Flowsheet Row Most Recent Value  Orbital Region No depletion  Upper Arm Region Mild depletion  Thoracic and Lumbar Region No depletion  Buccal Region Mild depletion  Temple Region Mild depletion  Clavicle Bone Region Moderate depletion  Clavicle and Acromion Bone Region Moderate depletion  Scapular Bone Region Moderate depletion  Dorsal Hand Mild depletion  Patellar Region Moderate depletion  Anterior Thigh Region Moderate depletion  Posterior Calf Region Moderate depletion  Edema (RD Assessment) None  Hair Reviewed  Eyes Reviewed  Mouth Reviewed  Skin Reviewed  Nails Reviewed       Diet Order:   Diet Order             Diet regular Room service appropriate? Yes; Fluid consistency: Thin  Diet effective now                   EDUCATION NEEDS:   Education needs have been addressed  Skin:  Skin Assessment: Reviewed RN Assessment  Last BM:  08/28/21  Height:  Ht Readings from Last 1 Encounters:  08/29/21 5\' 5"  (1.651 m)    Weight:   Wt Readings from Last 1 Encounters:  08/29/21 56.2 kg    Ideal Body Weight:  56.8 kg  BMI:  Body mass index is 20.63 kg/m.  Estimated Nutritional Needs:   Kcal:  1700-1900  Protein:  85-100 grams  Fluid:  > 1.7 L    Loistine Chance, RD, LDN, [place] Registered Dietitian II Certified Diabetes Care and Education Specialist Please refer to [facility] for RD and/or RD on-call/weekend/after hours pager

## 2021-08-30 NOTE — Progress Notes (Signed)
[place] NP made aware that pts HR maintaining 120-130s again, new order for 246ml bolus placed

## 2021-08-31 ENCOUNTER — Encounter: Payer: Self-pay | Admitting: Family Medicine

## 2021-08-31 DIAGNOSIS — Z9889 Other specified postprocedural states: Secondary | ICD-10-CM

## 2021-08-31 DIAGNOSIS — J9601 Acute respiratory failure with hypoxia: Secondary | ICD-10-CM | POA: Diagnosis not present

## 2021-08-31 DIAGNOSIS — E440 Moderate protein-calorie malnutrition: Secondary | ICD-10-CM

## 2021-08-31 DIAGNOSIS — R6510 Systemic inflammatory response syndrome (SIRS) of non-infectious origin without acute organ dysfunction: Secondary | ICD-10-CM

## 2021-08-31 LAB — TRIGLYCERIDES, BODY FLUIDS: Triglycerides, Fluid: 41 mg/dL

## 2021-08-31 LAB — ACID FAST SMEAR (AFB, MYCOBACTERIA): Acid Fast Smear: NEGATIVE

## 2021-08-31 LAB — BASIC METABOLIC PANEL
Anion gap: 4 — ABNORMAL LOW (ref 5–15)
BUN: 17 mg/dL (ref 8–23)
CO2: 25 mmol/L (ref 22–32)
Calcium: 8.3 mg/dL — ABNORMAL LOW (ref 8.9–10.3)
Chloride: 107 mmol/L (ref 98–111)
Creatinine, Ser: 0.48 mg/dL (ref 0.44–1.00)
GFR, Estimated: >60 mL/min (ref >60)
Glucose, Bld: 101 mg/dL — ABNORMAL HIGH (ref 70–99)
Potassium: 4.4 mmol/L (ref 3.5–5.1)
Sodium: 136 mmol/L (ref 135–145)

## 2021-08-31 LAB — ECHOCARDIOGRAM COMPLETE
AR max vel: 2.26 cm2
AV Area VTI: 2.47 cm2
AV Area mean vel: 1.88 cm2
AV Mean grad: 3 mmHg
AV Peak grad: 4.5 mmHg
Ao pk vel: 1.06 m/s
Area-P 1/2: 5.06 cm2
Height: 65 in
S' Lateral: 2.6 cm
Weight: 1984 oz

## 2021-08-31 LAB — PROTEIN, BODY FLUID (OTHER): Total Protein, Body Fluid Other: 2.7 g/dL

## 2021-08-31 LAB — URINE CULTURE

## 2021-08-31 MED ORDER — IPRATROPIUM-ALBUTEROL 0.5-2.5 (3) MG/3ML IN SOLN
3.0000 mL | Freq: Two times a day (BID) | RESPIRATORY_TRACT | Status: DC
  Administered 2021-08-31 – 2021-09-02 (×4): 3 mL via RESPIRATORY_TRACT
  Filled 2021-08-31 (×4): qty 3

## 2021-08-31 MED ORDER — METOPROLOL TARTRATE 25 MG PO TABS
25.0000 mg | ORAL_TABLET | Freq: Two times a day (BID) | ORAL | Status: DC
  Administered 2021-08-31 – 2021-09-01 (×4): 25 mg via ORAL
  Filled 2021-08-31 (×4): qty 1

## 2021-08-31 NOTE — Progress Notes (Signed)
Maureen [name]   DOB:07-Sep-1940   [id number]    Subjective: Patient continues to have shortness of breath.  No cough.  Eager to go home.  She continues to be on oxygen.  Objective:  Vitals:   08/31/21 1638 08/31/21 1954  BP: 106/62   Pulse: (!) 119   Resp: 17   Temp: 98.6 F (37 C)   SpO2: (!) 89% 94%     Intake/Output Summary (Last 24 hours) at 08/31/2021 2104 Last data filed at 08/31/2021 1416 Gross per 24 hour  Intake 240 ml  Output --  Net 240 ml    Physical Exam Vitals and nursing note reviewed.  Constitutional:      Comments: Patient on 2 L of oxygen.  Alone.  HENT:     Head: Normocephalic and atraumatic.     Mouth/Throat:     Pharynx: Oropharynx is clear.  Eyes:     Extraocular Movements: Extraocular movements intact.     Pupils: Pupils are equal, round, and reactive to light.  Cardiovascular:     Rate and Rhythm: Normal rate and regular rhythm.  Pulmonary:     Comments: Decreased breath sounds bilaterally-right more than left. Abdominal:     Palpations: Abdomen is soft.  Musculoskeletal:        General: Normal range of motion.     Cervical back: Normal range of motion.  Skin:    General: Skin is warm.  Neurological:     General: No focal deficit present.     Mental Status: She is alert and oriented to person, place, and time.  Psychiatric:        Behavior: Behavior normal.        Judgment: Judgment normal.     Labs:  Lab Results  Component Value Date   WBC 24.3 (H) 08/30/2021   HGB 10.5 (L) 08/30/2021   HCT 35.7 (L) 08/30/2021   MCV 78.6 (L) 08/30/2021   PLT 195 08/30/2021   NEUTROABS 6.2 08/29/2021    Lab Results  Component Value Date   NA 136 08/31/2021   K 4.4 08/31/2021   CL 107 08/31/2021   CO2 25 08/31/2021    Studies:  CT ABDOMEN PELVIS W CONTRAST  Result Date: 08/30/2021 CLINICAL DATA:  Abdominal pain. EXAM: CT ABDOMEN AND PELVIS WITH CONTRAST TECHNIQUE: Multidetector CT imaging of the abdomen and pelvis was performed using  the standard protocol following bolus administration of intravenous contrast. RADIATION DOSE REDUCTION: This exam was performed according to the departmental dose-optimization program which includes automated exposure control, adjustment of the mA and/or kV according to patient size and/or use of iterative reconstruction technique. CONTRAST:  21mL OMNIPAQUE IOHEXOL 300 MG/ML  SOLN COMPARISON:  02/13/2018 FINDINGS: Lower chest: As on the recent CT of the chest from 08/29/2018. There is right hilar and subcarinal adenopathy. Interval decrease in volume of the right pleural effusion status post thoracentesis. Extensive postobstructive changes are identified within the right middle lobe and right lower lobe. Small left pleural effusion identified. Hepatobiliary: Ill-defined low-density lesion within the inferior right hepatic lobe measures 1.8 by 1.5 cm, image 42/2. This is a new finding compared with 02/13/2018 and is suspicious for liver metastases. Gallbladder appears partially decompressed. No signs of gallbladder wall inflammation or bile duct dilatation. Pancreas: Unremarkable. No pancreatic ductal dilatation or surrounding inflammatory changes. Spleen: Borderline enlarged spleen measures 11.3 cm in length. Calcified granulomas identified throughout the splenic parenchyma. Adrenals/Urinary Tract: Normal adrenal glands. Multiple small areas of cortical scarring noted within the  right kidney. Left kidney appears normal. No signs of mass or hydronephrosis. The urinary bladder is unremarkable. Excreted contrast material is noted within the bladder lumen. Stomach/Bowel: Stomach appears nondistended. The appendix is visualized and appears within normal limits, image 45/2. No bowel wall thickening, inflammation, or distension. Vascular/Lymphatic: Aortic atherosclerosis. Infrarenal abdominal aortic aneurysm measures 3.6 cm, image 47/2. This is compared with 2.9 cm previously. No abdominopelvic adenopathy. Reproductive:  The uterus and adnexal structures are unremarkable. Prominent bilateral parametrial veins identified which may reflect underlying pelvic venous congestion syndrome. Other: There is no free fluid or fluid collections. No signs of pneumoperitoneum. Musculoskeletal: The bones appear diffusely osteopenic. Multilevel degenerative disc disease is identified throughout the visualized thoracic and lumbar spine. No acute or suspicious osseous findings. IMPRESSION: 1. No acute findings identified within the abdomen or pelvis. 2. There is a new low-density structure within the inferior right hepatic lobe worrisome for liver metastases. 3. Decrease in volume of right pleural effusion status post thoracentesis. 4. Infrarenal abdominal aortic aneurysm measuring 3.6 cm. Recommend follow-up every 2 years. Reference: J Am Coll Radiol 4193;79:024-097. Aortic aneurysm NOS (ICD10-I71.9). 5. Prominent bilateral parametrial veins which may reflect underlying pelvic venous congestion syndrome. 6. Aortic Atherosclerosis (ICD10-I70.0). Electronically Signed   By: Kerby Moors M.D.   On: 08/30/2021 12:17   DG Chest Port 1 View  Result Date: 08/30/2021 CLINICAL DATA:  Status post right thoracentesis. EXAM: PORTABLE CHEST 1 VIEW COMPARISON:  August 29, 2021. FINDINGS: Decreased right pleural effusion, now small. Also, small left pleural effusion. Improved right mid and lower lung opacities. No visible pneumothorax. Right hilar mass better characterized on recent CT chest. Similar cardiac silhouette. IMPRESSION: 1. Decreased right pleural effusion, now small. No visible pneumothorax. 2. Improved right mid and lower lung opacities. 3. Right hilar mass better characterized on recent CT chest. Electronically Signed   By: Margaretha Sheffield M.D.   On: 08/30/2021 10:13   ECHOCARDIOGRAM COMPLETE  Result Date: 08/31/2021    ECHOCARDIOGRAM REPORT   Patient Name:   Maureen [name] Mineer Date of Exam: 08/30/2021 Medical Rec #:  [id number]        Height:       65.0 in Accession #:    [id number]      Weight:       124.0 lb Date of Birth:  June 11, 1941      BSA:          1.614 m Patient Age:    81 years        BP:           122/82 mmHg Patient Gender: F               HR:           106 bpm. Exam Location:  [facility] Procedure: 2D Echo, Cardiac Doppler and Color Doppler Indications:     Atrial Fibrillation I48.91  History:         Patient has no prior history of Echocardiogram examinations.                  Anemia.  Sonographer:     Sherrie Sport Referring Phys:  [place] Diagnosing Phys: Serafina Royals MD  Sonographer Comments: Suboptimal apical window. IMPRESSIONS  1. Left ventricular ejection fraction, by estimation, is 60 to 65%. The left ventricle has normal function. The left ventricle has no regional wall motion abnormalities. Left ventricular diastolic function could not be evaluated.  2. Right ventricular systolic function is normal.  The right ventricular size is normal.  3. Left atrial size was moderately dilated.  4. Right atrial size was moderately dilated.  5. The mitral valve is normal in structure. Mild mitral valve regurgitation.  6. The aortic valve is normal in structure. Aortic valve regurgitation is trivial. FINDINGS  Left Ventricle: Left ventricular ejection fraction, by estimation, is 60 to 65%. The left ventricle has normal function. The left ventricle has no regional wall motion abnormalities. The left ventricular internal cavity size was normal in size. There is  no left ventricular hypertrophy. Left ventricular diastolic function could not be evaluated. Right Ventricle: The right ventricular size is normal. No increase in right ventricular wall thickness. Right ventricular systolic function is normal. Left Atrium: Left atrial size was moderately dilated. Right Atrium: Right atrial size was moderately dilated. Pericardium: There is no evidence of pericardial effusion. Mitral Valve: The mitral valve is normal in structure. Mild  mitral valve regurgitation. Tricuspid Valve: The tricuspid valve is normal in structure. Tricuspid valve regurgitation is mild. Aortic Valve: The aortic valve is normal in structure. Aortic valve regurgitation is trivial. Aortic valve mean gradient measures 3.0 mmHg. Aortic valve peak gradient measures 4.5 mmHg. Aortic valve area, by VTI measures 2.47 cm. Pulmonic Valve: The pulmonic valve was normal in structure. Pulmonic valve regurgitation is not visualized. Aorta: The aortic root and ascending aorta are structurally normal, with no evidence of dilitation. IAS/Shunts: No atrial level shunt detected by color flow Doppler.  LEFT VENTRICLE PLAX 2D LVIDd:         3.60 cm LVIDs:         2.60 cm LV PW:         1.20 cm LV IVS:        0.95 cm LVOT diam:     2.00 cm LV SV:         35 LV SV Index:   21 LVOT Area:     3.14 cm  RIGHT VENTRICLE RV Basal diam:  2.60 cm RV S prime:     17.10 cm/s TAPSE (M-mode): 2.1 cm LEFT ATRIUM             Index        RIGHT ATRIUM           Index LA diam:        3.50 cm 2.17 cm/m   RA Area:     18.10 cm LA Vol (A2C):   40.3 ml 24.96 ml/m  RA Volume:   54.90 ml  34.01 ml/m LA Vol (A4C):   39.9 ml 24.71 ml/m LA Biplane Vol: 40.0 ml 24.78 ml/m  AORTIC VALVE                    PULMONIC VALVE AV Area (Vmax):    2.26 cm     PV Vmax:        0.64 m/s AV Area (Vmean):   1.88 cm     PV Vmean:       42.000 cm/s AV Area (VTI):     2.47 cm     PV VTI:         0.077 m AV Vmax:           106.00 cm/s  PV Peak grad:   1.6 mmHg AV Vmean:          73.800 cm/s  PV Mean grad:   1.0 mmHg AV VTI:            0.140  m      RVOT Peak grad: 2 mmHg AV Peak Grad:      4.5 mmHg AV Mean Grad:      3.0 mmHg LVOT Vmax:         76.40 cm/s LVOT Vmean:        44.200 cm/s LVOT VTI:          0.110 m LVOT/AV VTI ratio: 0.79  AORTA Ao Root diam: 3.67 cm MITRAL VALVE               TRICUSPID VALVE MV Area (PHT): 5.06 cm    TR Peak grad:   34.1 mmHg MV Decel Time: 150 msec    TR Vmax:        292.00 cm/s MV E velocity:  85.70 cm/s                            SHUNTS                            Systemic VTI:  0.11 m                            Systemic Diam: 2.00 cm                            Pulmonic VTI:  0.080 m Serafina Royals MD Electronically signed by Serafina Royals MD Signature Date/Time: 08/31/2021/12:12:10 PM    Final    US THORACENTESIS ASP PLEURAL SPACE W/IMG GUIDE  Result Date: 08/30/2021 INDICATION: Patient with a history of leukemia and new right hilar mass presents today with a right pleural effusion. Interventional radiology asked to perform a diagnostic and therapeutic thoracentesis. EXAM: ULTRASOUND GUIDED THORACENTESIS MEDICATIONS: 1% lidocaine 10 mL COMPLICATIONS: None immediate. PROCEDURE: An ultrasound guided thoracentesis was thoroughly discussed with the patient and questions answered. The benefits, risks, alternatives and complications were also discussed. The patient understands and wishes to proceed with the procedure. Written consent was obtained. Ultrasound was performed to localize and mark an adequate pocket of fluid in the right chest. The area was then prepped and draped in the normal sterile fashion. 1% Lidocaine was used for local anesthesia. Under ultrasound guidance a 6 Fr Safe-T-Centesis catheter was introduced. Thoracentesis was performed. The catheter was removed and a dressing applied. FINDINGS: A total of approximately 1950 mL of light red fluid was removed. Samples were sent to the laboratory as requested by the clinical team. IMPRESSION: Successful ultrasound guided right thoracentesis yielding 1950 mL of pleural fluid. Read by: Soyla Dryer, NP Electronically Signed   By: Albin Felling M.D.   On: 08/30/2021 11:22    Acute respiratory failure with hypoxia (HCC) Multifactorial as described below.  Currently comfortable on 4L [place]    Mass of upper lobe of right lung #81 year old female patient with a history of CLL; and longstanding smoking-is currently admitted to hospital for  worsening shortness of breath.  CT scan showed large right hilar mass/postoperative obstructive atelectasis; mediastinal adenopathy.   # Large right-sided hilar mass; postobstructive atelectasis; mediastinal adenopathy in the context of longstanding history of smoking-is concerning for primary lung malignancy vs. Richter's transformation.  CT abdomen pelvis-suggestive of liver lesion concerning for metastases. 2/15- Thoracentesis-flow cytometry positive for atypical lymphocytes.  However would recommend supra clavicular for biopsy for further evaluation.  Ordered by  Dr. Noralee Chars discussion with IR.  Also discussed with Dr. Patsey Berthold.  #CLL-currently ibrutinib on HOLD. White count around 20-30 overall stable.  Mild anemia hemoglobin 10-?  Etiology.  Suspect anemia of chronic disease.  Discussed with Dr. Ouida Sills.  I spoke to patient son Ronalee Belts over the phone given the findings on imaging/plan for biopsy etc.  He understands that patient unfortunately is now currently dealing with a serious problem.    Cammie Sickle, MD 08/31/2021  9:04 PM

## 2021-08-31 NOTE — Care Management Important Message (Signed)
Important Message  Patient Details  Name: Maureen [name] MRN: [id number] Date of Birth: 06-25-1941   Medicare Important Message Given:  N/A - LOS <3 / Initial given by admissions     Dannette Barbara 08/31/2021, 4:48 PM

## 2021-08-31 NOTE — Evaluation (Signed)
Occupational Therapy Evaluation Patient Details Name: Maureen [name] MRN: [id number] DOB: 07-Nov-1940 Today's Date: 08/31/2021   History of Present Illness Maureen [name] is a 81 y.o. 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   Clinical Impression   [name] was seen for OT evaluation this date. Pt was independent in all ADL and functional mobility, living in a mobile home with 4 SET and B rails. Pt currently requires SUPERVISION toilet t/f, cues for O2 tank mgmt and seated rest breaks. SpO2 90% on 1L [name] at rest, desat 84% on 2L  with mobility, resolves with seated rest. Left with PT in room. Pt educated in energy conservation strategies including pursed lip breathing and activity pacing. Pt would benefit from additional skilled OT services to educate on ECS. Upon discharge, recommend no follow up OT services.        Recommendations for follow up therapy are one component of a multi-disciplinary discharge planning process, led by the attending physician.  Recommendations may be updated based on patient status, additional functional criteria and insurance authorization.   Follow Up Recommendations  No OT follow up    Assistance Recommended at Discharge PRN  Patient can return home with the following Help with stairs or ramp for entrance    Functional Status Assessment  Patient has had a recent decline in their functional status and demonstrates the ability to make significant improvements in function in a reasonable and predictable amount of time.  Equipment Recommendations  BSC/3in1    Recommendations for Other Services       Precautions / Restrictions Precautions Precautions:  None Restrictions Weight Bearing Restrictions: No      Mobility Bed Mobility               General bed mobility comments: received and left sitting    Transfers Overall transfer level: Needs assistance Equipment used: None Transfers: Sit to/from Stand Sit to Stand: Supervision           General transfer comment: O2 line mgmt      Balance Overall balance assessment: Mild deficits observed, not formally tested                                         ADL either performed or assessed with clinical judgement   ADL Overall ADL's : Needs assistance/impaired                                       General ADL Comments: SUPERVISION toilet t/f, cues for O2 tank mgmt and seated rest breaks.      Pertinent Vitals/Pain Pain Assessment Pain Assessment: No/denies pain     Hand Dominance     Extremity/Trunk Assessment Upper Extremity Assessment Upper Extremity Assessment: Overall WFL for tasks assessed   Lower Extremity Assessment Lower Extremity Assessment: Overall WFL for tasks assessed       Communication Communication Communication: No difficulties   Cognition Arousal/Alertness: Awake/alert Behavior During Therapy: WFL for tasks assessed/performed Overall Cognitive Status: Within Functional Limits for tasks assessed  General Comments: pt is modest, poor insight into her O2 needs     General Comments  SpO2 90% on 1L [name] at rest, desat 84% on 2L [name] with mobility, resolves with seated rest            Home Living Family/patient expects to be discharged to:: Private residence Living Arrangements: Alone Available Help at Discharge: Family;Available PRN/intermittently Type of Home: Mobile home Home Access: Stairs to enter Entrance Stairs-Number of Steps: 4 Entrance Stairs-Rails: Can reach both Home Layout: One level     Bathroom Shower/Tub: Marketing executive: Shower seat - built in   Additional Comments: son is local but a Pharmacist, community, gone for long stretches      Prior Functioning/Environment Prior Level of Function : Independent/Modified Independent;Driving             Mobility Comments: no AD ADLs Comments: complete I/ADLs        OT Problem List: Decreased activity tolerance;Impaired balance (sitting and/or standing)      OT Treatment/Interventions: Self-care/ADL training;Therapeutic exercise;Energy conservation;DME and/or AE instruction;Therapeutic activities;Patient/family education;Balance training    OT Goals(Current goals can be found in the care plan section) Acute Rehab OT Goals Patient Stated Goal: to breathe better OT Goal Formulation: With patient Time For Goal Achievement: 10/02/2021 Potential to Achieve Goals: Good ADL Goals Pt Will Perform Grooming: Independently;standing (will tolerate >10 mins) Pt Will Perform Tub/Shower Transfer: Shower transfer;Independently;ambulating Additional ADL Goal #1: Pt will Independently verbalize plan to implement x3 ECS  OT Frequency: Min 1X/week    Co-evaluation              AM-PAC OT "6 Clicks" Daily Activity     Outcome Measure Help from another person eating meals?: None Help from another person taking care of personal grooming?: A Little Help from another person toileting, which includes using toliet, bedpan, or urinal?: A Little Help from another person bathing (including washing, rinsing, drying)?: A Little Help from another person to put on and taking off regular upper body clothing?: None Help from another person to put on and taking off regular lower body clothing?: A Little 6 Click Score: 20   End of Session Equipment Utilized During Treatment: Oxygen  Activity Tolerance: Patient tolerated treatment well Patient left: Other (comment) (with PT)  OT Visit Diagnosis: Muscle weakness (generalized) (M62.81)                Time: 4497-5300 OT Time  Calculation (min): 8 min Charges:  OT General Charges $OT Visit: 1 Visit OT Evaluation $OT Eval Low Complexity: 1 Low  Dessie Coma, M.S. OTR/L  08/31/21, 10:51 AM  ascom [phone]

## 2021-08-31 NOTE — Evaluation (Addendum)
Physical Therapy Evaluation Patient Details Name: Maureen [name] MRN: [id number] DOB: [birth year] Today's Date: 08/31/2021  History of Present Illness  Patient is a 81 y.o. 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  Work up ongoing. S/p thoracentesis  Clinical Impression  Patient agreeable to PT evaluation. Overall she is moving around well with limited activity tolerance. Sp02 decreases to 85% on 2 L02 with activity, but increases to 90% or above with rest break and cues for breathing techniques. Patient ambulated 41ft in hallway without assistive device and went up/down 3 steps with assistance. Education provided for energy conservation techniques to be used in home setting. Recommend PT follow up to maximize independence as patient lives alone at home.      Recommendations for follow up therapy are one component of a multi-disciplinary discharge planning process, led by the attending physician.  Recommendations may be updated based on patient status, additional functional criteria and insurance authorization.  Follow Up Recommendations Home health PT    Assistance Recommended at Discharge PRN  Patient can return home with the following  Assist for transportation;Help with stairs or ramp for entrance    Equipment Recommendations None recommended by PT  Recommendations for Other Services       Functional Status Assessment Patient has had a recent decline in their functional status and demonstrates the ability to make significant improvements in function in a reasonable and predictable amount of time.     Precautions / Restrictions Precautions Precautions: None Restrictions Weight Bearing Restrictions: No      Mobility  Bed Mobility               General bed mobility comments: not assessed as patient  sitting up on arrival to room and post session    Transfers Overall transfer level: Needs assistance Equipment used: None Transfers: Sit to/from Stand Sit to Stand: Supervision           General transfer comment: supervision for safety with occasional cues for management of 02 line    Ambulation/Gait Ambulation/Gait assistance: Supervision Gait Distance (Feet): 75 Feet Assistive device: None Gait Pattern/deviations: Step-to pattern Gait velocity: decreased     General Gait Details: patient has mild shortness of breath with activity with Sp02 decreasing as low as 85% with ambulation on 2 L02. cues for activity cessation, breathing techniques, and energy conservation. no loss of balance without assistive device but activity tolerance is limited by fatigue  Stairs Stairs: Yes Stairs assistance: Min guard Stair Management: One rail Right, Forwards, Alternating pattern Number of Stairs: 3 General stair comments: cues for safety and technique going up steps with one railing. patient was fatigued with activity and Sp02 85% while performing stair training  Wheelchair Mobility    Modified Rankin (Stroke Patients Only)       Balance Overall balance assessment: Needs assistance Sitting-balance support: Feet supported Sitting balance-Leahy Scale: Good     Standing balance support: No upper extremity supported, During functional activity Standing balance-Leahy Scale: Fair Standing balance comment: without UE support in standing                             Pertinent Vitals/Pain Pain Assessment Pain Assessment: No/denies pain    Home Living Family/patient expects to be discharged to:: Private residence Living Arrangements:  Alone Available Help at Discharge: Family;Available PRN/intermittently Type of Home: Mobile home Home Access: Stairs to enter Entrance Stairs-Rails: Can reach both Entrance Stairs-Number of Steps: 4   Home Layout: One level Home  Equipment: Shower seat - built in Additional Comments: son is local but a Pharmacist, community, gone for long stretches    Prior Function Prior Level of Function : Independent/Modified Independent;Driving             Mobility Comments: no AD ADLs Comments: complete I/ADLs     Hand Dominance        Extremity/Trunk Assessment   Upper Extremity Assessment Upper Extremity Assessment: Overall WFL for tasks assessed    Lower Extremity Assessment Lower Extremity Assessment:  (endurance imparied for sustianed activity in standing with no focal weakness noted with exam)       Communication   Communication: No difficulties  Cognition Arousal/Alertness: Awake/alert Behavior During Therapy: WFL for tasks assessed/performed Overall Cognitive Status: Within Functional Limits for tasks assessed                                 General Comments: patient able to follow all commands without difficulty.        General Comments General comments (skin integrity, edema, etc.): Sp02 decreased to 85% on 2 L02 at times and quickly increases to 90% or above with short seated rest break and cues for breathing    Exercises     Assessment/Plan    PT Assessment Patient needs continued PT services  PT Problem List Decreased activity tolerance;Decreased mobility;Decreased safety awareness;Decreased knowledge of precautions       PT Treatment Interventions DME instruction;Gait training;Stair training;Functional mobility training;Therapeutic activities;Therapeutic exercise;Balance training;Neuromuscular re-education;Patient/family education    PT Goals (Current goals can be found in the Care Plan section)  Acute Rehab PT Goals Patient Stated Goal: to return home PT Goal Formulation: With patient Time For Goal Achievement: 10/07/2021 Potential to Achieve Goals: Good    Frequency Min 2X/week     Co-evaluation               AM-PAC PT "6 Clicks" Mobility  Outcome Measure Help  needed turning from your back to your side while in a flat bed without using bedrails?: None Help needed moving from lying on your back to sitting on the side of a flat bed without using bedrails?: A Little Help needed moving to and from a bed to a chair (including a wheelchair)?: A Little Help needed standing up from a chair using your arms (e.g., wheelchair or bedside chair)?: A Little Help needed to walk in hospital room?: A Little Help needed climbing 3-5 steps with a railing? : A Little 6 Click Score: 19    End of Session Equipment Utilized During Treatment: Oxygen Activity Tolerance: Patient tolerated treatment well Patient left: in chair;with call bell/phone within reach   PT Visit Diagnosis: Muscle weakness (generalized) (M62.81)    Time: 9458-5929 PT Time Calculation (min) (ACUTE ONLY): 16 min   Charges:   PT Evaluation $PT Eval Low Complexity: 1 Low         Minna Merritts, PT, MPT   Percell Locus 08/31/2021, 11:30 AM

## 2021-08-31 NOTE — Progress Notes (Signed)
Per CCMD, patient is a fib with RVR rate 130-150s.  MD notified in hallway, 12 lead EKG and PO metoprolol ordered.  12 lead confirms a fib RVR and MD notified of result, PO metoprolol given per order.  Will continue to monitor and implement new orders.

## 2021-08-31 NOTE — H&P (Addendum)
Chief Complaint: Patient was seen in consultation today for supraclavicular lymph node biopsy  at the request of Dr. Lowella Curb  Referring Physician(s): Dr. Lowella Curb  Supervising Physician: Juliet Rude  Patient Status: [facility] - In-pt  History of Present Illness: Maureen [name] is a 81 y.o. female with PMH of renal mass, anemia, thrombocytopenia and CLL currently on oral chemotherapy.  Patient presented to ED complaining of worsening shortness of breath and cough.  Patient was admitted for acute hypoxic respiratory failure.  Patient was found to have large right hilar mass with right pleural effusion consistent with malignancy.  Dr. Lowella Curb consulted with Dr. Kathlene Cote about possible neck node biopsy.  Dr. Kathlene Cote approved right supraclavicular lymph node biopsy pending further imaging.  Procedure is tentatively scheduled for 09/01/2021.  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  Medications: Prior to Admission medications   Medication Sig Start Date End Date Taking? Authorizing Provider  amLODipine (NORVASC) 2.5 MG tablet Take 1 tablet (2.5 mg total) by mouth daily. 05/12/21  Yes Juline Patch, MD  Calcium Carbonate-Vit D-Min (CALCIUM 1200) 1200-1000 MG-UNIT CHEW Chew 1 tablet by mouth daily. 06/13/21  Yes Juline Patch, MD  Cholecalciferol (VITAMIN D3) 20 MCG (800 UNIT) TABS Take 1 tablet by mouth daily. 06/13/21  Yes Juline Patch, MD  folic acid (FOLVITE) 563 MCG tablet Take 800 mcg by mouth 3 (three) times a week.    Yes [provider]  ibrutinib (IMBRUVICA) 420 MG tablet Take 1 tablet by mouth daily. 08/29/21 08/29/22 Yes Sindy Guadeloupe, MD  vitamin B-12 (CYANOCOBALAMIN)  1000 MCG tablet Take 1,000 mcg by mouth. Takes three times a week   Yes [provider]  alendronate (FOSAMAX) 70 MG tablet Take 1 tablet (70 mg total) by mouth every 7 (seven) days. Take with a full glass of water on an empty stomach. 06/13/21   Juline Patch, MD     Family History  Family history unknown: Yes    Social History   Socioeconomic History   Marital status: Divorced    Spouse name: Not on file   Number of children: 2   Years of education: Not on file   Highest education level: 9th grade  Occupational History   Occupation: Retired  Tobacco Use   Smoking status: Former    Packs/day: 0.25    Years: 56.00    Pack years: 14.00    Types: Cigarettes    Quit date: 07/16/2021    Years since quitting: 0.1   Smokeless tobacco: Never  Vaping Use   Vaping Use: Never used  Substance and Sexual Activity   Alcohol use: Yes    Alcohol/week: 1.0 standard drink    Types: 1 Glasses of wine per week    Comment: occassional   Drug use: No   Sexual activity: Not Currently  Other Topics Concern   Not on file  Social History Narrative   Pt lives alone.    Social Determinants of Health   Financial Resource Strain: High Risk   Difficulty of Paying Living Expenses: Hard  Food Insecurity: No Food Insecurity   Worried About  Running Out of Food in the Last Year: Never true   Ran Out of Food in the Last Year: Never true  Transportation Needs: No Transportation Needs   Lack of Transportation (Medical): No   Lack of Transportation (Non-Medical): No  Physical Activity: Inactive   Days of Exercise per Week: 0 days   Minutes of Exercise per Session: 0 min  Stress: No Stress Concern Present   Feeling of Stress : Only a little  Social Connections: Socially Isolated   Frequency of Communication with Friends and Family: More than three times a week   Frequency of Social Gatherings with Friends and Family: Once a week   Attends Religious Services: Never   Corporate treasurer or Organizations: No   Attends Music therapist: Never   Marital Status: Divorced    Review of Systems: A 12 point ROS discussed and pertinent positives are indicated in the HPI above.  All other systems are negative.  Review of Systems  Constitutional:  Negative for chills and fever.  HENT:  Negative for nosebleeds.   Eyes:  Negative for visual disturbance.  Respiratory:  Positive for cough and shortness of breath.   Cardiovascular:  Negative for chest pain and leg swelling.  Gastrointestinal:  Negative for abdominal pain, blood in stool, nausea and vomiting.  Genitourinary:  Negative for hematuria.  Neurological:  Negative for dizziness, light-headedness and headaches.   Vital Signs: BP 107/66 (BP Location: Right Arm)    Pulse (!) 122    Temp 98 F (36.7 C) (Oral)    Resp 16    Ht 5\' 5"  (1.651 m)    Wt 124 lb (56.2 kg)    SpO2 91%    BMI 20.63 kg/m   Physical Exam Constitutional:      Appearance: She is ill-appearing.  HENT:     Head: Normocephalic and atraumatic.     Nose:     Comments: O2 via [place]    Mouth/Throat:     Mouth: Mucous membranes are dry.     Pharynx: Oropharynx is clear.  Eyes:     Extraocular Movements: Extraocular movements intact.     Pupils: Pupils are equal, round, and reactive to light.  Cardiovascular:     Rate and Rhythm: Tachycardia present. Rhythm irregular.     Pulses: Normal pulses.     Heart sounds: Normal heart sounds. No murmur heard.   No friction rub. No gallop.  Pulmonary:     Effort: Pulmonary effort is normal. No respiratory distress.     Breath sounds: No stridor. Wheezing and rales present. No rhonchi.     Comments: Patient has expiratory wheezing to the right upper field.  Crackles noted to bilateral lower lobes. Chest:     Chest wall: No tenderness.  Abdominal:     General: Bowel sounds are normal. There is no distension.     Tenderness: There is no abdominal tenderness. There is no guarding.  Musculoskeletal:      Right lower leg: No edema.     Left lower leg: No edema.  Skin:    General: Skin is warm and dry.  Neurological:     Mental Status: She is alert and oriented to person, place, and time.  Psychiatric:        Mood and Affect: Mood normal.        Behavior: Behavior normal.        Thought Content: Thought content normal.  Judgment: Judgment normal.    Imaging: DG Lumbar Spine Complete  Result Date: 08/29/2021 CLINICAL DATA:  Back pain. EXAM: LUMBAR SPINE - COMPLETE 4+ VIEW COMPARISON:  None. FINDINGS: Mild lumbar scoliosis. Mild retrolisthesis L2-3. Otherwise normal alignment. Negative for fracture or mass. Atherosclerotic calcification abdominal aorta. Disc degeneration throughout the lumbar spine with disc space narrowing and spurring IMPRESSION: Multilevel lumbar spondylosis with scoliosis.  No acute abnormality. Electronically Signed   By: Franchot Gallo M.D.   On: 08/29/2021 13:46   CT Angio Chest PE W and/or Wo Contrast  Result Date: 08/29/2021 CLINICAL DATA:  Productive cough, shortness of breath EXAM: CT ANGIOGRAPHY CHEST WITH CONTRAST TECHNIQUE: Multidetector CT imaging of the chest was performed using the standard protocol during bolus administration of intravenous contrast. Multiplanar CT image reconstructions and MIPs were obtained to evaluate the vascular anatomy. RADIATION DOSE REDUCTION: This exam was performed according to the departmental dose-optimization program which includes automated exposure control, adjustment of the mA and/or kV according to patient size and/or use of iterative reconstruction technique. CONTRAST:  45mL OMNIPAQUE IOHEXOL 350 MG/ML SOLN COMPARISON:  09/01/2008 FINDINGS: Cardiovascular: This is a technically adequate evaluation of the pulmonary vasculature. No filling defects or pulmonary emboli. The heart is unremarkable without pericardial effusion. Normal caliber of the thoracic aorta. Mild atherosclerosis of the aortic arch and coronary  vasculature. Mediastinum/Nodes: Large soft tissue mass at the right hilum measures up to 3.7 x 3.6 cm, with near complete obstruction of the bronchus intermedius. Mediastinal lymphadenopathy is also identified, measuring 1.8 cm in the precarinal station and up to 3 cm in the subcarinal region. Thyroid, trachea, and esophagus are otherwise unremarkable. Lungs/Pleura: Large right pleural effusion. The right hilar mass described above results in near complete obstruction of the bronchus intermedius as well as the right middle and right lower lobe bronchi, with dense right middle and right lower lobe consolidation consistent with postobstructive change. Background emphysema. No pneumothorax. The left chest is clear. Upper Abdomen: No acute abnormality. Musculoskeletal: No acute or destructive bony lesions. Reconstructed images demonstrate no additional findings. Review of the MIP images confirms the above findings. IMPRESSION: 1. Large right hilar mass, with near complete obstruction of the bronchus intermedius, right middle lobe bronchus, and right lower lobe bronchus, consistent with malignancy. 2. Mediastinal lymphadenopathy consistent with nodal metastases. 3. Large right pleural effusion. 4. Dense consolidation within the right middle and right lower lobes with associated volume loss. Findings are compatible with postobstructive change. 5. No evidence of pulmonary embolus. 6.  Aortic Atherosclerosis (ICD10-I70.0). Electronically Signed   By: Randa Ngo M.D.   On: 08/29/2021 16:21   CT ABDOMEN PELVIS W CONTRAST  Result Date: 08/30/2021 CLINICAL DATA:  Abdominal pain. EXAM: CT ABDOMEN AND PELVIS WITH CONTRAST TECHNIQUE: Multidetector CT imaging of the abdomen and pelvis was performed using the standard protocol following bolus administration of intravenous contrast. RADIATION DOSE REDUCTION: This exam was performed according to the departmental dose-optimization program which includes automated exposure  control, adjustment of the mA and/or kV according to patient size and/or use of iterative reconstruction technique. CONTRAST:  72mL OMNIPAQUE IOHEXOL 300 MG/ML  SOLN COMPARISON:  02/13/2018 FINDINGS: Lower chest: As on the recent CT of the chest from 08/29/2018. There is right hilar and subcarinal adenopathy. Interval decrease in volume of the right pleural effusion status post thoracentesis. Extensive postobstructive changes are identified within the right middle lobe and right lower lobe. Small left pleural effusion identified. Hepatobiliary: Ill-defined low-density lesion within the inferior right hepatic lobe measures  1.8 by 1.5 cm, image 42/2. This is a new finding compared with 02/13/2018 and is suspicious for liver metastases. Gallbladder appears partially decompressed. No signs of gallbladder wall inflammation or bile duct dilatation. Pancreas: Unremarkable. No pancreatic ductal dilatation or surrounding inflammatory changes. Spleen: Borderline enlarged spleen measures 11.3 cm in length. Calcified granulomas identified throughout the splenic parenchyma. Adrenals/Urinary Tract: Normal adrenal glands. Multiple small areas of cortical scarring noted within the right kidney. Left kidney appears normal. No signs of mass or hydronephrosis. The urinary bladder is unremarkable. Excreted contrast material is noted within the bladder lumen. Stomach/Bowel: Stomach appears nondistended. The appendix is visualized and appears within normal limits, image 45/2. No bowel wall thickening, inflammation, or distension. Vascular/Lymphatic: Aortic atherosclerosis. Infrarenal abdominal aortic aneurysm measures 3.6 cm, image 47/2. This is compared with 2.9 cm previously. No abdominopelvic adenopathy. Reproductive: The uterus and adnexal structures are unremarkable. Prominent bilateral parametrial veins identified which may reflect underlying pelvic venous congestion syndrome. Other: There is no free fluid or fluid collections. No  signs of pneumoperitoneum. Musculoskeletal: The bones appear diffusely osteopenic. Multilevel degenerative disc disease is identified throughout the visualized thoracic and lumbar spine. No acute or suspicious osseous findings. IMPRESSION: 1. No acute findings identified within the abdomen or pelvis. 2. There is a new low-density structure within the inferior right hepatic lobe worrisome for liver metastases. 3. Decrease in volume of right pleural effusion status post thoracentesis. 4. Infrarenal abdominal aortic aneurysm measuring 3.6 cm. Recommend follow-up every 2 years. Reference: J Am Coll Radiol 1829;93:716-967. Aortic aneurysm NOS (ICD10-I71.9). 5. Prominent bilateral parametrial veins which may reflect underlying pelvic venous congestion syndrome. 6. Aortic Atherosclerosis (ICD10-I70.0). Electronically Signed   By: Kerby Moors M.D.   On: 08/30/2021 12:17   DG Chest Port 1 View  Result Date: 08/30/2021 CLINICAL DATA:  Status post right thoracentesis. EXAM: PORTABLE CHEST 1 VIEW COMPARISON:  August 29, 2021. FINDINGS: Decreased right pleural effusion, now small. Also, small left pleural effusion. Improved right mid and lower lung opacities. No visible pneumothorax. Right hilar mass better characterized on recent CT chest. Similar cardiac silhouette. IMPRESSION: 1. Decreased right pleural effusion, now small. No visible pneumothorax. 2. Improved right mid and lower lung opacities. 3. Right hilar mass better characterized on recent CT chest. Electronically Signed   By: Margaretha Sheffield M.D.   On: 08/30/2021 10:13   DG Chest Portable 1 View  Result Date: 08/29/2021 CLINICAL DATA:  Chest pain short of breath EXAM: PORTABLE CHEST 1 VIEW COMPARISON:  05/07/2007.  Chest CT 09/26/2021 FINDINGS: Large area of increased density in the right base. This is due to a combination of pleural effusion and airspace disease. Left lung is clear. Heart seems mild scratch the heart size mildly enlarged. Vascularity  normal. IMPRESSION: Extensive airspace density in the right lung base most consistent with effusion and airspace consolidation. Suspect underlying right lower lobe mass lesion as identified on CT chest earlier today. Electronically Signed   By: Franchot Gallo M.D.   On: 08/29/2021 17:07   ECHOCARDIOGRAM COMPLETE  Result Date: 08/31/2021    ECHOCARDIOGRAM REPORT   Patient Name:   ELTHA TINGLEY Niedermeier Date of Exam: 08/30/2021 Medical Rec #:  [id number]       Height:       65.0 in Accession #:    [id number]      Weight:       124.0 lb Date of Birth:  May 17, 1941      BSA:  1.614 m Patient Age:    39 years        BP:           122/82 mmHg Patient Gender: F               HR:           106 bpm. Exam Location:  [facility] Procedure: 2D Echo, Cardiac Doppler and Color Doppler Indications:     Atrial Fibrillation I48.91  History:         Patient has no prior history of Echocardiogram examinations.                  Anemia.  Sonographer:     Sherrie Sport Referring Phys:  [place] Diagnosing Phys: Serafina Royals MD  Sonographer Comments: Suboptimal apical window. IMPRESSIONS  1. Left ventricular ejection fraction, by estimation, is 60 to 65%. The left ventricle has normal function. The left ventricle has no regional wall motion abnormalities. Left ventricular diastolic function could not be evaluated.  2. Right ventricular systolic function is normal. The right ventricular size is normal.  3. Left atrial size was moderately dilated.  4. Right atrial size was moderately dilated.  5. The mitral valve is normal in structure. Mild mitral valve regurgitation.  6. The aortic valve is normal in structure. Aortic valve regurgitation is trivial. FINDINGS  Left Ventricle: Left ventricular ejection fraction, by estimation, is 60 to 65%. The left ventricle has normal function. The left ventricle has no regional wall motion abnormalities. The left ventricular internal cavity size was normal in size. There is  no left ventricular  hypertrophy. Left ventricular diastolic function could not be evaluated. Right Ventricle: The right ventricular size is normal. No increase in right ventricular wall thickness. Right ventricular systolic function is normal. Left Atrium: Left atrial size was moderately dilated. Right Atrium: Right atrial size was moderately dilated. Pericardium: There is no evidence of pericardial effusion. Mitral Valve: The mitral valve is normal in structure. Mild mitral valve regurgitation. Tricuspid Valve: The tricuspid valve is normal in structure. Tricuspid valve regurgitation is mild. Aortic Valve: The aortic valve is normal in structure. Aortic valve regurgitation is trivial. Aortic valve mean gradient measures 3.0 mmHg. Aortic valve peak gradient measures 4.5 mmHg. Aortic valve area, by VTI measures 2.47 cm. Pulmonic Valve: The pulmonic valve was normal in structure. Pulmonic valve regurgitation is not visualized. Aorta: The aortic root and ascending aorta are structurally normal, with no evidence of dilitation. IAS/Shunts: No atrial level shunt detected by color flow Doppler.  LEFT VENTRICLE PLAX 2D LVIDd:         3.60 cm LVIDs:         2.60 cm LV PW:         1.20 cm LV IVS:        0.95 cm LVOT diam:     2.00 cm LV SV:         35 LV SV Index:   21 LVOT Area:     3.14 cm  RIGHT VENTRICLE RV Basal diam:  2.60 cm RV S prime:     17.10 cm/s TAPSE (M-mode): 2.1 cm LEFT ATRIUM             Index        RIGHT ATRIUM           Index LA diam:        3.50 cm 2.17 cm/m   RA Area:     18.10 cm  LA Vol (A2C):   40.3 ml 24.96 ml/m  RA Volume:   54.90 ml  34.01 ml/m LA Vol (A4C):   39.9 ml 24.71 ml/m LA Biplane Vol: 40.0 ml 24.78 ml/m  AORTIC VALVE                    PULMONIC VALVE AV Area (Vmax):    2.26 cm     PV Vmax:        0.64 m/s AV Area (Vmean):   1.88 cm     PV Vmean:       42.000 cm/s AV Area (VTI):     2.47 cm     PV VTI:         0.077 m AV Vmax:           106.00 cm/s  PV Peak grad:   1.6 mmHg AV Vmean:          73.800  cm/s  PV Mean grad:   1.0 mmHg AV VTI:            0.140 m      RVOT Peak grad: 2 mmHg AV Peak Grad:      4.5 mmHg AV Mean Grad:      3.0 mmHg LVOT Vmax:         76.40 cm/s LVOT Vmean:        44.200 cm/s LVOT VTI:          0.110 m LVOT/AV VTI ratio: 0.79  AORTA Ao Root diam: 3.67 cm MITRAL VALVE               TRICUSPID VALVE MV Area (PHT): 5.06 cm    TR Peak grad:   34.1 mmHg MV Decel Time: 150 msec    TR Vmax:        292.00 cm/s MV E velocity: 85.70 cm/s                            SHUNTS                            Systemic VTI:  0.11 m                            Systemic Diam: 2.00 cm                            Pulmonic VTI:  0.080 m Serafina Royals MD Electronically signed by Serafina Royals MD Signature Date/Time: 08/31/2021/12:12:10 PM    Final    US THORACENTESIS ASP PLEURAL SPACE W/IMG GUIDE  Result Date: 08/30/2021 INDICATION: Patient with a history of leukemia and new right hilar mass presents today with a right pleural effusion. Interventional radiology asked to perform a diagnostic and therapeutic thoracentesis. EXAM: ULTRASOUND GUIDED THORACENTESIS MEDICATIONS: 1% lidocaine 10 mL COMPLICATIONS: None immediate. PROCEDURE: An ultrasound guided thoracentesis was thoroughly discussed with the patient and questions answered. The benefits, risks, alternatives and complications were also discussed. The patient understands and wishes to proceed with the procedure. Written consent was obtained. Ultrasound was performed to localize and mark an adequate pocket of fluid in the right chest. The area was then prepped and draped in the normal sterile fashion. 1% Lidocaine was used for local anesthesia. Under ultrasound guidance a 6 Fr Safe-T-Centesis catheter was introduced. Thoracentesis was  performed. The catheter was removed and a dressing applied. FINDINGS: A total of approximately 1950 mL of light red fluid was removed. Samples were sent to the laboratory as requested by the clinical team. IMPRESSION: Successful  ultrasound guided right thoracentesis yielding 1950 mL of pleural fluid. Read by: Soyla Dryer, NP Electronically Signed   By: Albin Felling M.D.   On: 08/30/2021 11:22    Labs:  CBC: Recent Labs    04/05/21 0921 07/12/21 0944 08/29/21 1405 08/30/21 0511  WBC 36.8* 23.5* 36.3* 24.3*  HGB 12.6 11.8* 11.0* 10.5*  HCT 40.4 37.8 37.5 35.7*  PLT 160 161 231 195    COAGS: Recent Labs    08/29/21 1405  INR 1.1  APTT 31    BMP: Recent Labs    07/12/21 0944 08/29/21 1405 08/30/21 0511 08/31/21 0443  NA 134* 137 136 136  K 3.6 3.6 3.3* 4.4  CL 98 107 104 107  CO2 26 19* 22 25  GLUCOSE 94 105* 103* 101*  BUN 15 22 14 17   CALCIUM 8.4* 8.1* 8.0* 8.3*  CREATININE 0.71 0.51 0.45 0.48  GFRNONAA >60 >60 >60 >60    LIVER FUNCTION TESTS: Recent Labs    11/30/20 1057 04/05/21 0921 07/12/21 0944 08/29/21 1405  BILITOT 0.8 0.8 0.5 1.0  AST 15 15 16 21   ALT 13 10 9 12   ALKPHOS 42 44 50 55  PROT 6.2* 6.2* 5.8* 5.2*  ALBUMIN 4.0 4.1 3.7 2.9*    TUMOR MARKERS: No results for input(s): AFPTM, CEA, CA199, CHROMGRNA in the last 8760 hours.  Assessment and Plan: History of renal mass, anemia, thrombocytopenia and CLL currently on oral chemotherapy.  Patient presented to ED complaining of worsening shortness of breath and cough.  Patient was admitted for acute hypoxic respiratory failure.  Patient was found to have large right hilar mass with right pleural effusion consistent with malignancy.  Dr. Lowella Curb consulted with Dr. Kathlene Cote about possible neck node biopsy.  Dr. Kathlene Cote approved right supraclavicular lymph node biopsy pending further imaging.  Procedure is tentatively scheduled for 09/01/2021.  Patient resting in bed watching TV. She is alert and oriented, calm and pleasant.  Patient was advised she will have nothing by mouth after midnight tonight in preparation for tomorrow's procedure.  Patient's white blood count is trending down.  INR is 1.1.  She is afebrile  and vital signs are stable.  Risks and benefits of supraclavicular lymph node biopsy was discussed with the patient and/or patient's family including, but not limited to bleeding, infection, damage to adjacent structures or low yield requiring additional tests.  All of the questions were answered and there is agreement to proceed.  Consent signed and in chart.   Thank you for this interesting consult.  I greatly enjoyed meeting Maureen [name] and look forward to participating in their care.  A copy of this report was sent to the requesting provider on this date.  Electronically Signed: Tyson Alias, NP 08/31/2021, 4:21 PM   I spent a total of 20 minutes in face to face in clinical consultation, greater than 50% of which was counseling/coordinating care for image guided supraclavicular lymph node biopsy.

## 2021-08-31 NOTE — Progress Notes (Signed)
Progress Note   Patient: Maureen [name] [id number]:[id number] DOB: 11/18/1940 DOA: 08/29/2021     2 DOS: the patient was seen and examined on 08/31/2021   Brief hospital course: Ms. 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  She presented to the ED via EMS from the [place] urgent care on 2/14 due to shortness of breath and productive cough that has been present for 4 days.  In the ED, she was found to have a new oxygen requirement of 3 L.  She was evaluated for PE which was negative but incidentally on CTA she was found to have a large right hilar mass with right pleural effusion consistent with malignancy.  Heme-onc was consulted to evaluate. She was started on IV antibiotics and IR was consulted for a thoracentesis with diagnostic labs. Additionally, patient was seen to be in A-fib with RVR.  She was overall hemodynamically stable and responded well to IV metoprolol.  Assessment and Plan: Acute hypoxic respiratory failure- Multifactorial as described below.  - Currently comfortable on 2L [place]   postobstructive pneumonia   large hilar mass   large Right pleural effusion- CTA on 2/14 showed large right hilar mass with near complete obstruction of bronchus intermedius, right middle lobe bronchus, right lower lobe bronchus, Mediastinal lymphadenopathy consistent with nodal metastases, Large right pleural effusion, Dense consolidation within the right middle and right lower lobes with associated volume loss. No evidence of pulmonary embolus. - continue CTX, azithromycin - IR consulted - US guided thoracentesis 2/15 - diagnostic labs pending which will help guide management decisions - heme/onc consulted - CT abdomen pelvis showing additional liver lesion - continued scheduled duonebs - wean oxygen as tolerated - consulted pulmonology, appreciate recs  Simple cystitis- urinalysis positive for nitrites, RBCs, leuks  and bacteria.  Patient denied urinary symptoms on admission. CTX began for PNA so will continue. Ur Cx pending - continue CTX - follow up urinalysis as OP when resolved to ensure resolution of hematuria  New onset Afib with RVR- confirmed on ECG. Tachycardic intermittently. asymptomatic and otherwise hemodynamically stable. Responded well to metoprolol. Currently not a candidate for anticoagulation with planned procedures. HR low 100s this am.  - scheduled metoprolol - telemetry  HTN- chronic, stable - continue home amlodipine   CLL:  - heme/onc following, appreciate recs - holding home Imbruvica  Mild hypokalemia- resolved. K+4.4 -Replete as needed -BMP a.m.   Subjective: Patient reports doing well today. She thinks her respiratory status has improved. Denies any complaints or concerns.  Physical Exam: Vitals:   08/30/21 2100 08/30/21 2312 08/31/21 0025 08/31/21 0401  BP:   (!) 105/59 97/62  Pulse:   (!) 107 88  Resp: 20 20 18 19   Temp:   97.6 F (36.4 C) 98 F (36.7 C)  TempSrc:    Oral  SpO2:   94% 93%  Weight:      Height:       General: NAD, pleasant, able to participate in exam, frail. Cardiac: RRR, normal heart sounds, no murmurs. 2+ radial and PT pulses bilaterally Respiratory: CTAB, normal effort Extremities: no edema. WWP. Skin: warm and dry, no rashes noted on exposed skin Neuro: alert and oriented, no focal deficits Psych: Normal affect and mood  Data Reviewed: Urinalysis    Component Value Date/Time   COLORURINE AMBER (A) 08/29/2021 1405   APPEARANCEUR HAZY (A) 08/29/2021 1405   APPEARANCEUR CLOUDY 03/17/2013 0959   LABSPEC 1.024  08/29/2021 1405   LABSPEC 1.020 03/17/2013 0959   PHURINE 5.0 08/29/2021 1405   GLUCOSEU NEGATIVE 08/29/2021 1405   GLUCOSEU NEGATIVE 03/17/2013 0959   HGBUR MODERATE (A) 08/29/2021 1405   BILIRUBINUR NEGATIVE 08/29/2021 1405   BILIRUBINUR negative 10/14/2017 0907   BILIRUBINUR NEGATIVE 03/17/2013 0959   KETONESUR 5  (A) 08/29/2021 1405   PROTEINUR 100 (A) 08/29/2021 1405   UROBILINOGEN 0.2 10/14/2017 0907   NITRITE NEGATIVE 08/29/2021 1405   LEUKOCYTESUR LARGE (A) 08/29/2021 1405   LEUKOCYTESUR 2+ 03/17/2013 0959   BMP Latest Ref Rng & Units 08/31/2021 08/30/2021 08/29/2021  Glucose 70 - 99 mg/dL 101(H) 103(H) 105(H)  BUN 8 - 23 mg/dL 17 14 22   Creatinine 0.44 - 1.00 mg/dL 0.48 0.45 0.51  Sodium 135 - 145 mmol/L 136 136 137  Potassium 3.5 - 5.1 mmol/L 4.4 3.3(L) 3.6  Chloride 98 - 111 mmol/L 107 104 107  CO2 22 - 32 mmol/L 25 22 19(L)  Calcium 8.9 - 10.3 mg/dL 8.3(L) 8.0(L) 8.1(L)   CBC    Component Value Date/Time   WBC 24.3 (H) 08/30/2021 0511   RBC 4.54 08/30/2021 0511   HGB 10.5 (L) 08/30/2021 0511   HGB 14.1 10/21/2014 0914   HCT 35.7 (L) 08/30/2021 0511   HCT 43.8 10/21/2014 0914   PLT 195 08/30/2021 0511   PLT 175 08/08/2017 1052   MCV 78.6 (L) 08/30/2021 0511   MCV 93 10/21/2014 0914   MCH 23.1 (L) 08/30/2021 0511   MCHC 29.4 (L) 08/30/2021 0511   RDW 17.2 (H) 08/30/2021 0511   RDW 14.2 10/21/2014 0914   LYMPHSABS 29.2 (H) 08/29/2021 1405   LYMPHSABS 21.1 (H) 10/21/2014 0914   MONOABS 0.7 08/29/2021 1405   MONOABS 0.7 10/21/2014 0914   EOSABS 0.0 08/29/2021 1405   EOSABS 0.1 10/21/2014 0914   BASOSABS 0.1 08/29/2021 1405   BASOSABS 0.1 10/21/2014 0914   Family Communication: none  Disposition: Status is: Inpatient Remains inpatient appropriate because: Ongoing medical evaluation and treatment requiring inpatient level of care   Planned Discharge Destination:  TBD  enoxaparin (LOVENOX) injection 40 mg Start: 08/29/21 2200   Time spent: >60 minutes  Author: Richarda Osmond, MD 08/31/2021 7:17 AM  For on call review [email or web address].

## 2021-09-01 ENCOUNTER — Inpatient Hospital Stay: Payer: Medicare Other

## 2021-09-01 DIAGNOSIS — J9601 Acute respiratory failure with hypoxia: Secondary | ICD-10-CM | POA: Diagnosis not present

## 2021-09-01 LAB — COMP PANEL: LEUKEMIA/LYMPHOMA

## 2021-09-01 NOTE — Progress Notes (Signed)
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  She presented to the ED via EMS from the [facility] urgent care on 2/14 due to shortness of breath and productive cough that has been present for 4 days.  In the ED, she was found to have a new oxygen requirement of 3 L.  She was evaluated for PE which was negative but incidentally on CTA she was found to have a large right hilar mass with right pleural effusion consistent with malignancy.  Heme-onc was consulted to evaluate. She was started on IV antibiotics and IR was consulted for a thoracentesis with diagnostic labs. Additionally, patient was seen to be in A-fib with RVR.  She was overall hemodynamically stable and responded well to metoprolol.  Assessment and Plan: Acute hypoxic respiratory failure- Multifactorial as described below.  - Currently comfortable on 4L [place]   postobstructive pneumonia   large hilar mass   large Right pleural effusion- CTA on 2/14 showed large right hilar mass with near complete obstruction of bronchus intermedius, right middle lobe bronchus, right lower lobe bronchus, Mediastinal lymphadenopathy consistent with nodal metastases, Large right pleural effusion, Dense consolidation within the right middle and right lower lobes with associated volume loss. No evidence of pulmonary embolus. - continue CTX, azithromycin - IR consulted - US guided thoracentesis 2/15 - diagnostic labs pending which will help guide management decisions - lymph node biopsy 2/17 to help guide therapy decision - heme/onc consulted - CT abdomen pelvis showing additional liver lesion - therapy recommendations pending thoracentesis labs and lymph node biopsy - do NOT start on steroids - continue  scheduled duonebs - wean oxygen as tolerated - consulted pulmonology, appreciate recs  Simple cystitis- urinalysis positive for nitrites, RBCs, leuks and bacteria.  Patient denied urinary symptoms on admission. CTX began for PNA so will continue. Ur Cx NGTD. - continue CTX - follow up urinalysis as OP when resolved to ensure resolution of hematuria  New onset Afib with RVR- confirmed on ECG. Tachycardic intermittently. asymptomatic and otherwise hemodynamically stable. Responded well to metoprolol. Currently not a candidate for anticoagulation with planned procedures. HR low 100s this am.  - scheduled metoprolol - telemetry  HTN- chronic, stable - continue home amlodipine   CLL:  - heme/onc following, appreciate recs - holding home Imbruvica  Mild hypokalemia- resolved. K+4.4 -Replete as needed -BMP a.m.   Subjective: Patient reports no complaints today other than she would like to go home. Overall she feels she is doing well.  Physical Exam: Vitals:   09/01/21 0058 09/01/21 0103 09/01/21 0353 09/01/21 0354  BP:   115/78 94/65  Pulse:  99 (!) 122 99  Resp: 18  20   Temp:   97.7 F (36.5 C)   TempSrc:   Oral   SpO2:  91% 93%   Weight:      Height:       General: NAD, pleasant, able to participate in exam, frail. Cardiac: RRR, normal heart sounds, no murmurs. 2+ radial and PT pulses bilaterally Respiratory: CTAB, normal effort Extremities: no edema. WWP. Skin: warm and dry, no rashes noted on exposed skin Neuro: alert and oriented, no focal deficits Psych: Normal affect and mood  Data Reviewed: Urinalysis    Component Value Date/Time  COLORURINE AMBER (A) 08/29/2021 1405   APPEARANCEUR HAZY (A) 08/29/2021 1405   APPEARANCEUR CLOUDY 03/17/2013 0959   LABSPEC 1.024 08/29/2021 1405   LABSPEC 1.020 03/17/2013 0959   PHURINE 5.0 08/29/2021 1405   GLUCOSEU NEGATIVE 08/29/2021 1405   GLUCOSEU NEGATIVE 03/17/2013 0959   HGBUR MODERATE (A) 08/29/2021 1405    BILIRUBINUR NEGATIVE 08/29/2021 1405   BILIRUBINUR negative 10/14/2017 0907   BILIRUBINUR NEGATIVE 03/17/2013 0959   KETONESUR 5 (A) 08/29/2021 1405   PROTEINUR 100 (A) 08/29/2021 1405   UROBILINOGEN 0.2 10/14/2017 0907   NITRITE NEGATIVE 08/29/2021 1405   LEUKOCYTESUR LARGE (A) 08/29/2021 1405   LEUKOCYTESUR 2+ 03/17/2013 0959   BMP Latest Ref Rng & Units 08/31/2021 08/30/2021 08/29/2021  Glucose 70 - 99 mg/dL 101(H) 103(H) 105(H)  BUN 8 - 23 mg/dL 17 14 22   Creatinine 0.44 - 1.00 mg/dL 0.48 0.45 0.51  Sodium 135 - 145 mmol/L 136 136 137  Potassium 3.5 - 5.1 mmol/L 4.4 3.3(L) 3.6  Chloride 98 - 111 mmol/L 107 104 107  CO2 22 - 32 mmol/L 25 22 19(L)  Calcium 8.9 - 10.3 mg/dL 8.3(L) 8.0(L) 8.1(L)   CBC    Component Value Date/Time   WBC 24.3 (H) 08/30/2021 0511   RBC 4.54 08/30/2021 0511   HGB 10.5 (L) 08/30/2021 0511   HGB 14.1 10/21/2014 0914   HCT 35.7 (L) 08/30/2021 0511   HCT 43.8 10/21/2014 0914   PLT 195 08/30/2021 0511   PLT 175 08/08/2017 1052   MCV 78.6 (L) 08/30/2021 0511   MCV 93 10/21/2014 0914   MCH 23.1 (L) 08/30/2021 0511   MCHC 29.4 (L) 08/30/2021 0511   RDW 17.2 (H) 08/30/2021 0511   RDW 14.2 10/21/2014 0914   LYMPHSABS 29.2 (H) 08/29/2021 1405   LYMPHSABS 21.1 (H) 10/21/2014 0914   MONOABS 0.7 08/29/2021 1405   MONOABS 0.7 10/21/2014 0914   EOSABS 0.0 08/29/2021 1405   EOSABS 0.1 10/21/2014 0914   BASOSABS 0.1 08/29/2021 1405   BASOSABS 0.1 10/21/2014 0914   Family Communication: none  Disposition: Status is: Inpatient Remains inpatient appropriate because: Ongoing medical evaluation and treatment requiring inpatient level of care   Planned Discharge Destination:  TBD  enoxaparin (LOVENOX) injection 40 mg Start: 08/29/21 2200   Time spent: >60 minutes  Author: Richarda Osmond, MD 09/01/2021 7:12 AM  For on call review [email or web address].

## 2021-09-01 NOTE — Procedures (Signed)
Interventional Radiology Procedure Note  Date of Procedure: 09/01/2021  Procedure: [place] LN biopsy   Findings:  1. US guided right supraclavicular LN biopsy    Complications: No immediate complications noted.   Estimated Blood Loss: minimal  Follow-up and Recommendations: 1. None    Albin Felling, MD  Vascular & Interventional Radiology  09/01/2021 9:30 AM

## 2021-09-01 NOTE — Progress Notes (Signed)
Mobility Specialist - Progress Note    09/01/21 1500  Mobility  Activity Stood at bedside;Dangled on edge of bed  Range of Motion/Exercises Right leg;Left leg  Level of Assistance Modified independent, requires aide device or extra time  Assistive Device Standard walker  Distance Ambulated (ft) 0 ft  Activity Response Tolerated well  $Mobility charge 1 Mobility    Pre-mobility:112 HR, 86% SpO2 During mobility: 130-144 HR, 82-80% SpO2 Post-mobility: 120 HR, 84% SPO2  Pt supine using 2L upon arrival. Pt sat EOB + STS with ModI. Pt completed Therex BLE (O2/HR above) --- no ambulation d/t destat. Bumped to 5L d/t destat. Pt voiced mild SOB, but denied dizziness and chest pain. Pt returned to bed with alarm set and needs in reach. RN notified.  Kathee Delton Mobility Specialist 09/01/21, 3:49 PM

## 2021-09-01 NOTE — Progress Notes (Signed)
Maureen [name]   DOB:August 06, 1940   [id number]    Subjective: Patient continues to have shortness of breath.  No cough.  Patient s/p supra-clavicular biopsy this morning.  Objective:  Vitals:   09/01/21 2011 09/01/21 2103  BP:  108/72  Pulse:  (!) 110  Resp:  18  Temp:  (!) 97.4 F (36.3 C)  SpO2: 92% 94%     Intake/Output Summary (Last 24 hours) at 09/01/2021 2315 Last data filed at 09/01/2021 1902 Gross per 24 hour  Intake 340 ml  Output --  Net 340 ml    Physical Exam Vitals and nursing note reviewed.  Constitutional:      Comments: Patient on 2 L of oxygen.  Alone.  HENT:     Head: Normocephalic and atraumatic.     Mouth/Throat:     Pharynx: Oropharynx is clear.  Eyes:     Extraocular Movements: Extraocular movements intact.     Pupils: Pupils are equal, round, and reactive to light.  Cardiovascular:     Rate and Rhythm: Normal rate and regular rhythm.  Pulmonary:     Comments: Decreased breath sounds bilaterally-right more than left. Abdominal:     Palpations: Abdomen is soft.  Musculoskeletal:        General: Normal range of motion.     Cervical back: Normal range of motion.  Skin:    General: Skin is warm.  Neurological:     General: No focal deficit present.     Mental Status: She is alert and oriented to person, place, and time.  Psychiatric:        Behavior: Behavior normal.        Judgment: Judgment normal.     Labs:  Lab Results  Component Value Date   WBC 24.3 (H) 08/30/2021   HGB 10.5 (L) 08/30/2021   HCT 35.7 (L) 08/30/2021   MCV 78.6 (L) 08/30/2021   PLT 195 08/30/2021   NEUTROABS 6.2 08/29/2021    Lab Results  Component Value Date   NA 136 08/31/2021   K 4.4 08/31/2021   CL 107 08/31/2021   CO2 25 08/31/2021    Studies:  [place] CORE BIOPSY (LYMPH NODES)  Result Date: 09/01/2021 INDICATION: Lymphadenopathy EXAM: Ultrasound-guided core needle biopsy of right supraclavicular lymph node MEDICATIONS: None. ANESTHESIA/SEDATION: Local  analgesia FLUOROSCOPY: N/a COMPLICATIONS: None immediate. PROCEDURE: Informed written consent was obtained from the patient after a thorough discussion of the procedural risks, benefits and alternatives. All questions were addressed. Maximal Sterile Barrier Technique was utilized including caps, mask, sterile gowns, sterile gloves, sterile drape, hand hygiene and skin antiseptic. A timeout was performed prior to the initiation of the procedure. The patient was placed supine on the exam table. Limited ultrasound exam of the right neck and supraclavicular region demonstrated a couple of enlarged abnormal lymph nodes. Appropriate right supraclavicular lymph node was selected as biopsy target. Skin entry site was marked, the overlying skin was prepped and draped in the standard sterile fashion. Local analgesia was obtained with 1% lidocaine. Under ultrasound guidance, core needle biopsy of the target right supraclavicular lymph node was performed using an 18 gauge core biopsy device x6 passes. Specimens were submitted in saline to pathology for further handling. Limited postprocedure imaging demonstrated no hematoma or other complicating feature. A clean dressing was placed after manual hemostasis. The patient tolerated the procedure well without immediate complication. IMPRESSION: Successful ultrasound-guided core needle biopsy of enlarged right supraclavicular lymph node. Electronically Signed   By: Scherrie Bateman.D.  On: 09/01/2021 09:58    Acute respiratory failure with hypoxia (HCC) Multifactorial as described below.  Currently comfortable on 4L     Mass of upper lobe of right lung #81 year old female patient with a history of CLL; and longstanding smoking-is currently admitted to hospital for worsening shortness of breath.  CT scan showed large right hilar mass/postoperative obstructive atelectasis; mediastinal adenopathy.   # Large right-sided hilar mass; postobstructive atelectasis; mediastinal  adenopathy in the context of longstanding history of smoking-is concerning for primary lung malignancy vs. Richter's transformation.  CT abdomen pelvis-suggestive of liver lesion concerning for metastases. 2/15- Thoracentesis-flow cytometry positive for atypical lymphocytes.  S/p supraclavicular node biopsy this morning pending biopsy.  #CLL-currently ibrutinib on HOLD. White count around 20-30 overall stable.  Mild anemia hemoglobin 10-?  Etiology.  Suspect anemia of chronic disease.  Patient can potentially be discharged in the hospital once deemed clinically stable.  Patient will follow up with Dr. Janese Banks as outpatient.     Cammie Sickle, MD 09/01/2021  11:15 PM

## 2021-09-02 LAB — BODY FLUID CULTURE W GRAM STAIN
Culture: NO GROWTH
Gram Stain: NONE SEEN

## 2021-09-02 LAB — CBC
HCT: 34.6 % — ABNORMAL LOW (ref 36.0–46.0)
Hemoglobin: 10.4 g/dL — ABNORMAL LOW (ref 12.0–15.0)
MCH: 23.8 pg — ABNORMAL LOW (ref 26.0–34.0)
MCHC: 30.1 g/dL (ref 30.0–36.0)
MCV: 79.2 fL — ABNORMAL LOW (ref 80.0–100.0)
Platelets: 209 10*3/uL (ref 150–400)
RBC: 4.37 MIL/uL (ref 3.87–5.11)
RDW: 17.2 % — ABNORMAL HIGH (ref 11.5–15.5)
WBC: 28.3 10*3/uL — ABNORMAL HIGH (ref 4.0–10.5)
nRBC: 0 % (ref 0.0–0.2)

## 2021-09-02 MED ORDER — METOPROLOL TARTRATE 50 MG PO TABS: 50.0000 mg | ORAL_TABLET | Freq: Two times a day (BID) | ORAL | 0 refills | Status: DC

## 2021-09-02 MED ORDER — METOPROLOL TARTRATE 50 MG PO TABS
50.0000 mg | ORAL_TABLET | Freq: Once | ORAL | Status: AC
  Administered 2021-09-02: 17:00:00 50 mg via ORAL
  Filled 2021-09-02: qty 1

## 2021-09-02 MED ORDER — METOPROLOL TARTRATE 50 MG PO TABS
50.0000 mg | ORAL_TABLET | Freq: Two times a day (BID) | ORAL | Status: DC
  Administered 2021-09-02: 50 mg via ORAL
  Filled 2021-09-02: qty 1

## 2021-09-02 MED ORDER — AMOXICILLIN-POT CLAVULANATE 875-125 MG PO TABS: 1.0000 | ORAL_TABLET | Freq: Two times a day (BID) | ORAL | 0 refills | Status: AC

## 2021-09-02 MED ORDER — METOPROLOL TARTRATE 100 MG PO TABS
100.0000 mg | ORAL_TABLET | Freq: Two times a day (BID) | ORAL | 0 refills | Status: AC
Start: 2021-09-02 — End: 2021-11-01

## 2021-09-02 MED ORDER — METOPROLOL TARTRATE 50 MG PO TABS: 100.0000 mg | ORAL_TABLET | Freq: Two times a day (BID) | ORAL | Status: DC

## 2021-09-02 NOTE — Progress Notes (Signed)
Mobility Specialist - Progress Note    09/02/21 1135  Mobility  Activity Refused mobility    Pt supine utilizing 4L upon arrival. Pt refused session d/t recent O2 test with RN, and SOB. Pt planning to discharge today but will attempt session at later time.  Merrily Brittle Mobility Specialist 09/02/21, 11:37 AM

## 2021-09-02 NOTE — Progress Notes (Signed)
SATURATION QUALIFICATIONS: (This note is used to comply with regulatory documentation for home oxygen)  Patient Saturations on Room Air at Rest = 90%  Patient Saturations on Room Air while Ambulating = 82%  Patient Saturations on 2 Liters of oxygen while Ambulating around unit  = 82% with patient become dizzy at times with some shortness of breath.  Patient recovered to 89% on 4L while ambulating, then later after rest in bed patient recovered to 91% on 2 liters of o2 .    Please briefly explain why patient needs home oxygen:

## 2021-09-02 NOTE — Discharge Summary (Signed)
Physician Discharge Summary  Patient: Maureen [name] VOH:[id number] DOB: 24-Aug-1940   Code Status: Full Code Admit date: 08/29/2021 Discharge date: 09/02/2021 Disposition: Home, PT, OT, nurse aid, and RN. Home oxygen 2-4L continuous PCP: Juline Patch, MD  Recommendations for Outpatient Follow-up:  Follow up with PCP within 1-2 weeks Monitor heart rate- intermittently in afib RVR up to 140 but well controlled on metoprolol. Asymptomatic. Consider cardiology referral. Follow up with heme/onc within the week Discuss lymph node biopsy results, thoracentesis results and treatment plan Follow up with pulmonology Sent home on 2-4L [place] O2 and augmentin ppx  Discharge Diagnoses:  Principal Problem:   Acute respiratory failure with hypoxia ([place]) Active Problems:   Hilar mass   Acute midline low back pain without sciatica   Pleural effusion   SOB (shortness of breath)   Hx of primary hypertension   Mass of upper lobe of right lung   Malnutrition of moderate degree   S/P thoracentesis   SIRS (systemic inflammatory response syndrome) [facility])  Brief Hospital Course Summary: 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  She presented to the ED via EMS from the [place] urgent care on 2/14 due to shortness of breath and productive cough that has been present for 4 days.  In the ED, she was found to have a new oxygen requirement of 3 L.  She was evaluated for PE which was negative but incidentally on CTA she was found to have a large right hilar mass with right pleural effusion consistent with malignancy.  Heme-onc was consulted to evaluate. She was started on IV antibiotics and IR was consulted for a thoracentesis with diagnostic labs. She underwent lymph node biopsy to help guide treatment decisions but results were pending at time of discharge.  She was discharged with 5L O2 and instructions to follow up  outpatient with heme/onc.  Additionally, patient was seen to be in A-fib with RVR likely related to hilar mass. Medications were adjusted over several days to try to get control of HR. She was overall hemodynamically stable and responded well to metoprolol.  Discharge Condition: Good, improved Recommended discharge diet: Regular healthy diet  Consultations: Heme/onc Pulmonology IR  Procedures/Studies: Lymph node biopsy Thoracentesis    Allergies as of 09/02/2021   No Known Allergies      Medication List     STOP taking these medications    Imbruvica 420 MG tablet Generic drug: ibrutinib       TAKE these medications    alendronate 70 MG tablet Commonly known as: FOSAMAX Take 1 tablet (70 mg total) by mouth every 7 (seven) days. Take with a full glass of water on an empty stomach.   amLODipine 2.5 MG tablet Commonly known as: NORVASC Take 1 tablet (2.5 mg total) by mouth daily.   amoxicillin-clavulanate 875-125 MG tablet Commonly known as: Augmentin Take 1 tablet by mouth 2 (two) times daily for 7 days.   Calcium 1200 1200-1000 MG-UNIT Chew Chew 1 tablet by mouth daily.   folic acid 694 MCG tablet Commonly known as: FOLVITE Take 800 mcg by mouth 3 (three) times a week.   metoprolol tartrate 100 MG tablet Commonly known as: LOPRESSOR Take 1 tablet (100 mg total) by mouth 2 (two) times daily.   vitamin B-12 1000 MCG tablet Commonly known as: CYANOCOBALAMIN Take 1,000 mcg by mouth. Takes three times a week   Vitamin D3 20 MCG (  800 UNIT) Tabs Take 1 tablet by mouth daily.               Durable Medical Equipment  (From admission, onward)           Start     Ordered   09/02/21 1615  For home use only DME oxygen  Once       Comments: 2-5L  Question Answer Comment  Length of Need 12 Months   Mode or (Route) Nasal cannula   Liters per Minute 5   Frequency Continuous (stationary and portable oxygen unit needed)   Oxygen conserving device Yes    Oxygen delivery system Gas      09/02/21 1615             Subjective   Pt reports feeling overall well. She understands seriousness of her condition and states that she does not want to pursue aggressive care.   Objective  Blood pressure 105/66, pulse (!) 121, temperature 97.7 F (36.5 C), temperature source Oral, resp. rate 18, height 5\' 5"  (1.651 m), weight 56.2 kg, SpO2 94 %.   General: Pt is alert, awake, not in acute distress Cardiovascular: RRR, S1/S2 +, no rubs, no gallops Respiratory: CTA bilaterally, no wheezing, no rhonchi Abdominal: Soft, NT, ND, bowel sounds + Extremities: no edema, no cyanosis   The results of significant diagnostics from this hospitalization (including imaging, microbiology, ancillary and laboratory) are listed below for reference.   Imaging studies: DG Lumbar Spine Complete  Result Date: 08/29/2021 CLINICAL DATA:  Back pain. EXAM: LUMBAR SPINE - COMPLETE 4+ VIEW COMPARISON:  None. FINDINGS: Mild lumbar scoliosis. Mild retrolisthesis L2-3. Otherwise normal alignment. Negative for fracture or mass. Atherosclerotic calcification abdominal aorta. Disc degeneration throughout the lumbar spine with disc space narrowing and spurring IMPRESSION: Multilevel lumbar spondylosis with scoliosis.  No acute abnormality. Electronically Signed   By: Franchot Gallo M.D.   On: 08/29/2021 13:46   CT Angio Chest PE W and/or Wo Contrast  Result Date: 08/29/2021 CLINICAL DATA:  Productive cough, shortness of breath EXAM: CT ANGIOGRAPHY CHEST WITH CONTRAST TECHNIQUE: Multidetector CT imaging of the chest was performed using the standard protocol during bolus administration of intravenous contrast. Multiplanar CT image reconstructions and MIPs were obtained to evaluate the vascular anatomy. RADIATION DOSE REDUCTION: This exam was performed according to the departmental dose-optimization program which includes automated exposure control, adjustment of the mA and/or kV  according to patient size and/or use of iterative reconstruction technique. CONTRAST:  54mL OMNIPAQUE IOHEXOL 350 MG/ML SOLN COMPARISON:  09/01/2008 FINDINGS: Cardiovascular: This is a technically adequate evaluation of the pulmonary vasculature. No filling defects or pulmonary emboli. The heart is unremarkable without pericardial effusion. Normal caliber of the thoracic aorta. Mild atherosclerosis of the aortic arch and coronary vasculature. Mediastinum/Nodes: Large soft tissue mass at the right hilum measures up to 3.7 x 3.6 cm, with near complete obstruction of the bronchus intermedius. Mediastinal lymphadenopathy is also identified, measuring 1.8 cm in the precarinal station and up to 3 cm in the subcarinal region. Thyroid, trachea, and esophagus are otherwise unremarkable. Lungs/Pleura: Large right pleural effusion. The right hilar mass described above results in near complete obstruction of the bronchus intermedius as well as the right middle and right lower lobe bronchi, with dense right middle and right lower lobe consolidation consistent with postobstructive change. Background emphysema. No pneumothorax. The left chest is clear. Upper Abdomen: No acute abnormality. Musculoskeletal: No acute or destructive bony lesions. Reconstructed images demonstrate no additional findings. Review  of the MIP images confirms the above findings. IMPRESSION: 1. Large right hilar mass, with near complete obstruction of the bronchus intermedius, right middle lobe bronchus, and right lower lobe bronchus, consistent with malignancy. 2. Mediastinal lymphadenopathy consistent with nodal metastases. 3. Large right pleural effusion. 4. Dense consolidation within the right middle and right lower lobes with associated volume loss. Findings are compatible with postobstructive change. 5. No evidence of pulmonary embolus. 6.  Aortic Atherosclerosis (ICD10-I70.0). Electronically Signed   By: Randa Ngo M.D.   On: 08/29/2021 16:21    CT ABDOMEN PELVIS W CONTRAST  Result Date: 08/30/2021 CLINICAL DATA:  Abdominal pain. EXAM: CT ABDOMEN AND PELVIS WITH CONTRAST TECHNIQUE: Multidetector CT imaging of the abdomen and pelvis was performed using the standard protocol following bolus administration of intravenous contrast. RADIATION DOSE REDUCTION: This exam was performed according to the departmental dose-optimization program which includes automated exposure control, adjustment of the mA and/or kV according to patient size and/or use of iterative reconstruction technique. CONTRAST:  15mL OMNIPAQUE IOHEXOL 300 MG/ML  SOLN COMPARISON:  02/13/2018 FINDINGS: Lower chest: As on the recent CT of the chest from 08/29/2018. There is right hilar and subcarinal adenopathy. Interval decrease in volume of the right pleural effusion status post thoracentesis. Extensive postobstructive changes are identified within the right middle lobe and right lower lobe. Small left pleural effusion identified. Hepatobiliary: Ill-defined low-density lesion within the inferior right hepatic lobe measures 1.8 by 1.5 cm, image 42/2. This is a new finding compared with 02/13/2018 and is suspicious for liver metastases. Gallbladder appears partially decompressed. No signs of gallbladder wall inflammation or bile duct dilatation. Pancreas: Unremarkable. No pancreatic ductal dilatation or surrounding inflammatory changes. Spleen: Borderline enlarged spleen measures 11.3 cm in length. Calcified granulomas identified throughout the splenic parenchyma. Adrenals/Urinary Tract: Normal adrenal glands. Multiple small areas of cortical scarring noted within the right kidney. Left kidney appears normal. No signs of mass or hydronephrosis. The urinary bladder is unremarkable. Excreted contrast material is noted within the bladder lumen. Stomach/Bowel: Stomach appears nondistended. The appendix is visualized and appears within normal limits, image 45/2. No bowel wall thickening,  inflammation, or distension. Vascular/Lymphatic: Aortic atherosclerosis. Infrarenal abdominal aortic aneurysm measures 3.6 cm, image 47/2. This is compared with 2.9 cm previously. No abdominopelvic adenopathy. Reproductive: The uterus and adnexal structures are unremarkable. Prominent bilateral parametrial veins identified which may reflect underlying pelvic venous congestion syndrome. Other: There is no free fluid or fluid collections. No signs of pneumoperitoneum. Musculoskeletal: The bones appear diffusely osteopenic. Multilevel degenerative disc disease is identified throughout the visualized thoracic and lumbar spine. No acute or suspicious osseous findings. IMPRESSION: 1. No acute findings identified within the abdomen or pelvis. 2. There is a new low-density structure within the inferior right hepatic lobe worrisome for liver metastases. 3. Decrease in volume of right pleural effusion status post thoracentesis. 4. Infrarenal abdominal aortic aneurysm measuring 3.6 cm. Recommend follow-up every 2 years. Reference: J Am Coll Radiol 8101;75:102-585. Aortic aneurysm NOS (ICD10-I71.9). 5. Prominent bilateral parametrial veins which may reflect underlying pelvic venous congestion syndrome. 6. Aortic Atherosclerosis (ICD10-I70.0). Electronically Signed   By: Kerby Moors M.D.   On: 08/30/2021 12:17   DG Chest Port 1 View  Result Date: 08/30/2021 CLINICAL DATA:  Status post right thoracentesis. EXAM: PORTABLE CHEST 1 VIEW COMPARISON:  August 29, 2021. FINDINGS: Decreased right pleural effusion, now small. Also, small left pleural effusion. Improved right mid and lower lung opacities. No visible pneumothorax. Right hilar mass better characterized on recent CT  chest. Similar cardiac silhouette. IMPRESSION: 1. Decreased right pleural effusion, now small. No visible pneumothorax. 2. Improved right mid and lower lung opacities. 3. Right hilar mass better characterized on recent CT chest. Electronically Signed    By: Margaretha Sheffield M.D.   On: 08/30/2021 10:13   DG Chest Portable 1 View  Result Date: 08/29/2021 CLINICAL DATA:  Chest pain short of breath EXAM: PORTABLE CHEST 1 VIEW COMPARISON:  05/07/2007.  Chest CT 09/26/2021 FINDINGS: Large area of increased density in the right base. This is due to a combination of pleural effusion and airspace disease. Left lung is clear. Heart seems mild scratch the heart size mildly enlarged. Vascularity normal. IMPRESSION: Extensive airspace density in the right lung base most consistent with effusion and airspace consolidation. Suspect underlying right lower lobe mass lesion as identified on CT chest earlier today. Electronically Signed   By: Franchot Gallo M.D.   On: 08/29/2021 17:07   ECHOCARDIOGRAM COMPLETE  Result Date: 08/31/2021    ECHOCARDIOGRAM REPORT   Patient Name:   NIKIE CID Harshman Date of Exam: 08/30/2021 Medical Rec #:  [id number]       Height:       65.0 in Accession #:    [id number]      Weight:       124.0 lb Date of Birth:  07-02-1941      BSA:          1.614 m Patient Age:    27 years        BP:           122/82 mmHg Patient Gender: F               HR:           106 bpm. Exam Location:  [facility] Procedure: 2D Echo, Cardiac Doppler and Color Doppler Indications:     Atrial Fibrillation I48.91  History:         Patient has no prior history of Echocardiogram examinations.                  Anemia.  Sonographer:     Sherrie Sport Referring Phys:  [place] Diagnosing Phys: Serafina Royals MD  Sonographer Comments: Suboptimal apical window. IMPRESSIONS  1. Left ventricular ejection fraction, by estimation, is 60 to 65%. The left ventricle has normal function. The left ventricle has no regional wall motion abnormalities. Left ventricular diastolic function could not be evaluated.  2. Right ventricular systolic function is normal. The right ventricular size is normal.  3. Left atrial size was moderately dilated.  4. Right atrial size was moderately dilated.  5.  The mitral valve is normal in structure. Mild mitral valve regurgitation.  6. The aortic valve is normal in structure. Aortic valve regurgitation is trivial. FINDINGS  Left Ventricle: Left ventricular ejection fraction, by estimation, is 60 to 65%. The left ventricle has normal function. The left ventricle has no regional wall motion abnormalities. The left ventricular internal cavity size was normal in size. There is  no left ventricular hypertrophy. Left ventricular diastolic function could not be evaluated. Right Ventricle: The right ventricular size is normal. No increase in right ventricular wall thickness. Right ventricular systolic function is normal. Left Atrium: Left atrial size was moderately dilated. Right Atrium: Right atrial size was moderately dilated. Pericardium: There is no evidence of pericardial effusion. Mitral Valve: The mitral valve is normal in structure. Mild mitral valve regurgitation. Tricuspid Valve: The tricuspid valve is normal  in structure. Tricuspid valve regurgitation is mild. Aortic Valve: The aortic valve is normal in structure. Aortic valve regurgitation is trivial. Aortic valve mean gradient measures 3.0 mmHg. Aortic valve peak gradient measures 4.5 mmHg. Aortic valve area, by VTI measures 2.47 cm. Pulmonic Valve: The pulmonic valve was normal in structure. Pulmonic valve regurgitation is not visualized. Aorta: The aortic root and ascending aorta are structurally normal, with no evidence of dilitation. IAS/Shunts: No atrial level shunt detected by color flow Doppler.  LEFT VENTRICLE PLAX 2D LVIDd:         3.60 cm LVIDs:         2.60 cm LV PW:         1.20 cm LV IVS:        0.95 cm LVOT diam:     2.00 cm LV SV:         35 LV SV Index:   21 LVOT Area:     3.14 cm  RIGHT VENTRICLE RV Basal diam:  2.60 cm RV S prime:     17.10 cm/s TAPSE (M-mode): 2.1 cm LEFT ATRIUM             Index        RIGHT ATRIUM           Index LA diam:        3.50 cm 2.17 cm/m   RA Area:     18.10 cm LA  Vol (A2C):   40.3 ml 24.96 ml/m  RA Volume:   54.90 ml  34.01 ml/m LA Vol (A4C):   39.9 ml 24.71 ml/m LA Biplane Vol: 40.0 ml 24.78 ml/m  AORTIC VALVE                    PULMONIC VALVE AV Area (Vmax):    2.26 cm     PV Vmax:        0.64 m/s AV Area (Vmean):   1.88 cm     PV Vmean:       42.000 cm/s AV Area (VTI):     2.47 cm     PV VTI:         0.077 m AV Vmax:           106.00 cm/s  PV Peak grad:   1.6 mmHg AV Vmean:          73.800 cm/s  PV Mean grad:   1.0 mmHg AV VTI:            0.140 m      RVOT Peak grad: 2 mmHg AV Peak Grad:      4.5 mmHg AV Mean Grad:      3.0 mmHg LVOT Vmax:         76.40 cm/s LVOT Vmean:        44.200 cm/s LVOT VTI:          0.110 m LVOT/AV VTI ratio: 0.79  AORTA Ao Root diam: 3.67 cm MITRAL VALVE               TRICUSPID VALVE MV Area (PHT): 5.06 cm    TR Peak grad:   34.1 mmHg MV Decel Time: 150 msec    TR Vmax:        292.00 cm/s MV E velocity: 85.70 cm/s                            SHUNTS  Systemic VTI:  0.11 m                            Systemic Diam: 2.00 cm                            Pulmonic VTI:  0.080 m Serafina Royals MD Electronically signed by Serafina Royals MD Signature Date/Time: 08/31/2021/12:12:10 PM    Final    [place] CORE BIOPSY (LYMPH NODES)  Result Date: 09/01/2021 INDICATION: Lymphadenopathy EXAM: Ultrasound-guided core needle biopsy of right supraclavicular lymph node MEDICATIONS: None. ANESTHESIA/SEDATION: Local analgesia FLUOROSCOPY: N/a COMPLICATIONS: None immediate. PROCEDURE: Informed written consent was obtained from the patient after a thorough discussion of the procedural risks, benefits and alternatives. All questions were addressed. Maximal Sterile Barrier Technique was utilized including caps, mask, sterile gowns, sterile gloves, sterile drape, hand hygiene and skin antiseptic. A timeout was performed prior to the initiation of the procedure. The patient was placed supine on the exam table. Limited ultrasound exam of the right  neck and supraclavicular region demonstrated a couple of enlarged abnormal lymph nodes. Appropriate right supraclavicular lymph node was selected as biopsy target. Skin entry site was marked, the overlying skin was prepped and draped in the standard sterile fashion. Local analgesia was obtained with 1% lidocaine. Under ultrasound guidance, core needle biopsy of the target right supraclavicular lymph node was performed using an 18 gauge core biopsy device x6 passes. Specimens were submitted in saline to pathology for further handling. Limited postprocedure imaging demonstrated no hematoma or other complicating feature. A clean dressing was placed after manual hemostasis. The patient tolerated the procedure well without immediate complication. IMPRESSION: Successful ultrasound-guided core needle biopsy of enlarged right supraclavicular lymph node. Electronically Signed   By: Albin Felling M.D.   On: 09/01/2021 09:58   US THORACENTESIS ASP PLEURAL SPACE W/IMG GUIDE  Result Date: 08/30/2021 INDICATION: Patient with a history of leukemia and new right hilar mass presents today with a right pleural effusion. Interventional radiology asked to perform a diagnostic and therapeutic thoracentesis. EXAM: ULTRASOUND GUIDED THORACENTESIS MEDICATIONS: 1% lidocaine 10 mL COMPLICATIONS: None immediate. PROCEDURE: An ultrasound guided thoracentesis was thoroughly discussed with the patient and questions answered. The benefits, risks, alternatives and complications were also discussed. The patient understands and wishes to proceed with the procedure. Written consent was obtained. Ultrasound was performed to localize and mark an adequate pocket of fluid in the right chest. The area was then prepped and draped in the normal sterile fashion. 1% Lidocaine was used for local anesthesia. Under ultrasound guidance a 6 Fr Safe-T-Centesis catheter was introduced. Thoracentesis was performed. The catheter was removed and a dressing  applied. FINDINGS: A total of approximately 1950 mL of light red fluid was removed. Samples were sent to the laboratory as requested by the clinical team. IMPRESSION: Successful ultrasound guided right thoracentesis yielding 1950 mL of pleural fluid. Read by: Soyla Dryer, NP Electronically Signed   By: Albin Felling M.D.   On: 08/30/2021 11:22    Labs: Basic Metabolic Panel: Recent Labs  Lab 08/29/21 1405 08/30/21 0511 08/31/21 0443  NA 137 136 136  K 3.6 3.3* 4.4  CL 107 104 107  CO2 19* 22 25  GLUCOSE 105* 103* 101*  BUN 22 14 17   CREATININE 0.51 0.45 0.48  CALCIUM 8.1* 8.0* 8.3*  MG 1.8  --   --    CBC: Recent Labs  Lab  08/29/21 1405 08/30/21 0511 09/02/21 0721  WBC 36.3* 24.3* 28.3*  NEUTROABS 6.2  --   --   HGB 11.0* 10.5* 10.4*  HCT 37.5 35.7* 34.6*  MCV 79.6* 78.6* 79.2*  PLT 231 195 209   Microbiology: pending  Time coordinating discharge: Over 30 minutes  Richarda Osmond, MD  [facility] 09/02/2021, 5:48 PM

## 2021-09-02 NOTE — Discharge Instructions (Signed)
Please continue to wear oxygen at home. At least 2 liters and can increase as needed if feeling short of breath or if you have lower oxygen saturation on a monitor (you can get one from the pharmacy). I have continued an antibiotic for another week to help prevent infection which is common when there is a blockage in your lungs.  Please set up an appointment to follow up with Dr. Janese Banks this upcoming week to discuss your biopsy results and help formulate a plan going forward for treatment.   You developed a fast heart rate while hospitalized which may have been there before you came or can be a reaction to the mass in your lungs. Please continue to take your new medication of metoprolol 50mg  twice per day and follow up with your primary doctor within the next week or so as well.  Please return to the emergency department if you are experiencing shortness of breath not resolved with resting, continuously needing increased oxygen level or if you feel chest pain or racing heart.

## 2021-09-02 NOTE — Progress Notes (Addendum)
Mobility Specialist - Progress Note    09/02/21 1400  Mobility  Activity Stood at bedside;Ambulated with assistance in hallway;Dangled on edge of bed  Level of Assistance Standby assist, set-up cues, supervision of patient - no hands on  Assistive Device Standard walker  Distance Ambulated (ft) 200 ft  Activity Response Tolerated well  $Mobility charge 1 Mobility   SpO2 at rest on 2L: 89% SpO2 ambulating on 5L: 92%, 111 HR SpO2 at rest on 5L: 94% SpO2 post ambulation at rest on 2L: 90%  Pt supine on 2L upon arrival with family at bedside. Pt sat EOB + STS Independently. Pt ambulated 272ft on 5L with O2 stats above ---- voiced mild SOB, denied dizziness. Pt returned to bed on 2L. Pt left supine with bed alarm set, needs in reach and family at bedside.   Merrily Brittle Mobility Specialist 09/02/21, 2:32 PM

## 2021-09-02 NOTE — TOC Transition Note (Addendum)
Transition of Care [facility]) - CM/SW Discharge Note   Patient Details  Name: Maureen [name] MRN: [id number] Date of Birth: 03-15-1941  Transition of [facility]) CM/SW Contact:  Magnus Ivan, LCSW Phone Number: 09/02/2021, 9:18 AM   Clinical Narrative:    Per MD, patient to DC home today, needs home o2. Asked for [place] orders. Notified Jason with [facility] of DC. Ordered home o2 through [place] with [facility]. Delana Meyer is aware patient is DC today and o2 needs to be delivered to bedside prior to DC. No additional TOC needs identified.  9:34- Per Jasmine with [facility], they can't use [facility]'s note for o2 sats.  Need separate sat note.  Asked RN to complete this.  4:20- Per RN patient will need to be on 5L. MD updated orders. Notified Jasmine with [employer].  Per [place], [facility] will reach out to patient and complete home set up this evening.    Final next level of care: [place] Barriers to Discharge: Barriers Resolved   Patient Goals and CMS Choice Patient states their goals for this hospitalization and ongoing recovery are:: home with [facility]   Choice offered to / list presented to : NA  Discharge Placement                       Discharge Plan and Services   Discharge Planning Services: CM Consult Post Acute Care Choice: Home Health          DME Arranged: Oxygen DME Agency: [facility] Date DME Agency Contacted: 09/02/21   Representative spoke with at DME Agency: [place]: [place] ([place]) Date [place]: 09/02/21   Representative spoke with at [place]: [place] ([place]) Interventions     Readmission Risk Interventions Readmission Risk Prevention Plan 08/30/2021  Transportation Screening Complete  PCP or Specialist Appt within 5-7 Days Complete  Home Care Screening Complete  Medication Review (RN CM) Complete  Some recent data might be hidden

## 2021-09-02 NOTE — Progress Notes (Incomplete)
Progress Note   Patient: Maureen SORRELS YQM:[id number] DOB: [birth year] DOA: 08/29/2021     4 DOS: the patient was seen and examined on 09/02/2021   Brief hospital course: 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  She presented to the ED via EMS from the [place] urgent care on 2/14 due to shortness of breath and productive cough that has been present for 4 days.  In the ED, she was found to have a new oxygen requirement of 3 L.  She was evaluated for PE which was negative but incidentally on CTA she was found to have a large right hilar mass with right pleural effusion consistent with malignancy.  Heme-onc was consulted to evaluate. She was started on IV antibiotics and IR was consulted for a thoracentesis with diagnostic labs. Additionally, patient was seen to be in A-fib with RVR.  She was overall hemodynamically stable and responded well to metoprolol.  Assessment and Plan: Acute hypoxic respiratory failure- Multifactorial as described below.  - Currently comfortable on 4L [place]   postobstructive pneumonia   large hilar mass   large Right pleural effusion- CTA on 2/14 showed large right hilar mass with near complete obstruction of bronchus intermedius, right middle lobe bronchus, right lower lobe bronchus, Mediastinal lymphadenopathy consistent with nodal metastases, Large right pleural effusion, Dense consolidation within the right middle and right lower lobes with associated volume loss. No evidence of pulmonary embolus. - continue CTX, azithromycin - IR consulted - US guided thoracentesis 2/15 - diagnostic labs pending which will help guide management decisions - lymph node biopsy 2/17 to help guide therapy decision - heme/onc consulted - CT abdomen pelvis showing additional liver lesion - therapy recommendations pending thoracentesis labs and lymph node biopsy - do NOT start on steroids - continue  scheduled duonebs - wean oxygen as tolerated - consulted pulmonology, appreciate recs  Simple cystitis- urinalysis positive for nitrites, RBCs, leuks and bacteria.  Patient denied urinary symptoms on admission. CTX began for PNA so will continue. Ur Cx NGTD. - continue CTX - follow up urinalysis as OP when resolved to ensure resolution of hematuria  New onset Afib with RVR- confirmed on ECG. Tachycardic intermittently. asymptomatic and otherwise hemodynamically stable. Responded well to metoprolol. Currently not a candidate for anticoagulation with planned procedures. HR low 100s this am.  - scheduled metoprolol - telemetry  HTN- chronic, stable - continue home amlodipine   CLL:  - heme/onc following, appreciate recs - holding home Imbruvica  Mild hypokalemia- resolved. K+4.4 -Replete as needed -BMP a.m.   Subjective: Patient reports no complaints today other than she would like to go home. Overall she feels she is doing well.  Physical Exam: Vitals:   09/02/21 0147 09/02/21 0151 09/02/21 0152 09/02/21 0527  BP:  105/72  100/83  Pulse: (!) 101 (!) 113 (!) 107 70  Resp:      Temp:  97.8 F (36.6 C)  97.9 F (36.6 C)  TempSrc:  Oral  Oral  SpO2: 92% 93% 93% 93%  Weight:      Height:       General: NAD, pleasant, able to participate in exam, frail. Cardiac: RRR, normal heart sounds, no murmurs. 2+ radial and PT pulses bilaterally Respiratory: CTAB, normal effort Extremities: no edema. WWP. Skin: warm and dry, no rashes noted on exposed skin Neuro: alert and oriented, no focal deficits Psych: Normal affect and mood  Data Reviewed: Urinalysis  Component Value Date/Time   COLORURINE AMBER (A) 08/29/2021 1405   APPEARANCEUR HAZY (A) 08/29/2021 1405   APPEARANCEUR CLOUDY 03/17/2013 0959   LABSPEC 1.024 08/29/2021 1405   LABSPEC 1.020 03/17/2013 0959   PHURINE 5.0 08/29/2021 1405   GLUCOSEU NEGATIVE 08/29/2021 1405   GLUCOSEU NEGATIVE 03/17/2013 0959   HGBUR  MODERATE (A) 08/29/2021 1405   BILIRUBINUR NEGATIVE 08/29/2021 1405   BILIRUBINUR negative 10/14/2017 0907   BILIRUBINUR NEGATIVE 03/17/2013 0959   KETONESUR 5 (A) 08/29/2021 1405   PROTEINUR 100 (A) 08/29/2021 1405   UROBILINOGEN 0.2 10/14/2017 0907   NITRITE NEGATIVE 08/29/2021 1405   LEUKOCYTESUR LARGE (A) 08/29/2021 1405   LEUKOCYTESUR 2+ 03/17/2013 0959   BMP Latest Ref Rng & Units 08/31/2021 08/30/2021 08/29/2021  Glucose 70 - 99 mg/dL 101(H) 103(H) 105(H)  BUN 8 - 23 mg/dL 17 14 22   Creatinine 0.44 - 1.00 mg/dL 0.48 0.45 0.51  Sodium 135 - 145 mmol/L 136 136 137  Potassium 3.5 - 5.1 mmol/L 4.4 3.3(L) 3.6  Chloride 98 - 111 mmol/L 107 104 107  CO2 22 - 32 mmol/L 25 22 19(L)  Calcium 8.9 - 10.3 mg/dL 8.3(L) 8.0(L) 8.1(L)   CBC    Component Value Date/Time   WBC 24.3 (H) 08/30/2021 0511   RBC 4.54 08/30/2021 0511   HGB 10.5 (L) 08/30/2021 0511   HGB 14.1 10/21/2014 0914   HCT 35.7 (L) 08/30/2021 0511   HCT 43.8 10/21/2014 0914   PLT 195 08/30/2021 0511   PLT 175 08/08/2017 1052   MCV 78.6 (L) 08/30/2021 0511   MCV 93 10/21/2014 0914   MCH 23.1 (L) 08/30/2021 0511   MCHC 29.4 (L) 08/30/2021 0511   RDW 17.2 (H) 08/30/2021 0511   RDW 14.2 10/21/2014 0914   LYMPHSABS 29.2 (H) 08/29/2021 1405   LYMPHSABS 21.1 (H) 10/21/2014 0914   MONOABS 0.7 08/29/2021 1405   MONOABS 0.7 10/21/2014 0914   EOSABS 0.0 08/29/2021 1405   EOSABS 0.1 10/21/2014 0914   BASOSABS 0.1 08/29/2021 1405   BASOSABS 0.1 10/21/2014 0914   Family Communication: none  Disposition: Status is: Inpatient Remains inpatient appropriate because: Ongoing medical evaluation and treatment requiring inpatient level of care   Planned Discharge Destination:  TBD  enoxaparin (LOVENOX) injection 40 mg Start: 08/29/21 2200 {Tip this will not be part of the note when signed  DVT Prophylaxis  ., Enoxaparin (lovenox) injection 40 mg       (Optional):26781}  Time spent: >60 minutes  Author: Richarda Osmond, MD 09/02/2021 7:03 AM  For on call review [email or web address].

## 2021-09-03 LAB — CULTURE, BLOOD (ROUTINE X 2)
Culture: NO GROWTH
Culture: NO GROWTH

## 2021-09-04 ENCOUNTER — Telehealth: Payer: Self-pay

## 2021-09-04 DIAGNOSIS — Z748 Other problems related to care provider dependency: Secondary | ICD-10-CM

## 2021-09-04 LAB — COMP PANEL: LEUKEMIA/LYMPHOMA: Immunophenotypic Profile: 66

## 2021-09-04 LAB — CYTOLOGY - NON PAP

## 2021-09-04 NOTE — Telephone Encounter (Signed)
Domenic Schwab RN with [facility] was at the patient's residence during call. They have faxed orders to request skilled nursing 1 time per week for 5 weeks with 2 PRN visits; also requesting orders for Clinical Social Worker to complete in home evaluation.   Verbal orders may also be given to Kim at [phone]

## 2021-09-04 NOTE — Telephone Encounter (Signed)
Transition Care Management Unsuccessful Follow-up Telephone Call  Date of discharge and from where:  09/02/21 [facility]  Attempts:  1st Attempt  Reason for unsuccessful TCM follow-up call:  Left voice message. Pt may reach me at [phone].

## 2021-09-04 NOTE — Telephone Encounter (Signed)
Transition Care Management Follow-up Telephone Call Date of discharge and from where: 09/02/21 [facility]  How have you been since you were released from the hospital? Pt states she is doing okay, feeling tired & weak Any questions or concerns? No  Items Reviewed: Did the pt receive and understand the discharge instructions provided? Yes  Medications obtained and verified? Yes  Other? No  Any new allergies since your discharge? No  Dietary orders reviewed? Yes Do you have support at home? Yes   Home Care and Equipment/Supplies: Were home health services ordered? yes If so, what is the name of the agency? [facility]  Has the agency set up a time to come to the patient's home? yes Were any new equipment or medical supplies ordered?  Yes: oxygen What is the name of the medical supply agency? [facility] Were you able to get the supplies/equipment? yes Do you have any questions related to the use of the equipment or supplies? No  Functional Questionnaire: (I = Independent and D = Dependent) ADLs: I  Bathing/Dressing- I  Meal Prep- i  Eating- i  Maintaining continence- I  Transferring/Ambulation- I  Managing Meds- I  Follow up appointments reviewed:  PCP Hospital f/u appt confirmed? Yes  Scheduled to see Dr. Ronnald Ramp on 09/11/21 @ 4:00. [facility] f/u appt confirmed? Yes  Scheduled to see Dr. Janese Banks on 09/11/21. Are transportation arrangements needed? Yes  - [employer] referral sent for transportation If their condition worsens, is the pt aware to call PCP or go to the Emergency Dept.? Yes Was the patient provided with contact information for the PCP's office or ED? Yes Was to pt encouraged to call back with questions or concerns? Yes

## 2021-09-05 ENCOUNTER — Encounter: Payer: Self-pay | Admitting: Oncology

## 2021-09-05 LAB — SURGICAL PATHOLOGY

## 2021-09-06 ENCOUNTER — Encounter: Payer: Self-pay | Admitting: Oncology

## 2021-09-06 ENCOUNTER — Inpatient Hospital Stay (HOSPITAL_BASED_OUTPATIENT_CLINIC_OR_DEPARTMENT_OTHER): Payer: Medicare Other | Admitting: Oncology

## 2021-09-06 ENCOUNTER — Telehealth: Payer: Self-pay | Admitting: *Deleted

## 2021-09-06 ENCOUNTER — Other Ambulatory Visit: Payer: Self-pay

## 2021-09-06 ENCOUNTER — Inpatient Hospital Stay: Payer: Medicare Other | Attending: Oncology

## 2021-09-06 ENCOUNTER — Other Ambulatory Visit: Payer: Self-pay | Admitting: *Deleted

## 2021-09-06 VITALS — BP 96/73 | HR 106 | Temp 98.4°F | Resp 18 | Ht 65.0 in | Wt 113.0 lb

## 2021-09-06 DIAGNOSIS — I4891 Unspecified atrial fibrillation: Secondary | ICD-10-CM | POA: Diagnosis not present

## 2021-09-06 DIAGNOSIS — I083 Combined rheumatic disorders of mitral, aortic and tricuspid valves: Secondary | ICD-10-CM | POA: Insufficient documentation

## 2021-09-06 DIAGNOSIS — R161 Splenomegaly, not elsewhere classified: Secondary | ICD-10-CM | POA: Diagnosis not present

## 2021-09-06 DIAGNOSIS — C778 Secondary and unspecified malignant neoplasm of lymph nodes of multiple regions: Secondary | ICD-10-CM | POA: Insufficient documentation

## 2021-09-06 DIAGNOSIS — C3490 Malignant neoplasm of unspecified part of unspecified bronchus or lung: Secondary | ICD-10-CM | POA: Insufficient documentation

## 2021-09-06 DIAGNOSIS — R54 Age-related physical debility: Secondary | ICD-10-CM | POA: Diagnosis not present

## 2021-09-06 DIAGNOSIS — Z79899 Other long term (current) drug therapy: Secondary | ICD-10-CM | POA: Insufficient documentation

## 2021-09-06 DIAGNOSIS — C9110 Chronic lymphocytic leukemia of B-cell type not having achieved remission: Secondary | ICD-10-CM | POA: Insufficient documentation

## 2021-09-06 DIAGNOSIS — M4316 Spondylolisthesis, lumbar region: Secondary | ICD-10-CM | POA: Diagnosis not present

## 2021-09-06 DIAGNOSIS — R531 Weakness: Secondary | ICD-10-CM | POA: Insufficient documentation

## 2021-09-06 DIAGNOSIS — R0602 Shortness of breath: Secondary | ICD-10-CM | POA: Diagnosis not present

## 2021-09-06 DIAGNOSIS — D696 Thrombocytopenia, unspecified: Secondary | ICD-10-CM | POA: Insufficient documentation

## 2021-09-06 DIAGNOSIS — M5136 Other intervertebral disc degeneration, lumbar region: Secondary | ICD-10-CM | POA: Insufficient documentation

## 2021-09-06 DIAGNOSIS — Z87891 Personal history of nicotine dependence: Secondary | ICD-10-CM | POA: Diagnosis not present

## 2021-09-06 DIAGNOSIS — R0600 Dyspnea, unspecified: Secondary | ICD-10-CM

## 2021-09-06 DIAGNOSIS — Z7189 Other specified counseling: Secondary | ICD-10-CM

## 2021-09-06 DIAGNOSIS — C3491 Malignant neoplasm of unspecified part of right bronchus or lung: Secondary | ICD-10-CM | POA: Diagnosis not present

## 2021-09-06 DIAGNOSIS — R5383 Other fatigue: Secondary | ICD-10-CM | POA: Insufficient documentation

## 2021-09-06 DIAGNOSIS — I7143 Infrarenal abdominal aortic aneurysm, without rupture: Secondary | ICD-10-CM | POA: Insufficient documentation

## 2021-09-06 DIAGNOSIS — E538 Deficiency of other specified B group vitamins: Secondary | ICD-10-CM | POA: Diagnosis not present

## 2021-09-06 MED ORDER — MORPHINE SULFATE (CONCENTRATE) 10 MG /0.5 ML PO SOLN: 5.0000 mg | Freq: Four times a day (QID) | ORAL | 0 refills | Status: AC | PRN

## 2021-09-06 NOTE — Progress Notes (Signed)
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 Bone marrow on 03/2008 revealed 90% cellularity with diffuse involvement by CLL.  Karyotype was normal.  FISH studies on 08/19/2018 revealed loss of one ATM, homologous deletion of 13q, and normal CCDN1/IGH, chromosome 12, and TP53.She received FCR x 4 cycles (last 08/06/2008). She had a partial remission.  She received single agent Rituxan x 7.  Treatment was held secondary to neutropenia.   She received 6 cycles of Gazyva (07/26/2016 - 01/23/2017).  Patient was started on ibrutinib in September 2020.  Her peak WBC count was 393,000 and she was also significant at that time when she has had started ibrutinib.   She has some baseline thrombocytopenia with platelet counts fluctuating between 90s to 130s.  History of B12 deficiency for which she is on oral B12  Patient presented with worsening shortness of breath to the hospital in February 2023 and was found to have right-sided hilar lung massCausing compression of bronchus intermedius.  Large right pleural effusion.  Low-density structure in inferior right hepatic lobe concerning for liver metastases.  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  Tumor cells positive for CD56  synaptophysin and chromogranin and negative for CD3 and CD20.  Interval history-patient is currently on home oxygen.  She reports ongoing fatigue.  Unable to do much outside the house and she barely gets around the house without feeling fatigued.  Reports difficulty sleeping.  ECOG PS- 2 Pain scale- 0   Review of systems- Review of Systems  Constitutional:  Positive for malaise/fatigue. Negative for chills, fever and weight loss.  HENT:  Negative for congestion, ear discharge and nosebleeds.   Eyes:  Negative for blurred vision.  Respiratory:  Positive for shortness of breath. Negative for cough, hemoptysis, sputum production and wheezing.   Cardiovascular:  Negative for chest pain, palpitations, orthopnea and claudication.  Gastrointestinal:  Negative for abdominal pain, blood in stool, constipation, diarrhea, heartburn, melena, nausea and vomiting.  Genitourinary:  Negative for dysuria, flank pain, frequency, hematuria and urgency.  Musculoskeletal:  Negative for back pain, joint pain and myalgias.  Skin:  Negative for rash.  Neurological:  Negative for dizziness, tingling, focal weakness, seizures, weakness and headaches.  Endo/Heme/Allergies:  Does not bruise/bleed easily.  Psychiatric/Behavioral:  Negative for depression and suicidal ideas. The patient has insomnia.      No Known Allergies   Past Medical History:  Diagnosis Date   Anemia    CLL (chronic lymphocytic leukemia) ([place]) 2002   CLL (chronic lymphocytic leukemia) ([facility])    Itching    Vitamin B 12 deficiency      Past Surgical History:  Procedure Laterality Date   breeast mass removed  1988   benign    Social History   Socioeconomic History   Marital status: Divorced    Spouse name: Not on file   Number  of children: 2   Years of education: Not on file   Highest education level: 9th grade  Occupational History   Occupation: Retired  Tobacco Use   Smoking status: Former    Packs/day: 0.25    Years:  56.00    Pack years: 14.00    Types: Cigarettes    Quit date: 07/16/2021    Years since quitting: 0.1   Smokeless tobacco: Never  Vaping Use   Vaping Use: Never used  Substance and Sexual Activity   Alcohol use: Yes    Alcohol/week: 1.0 standard drink    Types: 1 Glasses of wine per week    Comment: occassional   Drug use: No   Sexual activity: Not Currently  Other Topics Concern   Not on file  Social History Narrative   Pt lives alone.    Social Determinants of Health   Financial Resource Strain: High Risk   Difficulty of Paying Living Expenses: Hard  Food Insecurity: No Food Insecurity   Worried About Charity fundraiser in the Last Year: Never true   Ran Out of Food in the Last Year: Never true  Transportation Needs: No Transportation Needs   Lack of Transportation (Medical): No   Lack of Transportation (Non-Medical): No  Physical Activity: Inactive   Days of Exercise per Week: 0 days   Minutes of Exercise per Session: 0 min  Stress: No Stress Concern Present   Feeling of Stress : Only a little  Social Connections: Socially Isolated   Frequency of Communication with Friends and Family: More than three times a week   Frequency of Social Gatherings with Friends and Family: Once a week   Attends Religious Services: Never   Marine scientist or Organizations: No   Attends Music therapist: Never   Marital Status: Divorced  Human resources officer Violence: Not At Risk   Fear of Current or Ex-Partner: No   Emotionally Abused: No   Physically Abused: No   Sexually Abused: No    Family History  Family history unknown: Yes     Current Outpatient Medications:    alendronate (FOSAMAX) 70 MG tablet, Take 1 tablet (70 mg total) by mouth every 7 (seven) days. Take with a full glass of water on an empty stomach., Disp: 12 tablet, Rfl: 1   amLODipine (NORVASC) 2.5 MG tablet, Take 1 tablet (2.5 mg total) by mouth daily., Disp: 90 tablet, Rfl: 1    amoxicillin-clavulanate (AUGMENTIN) 875-125 MG tablet, Take 1 tablet by mouth 2 (two) times daily for 7 days., Disp: 14 tablet, Rfl: 0   Calcium Carbonate-Vit D-Min (CALCIUM 1200) 1200-1000 MG-UNIT CHEW, Chew 1 tablet by mouth daily., Disp: 90 tablet, Rfl: 1   Cholecalciferol (VITAMIN D3) 20 MCG (800 UNIT) TABS, Take 1 tablet by mouth daily., Disp: 90 tablet, Rfl: 1   folic acid (FOLVITE) 093 MCG tablet, Take 800 mcg by mouth 3 (three) times a week. , Disp: , Rfl:    metoprolol tartrate (LOPRESSOR) 100 MG tablet, Take 1 tablet (100 mg total) by mouth 2 (two) times daily., Disp: 120 tablet, Rfl: 0   vitamin B-12 (CYANOCOBALAMIN) 1000 MCG tablet, Take 1,000 mcg by mouth. Takes three times a week, Disp: , Rfl:   Physical exam:  Vitals:   09/06/21 1326  BP: 96/73  Pulse: (!) 106  Resp: 18  Temp: 98.4 F (36.9 C)  TempSrc: Tympanic  SpO2: 94%  Weight: 113 lb (51.3 kg)  Height: 5' 5" (1.651 m)  Physical Exam Constitutional:      Comments: Elderly frail woman sitting in a wheelchair.  Appears fatigued  Cardiovascular:     Rate and Rhythm: Normal rate and regular rhythm.     Heart sounds: Normal heart sounds.  Pulmonary:     Effort: Pulmonary effort is normal.     Comments: Breath sounds decreased on the right side as compared to left Abdominal:     General: Bowel sounds are normal.     Palpations: Abdomen is soft.  Skin:    General: Skin is warm and dry.  Neurological:     Mental Status: She is alert and oriented to person, place, and time.     CMP Latest Ref Rng & Units 08/31/2021  Glucose 70 - 99 mg/dL 101(H)  BUN 8 - 23 mg/dL 17  Creatinine 0.44 - 1.00 mg/dL 0.48  Sodium 135 - 145 mmol/L 136  Potassium 3.5 - 5.1 mmol/L 4.4  Chloride 98 - 111 mmol/L 107  CO2 22 - 32 mmol/L 25  Calcium 8.9 - 10.3 mg/dL 8.3(L)  Total Protein 6.5 - 8.1 g/dL -  Total Bilirubin 0.3 - 1.2 mg/dL -  Alkaline Phos 38 - 126 U/L -  AST 15 - 41 U/L -  ALT 0 - 44 U/L -   CBC Latest Ref Rng & Units  09/02/2021  WBC 4.0 - 10.5 K/uL 28.3(H)  Hemoglobin 12.0 - 15.0 g/dL 10.4(L)  Hematocrit 36.0 - 46.0 % 34.6(L)  Platelets 150 - 400 K/uL 209    No images are attached to the encounter.  DG Lumbar Spine Complete  Result Date: 08/29/2021 CLINICAL DATA:  Back pain. EXAM: LUMBAR SPINE - COMPLETE 4+ VIEW COMPARISON:  None. FINDINGS: Mild lumbar scoliosis. Mild retrolisthesis L2-3. Otherwise normal alignment. Negative for fracture or mass. Atherosclerotic calcification abdominal aorta. Disc degeneration throughout the lumbar spine with disc space narrowing and spurring IMPRESSION: Multilevel lumbar spondylosis with scoliosis.  No acute abnormality. Electronically Signed   By: Franchot Gallo M.D.   On: 08/29/2021 13:46   CT Angio Chest PE W and/or Wo Contrast  Result Date: 08/29/2021 CLINICAL DATA:  Productive cough, shortness of breath EXAM: CT ANGIOGRAPHY CHEST WITH CONTRAST TECHNIQUE: Multidetector CT imaging of the chest was performed using the standard protocol during bolus administration of intravenous contrast. Multiplanar CT image reconstructions and MIPs were obtained to evaluate the vascular anatomy. RADIATION DOSE REDUCTION: This exam was performed according to the departmental dose-optimization program which includes automated exposure control, adjustment of the mA and/or kV according to patient size and/or use of iterative reconstruction technique. CONTRAST:  56m OMNIPAQUE IOHEXOL 350 MG/ML SOLN COMPARISON:  09/01/2008 FINDINGS: Cardiovascular: This is a technically adequate evaluation of the pulmonary vasculature. No filling defects or pulmonary emboli. The heart is unremarkable without pericardial effusion. Normal caliber of the thoracic aorta. Mild atherosclerosis of the aortic arch and coronary vasculature. Mediastinum/Nodes: Large soft tissue mass at the right hilum measures up to 3.7 x 3.6 cm, with near complete obstruction of the bronchus intermedius. Mediastinal lymphadenopathy is also  identified, measuring 1.8 cm in the precarinal station and up to 3 cm in the subcarinal region. Thyroid, trachea, and esophagus are otherwise unremarkable. Lungs/Pleura: Large right pleural effusion. The right hilar mass described above results in near complete obstruction of the bronchus intermedius as well as the right middle and right lower lobe bronchi, with dense right middle and right lower lobe consolidation consistent with postobstructive change. Background emphysema. No pneumothorax. The left chest is clear. Upper  Abdomen: No acute abnormality. Musculoskeletal: No acute or destructive bony lesions. Reconstructed images demonstrate no additional findings. Review of the MIP images confirms the above findings. IMPRESSION: 1. Large right hilar mass, with near complete obstruction of the bronchus intermedius, right middle lobe bronchus, and right lower lobe bronchus, consistent with malignancy. 2. Mediastinal lymphadenopathy consistent with nodal metastases. 3. Large right pleural effusion. 4. Dense consolidation within the right middle and right lower lobes with associated volume loss. Findings are compatible with postobstructive change. 5. No evidence of pulmonary embolus. 6.  Aortic Atherosclerosis (ICD10-I70.0). Electronically Signed   By: Randa Ngo M.D.   On: 08/29/2021 16:21   CT ABDOMEN PELVIS W CONTRAST  Result Date: 08/30/2021 CLINICAL DATA:  Abdominal pain. EXAM: CT ABDOMEN AND PELVIS WITH CONTRAST TECHNIQUE: Multidetector CT imaging of the abdomen and pelvis was performed using the standard protocol following bolus administration of intravenous contrast. RADIATION DOSE REDUCTION: This exam was performed according to the departmental dose-optimization program which includes automated exposure control, adjustment of the mA and/or kV according to patient size and/or use of iterative reconstruction technique. CONTRAST:  46m OMNIPAQUE IOHEXOL 300 MG/ML  SOLN COMPARISON:  02/13/2018 FINDINGS:  Lower chest: As on the recent CT of the chest from 08/29/2018. There is right hilar and subcarinal adenopathy. Interval decrease in volume of the right pleural effusion status post thoracentesis. Extensive postobstructive changes are identified within the right middle lobe and right lower lobe. Small left pleural effusion identified. Hepatobiliary: Ill-defined low-density lesion within the inferior right hepatic lobe measures 1.8 by 1.5 cm, image 42/2. This is a new finding compared with 02/13/2018 and is suspicious for liver metastases. Gallbladder appears partially decompressed. No signs of gallbladder wall inflammation or bile duct dilatation. Pancreas: Unremarkable. No pancreatic ductal dilatation or surrounding inflammatory changes. Spleen: Borderline enlarged spleen measures 11.3 cm in length. Calcified granulomas identified throughout the splenic parenchyma. Adrenals/Urinary Tract: Normal adrenal glands. Multiple small areas of cortical scarring noted within the right kidney. Left kidney appears normal. No signs of mass or hydronephrosis. The urinary bladder is unremarkable. Excreted contrast material is noted within the bladder lumen. Stomach/Bowel: Stomach appears nondistended. The appendix is visualized and appears within normal limits, image 45/2. No bowel wall thickening, inflammation, or distension. Vascular/Lymphatic: Aortic atherosclerosis. Infrarenal abdominal aortic aneurysm measures 3.6 cm, image 47/2. This is compared with 2.9 cm previously. No abdominopelvic adenopathy. Reproductive: The uterus and adnexal structures are unremarkable. Prominent bilateral parametrial veins identified which may reflect underlying pelvic venous congestion syndrome. Other: There is no free fluid or fluid collections. No signs of pneumoperitoneum. Musculoskeletal: The bones appear diffusely osteopenic. Multilevel degenerative disc disease is identified throughout the visualized thoracic and lumbar spine. No acute or  suspicious osseous findings. IMPRESSION: 1. No acute findings identified within the abdomen or pelvis. 2. There is a new low-density structure within the inferior right hepatic lobe worrisome for liver metastases. 3. Decrease in volume of right pleural effusion status post thoracentesis. 4. Infrarenal abdominal aortic aneurysm measuring 3.6 cm. Recommend follow-up every 2 years. Reference: J Am Coll Radiol 20947;[id number] Aortic aneurysm NOS (ICD10-I71.9). 5. Prominent bilateral parametrial veins which may reflect underlying pelvic venous congestion syndrome. 6. Aortic Atherosclerosis (ICD10-I70.0). Electronically Signed   By: TKerby MoorsM.D.   On: 08/30/2021 12:17   DG Chest Port 1 View  Result Date: 08/30/2021 CLINICAL DATA:  Status post right thoracentesis. EXAM: PORTABLE CHEST 1 VIEW COMPARISON:  August 29, 2021. FINDINGS: Decreased right pleural effusion, now small. Also, small left pleural effusion.  Improved right mid and lower lung opacities. No visible pneumothorax. Right hilar mass better characterized on recent CT chest. Similar cardiac silhouette. IMPRESSION: 1. Decreased right pleural effusion, now small. No visible pneumothorax. 2. Improved right mid and lower lung opacities. 3. Right hilar mass better characterized on recent CT chest. Electronically Signed   By: Margaretha Sheffield M.D.   On: 08/30/2021 10:13   DG Chest Portable 1 View  Result Date: 08/29/2021 CLINICAL DATA:  Chest pain short of breath EXAM: PORTABLE CHEST 1 VIEW COMPARISON:  05/07/2007.  Chest CT 09/26/2021 FINDINGS: Large area of increased density in the right base. This is due to a combination of pleural effusion and airspace disease. Left lung is clear. Heart seems mild scratch the heart size mildly enlarged. Vascularity normal. IMPRESSION: Extensive airspace density in the right lung base most consistent with effusion and airspace consolidation. Suspect underlying right lower lobe mass lesion as identified on CT chest  earlier today. Electronically Signed   By: Franchot Gallo M.D.   On: 08/29/2021 17:07   ECHOCARDIOGRAM COMPLETE  Result Date: 08/31/2021    ECHOCARDIOGRAM REPORT   Patient Name:   YATZARY MERRIWEATHER Boehning Date of Exam: 08/30/2021 Medical Rec #:  [id number]       Height:       65.0 in Accession #:    [id number]      Weight:       124.0 lb Date of Birth:  1941/01/14      BSA:          1.614 m Patient Age:    35 years        BP:           122/82 mmHg Patient Gender: F               HR:           106 bpm. Exam Location:  [facility] Procedure: 2D Echo, Cardiac Doppler and Color Doppler Indications:     Atrial Fibrillation I48.91  History:         Patient has no prior history of Echocardiogram examinations.                  Anemia.  Sonographer:     Sherrie Sport Referring Phys:  [place] Diagnosing Phys: Serafina Royals MD  Sonographer Comments: Suboptimal apical window. IMPRESSIONS  1. Left ventricular ejection fraction, by estimation, is 60 to 65%. The left ventricle has normal function. The left ventricle has no regional wall motion abnormalities. Left ventricular diastolic function could not be evaluated.  2. Right ventricular systolic function is normal. The right ventricular size is normal.  3. Left atrial size was moderately dilated.  4. Right atrial size was moderately dilated.  5. The mitral valve is normal in structure. Mild mitral valve regurgitation.  6. The aortic valve is normal in structure. Aortic valve regurgitation is trivial. FINDINGS  Left Ventricle: Left ventricular ejection fraction, by estimation, is 60 to 65%. The left ventricle has normal function. The left ventricle has no regional wall motion abnormalities. The left ventricular internal cavity size was normal in size. There is  no left ventricular hypertrophy. Left ventricular diastolic function could not be evaluated. Right Ventricle: The right ventricular size is normal. No increase in right ventricular wall thickness. Right ventricular  systolic function is normal. Left Atrium: Left atrial size was moderately dilated. Right Atrium: Right atrial size was moderately dilated. Pericardium: There is no evidence of pericardial effusion. Mitral Valve:  The mitral valve is normal in structure. Mild mitral valve regurgitation. Tricuspid Valve: The tricuspid valve is normal in structure. Tricuspid valve regurgitation is mild. Aortic Valve: The aortic valve is normal in structure. Aortic valve regurgitation is trivial. Aortic valve mean gradient measures 3.0 mmHg. Aortic valve peak gradient measures 4.5 mmHg. Aortic valve area, by VTI measures 2.47 cm. Pulmonic Valve: The pulmonic valve was normal in structure. Pulmonic valve regurgitation is not visualized. Aorta: The aortic root and ascending aorta are structurally normal, with no evidence of dilitation. IAS/Shunts: No atrial level shunt detected by color flow Doppler.  LEFT VENTRICLE PLAX 2D LVIDd:         3.60 cm LVIDs:         2.60 cm LV PW:         1.20 cm LV IVS:        0.95 cm LVOT diam:     2.00 cm LV SV:         35 LV SV Index:   21 LVOT Area:     3.14 cm  RIGHT VENTRICLE RV Basal diam:  2.60 cm RV S prime:     17.10 cm/s TAPSE (M-mode): 2.1 cm LEFT ATRIUM             Index        RIGHT ATRIUM           Index LA diam:        3.50 cm 2.17 cm/m   RA Area:     18.10 cm LA Vol (A2C):   40.3 ml 24.96 ml/m  RA Volume:   54.90 ml  34.01 ml/m LA Vol (A4C):   39.9 ml 24.71 ml/m LA Biplane Vol: 40.0 ml 24.78 ml/m  AORTIC VALVE                    PULMONIC VALVE AV Area (Vmax):    2.26 cm     PV Vmax:        0.64 m/s AV Area (Vmean):   1.88 cm     PV Vmean:       42.000 cm/s AV Area (VTI):     2.47 cm     PV VTI:         0.077 m AV Vmax:           106.00 cm/s  PV Peak grad:   1.6 mmHg AV Vmean:          73.800 cm/s  PV Mean grad:   1.0 mmHg AV VTI:            0.140 m      RVOT Peak grad: 2 mmHg AV Peak Grad:      4.5 mmHg AV Mean Grad:      3.0 mmHg LVOT Vmax:         76.40 cm/s LVOT Vmean:         44.200 cm/s LVOT VTI:          0.110 m LVOT/AV VTI ratio: 0.79  AORTA Ao Root diam: 3.67 cm MITRAL VALVE               TRICUSPID VALVE MV Area (PHT): 5.06 cm    TR Peak grad:   34.1 mmHg MV Decel Time: 150 msec    TR Vmax:        292.00 cm/s MV E velocity: 85.70 cm/s  SHUNTS                            Systemic VTI:  0.11 m                            Systemic Diam: 2.00 cm                            Pulmonic VTI:  0.080 m Serafina Royals MD Electronically signed by Serafina Royals MD Signature Date/Time: 08/31/2021/12:12:10 PM    Final    [place] CORE BIOPSY (LYMPH NODES)  Result Date: 09/01/2021 INDICATION: Lymphadenopathy EXAM: Ultrasound-guided core needle biopsy of right supraclavicular lymph node MEDICATIONS: None. ANESTHESIA/SEDATION: Local analgesia FLUOROSCOPY: N/a COMPLICATIONS: None immediate. PROCEDURE: Informed written consent was obtained from the patient after a thorough discussion of the procedural risks, benefits and alternatives. All questions were addressed. Maximal Sterile Barrier Technique was utilized including caps, mask, sterile gowns, sterile gloves, sterile drape, hand hygiene and skin antiseptic. A timeout was performed prior to the initiation of the procedure. The patient was placed supine on the exam table. Limited ultrasound exam of the right neck and supraclavicular region demonstrated a couple of enlarged abnormal lymph nodes. Appropriate right supraclavicular lymph node was selected as biopsy target. Skin entry site was marked, the overlying skin was prepped and draped in the standard sterile fashion. Local analgesia was obtained with 1% lidocaine. Under ultrasound guidance, core needle biopsy of the target right supraclavicular lymph node was performed using an 18 gauge core biopsy device x6 passes. Specimens were submitted in saline to pathology for further handling. Limited postprocedure imaging demonstrated no hematoma or other complicating feature. A clean  dressing was placed after manual hemostasis. The patient tolerated the procedure well without immediate complication. IMPRESSION: Successful ultrasound-guided core needle biopsy of enlarged right supraclavicular lymph node. Electronically Signed   By: Albin Felling M.D.   On: 09/01/2021 09:58   US THORACENTESIS ASP PLEURAL SPACE W/IMG GUIDE  Result Date: 08/30/2021 INDICATION: Patient with a history of leukemia and new right hilar mass presents today with a right pleural effusion. Interventional radiology asked to perform a diagnostic and therapeutic thoracentesis. EXAM: ULTRASOUND GUIDED THORACENTESIS MEDICATIONS: 1% lidocaine 10 mL COMPLICATIONS: None immediate. PROCEDURE: An ultrasound guided thoracentesis was thoroughly discussed with the patient and questions answered. The benefits, risks, alternatives and complications were also discussed. The patient understands and wishes to proceed with the procedure. Written consent was obtained. Ultrasound was performed to localize and mark an adequate pocket of fluid in the right chest. The area was then prepped and draped in the normal sterile fashion. 1% Lidocaine was used for local anesthesia. Under ultrasound guidance a 6 Fr Safe-T-Centesis catheter was introduced. Thoracentesis was performed. The catheter was removed and a dressing applied. FINDINGS: A total of approximately 1950 mL of light red fluid was removed. Samples were sent to the laboratory as requested by the clinical team. IMPRESSION: Successful ultrasound guided right thoracentesis yielding 1950 mL of pleural fluid. Read by: Soyla Dryer, NP Electronically Signed   By: Albin Felling M.D.   On: 08/30/2021 11:22     Assessment and plan- Patient is a 81 y.o. 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  CT scan shows a 3.5 cm right hilar mass causing compression of bronchus intermedius associated with large right pleural effusion.  Also noted to have a right supraclavicular lymph node.  Both supraclavicular lymph node as well as cytology fromThoracentesis was positive for small cell lung cancer.  CT abdomen also showed possible liver metastases.  Explained to the patient that this constitutes stage IV disease.  Without treatment her overall prognosis is about 3 to 6 months.  With treatment prognosis could be up to a year to a year and a half.  Treatment will be palliative and not curative.  Patient is elderly and frail.  She lives with one of her sons who is often out of town for 2 to 3 weeks at a stretch.  She does not have any other close family or friends to take care of her.  She currently has home health services which would bring her meals for the next 2 weeks.  I explained the following options moving forward  Considering dose reduced carbo etoposide chemotherapy along with Tecentriq which will be given IV every 3 weeks for 4 cycles if tolerated followed by maintenance Tecentriq.  Discussed risks and benefits of chemotherapy including all but not limited to nausea, vomiting, low blood counts, risk of infections and hospitalizations.  Risk of immunotherapy associated side effects such as autoimmune colitis hepatitis pneumonitis and endocrinopathies. Foregoing systemic chemotherapy in proceeding with palliative radiation alone to the right lung mass to help with dyspnea Foregoing all treatment altogether and proceeding with best supportive care/hospice  Patient would like to think about all these options and get back to me in 2 days time.  She will be discussing it with her son further.  In the meanwhile I will give herPrescription for morphine liquid 5 mg every 4-6 hours as needed for her dyspnea.  I also asked her to take it once at night to see if it would help her  sleep   Cancer Staging  Small cell lung cancer [facility]) Staging form: Lung, AJCC 8th Edition - Clinical stage from 09/06/2021: Stage IVB (cT2, cN3, pM1c) - Signed by Sindy Guadeloupe, MD on 09/06/2021     Visit Diagnosis 1. Small cell lung cancer ([place])   2. Goals of care, counseling/discussion      Dr. Randa Evens, MD, MPH [facility] [phone] 09/06/2021 4:30 PM

## 2021-09-06 NOTE — Telephone Encounter (Signed)
° °  Telephone encounter was:  Successful.  09/06/2021 Name: Maureen [name] MRN: [id number] DOB: 07-16-1941  [name] is a 81 y.o. year old female who is a primary care patient of Juline Patch, MD . The community resource team was consulted for assistance with Transportation Needs   Care guide performed the following interventions: Patient provided with information about care guide support team and interviewed to confirm resource needs Follow up call placed to community resources to determine status of patients referral.Talked with patient she has schedule appts today but says transport is arranged she has [employer] transportation and she asked i call back tomorrow to see if she has nay new appointments to setup   Follow Up Plan:  Care guide will follow up with patient by phone over the next days  [place], Care Management  [phone] [address] , [place] [address] Email : [email or web address]

## 2021-09-07 ENCOUNTER — Telehealth: Payer: Self-pay | Admitting: *Deleted

## 2021-09-07 ENCOUNTER — Telehealth: Payer: Self-pay

## 2021-09-07 ENCOUNTER — Telehealth: Payer: Self-pay | Admitting: Family Medicine

## 2021-09-07 NOTE — Telephone Encounter (Signed)
Copied from [place] [id number]. Topic: General - Other >> Sep 07, 2021 11:52 AM Oneta Rack wrote: Colletta [place], OT from [facility] returning someone call please call back and ok to leave VM  [phone]

## 2021-09-07 NOTE — Telephone Encounter (Signed)
° °  Telephone encounter was:  Successful.  09/07/2021 Name: Maureen [name] MRN: [id number] DOB: 23-Jul-1940  Maureen [name] is a 81 y.o. year old female who is a primary care patient of Juline Patch, MD . The community resource team was consulted for assistance with [place] guide performed the following interventions: Patient provided with information about care guide support team and interviewed to confirm resource needs Follow up call placed to community resources to determine status of patients referral.  Follow Up Plan:  No further follow up planned at this time. The patient has been provided with needed resources.  [place], Care Management  [phone] [address] , [place] [address] Email : [email or web address]

## 2021-09-07 NOTE — Telephone Encounter (Signed)
I called Stephanie at [facility] to let her know to proceed with eval for OT on pt. [phone]

## 2021-09-07 NOTE — Telephone Encounter (Signed)
Called pt after receiving a message that pt declined OT from [facility]. I explained to pt what they would be coming out for. The pt knew she had PT coming out and thought they were the same. After talking to pt, she has agreed to let them eval her. I have a call to [facility] at home [facility].

## 2021-09-07 NOTE — Telephone Encounter (Signed)
[facility], states pt declined OT. Please call back for confirmation that she spoke with [place]

## 2021-09-08 ENCOUNTER — Telehealth: Payer: Self-pay

## 2021-09-08 ENCOUNTER — Telehealth: Payer: Self-pay | Admitting: *Deleted

## 2021-09-08 ENCOUNTER — Telehealth: Payer: Self-pay | Admitting: Family Medicine

## 2021-09-08 ENCOUNTER — Encounter: Payer: Self-pay | Admitting: *Deleted

## 2021-09-08 NOTE — Telephone Encounter (Signed)
Copied from [place] #[id number]. Topic: Quick Communication - Home Health Verbal Orders >> Sep 08, 2021 12:45 PM Pawlus, Brayton Layman A wrote: Caller/Agency: [facility]  Callback Number: [phone] - can leave VM. Requesting: PT Frequency: 2x4, 1x2

## 2021-09-08 NOTE — Telephone Encounter (Signed)
Called and left message with [facility] at [place] at [phone] to proceed with PT

## 2021-09-08 NOTE — Telephone Encounter (Signed)
Per. Judeen Hammans, pt's morphine requires PA  pa submitted via covermymeds. PAULINA Popko Key: [id number]   Your information has been submitted to [place]. [employer] [place] will review the request and notify you of the determination decision directly, typically within 3 business days of your submission and once all necessary information is received.  You will also receive your request decision electronically. To check for an update later, open the request again from your dashboard.  If [employer] [place] has not responded within the specified timeframe or if you have any questions about your PA submission, contact [place] [employer] directly at [facility]) [phone] or ([place]) [phone].

## 2021-09-08 NOTE — Telephone Encounter (Signed)
Called patient inquiring about treatment plans, per patient she has a little fight left and would like to start tecentriq. I informed her, scheduler will call her with appointment dates and times ( along with new chemo class). She will need transportation set up. Also she says that the Morphine was approved, called received from [facility]. No further concerns.

## 2021-09-09 LAB — CHOLESTEROL, BODY FLUID: Cholesterol, Fluid: 82 mg/dL

## 2021-09-09 NOTE — Telephone Encounter (Signed)
Maureen [name]- she will see me sometime next week to start carbo/vp 16 and tecentriq is she is agreeable. We could do Friday, Monday and Tuesday.  Maureen [name]- can you arrange home palliative care for her and follow her closely as well. She is frail with little to no support

## 2021-09-11 ENCOUNTER — Other Ambulatory Visit: Payer: Self-pay | Admitting: Hospice and Palliative Medicine

## 2021-09-11 ENCOUNTER — Other Ambulatory Visit: Payer: Self-pay | Admitting: Oncology

## 2021-09-11 ENCOUNTER — Inpatient Hospital Stay: Payer: Medicare Other | Admitting: Oncology

## 2021-09-11 ENCOUNTER — Telehealth: Payer: Self-pay | Admitting: Family Medicine

## 2021-09-11 ENCOUNTER — Other Ambulatory Visit: Payer: Self-pay

## 2021-09-11 ENCOUNTER — Telehealth (INDEPENDENT_AMBULATORY_CARE_PROVIDER_SITE_OTHER): Payer: Medicare Other | Admitting: Family Medicine

## 2021-09-11 VITALS — HR 106

## 2021-09-11 DIAGNOSIS — R609 Edema, unspecified: Secondary | ICD-10-CM

## 2021-09-11 DIAGNOSIS — C3490 Malignant neoplasm of unspecified part of unspecified bronchus or lung: Secondary | ICD-10-CM

## 2021-09-11 DIAGNOSIS — Z09 Encounter for follow-up examination after completed treatment for conditions other than malignant neoplasm: Secondary | ICD-10-CM

## 2021-09-11 MED ORDER — LIDOCAINE-PRILOCAINE 2.5-2.5 % EX CREA: TOPICAL_CREAM | CUTANEOUS | 3 refills | Status: AC

## 2021-09-11 MED ORDER — PROCHLORPERAZINE MALEATE 10 MG PO TABS: 10.0000 mg | ORAL_TABLET | Freq: Four times a day (QID) | ORAL | 1 refills | Status: AC | PRN

## 2021-09-11 MED ORDER — ONDANSETRON HCL 8 MG PO TABS: 8.0000 mg | ORAL_TABLET | Freq: Two times a day (BID) | ORAL | 1 refills | Status: AC | PRN

## 2021-09-11 MED ORDER — LORAZEPAM 0.5 MG PO TABS: 0.5000 mg | ORAL_TABLET | Freq: Four times a day (QID) | ORAL | 0 refills | Status: AC | PRN

## 2021-09-11 NOTE — Progress Notes (Signed)
ON PATHWAY REGIMEN - Lymphoma and CLL  No Change  Continue With Treatment as Ordered.  Original Decision Date/Time: 07/03/2016 11:12     A cycle is every 28 days:     Obinutuzumab        Dose Mod: None     Obinutuzumab        Dose Mod: None     Obinutuzumab        Dose Mod: None     Obinutuzumab        Dose Mod: None     Chlorambucil        Dose Mod: None  **Always confirm dose/schedule in your pharmacy ordering system**  Patient Characteristics: Chronic Lymphocytic Leukemia (CLL), First Line, Treatment Indicated, 17p del (-), Frail Patient Disease Type: Chronic Lymphocytic Leukemia (CLL) Disease Type: Not Applicable Line of therapy: First Line RAI Stage: IV Treatment Indicated<= Treatment Indicated* 17p Deletion Status: Negative Fit or Frail* Patient<= Frail Patient* Intent of Therapy: Non-Curative / Palliative Intent, Discussed with Patient

## 2021-09-11 NOTE — Progress Notes (Signed)
Date:  09/11/2021   Name:  Maureen [name]   DOB:  02-23-1941   MRN:  [id number]   Chief Complaint: Follow-up  Patient is a 81 year old female who presents for a follow-up hospitalization for diagnosis of new cancer of small cell carcinoma.  Edema secondary to likely dependency.  . The patient reports the following problems: Dependency edema.. Health maintenance has been reviewed up-to-date.  I connected with  Kyna Blahnik Liberati on 09/11/21 by a video enabled telemedicine application and verified that I am speaking with the correct person using two identifiers.   I Army Fossa, MD from my office discussed the limitations of evaluation and management by telemedicine. The patient from her home expressed understanding and agreed to proceed.  Patient does not have transportation means and is unable to read turn to the clinic for follow-up having just recently been discharged from the hospital.  Other than fatigue and edema patient is doing well.     Leg Pain  Incident onset: for bilateral leg swelling. The incident occurred at home. There was no injury mechanism (not sleeping in bed/legs remain in dependent position). Pain location: left and right lower extremeties. The pain is mild. The pain has been Intermittent since onset. Nothing aggravates the symptoms.   Lab Results  Component Value Date   NA 136 08/31/2021   K 4.4 08/31/2021   CO2 25 08/31/2021   GLUCOSE 101 (H) 08/31/2021   BUN 17 08/31/2021   CREATININE 0.48 08/31/2021   CALCIUM 8.3 (L) 08/31/2021   GFRNONAA >60 08/31/2021   No results found for: CHOL, HDL, LDLCALC, LDLDIRECT, TRIG, CHOLHDL Lab Results  Component Value Date   TSH 1.713 03/25/2019   No results found for: HGBA1C Lab Results  Component Value Date   WBC 28.3 (H) 09/02/2021   HGB 10.4 (L) 09/02/2021   HCT 34.6 (L) 09/02/2021   MCV 79.2 (L) 09/02/2021   PLT 209 09/02/2021   Lab Results  Component Value Date   ALT 12 08/29/2021   AST 21 08/29/2021    ALKPHOS 55 08/29/2021   BILITOT 1.0 08/29/2021   No results found for: 25OHVITD2, 25OHVITD3, VD25OH   Review of Systems  Constitutional:  Positive for activity change, appetite change and fatigue. Negative for fever.  Respiratory:  Positive for shortness of breath. Negative for cough and wheezing.   Cardiovascular:  Negative for chest pain, palpitations and leg swelling.       Lower edema   Patient Active Problem List   Diagnosis Date Noted   Small cell lung cancer ([place]) 09/06/2021   S/P thoracentesis    SIRS (systemic inflammatory response syndrome) (HCC)    Acute respiratory failure with hypoxia ([place]) 08/30/2021   Mass of upper lobe of right lung 08/30/2021   Malnutrition of moderate degree 08/30/2021   Acute midline low back pain without sciatica    Pleural effusion    SOB (shortness of breath)    Hx of primary hypertension    Hilar mass 08/29/2021   Osteoporosis 06/13/2021   Paronychia of third toe of right foot 02/24/2020   Injury of right toe, initial encounter 02/24/2020   Thrombocytopenia ([place]) 10/20/2019   Hyperkalemia 03/24/2019   Anemia 03/24/2019   Goals of care, counseling/discussion 03/24/2019   Skin lesions 02/01/2019   Abnormal iron saturation 10/22/2018   B12 deficiency 09/24/2018   Renal mass, right 10/22/2017   Chemotherapy-induced neutropenia ([place]) 10/14/2017   CLL (chronic lymphocytic leukemia) ([place]) 02/28/2016    No  Known Allergies  Past Surgical History:  Procedure Laterality Date   breeast mass removed  1988   benign    Social History   Tobacco Use   Smoking status: Former    Packs/day: 0.25    Years: 56.00    Pack years: 14.00    Types: Cigarettes    Quit date: 07/16/2021    Years since quitting: 0.1   Smokeless tobacco: Never  Vaping Use   Vaping Use: Never used  Substance Use Topics   Alcohol use: Yes    Alcohol/week: 1.0 standard drink    Types: 1 Glasses of wine per week    Comment: occassional   Drug use: No      Medication list has been reviewed and updated.  Current Meds  Medication Sig   alendronate (FOSAMAX) 70 MG tablet Take 1 tablet (70 mg total) by mouth every 7 (seven) days. Take with a full glass of water on an empty stomach.   amLODipine (NORVASC) 2.5 MG tablet Take 1 tablet (2.5 mg total) by mouth daily.   Calcium Carbonate-Vit D-Min (CALCIUM 1200) 1200-1000 MG-UNIT CHEW Chew 1 tablet by mouth daily.   Cholecalciferol (VITAMIN D3) 20 MCG (800 UNIT) TABS Take 1 tablet by mouth daily.   folic acid (FOLVITE) 361 MCG tablet Take 800 mcg by mouth 3 (three) times a week.    lidocaine-prilocaine (EMLA) cream Apply to affected area once   LORazepam (ATIVAN) 0.5 MG tablet Take 1 tablet (0.5 mg total) by mouth every 6 (six) hours as needed (Nausea or vomiting).   metoprolol tartrate (LOPRESSOR) 100 MG tablet Take 1 tablet (100 mg total) by mouth 2 (two) times daily.   Morphine Sulfate (MORPHINE CONCENTRATE) 10 mg / 0.5 ml concentrated solution Take 0.25 mLs (5 mg total) by mouth every 6 (six) hours as needed for severe pain.   ondansetron (ZOFRAN) 8 MG tablet Take 1 tablet (8 mg total) by mouth 2 (two) times daily as needed for refractory nausea / vomiting. Start on day 3 after carboplatin chemo.   prochlorperazine (COMPAZINE) 10 MG tablet Take 1 tablet (10 mg total) by mouth every 6 (six) hours as needed (Nausea or vomiting).   vitamin B-12 (CYANOCOBALAMIN) 1000 MCG tablet Take 1,000 mcg by mouth. Takes three times a week    PHQ 2/9 Scores 09/11/2021 05/12/2021 11/09/2020 10/26/2020  PHQ - 2 Score 0 0 0 0  PHQ- 9 Score 0 0 - 0    GAD 7 : Generalized Anxiety Score 09/11/2021 05/12/2021 10/26/2020  Nervous, Anxious, on Edge 0 0 0  Control/stop worrying 0 0 0  Worry too much - different things 0 0 0  Trouble relaxing 0 0 0  Restless 0 0 0  Easily annoyed or irritable 0 0 0  Afraid - awful might happen 0 0 0  Total GAD 7 Score 0 0 0  Anxiety Difficulty Not difficult at all - -    BP  Readings from Last 3 Encounters:  09/06/21 96/73  09/02/21 117/72  08/29/21 (!) 142/86    Physical Exam Vitals and nursing note reviewed.  Constitutional:      Comments: Not available for examination due to inability to transport to clinic.    Wt Readings from Last 3 Encounters:  09/06/21 113 lb (51.3 kg)  08/29/21 124 lb (56.2 kg)  08/29/21 108 lb (49 kg)    Pulse (!) 106    SpO2 95%   Assessment and Plan:  1. Hospital discharge follow-up New onset.  Persistent.  Currently stable.  Patient is follow-up from recent diagnosis of small cell carcinoma upon hospital admission last week.  Patient is doing well but is somewhat fatigued and is having some issues with edema.  Patient is up good spirits and will be undergoing chemotherapy the latter part of the week and we are all optimistic.  2. Dependent edema New onset.  Persistent.  Gradually and increasing.  This is a combination that the patient is not sleeping in bed at night which would put the legs at the level of the heart and during the day she is sitting with her feet in a dependent position.  Patient's been encouraged to return to sleeping in a bed to mobilize the fluid from her legs as well as propping up her legs when she is sitting at home.  Patient has an understanding and will do so and if continues neck step will be to wrap the legs aware that we do not have increasing edema to possible cutaneous breakdown.  I spent 10 minutes with this patient, More than 50% of that time was spent in face to face education, counseling and care coordination.

## 2021-09-11 NOTE — Telephone Encounter (Signed)
Called pt and wanted to make sure she knows that the treatment is tecentriq, [place] and vp16. She had chemo back in 2018 and does not want to go to chemo class again. She is good to start on Friday as long as she has transportation. Her appt for transportation is between 7:45 to 8 am, then will come to get labs, see md and then get chemo. Then she will also gt vp 16 on mon. And tues. She is fine as long as has transportation.

## 2021-09-11 NOTE — Telephone Encounter (Signed)
Maureen [name] told me the dates of Friday with arrival 7:45 to 8 am for the transportation and then she will get labs, see Dr. Janese Banks and then start treatment. Pt agreement for the date and time Friday and I told her when she gets here then we will give her the other appts for mon. And tues of the next week.

## 2021-09-11 NOTE — Progress Notes (Signed)
START ON PATHWAY REGIMEN - Small Cell Lung     Cycles 1 through 4, every 21 days:     Atezolizumab      Carboplatin      Etoposide    Cycles 5 and beyond, every 21 days:     Atezolizumab   **Always confirm dose/schedule in your pharmacy ordering system**  Patient Characteristics: Newly Diagnosed, Preoperative or Nonsurgical Candidate (Clinical Staging), First Line, Extensive Stage Therapeutic Status: Newly Diagnosed, Preoperative or Nonsurgical Candidate (Clinical Staging) AJCC T Category: cT2 AJCC N Category: cN3 AJCC M Category: cM1c AJCC 8 Stage Grouping: IVB Stage Classification: Extensive  Intent of Therapy: Non-Curative / Palliative Intent, Discussed with Patient

## 2021-09-11 NOTE — Telephone Encounter (Signed)
[facility] with [facility] called saying she went to visit patient today and both of her feet are swollen.  Patient has also been constipated  Please advise  CB#  [phone]

## 2021-09-13 ENCOUNTER — Telehealth: Payer: Self-pay

## 2021-09-13 ENCOUNTER — Telehealth: Payer: Self-pay | Admitting: Primary Care

## 2021-09-13 NOTE — Telephone Encounter (Signed)
Copied from [place] [id number]. Topic: Quick Communication - Home Health Verbal Orders ?>> Sep 13, 2021  3:21 PM McGill, Nelva Bush wrote: ?Caller/Agency: [facility] ?Callback Number: [phone] ?Requesting OT/PT/Skilled Nursing/Social Work/Speech Therapy: OT ?Frequency: 1w2,2w2 ?

## 2021-09-13 NOTE — Telephone Encounter (Signed)
Spoke with patient regarding the Palliative referral/services and answered all her questions and she was in agreement with scheduling visit.  I have scheduled an In-home Consult for 10/07/2021 @ 12:30 PM ?

## 2021-09-14 ENCOUNTER — Other Ambulatory Visit: Payer: Self-pay | Admitting: Primary Care

## 2021-09-14 ENCOUNTER — Other Ambulatory Visit: Payer: Self-pay

## 2021-09-15 ENCOUNTER — Encounter: Payer: Medicare Other | Admitting: Hospice and Palliative Medicine

## 2021-09-15 ENCOUNTER — Ambulatory Visit: Payer: Medicare Other | Admitting: Oncology

## 2021-09-15 ENCOUNTER — Other Ambulatory Visit: Payer: Medicare Other

## 2021-09-15 ENCOUNTER — Ambulatory Visit: Payer: Medicare Other

## 2021-09-18 ENCOUNTER — Ambulatory Visit: Payer: Medicare Other

## 2021-09-19 ENCOUNTER — Ambulatory Visit: Payer: Medicare Other

## 2021-09-21 LAB — MISC LABCORP TEST (SEND OUT): Labcorp test code: 5367

## 2021-09-25 ENCOUNTER — Other Ambulatory Visit (HOSPITAL_COMMUNITY): Payer: Self-pay

## 2021-09-28 LAB — FUNGAL ORGANISM REFLEX

## 2021-09-28 LAB — FUNGUS CULTURE RESULT

## 2021-09-28 LAB — FUNGUS CULTURE WITH STAIN

## 2021-10-11 ENCOUNTER — Inpatient Hospital Stay: Payer: Medicare Other | Admitting: Oncology

## 2021-10-11 ENCOUNTER — Other Ambulatory Visit: Payer: PPO

## 2021-10-14 ENCOUNTER — Other Ambulatory Visit (HOSPITAL_COMMUNITY): Payer: Self-pay

## 2021-10-14 NOTE — Telephone Encounter (Signed)
Pt deceased. ? ?KP ?

## 2021-10-14 DEATH — deceased

## 2021-10-15 LAB — ACID FAST CULTURE WITH REFLEXED SENSITIVITIES (MYCOBACTERIA): Acid Fast Culture: NEGATIVE

## 2021-10-16 ENCOUNTER — Other Ambulatory Visit (HOSPITAL_COMMUNITY): Payer: Self-pay

## 2021-10-20 ENCOUNTER — Other Ambulatory Visit (HOSPITAL_COMMUNITY): Payer: Self-pay

## 2021-11-10 ENCOUNTER — Ambulatory Visit: Payer: PPO | Admitting: Family Medicine

## 2021-11-13 ENCOUNTER — Ambulatory Visit: Payer: PPO

## 2022-01-25 NOTE — Telephone Encounter (Signed)
Signing encounter see previous note 08/15/20
